# Patient Record
Sex: Male | Born: 1935 | Race: White | Hispanic: No | Marital: Married | Smoking: Never smoker
Health system: Southern US, Community
[De-identification: ages and names within clinical notes are randomized; demographics above are authoritative.]

## PROBLEM LIST (undated history)

## (undated) DIAGNOSIS — F0390 Unspecified dementia without behavioral disturbance: Secondary | ICD-10-CM

## (undated) DIAGNOSIS — J189 Pneumonia, unspecified organism: Secondary | ICD-10-CM

## (undated) DIAGNOSIS — I5022 Chronic systolic (congestive) heart failure: Secondary | ICD-10-CM

## (undated) DIAGNOSIS — I447 Left bundle-branch block, unspecified: Secondary | ICD-10-CM

## (undated) DIAGNOSIS — I4891 Unspecified atrial fibrillation: Secondary | ICD-10-CM

## (undated) HISTORY — DX: Pneumonia, unspecified organism: J18.9

## (undated) HISTORY — DX: Left bundle-branch block, unspecified: I44.7

## (undated) HISTORY — PX: AORTIC VALVE REPLACEMENT: SHX41

## (undated) HISTORY — PX: DOPPLER ECHOCARDIOGRAPHY: SHX263

---

## 2000-01-20 ENCOUNTER — Encounter: Payer: Self-pay | Admitting: Interventional Cardiology

## 2000-01-20 ENCOUNTER — Encounter: Admission: RE | Admit: 2000-01-20 | Discharge: 2000-01-20 | Payer: Self-pay | Admitting: Interventional Cardiology

## 2001-06-02 ENCOUNTER — Encounter: Admission: RE | Admit: 2001-06-02 | Discharge: 2001-06-02 | Payer: Self-pay | Admitting: Internal Medicine

## 2001-06-02 ENCOUNTER — Encounter: Payer: Self-pay | Admitting: Internal Medicine

## 2001-10-17 ENCOUNTER — Ambulatory Visit (HOSPITAL_COMMUNITY): Admission: RE | Admit: 2001-10-17 | Discharge: 2001-10-17 | Payer: Self-pay | Admitting: Chiropractor

## 2001-10-17 ENCOUNTER — Encounter: Payer: Self-pay | Admitting: Chiropractor

## 2003-01-25 ENCOUNTER — Ambulatory Visit (HOSPITAL_COMMUNITY): Admission: RE | Admit: 2003-01-25 | Discharge: 2003-01-25 | Payer: Self-pay | Admitting: Gastroenterology

## 2003-06-10 ENCOUNTER — Ambulatory Visit (HOSPITAL_COMMUNITY): Admission: RE | Admit: 2003-06-10 | Discharge: 2003-06-10 | Payer: Self-pay | Admitting: Internal Medicine

## 2004-12-19 ENCOUNTER — Ambulatory Visit (HOSPITAL_COMMUNITY): Admission: RE | Admit: 2004-12-19 | Discharge: 2004-12-19 | Payer: Self-pay | Admitting: Orthopedic Surgery

## 2005-12-24 ENCOUNTER — Ambulatory Visit (HOSPITAL_COMMUNITY): Admission: RE | Admit: 2005-12-24 | Discharge: 2005-12-24 | Payer: Self-pay | Admitting: Surgery

## 2005-12-24 ENCOUNTER — Encounter (INDEPENDENT_AMBULATORY_CARE_PROVIDER_SITE_OTHER): Payer: Self-pay | Admitting: Specialist

## 2006-06-14 ENCOUNTER — Inpatient Hospital Stay (HOSPITAL_COMMUNITY): Admission: EM | Admit: 2006-06-14 | Discharge: 2006-06-16 | Payer: Self-pay | Admitting: Emergency Medicine

## 2006-06-14 ENCOUNTER — Ambulatory Visit: Payer: Self-pay | Admitting: Internal Medicine

## 2006-06-15 ENCOUNTER — Encounter (INDEPENDENT_AMBULATORY_CARE_PROVIDER_SITE_OTHER): Payer: Self-pay | Admitting: Internal Medicine

## 2006-07-22 ENCOUNTER — Encounter: Admission: RE | Admit: 2006-07-22 | Discharge: 2006-07-22 | Payer: Self-pay | Admitting: Internal Medicine

## 2007-03-28 ENCOUNTER — Inpatient Hospital Stay (HOSPITAL_COMMUNITY): Admission: EM | Admit: 2007-03-28 | Discharge: 2007-04-02 | Payer: Self-pay | Admitting: Emergency Medicine

## 2007-03-28 ENCOUNTER — Ambulatory Visit: Payer: Self-pay | Admitting: Internal Medicine

## 2007-03-29 ENCOUNTER — Encounter (INDEPENDENT_AMBULATORY_CARE_PROVIDER_SITE_OTHER): Payer: Self-pay | Admitting: Cardiology

## 2007-04-19 ENCOUNTER — Ambulatory Visit: Payer: Self-pay

## 2007-05-03 ENCOUNTER — Encounter: Admission: RE | Admit: 2007-05-03 | Discharge: 2007-05-03 | Payer: Self-pay | Admitting: Internal Medicine

## 2008-05-23 ENCOUNTER — Encounter: Payer: Self-pay | Admitting: Internal Medicine

## 2008-11-22 ENCOUNTER — Encounter (INDEPENDENT_AMBULATORY_CARE_PROVIDER_SITE_OTHER): Payer: Self-pay | Admitting: *Deleted

## 2009-11-30 ENCOUNTER — Encounter (INDEPENDENT_AMBULATORY_CARE_PROVIDER_SITE_OTHER): Payer: Self-pay | Admitting: *Deleted

## 2010-02-05 NOTE — Letter (Signed)
Summary: Device-Delinquent Check  [facility], Main Office  [address]   [place], [place] [address]   Phone: [phone]  Fax: [phone]     November 30, 2009 MRN: [id number]   Jon Butler [address] [place], [place]  [address]   Dear Mr. Murad,  According to our records, you have not had your implanted device checked in the recommended period of time.  We are unable to determine appropriate device function without checking your device on a regular basis.  Please call our office to schedule an appointment, with Dr Ladona Ridgel,  as soon as possible.  If you are having your device checked by another physician, please call us so that we may update our records.  Thank you,  Letta Moynahan, EMT  November 30, 2009 12:21 PM  [facility]  certified

## 2010-05-21 NOTE — Cardiovascular Report (Signed)
Jon Butler, Jon Butler NO.:  [id number]   MEDICAL RECORD NO.:  [id number]          PATIENT TYPE:  INP   LOCATION:  3741                         FACILITY:  [facility]   PHYSICIAN:  Lyn Records, M.D.   DATE OF BIRTH:  06-Jul-1935   DATE OF PROCEDURE:  03/29/2007  DATE OF DISCHARGE:                            CARDIAC CATHETERIZATION   INDICATIONS FOR PROCEDURE:  Diagnostic left heart cath to document  coronary anatomy prior to insertion of biventricular pacer for  resynchronization and prophylactic AICD placement.  The patient  presented with clinical heart failure for the first time, known ejection  fraction 25%.   PROCEDURE PERFORMED:  Coronary angiography.   DESCRIPTION:  After informed consent a 6 French arterial sheath was  placed using a modified Seldinger technique.  A 6 French #4 left Judkins  catheter was used for left coronary angiography, a 6 [place] #4 right  Judkins catheter for right coronary angiography.  We paid special  attention to keep both the wire and the catheter tip above the  mechanical aortic valve.  In torquing of the right coronary catheter  there was selective engagement of the conus branch which had a separate  ostium.  We disengaged and then attempted cannulation of the right  coronary artery.  Upon injection which appeared to be of the aortic wall  we had rapid filling of the right coronary but there was an obvious  aortic wall contrast stain.  No flow disturbance into the conus branch  or the right coronary was noted.  The aortic root staining dissipated  within a minute or so of the contrast injection.  The patient  experienced no chest discomfort.  There was no contrast hang up in the  right coronary and over several minutes of observation in the cath lab  no change in rhythm or hemodynamics.  We awakened the patient and he was  not experiencing any chest pain.  We examined the patient and there were  no neurological complaints.   We  then turned our attention to the sheath and cath entry site.  A  sheathogram performed demonstrated adequate anatomy for percutaneous  closure.  We used Angio-Seal with good hemostasis.   RESULTS:  1. Hemodynamic data.      a.     Aortic pressure 111/71.      b.     Left ventricular pressure:  Not recorded.  2. Left ventriculography:  Not performed.  3. Coronary angiography.      a.     Left main coronary:  Widely patent.      b.     Left anterior descending coronary:  Heavy calcification in       the LAD with irregularities but the vessel is widely patent.  No       obstruction greater than 20%.  LAD wraps around the apex.  A small       to moderate-sized second diagonal arises from the LAD.  The LAD is       heavily calcified proximally.      c.     Ramus  intermedius branch:  Large ramus intermedius branch       arises from the distal left main and supplies the lateral wall.       This vessel is widely patent.      d.     Circumflex artery:  The circumflex coronary artery is       dominant and gives origin to three obtuse marginal branches.  The       first obtuse marginal branch is large and trifurcates.  The       proximal circumflex is also quite calcified.      e.     Right coronary:  The right coronary is nondominant.  There       is a separate ostium for the conus branch and for the right       coronary proper.  Our first pass at right coronary cannulation       resulted in selective engagement of the ramus branch.  We filled       this vessel with contrast but hand injection was not performed.       We then disengaged.  We next shot in what we thought was the right       coronary and ended up being a nonostial injection that resulted in       filling of the right coronary and staining of the periosteal       aortic wall around the right coronary os.  Flow into the right       coronary was brisk.  This actually cleared the periosteal aorta       within a minute or so after  the injection.  No symptoms were       associated, no ectopy was noted.  No EKG changes were noted.   CONCLUSION:  1. Widely patent left coronary and right coronary system.  2. Right coronary angiography was complicated by probable aortic root      and aortic root staining with right coronary injection without      hemodynamic compromise or evidence of decreased flow into the right      coronary.   PLAN:  Proceed with planned AICD and BiV pacer per Dr. Ladona Ridgel.  Resume  heparin in about 3 hours.      Lyn Records, M.D.  Electronically Signed     HWS/MEDQ  D:  03/31/2007  T:  03/31/2007  Job:  [id number]   cc:   Macarthur Critchley. Shelva Majestic, M.D.

## 2010-05-21 NOTE — H&P (Signed)
Jon Butler NO.:  [id number]   MEDICAL RECORD NO.:  [id number]          PATIENT TYPE:  EMS   LOCATION:  MAJO                         FACILITY:  [facility]   PHYSICIAN:  Francisca December, M.D.  DATE OF BIRTH:  1936/01/06   DATE OF ADMISSION:  03/28/2007  DATE OF DISCHARGE:                              HISTORY & PHYSICAL   REASON FOR ADMISSION:  Shortness of breath.   HISTORY OF PRESENT ILLNESS:  Jon Butler is a 75 year old man with  a long cardiac history dating to 36 when he underwent an aortic valve  replacement with a St. Jude mechanical device and  aortic root  replacement felt secondary to Marfan syndrome.  This was performed at  [facility]  He is on chronic Coumadin, and his last known LVEF was  25-35%, date of that is unknown.  Etiology is felt to be nonischemic.  He presents today with his wife and son complaining of 3-4 weeks of  progressive dyspnea, very much worse in the last 24 hours.  He had  paroxysmal nocturnal dyspnea last night, that is, he was awakened from  sleep with shortness of breath and had to sit upright.  He took a large  measure of cayenne pepper which seemed to improve things somewhat.  However, because of persistence of his symptoms worsening over several  weeks and chest discomfort, described below, he allowed his family to  bring him to the emergency room.   He also has a left-sided lower chest, sharp, aching pain which he refers  to as indigestion.  He has taken multiple doses of antacids for this  with partial relief at times.  He has no central or substernal  discomfort.  He has no pyrosis and no difficulty swallowing.   PAST MEDICAL HISTORY:  1. Long-term Coumadin use.  2. Chronic left bundle branch block.  3. History of cardiomyopathy, as mentioned.  4. Aortic valve replacement with a St Jude.   CURRENT MEDICATIONS:  1. Adjusted dose warfarin.  2. Captopril 50 mg p.o. t.i.d.  3. Carvedilol 25 mg p.o.  b.i.d.  4. Multiple vitamins daily.  5. Aspirin 81 mg p.o. daily.  6. Prescribed Lasix but has taken himself off.   ALLERGIES:  NO KNOWN DRUG ALLERGIES.   SOCIAL HISTORY:  He has 2-3 glasses of wine daily.  No tobacco use.  His  son is a International aid/development worker here at [facility].  He is married and accompanied by his wife as well in the  emergency room.   FAMILY HISTORY:  Mother died of brain aneurysm.  Father died of a  myocardial infarction at age 76.  Sister recently died suddenly as well.   REVIEW OF SYSTEMS:  Systems negative, except as mentioned above.   PHYSICAL EXAMINATION:  VITAL SIGNS:  Blood pressure 171/105, pulse is 72  and regular but with frequent extrasystole secondary to PAC, temperature  98.4, respiratory rate 18.  GENERAL:  A reasonably well-appearing 75 year old Caucasian man in no  distress.  HEENT:  Unremarkable.  Head is atraumatic, normocephalic.  Pupils equal,  round and reactive to light.  Extraocular movements are intact.  Sclerae  are anicteric.  Oral mucosa is pink and moist.  Teeth and gums in good  repair.  Tongue is not coated.  NECK:  Supple without thyromegaly or masses.  The carotid upstrokes are  normal.  There is no bruit.  I do not see JVD sitting upright.  CHEST:  Decreased breath sounds of bases with rhonchi in the left base.  No rales, adequate air movement.  HEART:  Regular rhythm with frequent extrasystole secondary to PAC.  There is a prominent S2 but no clear valve click.  Soft ejection  systolic murmur also present.  No gallop.  ABDOMEN:  Soft, nontender, even on deep palpation.  Bowel sounds present  in all quadrants.  Left chest is nontender.  EXTREMITIES:  Full range of motion.  No edema.  The right foot is  somewhat cooler and a bit dusky; however, there is an intact pulse,  posterior tibial bilaterally.  NEUROLOGIC:  Cranial nerves II-XII  intact.  Motor and sensory grossly intact.  Gait not tested.   SKIN:  Warm and dry.   LABORATORY DATA:  Serum sodium of 130, potassium of 4.9, CO2 of 25,  chloride 98, blood sugar 188, creatinine 0.9, BUN 14.  Admission  hemogram is normal.  INR is 4.4.  Cardiac enzymes, point-of-care x2 are  negative.   Chest x-ray shows cardiomegaly and moderate CHF.  EKG is chronic left  bundle branch block, sinus rhythm.   ASSESSMENT:  1. Dyspnea, likely acute-on-chronic systolic heart failure,      decompensated.  2. Cardiomyopathy ?, nonischemic versus ischemic.  No history of      coronary artery disease per patient.  3. Noncardiac chest pain.  4. Aortic valve replacement on Coumadin.  5. Chronic left bundle branch block.  6. Mild Coumadin toxicity.  7. Elevated fasting blood sugar.  8. Elevated blood pressure here in the emergency room.   PLAN:  1. The patient is admitted for telemetry monitoring and rule out      myocardial infarction which seems unlikely given the two negative      point-of-care enzymes thus far.  2. Will begin furosemide IV 40 mg q.12h.  3. Daily BMET and BNP.  4. Hold Coumadin.  5. Daily PT/INR.  6. 2-D echocardiogram with Doppler study.  7. Fasting lipids, glucose and hemoglobin A1c as well as TSH in the      a.m.  8. Repeat chest x-ray in 48 hours.  9. Strict I&O's, daily weights, 1500 mL per 24 hour fluid restriction  10.Jon Butler has a long history of a fair degree of denial with      regard to his cardiac diagnoses and problems.  He      has been encouraged to consider placement of an ICD with      biventricular pacemaker in the past.  Per his son, he has at least      class II exertional dyspnea.  The patient has declined this therapy      in the past but apparently is more amenable to the possibility at      the time of this admission.      Francisca December, M.D.  Electronically Signed     JHE/MEDQ  D:  03/28/2007  T:  03/28/2007  Job:  [id number]   cc:   Lyn Records, M.D.  Georgann Housekeeper, MD

## 2010-05-21 NOTE — Discharge Summary (Signed)
Jon Butler NO.:  [id number]   MEDICAL RECORD NO.:  [id number]          PATIENT TYPE:  INP   LOCATION:  3741                         FACILITY:  [facility]   PHYSICIAN:  Lyn Records, M.D.   DATE OF BIRTH:  12/28/1935   DATE OF ADMISSION:  03/28/2007  DATE OF DISCHARGE:  04/02/2007                               DISCHARGE SUMMARY   DISCHARGE DIAGNOSES:  1. Acute on chronic systolic heart failure, somewhat now nonacute      phase.  2. Dilated cardiomyopathy, ejection fraction 25%.  3. Class III congestive heart failure.  4. Left bundle branch block.  5. History of aortic root/valve replacement.  6. History of Marfan's in his family.   HOSPITAL COURSE:  [name] is a 75 year old male patient of Dr. Verdis Prime who was admitted on March 28, 2007, with acute exacerbation of his  chronic heart failure.  He has documented class III congestive heart  failure.  He has a history of aortic root/aortic valve (St. Jude)  replacement in 1990 and is on chronic Coumadin for this.  Apparently, he  has Marfan's and it runs in his family.   He was admitted with progressive shortness of breath, but denied any  chest pain.  Once his INR became subtherapeutic, we performed a cardiac  catheterization.  He was found to have nonobstructive coronary disease  and again this now leads to the diagnosis of nonischemic cardiomyopathy.   He was seen in consultation by Dr. Lewayne Bunting, for the implantation of  BiV ICD.  This was implanted for the indications of dilated  cardiomyopathy, class III CHF, and left bundle branch block.  The BiV  ICD was then successfully implanted on April 01, 2007, without  difficulty.  The patient's remainder of the hospital stay was  uncomplicated and he was discharged to home on April 02, 2007.   LAB STUDIES:  During the patient's hospitalization include; white count  6.2, hemoglobin 12.7, hematocrit 37.4, and platelets 130.  PT was 14.6  and INR  1.1 on last count.  On discharge day; sodium 138, potassium 3.8,  BUN 11, and creatinine 0.91.  Troponin was fairly elevated at 0.07 and  this was felt to be secondary to his huge volume status.  His BNP  maximized at 2652.0.  Total cholesterol 183, triglycerides 133, HDL 50,  and LDL 106.  TSH 1.812.   The patient is discharged to home in stable, but improved condition.   The patient is to have a protime and BMET on April 05, 2007.  Follow up  with [facility] for an incision check on April 19, 2007 at 9:00  a.m..  Follow up with Dr. Katrinka Blazing in 2 weeks.   Instruction sheets were given for the patient to discuss wound therapy  and activity.   DISCHARGE MEDICATIONS:  1. Lasix 40 mg a day.  2. Coumadin 5 mg a day.  3. Capoten 50 mg t.i.d.  4. Coreg 12.5 mg twice a day.  5. Potassium 20 mEq once daily.      Guy Franco, P.A.  Lyn Records, M.D.  Electronically Signed    LB/MEDQ  D:  04/27/2007  T:  04/28/2007  Job:  [id number]   cc:   Doylene Canning. Ladona Ridgel, MD  Lyn Records, M.D.

## 2010-05-21 NOTE — Discharge Summary (Signed)
Jon Butler, Jon Butler               ACCOUNT NO.:  [id number]   MEDICAL RECORD NO.:  [id number]          PATIENT TYPE:  INP   LOCATION:  4703                         FACILITY:  [facility]   PHYSICIAN:  Georgann Housekeeper, MD      DATE OF BIRTH:  02-17-1935   DATE OF ADMISSION:  06/13/2006  DATE OF DISCHARGE:  06/16/2006                               DISCHARGE SUMMARY   DISCHARGE DIAGNOSES:  1. Fever.  2. Atypical pneumonia, ruled out for septic pulmonary emboli.  3. Diarrhea, C-diff negative.   MEDICATIONS ON DISCHARGE:  1. Augmentin 875 mg b.i.d. for 2 weeks.  2. Coumadin as per the [facility].  3. Furosemide 40 mg daily.  4. Coreg 12.5 mg b.i.d.  5. Captopril 50 mg t.i.d.  6. Zetia 10 mg daily.   HOSPITAL COURSE:  The patient is 75 years old, admitted with fever and  cough.  His chest x-ray showed some atypical nodular patches.  CT scan  was done consistent with the atypical pneumonia.  No evidence of any  malignancy.  There was consideration of septic emboli.  The patient had  a 2D echocardiogram.  He has history of aortic valve replacement with no  evidence of any vegetation.  He remained afebrile.  White count was  normal.  The patient was started on Zosyn antibiotics and he continues  to improve.  The blood cultures remained negative.  The patient was  continued on Augmentin.  The TEE was postponed because of non-recurrent  fever.  The patient had some diarrhea in the hospital.  C-diff was  negative.  This resolved.  The patient will be continued on the  antibiotics for 2 weeks and then followup as an outpatient with me and  cardiology.  His Coumadin/INR remainder therapeutic and he will followup  as an outpatient.      Georgann Housekeeper, MD  Electronically Signed     KH/MEDQ  D:  07/16/2006  T:  07/16/2006  Job:  [id number]

## 2010-05-21 NOTE — Op Note (Signed)
NAMEJADIN, CREQUE NO.:  [id number]   MEDICAL RECORD NO.:  [id number]          PATIENT TYPE:  INP   LOCATION:  3741                         FACILITY:  [facility]   PHYSICIAN:  Doylene Canning. Ladona Ridgel, MD    DATE OF BIRTH:  July 15, 1935   DATE OF PROCEDURE:  DATE OF DISCHARGE:                               OPERATIVE REPORT   PROCEDURE PERFORMED:  Implantation of biventricular implantable  cardioverter-defibrillator.   INDICATIONS:  Nonischemic cardiomyopathy, class III heart failure, left  bundle branch block.   INTRODUCTION:  The patient is a very pleasant 75 year old male with a  history of longstanding dilated cardiomyopathy with progressive LV  dysfunction and an EF of between 20 and 25%.  He has left bundle branch  block with a QRS duration of 180 msec.  His heart failure has been  fairly stable but over the last week or two has increased despite  optimal medical therapy such that he presented to the hospital with  acute decompensated congestive heart failure.  Catheterization  demonstrated no occlusive coronary disease.  He is now referred for  biventricular ICD implantation secondary to all the above.   PROCEDURE:  After informed consent was obtained, the patient was taken  to the diagnostic EP lab in a fasting state.  After the usual  preparation and draping, intravenous fentanyl and midazolam were given  for sedation.  Lidocaine 30 mL was infiltrated in the left  infraclavicular region.  A 7-cm incision was carried out over this  region and electrocautery was utilized to dissect down to the fascial  plane.  The left subclavian vein was subsequently punctured x3 and a  Medtronic model [id number] Sprint Quattro Secure active-fixation  defibrillation lead serial number [id number], was advanced to the RV  septum.  At the final site the R-waves measured over 20 mV and the pace  impedance was 811 ohms, a threshold of 0.7 V at 0.5 msec.  Ten-volt  volts pacing did not  stimulate the diaphragm with the lead actively  fixed, and there was a large injury current demonstrated.  With this  satisfactory location, attention was turned to placement of the atrial  lead, which was placed in the anterolateral portion of the right atrium,  where P waves measured 2.4 mV and the pacing impedance was 500 ohms.  The threshold in the atrium was 0.8 V at 0.5 msec.  Again a nice injury  current was demonstrated and 10-volt pacing did not stimulate the  diaphragm.  With these satisfactory parameters, attention then turned  placement of the left ventricular lead.  The coronary sinus guiding  catheter along with a 6-French hexapolar EP catheter were advanced into  the right atrium and the coronary sinus was cannulated without  difficulty.  The patient had previously undergone catheterization  demonstrating two satisfactory veins, one a posterior vein which was  very large course at all way out to the LV apex, the second was a  lateral vein which was not quite as large.  Because of the increased  separation with the lateral vein, it was chosen for LV lead  placement.  A floppy guidewire was advanced into the lateral vein without particular  difficulty and the Medtronic model 4194 88-cm passive-fixation LV pacing  lead, serial number [id number], was advanced a distance halfway from  base to apex on the lateral wall of the left ventricle.  At this  location the pacing threshold in the LV was 1 V at 0.5 msec and the LV  waves measured 12 mV and the pacing impedance was 800 ohms.  Ten-volt  pacing did not stimulate the diaphragm in this location.  With these  satisfactory parameters, the leads were all secured to the subpectoralis  fascia with a figure-of-eight silk suture.  The sewing sleeve was also  secured with silk suture.  Electrocautery was utilized to make a  subcutaneous pocket.  Kanamycin irrigation was utilized to irrigate the  pocket and electrocautery was utilized to  assure hemostasis.  The  Medtronic Consulta CRTD, serial number [id number], was connected to the  atrial, RV and LV leads and then placed back in the subcutaneous pocket.  The generator was secured with silk suture.  Kanamycin irrigation was  utilized to irrigate the pocket and the incision closed with a layer of  2-0 Vicryl, followed by a layer of 3-0 Vicryl.  Defibrillation threshold  testing was then carried out.   After the patient was more deeply sedated with fentanyl and Versed, VF  was induced with T-wave shock and a 15-joule shock was delivered, which  terminated VF and restored sinus rhythm.  There were no dropouts at  least sensitivity.  At this point pressure was held, Benzoin and Steri-  Strips were placed on the skin and the patient was returned to his room  in satisfactory condition.   COMPLICATIONS:  There were no immediate complications.   RESULTS:  This demonstrates successful implantation of a Medtronic  biventricular ICD in a patient with a nonischemic cardiomyopathy,  congestive heart failure class III, left bundle branch block with a QRS  duration of 180 msec.108 msec.      Doylene Canning. Ladona Ridgel, MD  Electronically Signed     GWT/MEDQ  D:  04/01/2007  T:  04/01/2007  Job:  [id number]   cc:   Lyn Records, M.D.  Macarthur Critchley Shelva Majestic, M.D.

## 2010-05-24 NOTE — Op Note (Signed)
Jon Butler, Jon Butler               ACCOUNT NO.:  [id number]   MEDICAL RECORD NO.:  [id number]          PATIENT TYPE:  AMB   LOCATION:  DAY                          FACILITY:  [facility]   PHYSICIAN:  Vania Rea. Supple, M.D.  DATE OF BIRTH:  Aug 31, 1935   DATE OF PROCEDURE:  12/19/2004  DATE OF DISCHARGE:                                 OPERATIVE REPORT   PREOPERATIVE DIAGNOSES:  1.  Chronic right shoulder impingement syndrome.  2.  Right shoulder symptomatic acromioclavicular joint arthrosis.   POSTOPERATIVE DIAGNOSES:  1.  Chronic right shoulder impingement syndrome.  2.  Right shoulder symptomatic acromioclavicular joint arthrosis.  3.  Complex and extensive superior labral tear.  4.  Rotator cuff tear involving the distal supraspinatus.   PROCEDURE:  1.  Right shoulder examination under anesthesia.  2.  Right shoulder glenohumeral joint diagnostic arthroscopy.  3.  Debridement of complex and extensive degenerative labral tear.  4.  Arthroscopic subacromial decompression and bursectomy.  5.  Arthroscopic distal clavicle resection.  6.  Arthroscopic rotator cuff repair.   SURGEON:  Vania Rea. Supple, M.D.   Threasa HeadsFrench Ana A. Shuford, P.A.-C.   ANESTHESIA:  General endotracheal as well as a preoperative scalene block.   ESTIMATED BLOOD LOSS:  Minimal.   DRAINS:  None.   HISTORY:  Jon Butler is a 75 year old gentleman who has had chronic right  shoulder pain, weakness, limitations in motion with an examination showing  positive impingement sign as well as pain with testing the rotator cuff.  Preoperative x-rays and MRI scans confirmed AC joint arthrosis as well as  tendinosus of the rotator cuff and evidence for possible partial versus  complete rotator cuff tear.  Due to his ongoing pain and functional  limitations, he is brought to the operating room at this time for planned  right shoulder arthroscopy, as described below.   We counseled Jon Butler on treatment options as  well as risks versus  benefits thereof.  Possible surgical complications of bleeding, infection,  neurovascular injury, persistent pain, loss of motion, anesthetic  complication, recurrent rotator cuff tear, and possible need for additional  surgeries are reviewed.  He understands and accepts and agrees for planned  procedure.   In addition, Jon Butler is on chronic Coumadin therapy for a mechanical  heart valve.  We had cardiac clearance preoperatively and discontinuing his  Coumadin and did a Lovenox bridge preop and anticipate resuming Lovenox and  then put back on his Coumadin postoperatively.   PROCEDURE IN DETAIL:  After undergoing routine preoperative evaluation, the  patient is brought to the holding area and received preoperative antibiotics  and an interscalene block was established in the right upper extremity by  the anesthesia department.  The patient was then transferred to the  operating room, placed supine on the operating room table, and underwent  smooth induction of general endotracheal anesthesia.  Turned to the left  lateral decubitus position on a beanbag and appropriately padded and  protected.  Right shoulder examination under anesthesia showed full passive  and no evidence for instability.  The right arm suspended  at the 70/30  position with 10 pounds of traction.  The right shoulder girdle region was  then sterilely prepped and draped in the standard fashion.  A posterior  portal was established at the glenohumeral joint and anterior portal, and  anterior portal was established under direct visualization.  The  glenohumeral articular surfaces were in overall good condition.  There was  extensive degenerative tearing of the superior half of the labrum, and a  shaver was introduced and used to debride the labrum back to a stable  margin.  There was no obvious instability of the biceps anchor.  The biceps  tendon showed some tenosynovitis but no significant fraying  or attenuation  of the caliber of the tendon.  The rotator cuff involving the distal  supraspinatus showed a significant partial articular tear, which was  debrided with a shaver, and it appeared as though this actually passed  completely through into the bursa.  The posterior recess was unremarkable.  No obvious instability patterns.  The instruments were then removed at this  point.  The arm was dropped down to 30 degrees of abduction with the  arthroscope introduced into the subacromial space through the posterior  portal and a direct lateral portal was introduced into the subacromial  space.  Abundant proliferative bursal tissue was encountered, and this was  excised with a combination of the shaver and the Arthrex wand.  The wand was  then used to remove the periosteum from the undersurface from the anterior  half of the acromion and then a subacromial decompression was performed for  the bur, creating a type I morphology.  There was a very prominent,  inferiorly projecting spur on the anterior acromion, and this had caused  direct compression on the rotator cuff and caused significant fraying on the  bursal side of the distal supraspinatus.  A portal was then established  directly anterior to the distal clavicle, and a distal clavicle resection  was performed through the bur.  Care was taken to make sure that the entire  circumference of the distal clavicle could be visualized to insure adequate  removal of bone.  We then completed a subacromial bursectomy.  We probed the  bursal surface of the rotator cuff and identified the region which had been  noted on the articular side to have been significantly torn, and there was  just a few intact fibers on the bursal side.  These were divided, and then  the rotator cuff was debrided back to a stable margin with a shaver and  ultimately, the defect of approximately 1 cm in width was identified.  The greater tuberosity was abraded with a bur  to a bleeding bony base at the  site of the repair.  In the accessory portal, an __________ was then  established.  Through a stab wound off the lateral margin of the acromion,  placed an Arthrex biocorkscrew suture anchor.  The three limbs of the suture  anchor were then passed through the adjacent margin of the rotator cuff,  utilizing the Mitek suture retriever, creating horizontal mattress suture  constructs.  These were then tied with a sliding locking knot, followed by  multiple over-end throws and alternating posts.  This allowed excellent  reapposition of the rotator cuff margins and the bony bed and tuberosity.  Final inspection and irrigation was completed.  Fluid and instruments were  removed.  The deep portals were closed with Monocryl and Steri-Strip.  A  bulky dry dressing was  taped up.  The right shoulder and right arm were  placed in a sling, immobilized.  Patient was rolled supine and extubated.  Taken to the recovery room in stable condition.     Vania Rea. Supple, M.D.  Electronically Signed    KMS/MEDQ  D:  12/19/2004  T:  12/19/2004  Job:  [id number]

## 2010-05-24 NOTE — Op Note (Signed)
[name], [name]                         ACCOUNT NO.:  [id number]   MEDICAL RECORD NO.:  [id number]                   PATIENT TYPE:  AMB   LOCATION:  ENDO                                 [facility]ACILITY:  [facility]   PHYSICIAN:  Danise Edge, M.D.                DATE OF BIRTH:  1935/05/28   DATE OF PROCEDURE:  01/25/2003  DATE OF DISCHARGE:                                 OPERATIVE REPORT   PROCEDURE:  Diagnostic colonoscopy.   PROCEDURE INDICATION:  [name] is a 75 year old male born  Feb 22, 1935.  [name] sister was diagnosed with colon cancer  last year.  He had an episode of painless hematochezia, which resolved after  using Anusol suppositories.  He is scheduled to undergo his first diagnostic  colonoscopy with polypectomy to prevent colon cancer.   ENDOSCOPIST:  Danise Edge, M.D.   PREMEDICATION:  Versed 10 mg, Demerol 50 mg.   DESCRIPTION OF PROCEDURE:  After obtaining informed consent, [name] was  placed in the left lateral decubitus position.  I administered intravenous  Demerol and intravenous Versed to achieve conscious sedation for the  procedure.  The patient's blood pressure, oxygen saturation, and cardiac  rhythm were monitored throughout the procedure and documented in the medical  record.   Anal inspection was normal.  Digital rectal exam revealed a non-nodular  prostate.  The Olympus adjustable pediatric colonoscope was introduced into  the rectum and advanced to the cecum.  [name] has universal colonic  diverticulosis.  Colonic preparation using the Miralax-GoLYTELY colonic  lavage prep was only satisfactory.  With future colonoscopies he should  receive the gallon of GoLYTELY colonic lavage prep.   Rectum normal.   Sigmoid colon and descending colon normal.   Splenic flexure normal.   Transverse colon normal.   Hepatic flexure normal.   Ascending colon normal.   Cecum and ileocecal valve normal.   ASSESSMENT:   Universal colonic diverticulosis; otherwise normal  proctocolonoscopy to the cecum.  No endoscopic evidence for the presence of  colorectal neoplasia.   RECOMMENDATIONS:  1. Resume Lovenox.  2. Resume Coumadin 5 mg daily.  3. Repeat colonoscopy in five years.  4. Amoxicillin 500 mg at 4 p.m. today.  5. With next colonoscopy, use the gallon colonic GoLYTELY lavage prep.                                               Danise Edge, M.D.    MJ/MEDQ  D:  01/25/2003  T:  01/25/2003  Job:  [id number]   cc:   Lesleigh Noe, M.D.  [address]  [address]  [place]  [place] [address]  Fax: [phone]

## 2010-05-24 NOTE — Op Note (Signed)
Jon Butler, Jon Butler               ACCOUNT NO.:  [id number]   MEDICAL RECORD NO.:  [id number]          PATIENT TYPE:  AMB   LOCATION:  SDS                          FACILITY:  [facility]   PHYSICIAN:  Currie Paris, M.D.DATE OF BIRTH:  [birth year]   DATE OF PROCEDURE:  12/24/2005  DATE OF DISCHARGE:                               OPERATIVE REPORT   OFFICE MEDICAL RECORD NUMBER [id number].   PREOPERATIVE PROGNOSIS:  Right inguinal hernia.   POSTOPERATIVE DIAGNOSIS:  Right inguinal hernia - indirect and direct.   OPERATION:  Repair with mesh.   SURGEON:  Currie Paris, M.D.   ANESTHESIA:  MAC.   CLINICAL HISTORY:  Jon Butler is a 75 year old gentleman with a right  inguinal hernia he desired to have repaired.  He has an aortic valve and  has been on Coumadin so he was switched to Lovenox perioperatively by  his cardiologist.   DESCRIPTION OF PROCEDURE:  The patient was seen in the holding area and  he had no further questions.  He and I confirmed that the right inguinal  hernia was the operative site and the area was marked by me with patient  confirmation.   The patient was taken to the operating room and after satisfactory IV  sedation had been obtained, the groin area was clipped, prepped and  draped.  The time-out occurred.   A combination of 1% Xylocaine with epinephrine and 0.5% plain Marcaine  was mixed equally and used for local.  I infiltrated along the skin line  and then subcutaneously at right angles to that to try to do a field  block.  I then infiltrated above the anterior superior iliac spine  trying to place the local below the external oblique.   Incision was then made and deepened to the external oblique aponeurosis  with bleeders either tied or coagulated.  The external oblique was  exposed and the superficial ring identified.   The external oblique was opened in the line of its fibers into the  superficial ring and elevated off the underlying tissues.   The  ilioinguinal nerve was found and cleared off of the cord and retracted  superiorly.  The cord was then dissected off the inguinal floor and  surrounded with a Penrose drain.   I was able to identify a fairly large indirect sac in the anteromedial  portion of the cord and this was opened, stripped off and suture ligated  at its neck.  Once it was amputated, it retracted under the deep ring.   Inspection of the inguinal floor showed this to be basically attenuated  with preperitoneal fat protruding through.  I think this was consistent  with a diffuse direct defect.   Once I had this identified and the tissues cleaned off, I took a piece  of 3 x 6 inch Marlex mesh and trimmed it to fit with it tapered at the  medial aspect.  This was sutured in with a running 2-0 Prolene starting  at the pubic tubercle and running inferolaterally until I got to the  deep ring.  It was then  tacked well medially over the internal oblique.  It was split laterally to go around the cord and lay down nicely  allowing the nerve to come through the split that I made in it.   I irrigated and made sure everything was dry and it appeared so.  The  external oblique was closed over the repair with 3-0 Vicryl.  I  irrigated again and checked for hemostasis and again everything appeared  to be dry.  The Scarpa's was closed with 3-0 Vicryl and the skin with a  running 4-0 Monocryl subcuticular and some Dermabond.   The patient tolerated the procedure well.  There were no operative  complications.  All counts were correct.      Currie Paris, M.D.  Electronically Signed     CJS/MEDQ  D:  12/24/2005  T:  12/24/2005  Job:  [id number]   cc:   Georgann Housekeeper, MD  Lyn Records, M.D.

## 2010-09-30 LAB — BASIC METABOLIC PANEL
BUN: 11
BUN: 18
CO2: 28
CO2: 30
CO2: 32
CO2: 34 — ABNORMAL HIGH
Calcium: 8.5
Calcium: 8.6
Calcium: 8.6
Chloride: 100
Chloride: 102
Chloride: 103
Chloride: 105
Creatinine, Ser: 0.93
Creatinine, Ser: 0.98
Creatinine, Ser: 1.02
GFR calc Af Amer: >60
GFR calc Af Amer: >60
GFR calc Af Amer: >60
GFR calc Af Amer: >60
GFR calc non Af Amer: >60
GFR calc non Af Amer: >60
Glucose, Bld: 103 — ABNORMAL HIGH
Glucose, Bld: 107 — ABNORMAL HIGH
Glucose, Bld: 96
Potassium: 3.7
Potassium: 3.8
Potassium: 4.2
Sodium: 138
Sodium: 140
Sodium: 140

## 2010-09-30 LAB — POCT CARDIAC MARKERS
Myoglobin, poc: 42.2
Troponin i, poc: <0.05

## 2010-09-30 LAB — DIFFERENTIAL
Basophils Absolute: 0
Basophils Relative: 0
Eosinophils Absolute: 0
Eosinophils Relative: 0
Lymphocytes Relative: 11 — ABNORMAL LOW
Lymphs Abs: 1
Monocytes Absolute: 0.7
Monocytes Relative: 7
Neutro Abs: 7.3
Neutrophils Relative %: 81 — ABNORMAL HIGH

## 2010-09-30 LAB — HEPARIN LEVEL (UNFRACTIONATED): Heparin Unfractionated: <0.1 — ABNORMAL LOW

## 2010-09-30 LAB — PROTIME-INR
INR: 1.1
INR: 2.4 — ABNORMAL HIGH

## 2010-09-30 LAB — CK TOTAL AND CKMB (NOT AT ARMC)
CK, MB: 4.8 — ABNORMAL HIGH
Relative Index: 2.3
Relative Index: 2.4

## 2010-09-30 LAB — B-NATRIURETIC PEPTIDE (CONVERTED LAB)
Pro B Natriuretic peptide (BNP): 1446 — ABNORMAL HIGH
Pro B Natriuretic peptide (BNP): 1995 — ABNORMAL HIGH
Pro B Natriuretic peptide (BNP): 2652 — ABNORMAL HIGH

## 2010-09-30 LAB — CBC
HCT: 37.4 — ABNORMAL LOW
HCT: 40.4
Hemoglobin: 12.7 — ABNORMAL LOW
Hemoglobin: 13.8
MCHC: 34
MCHC: 34.2
MCV: 94.7
MCV: 96.2
Platelets: 148 — ABNORMAL LOW
RBC: 3.89 — ABNORMAL LOW
RBC: 4.26
RDW: 15.2
WBC: 8.9

## 2010-09-30 LAB — LIPID PANEL
HDL: 50
Total CHOL/HDL Ratio: 3.7
Triglycerides: 133

## 2010-09-30 LAB — COMPREHENSIVE METABOLIC PANEL
ALT: 155 — ABNORMAL HIGH
Albumin: 3.6
Alkaline Phosphatase: 87
BUN: 14
CO2: 25
Chloride: 98
Creatinine, Ser: 0.93
Sodium: 130 — ABNORMAL LOW

## 2010-09-30 LAB — TSH: TSH: 1.812

## 2010-10-24 LAB — CBC
HCT: 33.3 — ABNORMAL LOW
Hemoglobin: 11.5 — ABNORMAL LOW
MCHC: 33.8
Platelets: 133 — ABNORMAL LOW
RBC: 3.71 — ABNORMAL LOW
RBC: 4.29
WBC: 11.9 — ABNORMAL HIGH
WBC: 7.9

## 2010-10-24 LAB — CLOSTRIDIUM DIFFICILE EIA
C difficile Toxins A+B, EIA: NEGATIVE
C difficile Toxins A+B, EIA: NEGATIVE

## 2010-10-24 LAB — I-STAT 8, (EC8 V) (CONVERTED LAB)
Acid-Base Excess: 2
Potassium: 4.4
TCO2: 28
pCO2, Ven: 38.8 — ABNORMAL LOW
pH, Ven: 7.442 — ABNORMAL HIGH

## 2010-10-24 LAB — CULTURE, BLOOD (ROUTINE X 2): Culture: NO GROWTH

## 2010-10-24 LAB — BASIC METABOLIC PANEL
BUN: 5 — ABNORMAL LOW
Chloride: 107
GFR calc Af Amer: >60
GFR calc non Af Amer: >60
Potassium: 3.8
Sodium: 141

## 2010-10-24 LAB — RAPID STREP SCREEN (MED CTR MEBANE ONLY): Streptococcus, Group A Screen (Direct): NEGATIVE

## 2010-10-24 LAB — PROTIME-INR
INR: 1.7 — ABNORMAL HIGH
Prothrombin Time: 20.5 — ABNORMAL HIGH

## 2010-10-24 LAB — SEDIMENTATION RATE: Sed Rate: 23 — ABNORMAL HIGH

## 2010-11-06 ENCOUNTER — Encounter: Payer: Self-pay | Admitting: Internal Medicine

## 2010-11-06 ENCOUNTER — Ambulatory Visit (INDEPENDENT_AMBULATORY_CARE_PROVIDER_SITE_OTHER): Payer: Medicare Other | Admitting: Internal Medicine

## 2010-11-06 DIAGNOSIS — Z95810 Presence of automatic (implantable) cardiac defibrillator: Secondary | ICD-10-CM

## 2010-11-06 DIAGNOSIS — I472 Ventricular tachycardia, unspecified: Secondary | ICD-10-CM

## 2010-11-06 DIAGNOSIS — I428 Other cardiomyopathies: Secondary | ICD-10-CM

## 2010-11-06 DIAGNOSIS — I5022 Chronic systolic (congestive) heart failure: Secondary | ICD-10-CM

## 2010-11-06 DIAGNOSIS — I509 Heart failure, unspecified: Secondary | ICD-10-CM

## 2010-11-06 LAB — ICD DEVICE OBSERVATION
AL IMPEDENCE ICD: 494 Ohm
AL THRESHOLD: 0.75 V
BAMS-0001: 170 {beats}/min
BATTERY VOLTAGE: 2.9923 V
PACEART VT: 0
RV LEAD AMPLITUDE: 20 mv
TOT-0002: 0
TOT-0006: 20090326000000
TZAT-0001ATACH: 3
TZAT-0001FASTVT: 1
TZAT-0001SLOWVT: 1
TZAT-0002ATACH: NEGATIVE
TZAT-0002ATACH: NEGATIVE
TZAT-0002ATACH: NEGATIVE
TZAT-0002FASTVT: NEGATIVE
TZAT-0012ATACH: 150 ms
TZAT-0012FASTVT: 200 ms
TZAT-0012SLOWVT: 200 ms
TZAT-0012SLOWVT: 200 ms
TZAT-0018ATACH: NEGATIVE
TZAT-0018FASTVT: NEGATIVE
TZAT-0018SLOWVT: NEGATIVE
TZAT-0019ATACH: 6 V
TZAT-0019ATACH: 6 V
TZAT-0019ATACH: 6 V
TZAT-0020ATACH: 1.5 ms
TZAT-0020SLOWVT: 1.5 ms
TZST-0001ATACH: 5
TZST-0001ATACH: 6
TZST-0001FASTVT: 4
TZST-0001FASTVT: 6
TZST-0001SLOWVT: 3
TZST-0001SLOWVT: 5
TZST-0002ATACH: NEGATIVE
TZST-0002ATACH: NEGATIVE
TZST-0002FASTVT: NEGATIVE
TZST-0002FASTVT: NEGATIVE
TZST-0002SLOWVT: NEGATIVE
VF: 0

## 2010-11-06 NOTE — Assessment & Plan Note (Signed)
His symptoms are class II. Review of his fluid index monitoring demonstrates intermittent elevations which appear to be asymptomatic. I have encouraged him to maintain a low-sodium diet and take his medications as directed.

## 2010-11-06 NOTE — Patient Instructions (Signed)
Your physician wants you to follow-up in: 1 year with Dr. Ladona Ridgel. You will receive a reminder letter in the mail two months in advance. If you don't receive a letter, please call our office to schedule the follow-up appointment.  Remote monitoring is used to monitor your Pacemaker of ICD from home. This monitoring reduces the number of office visits required to check your device to one time per year. It allows [place] to keep an eye on the functioning of your device to ensure it is working properly. You are scheduled for a device check from home on 02/06/11. You may send your transmission at any time that day. If you have a wireless device, the transmission will be sent automatically. After your physician reviews your transmission, you will receive a postcard with your next transmission date.  Your physician recommends that you continue on your current medications as directed. Please refer to the Current Medication list given to you today.

## 2010-11-06 NOTE — Progress Notes (Signed)
HPI Jon Butler returns today for followup. He is a 75 year old man with a history of nonischemic cardiomyopathy status post aortic valve replacement. The patient underwent biventricular ICD implantation several years ago. He has done well in the interim. He has class 1-2 heart failure symptoms. He denies chest pain, shortness of breath, or syncope. He had no specific complaints today. He remains active and notes that he exercises several times a week with no limitation. No Known Allergies   Current Outpatient Prescriptions  Medication Sig Dispense Refill  . allopurinol (ZYLOPRIM) 300 MG tablet Take 300 mg by mouth daily.        Marland Kitchen amoxicillin (AMOXIL) 500 MG capsule Take 500 mg by mouth every other day.        Marland Kitchen aspirin 81 MG tablet Take 81 mg by mouth. 1-4 tablets a week        . captopril (CAPOTEN) 50 MG tablet Take 50 mg by mouth 3 (three) times daily.        . carvedilol (COREG) 25 MG tablet Take 25 mg by mouth 2 (two) times daily with a meal.        . co-enzyme Q-10 30 MG capsule Take 30 mg by mouth 3 (three) times daily.        . Coenzyme Q10 (CO Q-10 PO) Take by mouth daily.        . furosemide (LASIX) 40 MG tablet Take 40 mg by mouth daily.        . Multiple Vitamins-Minerals (MULTIVITAMIN WITH MINERALS) tablet Take 1 tablet by mouth daily.        . Red Yeast Rice Extract (RED YEAST RICE PO) Take by mouth daily.        . rosuvastatin (CRESTOR) 10 MG tablet Take 10 mg by mouth daily.        . traZODone (DESYREL) 50 MG tablet Take 50 mg by mouth at bedtime.        Marland Kitchen warfarin (COUMADIN) 5 MG tablet Take 5 mg by mouth as directed.           Past Medical History  Diagnosis Date  . Left bundle-branch block   . Cardiomyopathy     ROS:   All systems reviewed and negative except as noted in the HPI.   Past Surgical History  Procedure Date  . Doppler echocardiography 2008, 2009  . Aortic valve replacement      Family History  Problem Relation Age of Onset  . Aneurysm Mother       brain  . Heart attack Father 61     History   Social History  . Marital Status: Married    Spouse Name: N/A    Number of Children: N/A  . Years of Education: N/A   Occupational History  . Not on file.   Social History Main Topics  . Smoking status: Never Smoker   . Smokeless tobacco: Not on file  . Alcohol Use: Yes     2-3 glasses  . Drug Use: Not on file  . Sexually Active: Not on file   Other Topics Concern  . Not on file   Social History Narrative  . No narrative on file     BP 118/76  Pulse 62  Ht 5\' 10"  (1.778 m)  Wt 209 lb 10.5 oz (95.1 kg)  BMI 30.08 kg/m2  Physical Exam:  Well appearing NAD HEENT: Unremarkable Neck:  No JVD, no thyromegally Lymphatics:  No adenopathy Back:  No CVA tenderness Lungs:  Clear with no wheezes, rales, or rhonchi. Well-healed ICD incision. HEART:  Regular rate rhythm, no murmurs, no rubs, no clicks Abd:  soft, positive bowel sounds, no organomegally, no rebound, no guarding Ext:  2 plus pulses, no edema, no cyanosis, no clubbing Skin:  No rashes no nodules Neuro:  CN II through XII intact, motor grossly intact  DEVICE  Normal device function.  See PaceArt for details.   Assess/Plan:

## 2010-11-06 NOTE — Assessment & Plan Note (Signed)
The patient has been asymptomatic. Interrogation of his defibrillator demonstrates that he has nonsustained VT. He will undergo a period of watchful waiting at this time. If he develops symptomatic VT then antiarrhythmic drug therapy would be a consideration

## 2011-01-14 DIAGNOSIS — Z7901 Long term (current) use of anticoagulants: Secondary | ICD-10-CM | POA: Diagnosis not present

## 2011-01-22 DIAGNOSIS — I5022 Chronic systolic (congestive) heart failure: Secondary | ICD-10-CM | POA: Diagnosis not present

## 2011-01-22 DIAGNOSIS — I429 Cardiomyopathy, unspecified: Secondary | ICD-10-CM | POA: Diagnosis not present

## 2011-01-22 DIAGNOSIS — Z95810 Presence of automatic (implantable) cardiac defibrillator: Secondary | ICD-10-CM | POA: Diagnosis not present

## 2011-01-22 DIAGNOSIS — Z954 Presence of other heart-valve replacement: Secondary | ICD-10-CM | POA: Diagnosis not present

## 2011-01-22 DIAGNOSIS — Z7901 Long term (current) use of anticoagulants: Secondary | ICD-10-CM | POA: Diagnosis not present

## 2011-01-22 DIAGNOSIS — I428 Other cardiomyopathies: Secondary | ICD-10-CM | POA: Diagnosis not present

## 2011-02-06 ENCOUNTER — Encounter: Payer: BLUE CROSS/BLUE SHIELD | Admitting: *Deleted

## 2011-02-10 ENCOUNTER — Encounter: Payer: Self-pay | Admitting: *Deleted

## 2011-02-11 DIAGNOSIS — Z7901 Long term (current) use of anticoagulants: Secondary | ICD-10-CM | POA: Diagnosis not present

## 2011-02-11 DIAGNOSIS — R7309 Other abnormal glucose: Secondary | ICD-10-CM | POA: Diagnosis not present

## 2011-02-11 DIAGNOSIS — I10 Essential (primary) hypertension: Secondary | ICD-10-CM | POA: Diagnosis not present

## 2011-02-11 DIAGNOSIS — E782 Mixed hyperlipidemia: Secondary | ICD-10-CM | POA: Diagnosis not present

## 2011-02-11 DIAGNOSIS — I509 Heart failure, unspecified: Secondary | ICD-10-CM | POA: Diagnosis not present

## 2011-02-11 DIAGNOSIS — Z95810 Presence of automatic (implantable) cardiac defibrillator: Secondary | ICD-10-CM | POA: Diagnosis not present

## 2011-02-17 ENCOUNTER — Encounter: Payer: Self-pay | Admitting: Internal Medicine

## 2011-02-17 ENCOUNTER — Ambulatory Visit (INDEPENDENT_AMBULATORY_CARE_PROVIDER_SITE_OTHER): Payer: Medicare Other | Admitting: *Deleted

## 2011-02-17 DIAGNOSIS — I5022 Chronic systolic (congestive) heart failure: Secondary | ICD-10-CM | POA: Diagnosis not present

## 2011-02-17 DIAGNOSIS — Z7901 Long term (current) use of anticoagulants: Secondary | ICD-10-CM | POA: Diagnosis not present

## 2011-02-17 DIAGNOSIS — I472 Ventricular tachycardia: Secondary | ICD-10-CM

## 2011-02-23 LAB — REMOTE ICD DEVICE
AL AMPLITUDE: 0.6 mv
AL IMPEDENCE ICD: 494 Ohm
AL THRESHOLD: 0.625 v
ATRIAL PACING ICD: 5.17 pct
BAMS-0001: 170 {beats}/min
BATTERY VOLTAGE: 2.9719 v
CHARGE TIME: 9.899 s
FVT: 0
LV LEAD IMPEDENCE ICD: 779 Ohm
LV LEAD THRESHOLD: 1 v
PACEART VT: 0
RV LEAD AMPLITUDE: 31.625 mv
RV LEAD IMPEDENCE ICD: 551 Ohm
RV LEAD THRESHOLD: 0.625 v
TOT-0001: 1
TOT-0002: 0
TOT-0006: 20090326000000
TZAT-0001ATACH: 1
TZAT-0001ATACH: 2
TZAT-0001ATACH: 3
TZAT-0001FASTVT: 1
TZAT-0001SLOWVT: 1
TZAT-0001SLOWVT: 2
TZAT-0002ATACH: NEGATIVE
TZAT-0002ATACH: NEGATIVE
TZAT-0002ATACH: NEGATIVE
TZAT-0002FASTVT: NEGATIVE
TZAT-0002SLOWVT: NEGATIVE
TZAT-0002SLOWVT: NEGATIVE
TZAT-0012ATACH: 150 ms
TZAT-0012ATACH: 150 ms
TZAT-0012ATACH: 150 ms
TZAT-0012FASTVT: 200 ms
TZAT-0012SLOWVT: 200 ms
TZAT-0012SLOWVT: 200 ms
TZAT-0018ATACH: NEGATIVE
TZAT-0018ATACH: NEGATIVE
TZAT-0018ATACH: NEGATIVE
TZAT-0018FASTVT: NEGATIVE
TZAT-0018SLOWVT: NEGATIVE
TZAT-0018SLOWVT: NEGATIVE
TZAT-0019ATACH: 6 v
TZAT-0019ATACH: 6 v
TZAT-0019ATACH: 6 v
TZAT-0019FASTVT: 8 v
TZAT-0019SLOWVT: 8 v
TZAT-0019SLOWVT: 8 v
TZAT-0020ATACH: 1.5 ms
TZAT-0020ATACH: 1.5 ms
TZAT-0020ATACH: 1.5 ms
TZAT-0020FASTVT: 1.5 ms
TZAT-0020SLOWVT: 1.5 ms
TZAT-0020SLOWVT: 1.5 ms
TZON-0003ATACH: 350 ms
TZON-0003SLOWVT: 340 ms
TZON-0003VSLOWVT: 400 ms
TZON-0004SLOWVT: 24
TZON-0004VSLOWVT: 32
TZON-0005SLOWVT: 16
TZST-0001ATACH: 4
TZST-0001ATACH: 5
TZST-0001ATACH: 6
TZST-0001FASTVT: 2
TZST-0001FASTVT: 3
TZST-0001FASTVT: 4
TZST-0001FASTVT: 5
TZST-0001FASTVT: 6
TZST-0001SLOWVT: 3
TZST-0001SLOWVT: 4
TZST-0001SLOWVT: 5
TZST-0001SLOWVT: 6
TZST-0002ATACH: NEGATIVE
TZST-0002ATACH: NEGATIVE
TZST-0002ATACH: NEGATIVE
TZST-0002FASTVT: NEGATIVE
TZST-0002FASTVT: NEGATIVE
TZST-0002FASTVT: NEGATIVE
TZST-0002FASTVT: NEGATIVE
TZST-0002FASTVT: NEGATIVE
TZST-0002SLOWVT: NEGATIVE
TZST-0002SLOWVT: NEGATIVE
TZST-0002SLOWVT: NEGATIVE
TZST-0002SLOWVT: NEGATIVE
VENTRICULAR PACING ICD: 98.91 pct
VF: 0

## 2011-02-24 DIAGNOSIS — Z7901 Long term (current) use of anticoagulants: Secondary | ICD-10-CM | POA: Diagnosis not present

## 2011-02-28 NOTE — Progress Notes (Signed)
ICD remote with ICM 

## 2011-03-03 DIAGNOSIS — Z7901 Long term (current) use of anticoagulants: Secondary | ICD-10-CM | POA: Diagnosis not present

## 2011-03-05 ENCOUNTER — Encounter: Payer: Self-pay | Admitting: *Deleted

## 2011-03-10 DIAGNOSIS — Z7901 Long term (current) use of anticoagulants: Secondary | ICD-10-CM | POA: Diagnosis not present

## 2011-03-17 DIAGNOSIS — Z7901 Long term (current) use of anticoagulants: Secondary | ICD-10-CM | POA: Diagnosis not present

## 2011-03-25 DIAGNOSIS — Z7901 Long term (current) use of anticoagulants: Secondary | ICD-10-CM | POA: Diagnosis not present

## 2011-03-28 DIAGNOSIS — H2510 Age-related nuclear cataract, unspecified eye: Secondary | ICD-10-CM | POA: Diagnosis not present

## 2011-04-02 DIAGNOSIS — Z7901 Long term (current) use of anticoagulants: Secondary | ICD-10-CM | POA: Diagnosis not present

## 2011-04-05 DIAGNOSIS — S93409A Sprain of unspecified ligament of unspecified ankle, initial encounter: Secondary | ICD-10-CM | POA: Diagnosis not present

## 2011-04-07 DIAGNOSIS — H2510 Age-related nuclear cataract, unspecified eye: Secondary | ICD-10-CM | POA: Diagnosis not present

## 2011-04-14 DIAGNOSIS — H2510 Age-related nuclear cataract, unspecified eye: Secondary | ICD-10-CM | POA: Diagnosis not present

## 2011-04-14 DIAGNOSIS — H27119 Subluxation of lens, unspecified eye: Secondary | ICD-10-CM | POA: Diagnosis not present

## 2011-04-22 DIAGNOSIS — Z7901 Long term (current) use of anticoagulants: Secondary | ICD-10-CM | POA: Diagnosis not present

## 2011-04-28 DIAGNOSIS — H2510 Age-related nuclear cataract, unspecified eye: Secondary | ICD-10-CM | POA: Diagnosis not present

## 2011-04-28 DIAGNOSIS — H5700 Unspecified anomaly of pupillary function: Secondary | ICD-10-CM | POA: Diagnosis not present

## 2011-04-28 DIAGNOSIS — H27119 Subluxation of lens, unspecified eye: Secondary | ICD-10-CM | POA: Diagnosis not present

## 2011-05-22 ENCOUNTER — Ambulatory Visit (INDEPENDENT_AMBULATORY_CARE_PROVIDER_SITE_OTHER): Payer: Medicare Other | Admitting: *Deleted

## 2011-05-22 DIAGNOSIS — Z95810 Presence of automatic (implantable) cardiac defibrillator: Secondary | ICD-10-CM

## 2011-05-22 DIAGNOSIS — I472 Ventricular tachycardia, unspecified: Secondary | ICD-10-CM

## 2011-05-22 DIAGNOSIS — I5022 Chronic systolic (congestive) heart failure: Secondary | ICD-10-CM

## 2011-05-23 ENCOUNTER — Encounter: Payer: Self-pay | Admitting: Internal Medicine

## 2011-05-26 LAB — REMOTE ICD DEVICE
AL THRESHOLD: 0.75 V
BAMS-0001: 170 {beats}/min
BATTERY VOLTAGE: 2.9241 V
CHARGE TIME: 10.079 s
FVT: 0
LV LEAD THRESHOLD: 0.75 V
PACEART VT: 0
RV LEAD AMPLITUDE: 7 mv
RV LEAD THRESHOLD: 0.625 V
TZAT-0001ATACH: 1
TZAT-0001ATACH: 2
TZAT-0002ATACH: NEGATIVE
TZAT-0002ATACH: NEGATIVE
TZAT-0012ATACH: 150 ms
TZAT-0012FASTVT: 200 ms
TZAT-0012SLOWVT: 200 ms
TZAT-0012SLOWVT: 200 ms
TZAT-0018ATACH: NEGATIVE
TZAT-0018ATACH: NEGATIVE
TZAT-0018FASTVT: NEGATIVE
TZAT-0019ATACH: 6 V
TZAT-0019ATACH: 6 V
TZAT-0019FASTVT: 8 V
TZAT-0020FASTVT: 1.5 ms
TZAT-0020SLOWVT: 1.5 ms
TZAT-0020SLOWVT: 1.5 ms
TZON-0003SLOWVT: 340 ms
TZON-0004VSLOWVT: 32
TZST-0001FASTVT: 2
TZST-0001FASTVT: 4
TZST-0001FASTVT: 6
TZST-0001SLOWVT: 3
TZST-0002ATACH: NEGATIVE
TZST-0002FASTVT: NEGATIVE
TZST-0002FASTVT: NEGATIVE
TZST-0002SLOWVT: NEGATIVE
TZST-0002SLOWVT: NEGATIVE
VENTRICULAR PACING ICD: 98.87 pct

## 2011-06-03 DIAGNOSIS — Z7901 Long term (current) use of anticoagulants: Secondary | ICD-10-CM | POA: Diagnosis not present

## 2011-06-09 ENCOUNTER — Encounter: Payer: Self-pay | Admitting: *Deleted

## 2011-06-10 DIAGNOSIS — Z7901 Long term (current) use of anticoagulants: Secondary | ICD-10-CM | POA: Diagnosis not present

## 2011-06-17 DIAGNOSIS — Z7901 Long term (current) use of anticoagulants: Secondary | ICD-10-CM | POA: Diagnosis not present

## 2011-07-15 DIAGNOSIS — Z7901 Long term (current) use of anticoagulants: Secondary | ICD-10-CM | POA: Diagnosis not present

## 2011-07-29 DIAGNOSIS — Z7901 Long term (current) use of anticoagulants: Secondary | ICD-10-CM | POA: Diagnosis not present

## 2011-07-31 DIAGNOSIS — Z7901 Long term (current) use of anticoagulants: Secondary | ICD-10-CM | POA: Diagnosis not present

## 2011-08-07 DIAGNOSIS — Z7901 Long term (current) use of anticoagulants: Secondary | ICD-10-CM | POA: Diagnosis not present

## 2011-08-19 DIAGNOSIS — I10 Essential (primary) hypertension: Secondary | ICD-10-CM | POA: Diagnosis not present

## 2011-08-19 DIAGNOSIS — R7309 Other abnormal glucose: Secondary | ICD-10-CM | POA: Diagnosis not present

## 2011-08-19 DIAGNOSIS — I509 Heart failure, unspecified: Secondary | ICD-10-CM | POA: Diagnosis not present

## 2011-08-19 DIAGNOSIS — M109 Gout, unspecified: Secondary | ICD-10-CM | POA: Diagnosis not present

## 2011-08-19 DIAGNOSIS — E782 Mixed hyperlipidemia: Secondary | ICD-10-CM | POA: Diagnosis not present

## 2011-08-19 DIAGNOSIS — Z7901 Long term (current) use of anticoagulants: Secondary | ICD-10-CM | POA: Diagnosis not present

## 2011-08-19 DIAGNOSIS — Z0000 Encounter for general adult medical examination without abnormal findings: Secondary | ICD-10-CM | POA: Diagnosis not present

## 2011-08-19 DIAGNOSIS — I359 Nonrheumatic aortic valve disorder, unspecified: Secondary | ICD-10-CM | POA: Diagnosis not present

## 2011-08-19 DIAGNOSIS — Z1331 Encounter for screening for depression: Secondary | ICD-10-CM | POA: Diagnosis not present

## 2011-08-28 ENCOUNTER — Ambulatory Visit (INDEPENDENT_AMBULATORY_CARE_PROVIDER_SITE_OTHER): Payer: Medicare Other | Admitting: *Deleted

## 2011-08-28 ENCOUNTER — Encounter: Payer: Self-pay | Admitting: Internal Medicine

## 2011-08-28 DIAGNOSIS — Z95810 Presence of automatic (implantable) cardiac defibrillator: Secondary | ICD-10-CM

## 2011-08-28 DIAGNOSIS — I472 Ventricular tachycardia, unspecified: Secondary | ICD-10-CM

## 2011-08-28 DIAGNOSIS — I5022 Chronic systolic (congestive) heart failure: Secondary | ICD-10-CM | POA: Diagnosis not present

## 2011-08-29 LAB — REMOTE ICD DEVICE
AL AMPLITUDE: 0.8 mv
ATRIAL PACING ICD: 7.02 pct
BATTERY VOLTAGE: 2.8764 V
LV LEAD IMPEDENCE ICD: 703 Ohm
RV LEAD IMPEDENCE ICD: 532 Ohm
RV LEAD THRESHOLD: 0.625 V
TOT-0002: 1
TOT-0006: 20090326000000
TZAT-0001ATACH: 1
TZAT-0001SLOWVT: 1
TZAT-0002ATACH: NEGATIVE
TZAT-0002ATACH: NEGATIVE
TZAT-0002FASTVT: NEGATIVE
TZAT-0002SLOWVT: NEGATIVE
TZAT-0002SLOWVT: NEGATIVE
TZAT-0012ATACH: 150 ms
TZAT-0012FASTVT: 200 ms
TZAT-0012SLOWVT: 200 ms
TZAT-0018SLOWVT: NEGATIVE
TZAT-0019ATACH: 6 V
TZAT-0019ATACH: 6 V
TZAT-0019ATACH: 6 V
TZAT-0019SLOWVT: 8 V
TZAT-0020ATACH: 1.5 ms
TZAT-0020FASTVT: 1.5 ms
TZAT-0020SLOWVT: 1.5 ms
TZON-0005SLOWVT: 16
TZST-0001ATACH: 5
TZST-0001FASTVT: 4
TZST-0001FASTVT: 5
TZST-0001SLOWVT: 4
TZST-0001SLOWVT: 5
TZST-0002FASTVT: NEGATIVE
TZST-0002FASTVT: NEGATIVE
TZST-0002FASTVT: NEGATIVE
TZST-0002SLOWVT: NEGATIVE
TZST-0002SLOWVT: NEGATIVE
VF: 0

## 2011-09-01 ENCOUNTER — Encounter: Payer: Self-pay | Admitting: *Deleted

## 2011-09-16 DIAGNOSIS — Z7901 Long term (current) use of anticoagulants: Secondary | ICD-10-CM | POA: Diagnosis not present

## 2011-09-30 DIAGNOSIS — Z23 Encounter for immunization: Secondary | ICD-10-CM | POA: Diagnosis not present

## 2011-10-14 ENCOUNTER — Encounter: Payer: Self-pay | Admitting: *Deleted

## 2011-10-14 DIAGNOSIS — Z7901 Long term (current) use of anticoagulants: Secondary | ICD-10-CM | POA: Diagnosis not present

## 2011-10-23 ENCOUNTER — Encounter: Payer: Self-pay | Admitting: Internal Medicine

## 2011-10-23 ENCOUNTER — Ambulatory Visit (INDEPENDENT_AMBULATORY_CARE_PROVIDER_SITE_OTHER): Payer: Medicare Other | Admitting: Internal Medicine

## 2011-10-23 VITALS — BP 126/83 | HR 71 | Ht 69.0 in | Wt 209.0 lb

## 2011-10-23 DIAGNOSIS — I472 Ventricular tachycardia, unspecified: Secondary | ICD-10-CM

## 2011-10-23 DIAGNOSIS — Z95810 Presence of automatic (implantable) cardiac defibrillator: Secondary | ICD-10-CM | POA: Diagnosis not present

## 2011-10-23 DIAGNOSIS — I5022 Chronic systolic (congestive) heart failure: Secondary | ICD-10-CM

## 2011-10-23 LAB — ICD DEVICE OBSERVATION
AL THRESHOLD: 0.75 V
BAMS-0001: 170 {beats}/min
BATTERY VOLTAGE: 2.8082 V
FVT: 0
PACEART VT: 0
RV LEAD THRESHOLD: 0.625 V
TZAT-0001ATACH: 2
TZAT-0001SLOWVT: 1
TZAT-0001SLOWVT: 2
TZAT-0002ATACH: NEGATIVE
TZAT-0002ATACH: NEGATIVE
TZAT-0012ATACH: 150 ms
TZAT-0012SLOWVT: 200 ms
TZAT-0018ATACH: NEGATIVE
TZAT-0018FASTVT: NEGATIVE
TZAT-0018SLOWVT: NEGATIVE
TZAT-0018SLOWVT: NEGATIVE
TZAT-0019ATACH: 6 V
TZAT-0019ATACH: 6 V
TZAT-0019FASTVT: 8 V
TZAT-0019SLOWVT: 8 V
TZAT-0020ATACH: 1.5 ms
TZAT-0020ATACH: 1.5 ms
TZAT-0020SLOWVT: 1.5 ms
TZON-0003SLOWVT: 340 ms
TZON-0003VSLOWVT: 400 ms
TZON-0004SLOWVT: 24
TZON-0004VSLOWVT: 32
TZST-0001ATACH: 5
TZST-0001FASTVT: 2
TZST-0001FASTVT: 6
TZST-0001SLOWVT: 3
TZST-0001SLOWVT: 5
TZST-0002ATACH: NEGATIVE
TZST-0002FASTVT: NEGATIVE
TZST-0002FASTVT: NEGATIVE
TZST-0002FASTVT: NEGATIVE
TZST-0002FASTVT: NEGATIVE
TZST-0002SLOWVT: NEGATIVE
TZST-0002SLOWVT: NEGATIVE
VENTRICULAR PACING ICD: 98.61 pct
VF: 1

## 2011-10-23 NOTE — Assessment & Plan Note (Signed)
His chronic systolic heart failure remains class II. I've made no changes in his medical therapy today. He is encouraged to remain active and maintain a low-sodium diet.

## 2011-10-23 NOTE — Patient Instructions (Addendum)
Remote monitoring is used to monitor your Pacemaker of ICD from home. This monitoring reduces the number of office visits required to check your device to one time per year. It allows [place] to keep an eye on the functioning of your device to ensure it is working properly. You are scheduled for a device check from home on January 26, 2012. You may send your transmission at any time that day. If you have a wireless device, the transmission will be sent automatically. After your physician reviews your transmission, you will receive a postcard with your next transmission date.  Your physician wants you to follow-up in: 1 year with Dr Ladona Ridgel.  You will receive a reminder letter in the mail two months in advance. If you don't receive a letter, please call our office to schedule the follow-up appointment.

## 2011-10-23 NOTE — Progress Notes (Signed)
HPI Jon Butler returns today for followup. He is a very pleasant 76 year old man with a nonischemic cardiomyopathy, status post aortic valve replacement, chronic systolic heart failure, status post biventricular ICD implantation. In the interim, he is been stable. He admits to very mild increases in dyspnea with exertion. He also notes that after a long day, he believes he is more tired than normal. He denies peripheral edema. He has had no ICD shocks. No dizziness or syncope. No peripheral edema. No Known Allergies   Current Outpatient Prescriptions  Medication Sig Dispense Refill  . allopurinol (ZYLOPRIM) 300 MG tablet Take 300 mg by mouth daily.        Marland Kitchen amoxicillin (AMOXIL) 500 MG capsule Take 500 mg by mouth every other day.        Marland Kitchen aspirin 81 MG tablet Take 81 mg by mouth. 1-4 tablets a week        . captopril (CAPOTEN) 50 MG tablet Take 50 mg by mouth 3 (three) times daily.        . carvedilol (COREG) 25 MG tablet Take 25 mg by mouth 2 (two) times daily with a meal.        . Coenzyme Q10 (CO Q-10 PO) Take by mouth daily.        . furosemide (LASIX) 40 MG tablet Take 40 mg by mouth daily.        . Multiple Vitamins-Minerals (MULTIVITAMIN WITH MINERALS) tablet Take 1 tablet by mouth daily.        . Red Yeast Rice Extract (RED YEAST RICE PO) Take by mouth daily.        . rosuvastatin (CRESTOR) 10 MG tablet Take 10 mg by mouth daily.        . traZODone (DESYREL) 50 MG tablet Take 50 mg by mouth at bedtime.        Marland Kitchen warfarin (COUMADIN) 5 MG tablet Take 5 mg by mouth as directed.           Past Medical History  Diagnosis Date  . Left bundle-branch block   . Cardiomyopathy     ROS:   All systems reviewed and negative except as noted in the HPI.   Past Surgical History  Procedure Date  . Doppler echocardiography 2008, 2009  . Aortic valve replacement      Family History  Problem Relation Age of Onset  . Aneurysm Mother     brain  . Heart attack Father 29     History     Social History  . Marital Status: Married    Spouse Name: N/A    Number of Children: N/A  . Years of Education: N/A   Occupational History  . Not on file.   Social History Main Topics  . Smoking status: Never Smoker   . Smokeless tobacco: Not on file  . Alcohol Use: Yes     2-3 glasses  . Drug Use: Not on file  . Sexually Active: Not on file   Other Topics Concern  . Not on file   Social History Narrative  . No narrative on file     BP 126/83  Pulse 71  Ht 5\' 9"  (1.753 m)  Wt 209 lb (94.802 kg)  BMI 30.86 kg/m2  SpO2 98%  Physical Exam:  Well appearing 76 year old man, NAD HEENT: Unremarkable Neck:  No JVD, no thyromegally Lungs:  Clear with no wheezes, rales, or rhonchi. HEART:  Regular rate rhythm, no murmurs, no rubs, no clicks Abd:  soft,  positive bowel sounds, no organomegally, no rebound, no guarding Ext:  2 plus pulses, no edema, no cyanosis, no clubbing Skin:  No rashes no nodules Neuro:  CN II through XII intact, motor grossly intact  DEVICE  Normal device function.  See PaceArt for details.   Assess/Plan:

## 2011-10-23 NOTE — Assessment & Plan Note (Signed)
The patient had a fairly long asymptomatic nonsustained episode which occurred several weeks ago and stopped just before ICD therapies were delivered.

## 2011-10-23 NOTE — Assessment & Plan Note (Signed)
1 his Medtronic biventricular ICD is working normally. We'll plan to recheck in several months.

## 2011-11-11 DIAGNOSIS — Z7901 Long term (current) use of anticoagulants: Secondary | ICD-10-CM | POA: Diagnosis not present

## 2011-11-13 DIAGNOSIS — M25579 Pain in unspecified ankle and joints of unspecified foot: Secondary | ICD-10-CM | POA: Diagnosis not present

## 2011-12-03 DIAGNOSIS — I5022 Chronic systolic (congestive) heart failure: Secondary | ICD-10-CM | POA: Diagnosis not present

## 2011-12-03 DIAGNOSIS — R413 Other amnesia: Secondary | ICD-10-CM | POA: Diagnosis not present

## 2011-12-03 DIAGNOSIS — Z954 Presence of other heart-valve replacement: Secondary | ICD-10-CM | POA: Diagnosis not present

## 2011-12-03 DIAGNOSIS — Z7901 Long term (current) use of anticoagulants: Secondary | ICD-10-CM | POA: Diagnosis not present

## 2011-12-03 DIAGNOSIS — I429 Cardiomyopathy, unspecified: Secondary | ICD-10-CM | POA: Diagnosis not present

## 2011-12-03 DIAGNOSIS — E782 Mixed hyperlipidemia: Secondary | ICD-10-CM | POA: Diagnosis not present

## 2011-12-03 DIAGNOSIS — I10 Essential (primary) hypertension: Secondary | ICD-10-CM | POA: Diagnosis not present

## 2011-12-03 DIAGNOSIS — Z95810 Presence of automatic (implantable) cardiac defibrillator: Secondary | ICD-10-CM | POA: Diagnosis not present

## 2011-12-09 DIAGNOSIS — Z7901 Long term (current) use of anticoagulants: Secondary | ICD-10-CM | POA: Diagnosis not present

## 2011-12-16 DIAGNOSIS — Z7901 Long term (current) use of anticoagulants: Secondary | ICD-10-CM | POA: Diagnosis not present

## 2012-01-20 DIAGNOSIS — Z95810 Presence of automatic (implantable) cardiac defibrillator: Secondary | ICD-10-CM | POA: Diagnosis not present

## 2012-01-20 DIAGNOSIS — I5022 Chronic systolic (congestive) heart failure: Secondary | ICD-10-CM | POA: Diagnosis not present

## 2012-01-20 DIAGNOSIS — I429 Cardiomyopathy, unspecified: Secondary | ICD-10-CM | POA: Diagnosis not present

## 2012-01-20 DIAGNOSIS — Z7901 Long term (current) use of anticoagulants: Secondary | ICD-10-CM | POA: Diagnosis not present

## 2012-01-20 DIAGNOSIS — R413 Other amnesia: Secondary | ICD-10-CM | POA: Diagnosis not present

## 2012-01-20 DIAGNOSIS — Z954 Presence of other heart-valve replacement: Secondary | ICD-10-CM | POA: Diagnosis not present

## 2012-01-20 DIAGNOSIS — E782 Mixed hyperlipidemia: Secondary | ICD-10-CM | POA: Diagnosis not present

## 2012-01-20 DIAGNOSIS — I10 Essential (primary) hypertension: Secondary | ICD-10-CM | POA: Diagnosis not present

## 2012-01-26 ENCOUNTER — Ambulatory Visit (INDEPENDENT_AMBULATORY_CARE_PROVIDER_SITE_OTHER): Payer: Medicare Other | Admitting: *Deleted

## 2012-01-26 ENCOUNTER — Encounter: Payer: Self-pay | Admitting: Internal Medicine

## 2012-01-26 DIAGNOSIS — Z95810 Presence of automatic (implantable) cardiac defibrillator: Secondary | ICD-10-CM

## 2012-01-26 DIAGNOSIS — I472 Ventricular tachycardia, unspecified: Secondary | ICD-10-CM

## 2012-01-26 DIAGNOSIS — I5022 Chronic systolic (congestive) heart failure: Secondary | ICD-10-CM

## 2012-01-27 DIAGNOSIS — Z7901 Long term (current) use of anticoagulants: Secondary | ICD-10-CM | POA: Diagnosis not present

## 2012-01-28 LAB — REMOTE ICD DEVICE
AL AMPLITUDE: 0.6 mv
ATRIAL PACING ICD: 6.88 pct
CHARGE TIME: 10.83 s
FVT: 0
LV LEAD IMPEDENCE ICD: 703 Ohm
RV LEAD AMPLITUDE: 20 mv
RV LEAD IMPEDENCE ICD: 494 Ohm
TOT-0001: 1
TOT-0006: 20090326000000
TZAT-0001ATACH: 1
TZAT-0001ATACH: 3
TZAT-0001SLOWVT: 1
TZAT-0002FASTVT: NEGATIVE
TZAT-0002SLOWVT: NEGATIVE
TZAT-0002SLOWVT: NEGATIVE
TZAT-0012ATACH: 150 ms
TZAT-0012ATACH: 150 ms
TZAT-0012FASTVT: 200 ms
TZAT-0012SLOWVT: 200 ms
TZAT-0018ATACH: NEGATIVE
TZAT-0019SLOWVT: 8 V
TZAT-0019SLOWVT: 8 V
TZAT-0020ATACH: 1.5 ms
TZAT-0020ATACH: 1.5 ms
TZON-0003ATACH: 350 ms
TZON-0003VSLOWVT: 400 ms
TZON-0005SLOWVT: 16
TZST-0001ATACH: 4
TZST-0001ATACH: 6
TZST-0001FASTVT: 3
TZST-0001FASTVT: 4
TZST-0001FASTVT: 5
TZST-0001SLOWVT: 4
TZST-0001SLOWVT: 6
TZST-0002ATACH: NEGATIVE
TZST-0002ATACH: NEGATIVE
TZST-0002FASTVT: NEGATIVE
TZST-0002FASTVT: NEGATIVE
TZST-0002FASTVT: NEGATIVE
TZST-0002SLOWVT: NEGATIVE
TZST-0002SLOWVT: NEGATIVE
VF: 0

## 2012-02-05 ENCOUNTER — Encounter: Payer: Self-pay | Admitting: *Deleted

## 2012-02-24 DIAGNOSIS — I428 Other cardiomyopathies: Secondary | ICD-10-CM | POA: Diagnosis not present

## 2012-02-24 DIAGNOSIS — Z125 Encounter for screening for malignant neoplasm of prostate: Secondary | ICD-10-CM | POA: Diagnosis not present

## 2012-02-24 DIAGNOSIS — N529 Male erectile dysfunction, unspecified: Secondary | ICD-10-CM | POA: Diagnosis not present

## 2012-02-24 DIAGNOSIS — E782 Mixed hyperlipidemia: Secondary | ICD-10-CM | POA: Diagnosis not present

## 2012-02-24 DIAGNOSIS — M659 Synovitis and tenosynovitis, unspecified: Secondary | ICD-10-CM | POA: Diagnosis not present

## 2012-02-24 DIAGNOSIS — M109 Gout, unspecified: Secondary | ICD-10-CM | POA: Diagnosis not present

## 2012-02-24 DIAGNOSIS — I10 Essential (primary) hypertension: Secondary | ICD-10-CM | POA: Diagnosis not present

## 2012-02-24 DIAGNOSIS — IMO0002 Reserved for concepts with insufficient information to code with codable children: Secondary | ICD-10-CM | POA: Diagnosis not present

## 2012-02-24 DIAGNOSIS — R5383 Other fatigue: Secondary | ICD-10-CM | POA: Diagnosis not present

## 2012-02-24 DIAGNOSIS — M12279 Villonodular synovitis (pigmented), unspecified ankle and foot: Secondary | ICD-10-CM | POA: Diagnosis not present

## 2012-02-24 DIAGNOSIS — Z7901 Long term (current) use of anticoagulants: Secondary | ICD-10-CM | POA: Diagnosis not present

## 2012-02-24 DIAGNOSIS — R5381 Other malaise: Secondary | ICD-10-CM | POA: Diagnosis not present

## 2012-03-10 DIAGNOSIS — Z7901 Long term (current) use of anticoagulants: Secondary | ICD-10-CM | POA: Diagnosis not present

## 2012-04-06 DIAGNOSIS — E291 Testicular hypofunction: Secondary | ICD-10-CM | POA: Diagnosis not present

## 2012-04-06 DIAGNOSIS — Z7901 Long term (current) use of anticoagulants: Secondary | ICD-10-CM | POA: Diagnosis not present

## 2012-04-26 ENCOUNTER — Other Ambulatory Visit: Payer: Self-pay | Admitting: Internal Medicine

## 2012-04-26 ENCOUNTER — Ambulatory Visit (INDEPENDENT_AMBULATORY_CARE_PROVIDER_SITE_OTHER): Payer: Medicare Other | Admitting: *Deleted

## 2012-04-26 ENCOUNTER — Encounter: Payer: Self-pay | Admitting: Internal Medicine

## 2012-04-26 DIAGNOSIS — I5022 Chronic systolic (congestive) heart failure: Secondary | ICD-10-CM | POA: Diagnosis not present

## 2012-04-26 DIAGNOSIS — I472 Ventricular tachycardia, unspecified: Secondary | ICD-10-CM

## 2012-04-26 DIAGNOSIS — Z95810 Presence of automatic (implantable) cardiac defibrillator: Secondary | ICD-10-CM

## 2012-05-03 LAB — REMOTE ICD DEVICE
AL IMPEDENCE ICD: 418 Ohm
CHARGE TIME: 11.701 s
PACEART VT: 0
RV LEAD THRESHOLD: 0.625 V
TOT-0001: 1
TOT-0002: 1
TZAT-0001SLOWVT: 1
TZAT-0001SLOWVT: 2
TZAT-0002ATACH: NEGATIVE
TZAT-0002ATACH: NEGATIVE
TZAT-0002FASTVT: NEGATIVE
TZAT-0002SLOWVT: NEGATIVE
TZAT-0012ATACH: 150 ms
TZAT-0012ATACH: 150 ms
TZAT-0018SLOWVT: NEGATIVE
TZAT-0019ATACH: 6 V
TZAT-0019ATACH: 6 V
TZAT-0019FASTVT: 8 V
TZAT-0019SLOWVT: 8 V
TZAT-0020ATACH: 1.5 ms
TZAT-0020ATACH: 1.5 ms
TZAT-0020SLOWVT: 1.5 ms
TZON-0003ATACH: 350 ms
TZON-0003SLOWVT: 340 ms
TZON-0004SLOWVT: 24
TZON-0005SLOWVT: 16
TZST-0001ATACH: 4
TZST-0001FASTVT: 3
TZST-0001SLOWVT: 4
TZST-0001SLOWVT: 5
TZST-0002ATACH: NEGATIVE
TZST-0002FASTVT: NEGATIVE
TZST-0002FASTVT: NEGATIVE
TZST-0002FASTVT: NEGATIVE
TZST-0002SLOWVT: NEGATIVE
TZST-0002SLOWVT: NEGATIVE
TZST-0002SLOWVT: NEGATIVE
VENTRICULAR PACING ICD: 98.9 pct

## 2012-05-04 DIAGNOSIS — Z7901 Long term (current) use of anticoagulants: Secondary | ICD-10-CM | POA: Diagnosis not present

## 2012-05-04 DIAGNOSIS — E291 Testicular hypofunction: Secondary | ICD-10-CM | POA: Diagnosis not present

## 2012-05-27 ENCOUNTER — Encounter: Payer: Self-pay | Admitting: *Deleted

## 2012-06-01 DIAGNOSIS — Z7901 Long term (current) use of anticoagulants: Secondary | ICD-10-CM | POA: Diagnosis not present

## 2012-06-16 ENCOUNTER — Other Ambulatory Visit: Payer: Self-pay | Admitting: Internal Medicine

## 2012-06-16 DIAGNOSIS — G459 Transient cerebral ischemic attack, unspecified: Secondary | ICD-10-CM

## 2012-06-16 DIAGNOSIS — F05 Delirium due to known physiological condition: Secondary | ICD-10-CM | POA: Diagnosis not present

## 2012-06-16 DIAGNOSIS — R413 Other amnesia: Secondary | ICD-10-CM

## 2012-06-16 DIAGNOSIS — R410 Disorientation, unspecified: Secondary | ICD-10-CM

## 2012-06-17 DIAGNOSIS — Z961 Presence of intraocular lens: Secondary | ICD-10-CM | POA: Diagnosis not present

## 2012-06-18 ENCOUNTER — Ambulatory Visit
Admission: RE | Admit: 2012-06-18 | Discharge: 2012-06-18 | Disposition: A | Discharge status: Home / Self Care | Payer: Medicare Other | Source: Ambulatory Visit | Attending: Internal Medicine | Admitting: Internal Medicine

## 2012-06-18 DIAGNOSIS — F29 Unspecified psychosis not due to a substance or known physiological condition: Secondary | ICD-10-CM | POA: Diagnosis not present

## 2012-06-18 DIAGNOSIS — R410 Disorientation, unspecified: Secondary | ICD-10-CM

## 2012-06-18 DIAGNOSIS — R413 Other amnesia: Secondary | ICD-10-CM

## 2012-06-18 DIAGNOSIS — R42 Dizziness and giddiness: Secondary | ICD-10-CM | POA: Diagnosis not present

## 2012-06-18 DIAGNOSIS — I658 Occlusion and stenosis of other precerebral arteries: Secondary | ICD-10-CM | POA: Diagnosis not present

## 2012-06-18 DIAGNOSIS — G459 Transient cerebral ischemic attack, unspecified: Secondary | ICD-10-CM

## 2012-06-22 ENCOUNTER — Other Ambulatory Visit: Payer: Self-pay | Admitting: Internal Medicine

## 2012-06-22 DIAGNOSIS — R413 Other amnesia: Secondary | ICD-10-CM | POA: Diagnosis not present

## 2012-06-22 DIAGNOSIS — E041 Nontoxic single thyroid nodule: Secondary | ICD-10-CM | POA: Diagnosis not present

## 2012-06-24 ENCOUNTER — Other Ambulatory Visit: Payer: Medicare Other

## 2012-06-29 ENCOUNTER — Ambulatory Visit
Admission: RE | Admit: 2012-06-29 | Discharge: 2012-06-29 | Disposition: A | Discharge status: Home / Self Care | Payer: Medicare Other | Source: Ambulatory Visit | Attending: Internal Medicine | Admitting: Internal Medicine

## 2012-06-29 DIAGNOSIS — Z7901 Long term (current) use of anticoagulants: Secondary | ICD-10-CM | POA: Diagnosis not present

## 2012-06-29 DIAGNOSIS — E041 Nontoxic single thyroid nodule: Secondary | ICD-10-CM

## 2012-06-29 DIAGNOSIS — E291 Testicular hypofunction: Secondary | ICD-10-CM | POA: Diagnosis not present

## 2012-07-27 DIAGNOSIS — Z7901 Long term (current) use of anticoagulants: Secondary | ICD-10-CM | POA: Diagnosis not present

## 2012-07-27 DIAGNOSIS — E291 Testicular hypofunction: Secondary | ICD-10-CM | POA: Diagnosis not present

## 2012-08-02 ENCOUNTER — Ambulatory Visit (INDEPENDENT_AMBULATORY_CARE_PROVIDER_SITE_OTHER): Payer: Medicare Other | Admitting: *Deleted

## 2012-08-02 ENCOUNTER — Encounter: Payer: Self-pay | Admitting: Internal Medicine

## 2012-08-02 DIAGNOSIS — I5022 Chronic systolic (congestive) heart failure: Secondary | ICD-10-CM | POA: Diagnosis not present

## 2012-08-02 DIAGNOSIS — I472 Ventricular tachycardia, unspecified: Secondary | ICD-10-CM

## 2012-08-02 DIAGNOSIS — Z95810 Presence of automatic (implantable) cardiac defibrillator: Secondary | ICD-10-CM | POA: Diagnosis not present

## 2012-08-10 LAB — REMOTE ICD DEVICE
AL AMPLITUDE: 0.625 mv
ATRIAL PACING ICD: 4.54 pct
BAMS-0001: 170 {beats}/min
FVT: 0
LV LEAD IMPEDENCE ICD: 779 Ohm
LV LEAD THRESHOLD: 0.875 V
PACEART VT: 0
RV LEAD AMPLITUDE: 24.125 mv
RV LEAD THRESHOLD: 0.625 V
TOT-0001: 1
TZAT-0001ATACH: 2
TZAT-0001ATACH: 3
TZAT-0002ATACH: NEGATIVE
TZAT-0002ATACH: NEGATIVE
TZAT-0002SLOWVT: NEGATIVE
TZAT-0012ATACH: 150 ms
TZAT-0012ATACH: 150 ms
TZAT-0012ATACH: 150 ms
TZAT-0012FASTVT: 200 ms
TZAT-0012SLOWVT: 200 ms
TZAT-0018ATACH: NEGATIVE
TZAT-0018SLOWVT: NEGATIVE
TZAT-0019FASTVT: 8 V
TZAT-0019SLOWVT: 8 V
TZAT-0019SLOWVT: 8 V
TZAT-0020ATACH: 1.5 ms
TZAT-0020FASTVT: 1.5 ms
TZAT-0020SLOWVT: 1.5 ms
TZAT-0020SLOWVT: 1.5 ms
TZON-0003ATACH: 350 ms
TZON-0003SLOWVT: 340 ms
TZON-0003VSLOWVT: 400 ms
TZON-0004VSLOWVT: 32
TZST-0001ATACH: 4
TZST-0001ATACH: 6
TZST-0001FASTVT: 2
TZST-0001FASTVT: 5
TZST-0001SLOWVT: 3
TZST-0001SLOWVT: 4
TZST-0001SLOWVT: 6
TZST-0002ATACH: NEGATIVE
TZST-0002FASTVT: NEGATIVE
TZST-0002FASTVT: NEGATIVE
TZST-0002SLOWVT: NEGATIVE
VF: 0

## 2012-08-16 ENCOUNTER — Telehealth: Payer: Self-pay | Admitting: Internal Medicine

## 2012-08-16 NOTE — Telephone Encounter (Signed)
Spoke w/wife in regards to appointment moved up to 08-31-12 @ 1030. Device reached RRT. Wife aware.

## 2012-08-16 NOTE — Telephone Encounter (Signed)
New prob  Pt returning a call from Kristen// req a call back

## 2012-08-25 DIAGNOSIS — I10 Essential (primary) hypertension: Secondary | ICD-10-CM | POA: Diagnosis not present

## 2012-08-25 DIAGNOSIS — M109 Gout, unspecified: Secondary | ICD-10-CM | POA: Diagnosis not present

## 2012-08-25 DIAGNOSIS — E782 Mixed hyperlipidemia: Secondary | ICD-10-CM | POA: Diagnosis not present

## 2012-08-25 DIAGNOSIS — Z7901 Long term (current) use of anticoagulants: Secondary | ICD-10-CM | POA: Diagnosis not present

## 2012-08-25 DIAGNOSIS — R413 Other amnesia: Secondary | ICD-10-CM | POA: Diagnosis not present

## 2012-08-25 DIAGNOSIS — R7989 Other specified abnormal findings of blood chemistry: Secondary | ICD-10-CM | POA: Diagnosis not present

## 2012-08-25 DIAGNOSIS — Z1331 Encounter for screening for depression: Secondary | ICD-10-CM | POA: Diagnosis not present

## 2012-08-25 DIAGNOSIS — Z954 Presence of other heart-valve replacement: Secondary | ICD-10-CM | POA: Diagnosis not present

## 2012-08-25 DIAGNOSIS — Z0000 Encounter for general adult medical examination without abnormal findings: Secondary | ICD-10-CM | POA: Diagnosis not present

## 2012-08-25 DIAGNOSIS — E291 Testicular hypofunction: Secondary | ICD-10-CM | POA: Diagnosis not present

## 2012-08-31 ENCOUNTER — Encounter: Payer: Self-pay | Admitting: Internal Medicine

## 2012-08-31 ENCOUNTER — Ambulatory Visit (INDEPENDENT_AMBULATORY_CARE_PROVIDER_SITE_OTHER): Payer: Medicare Other | Admitting: Internal Medicine

## 2012-08-31 ENCOUNTER — Encounter: Payer: Self-pay | Admitting: *Deleted

## 2012-08-31 VITALS — BP 119/78 | HR 70 | Ht 69.0 in | Wt 213.8 lb

## 2012-08-31 DIAGNOSIS — I472 Ventricular tachycardia: Secondary | ICD-10-CM

## 2012-08-31 DIAGNOSIS — Z95810 Presence of automatic (implantable) cardiac defibrillator: Secondary | ICD-10-CM

## 2012-08-31 DIAGNOSIS — I5022 Chronic systolic (congestive) heart failure: Secondary | ICD-10-CM

## 2012-08-31 NOTE — Assessment & Plan Note (Signed)
His device is at elective replacement. We will schedule ICD generator change in 3-4 weeks.

## 2012-08-31 NOTE — Assessment & Plan Note (Signed)
His symptoms are class II. He will continue his current medical therapy, and maintain a low-sodium diet. 

## 2012-08-31 NOTE — Patient Instructions (Addendum)
GENERATOR CHANGE

## 2012-08-31 NOTE — Progress Notes (Signed)
HPI Jon Butler returns today for followup. He is a 77 year old man with a nonischemic cardiomyopathy, chronic class II systolic heart failure, left bundle branch block, ventricular tachycardia, status post biventricular ICD implantation. In the interim he has been stable. Over the last several years, he has noted some reduction in his exercise ability. He denies syncope. No chest pain or peripheral edema. No cough or hemoptysis. No Known Allergies   Current Outpatient Prescriptions  Medication Sig Dispense Refill  . allopurinol (ZYLOPRIM) 300 MG tablet Take 300 mg by mouth daily.        Marland Kitchen amoxicillin (AMOXIL) 500 MG capsule Take 500 mg by mouth every other day.        . captopril (CAPOTEN) 50 MG tablet Take 50 mg by mouth 3 (three) times daily.        . carvedilol (COREG) 25 MG tablet Take 25 mg by mouth 2 (two) times daily with a meal.        . Coenzyme Q10 (CO Q-10 PO) Take by mouth daily.        . furosemide (LASIX) 40 MG tablet Take 40 mg by mouth daily.        . memantine (NAMENDA) 5 MG tablet Take 5 mg by mouth daily.      . Multiple Vitamins-Minerals (MULTIVITAMIN WITH MINERALS) tablet Take 1 tablet by mouth daily.        . Red Yeast Rice Extract (RED YEAST RICE PO) Take by mouth daily.        . rosuvastatin (CRESTOR) 10 MG tablet Take 10 mg by mouth daily.        . traZODone (DESYREL) 50 MG tablet Take 50 mg by mouth at bedtime.        Marland Kitchen warfarin (COUMADIN) 5 MG tablet Take 5 mg by mouth as directed.         No current facility-administered medications for this visit.     Past Medical History  Diagnosis Date  . Left bundle-branch block   . Cardiomyopathy     ROS:   All systems reviewed and negative except as noted in the HPI.   Past Surgical History  Procedure Laterality Date  . Doppler echocardiography  2008, 2009  . Aortic valve replacement       Family History  Problem Relation Age of Onset  . Aneurysm Mother     brain  . Heart attack Father 6     History    Social History  . Marital Status: Married    Spouse Name: N/A    Number of Children: N/A  . Years of Education: N/A   Occupational History  . Not on file.   Social History Main Topics  . Smoking status: Never Smoker   . Smokeless tobacco: Not on file  . Alcohol Use: Yes     Comment: 2-3 glasses  . Drug Use: Not on file  . Sexual Activity: Not on file   Other Topics Concern  . Not on file   Social History Narrative  . No narrative on file     BP 119/78  Pulse 70  Ht 5\' 9"  (1.753 m)  Wt 213 lb 12.8 oz (96.979 kg)  BMI 31.56 kg/m2  Physical Exam:  Well appearing 77 year old man,NAD HEENT: Unremarkable Neck:  7 cm JVD, no thyromegally Back:  No CVA tenderness Lungs:  Clear with no wheezes, rales, or rhonchi. HEART:  Regular rate rhythm, no murmurs, no rubs, no clicks Abd:  soft, positive bowel sounds,  no organomegally, no rebound, no guarding Ext:  2 plus pulses, no edema, no cyanosis, no clubbing Skin:  No rashes no nodules Neuro:  CN II through XII intact, motor grossly intact  EKG - normal sinus rhythm with biventricular pacing  DEVICE  Normal device function.  See PaceArt for details.   Assess/Plan:

## 2012-08-31 NOTE — Assessment & Plan Note (Signed)
Since last interrogation, he has had no recurrent sustained ventricular arrhythmias.

## 2012-09-07 ENCOUNTER — Encounter (HOSPITAL_COMMUNITY): Payer: Self-pay | Admitting: Pharmacy Technician

## 2012-09-08 ENCOUNTER — Other Ambulatory Visit: Payer: Self-pay | Admitting: *Deleted

## 2012-09-08 DIAGNOSIS — I472 Ventricular tachycardia: Secondary | ICD-10-CM

## 2012-09-15 ENCOUNTER — Telehealth: Payer: Self-pay | Admitting: *Deleted

## 2012-09-15 ENCOUNTER — Ambulatory Visit (INDEPENDENT_AMBULATORY_CARE_PROVIDER_SITE_OTHER): Payer: Medicare Other | Admitting: *Deleted

## 2012-09-15 DIAGNOSIS — I509 Heart failure, unspecified: Secondary | ICD-10-CM

## 2012-09-15 LAB — REMOTE ICD DEVICE
AL AMPLITUDE: 0.9 mv
AL IMPEDENCE ICD: 437 Ohm
BAMS-0001: 170 {beats}/min
BATTERY VOLTAGE: 2.6173 V
LV LEAD IMPEDENCE ICD: 703 Ohm
TOT-0006: 20090326000000
TZAT-0001ATACH: 2
TZAT-0001SLOWVT: 1
TZAT-0001SLOWVT: 2
TZAT-0002ATACH: NEGATIVE
TZAT-0002ATACH: NEGATIVE
TZAT-0012ATACH: 150 ms
TZAT-0012SLOWVT: 200 ms
TZAT-0018ATACH: NEGATIVE
TZAT-0018FASTVT: NEGATIVE
TZAT-0018SLOWVT: NEGATIVE
TZAT-0018SLOWVT: NEGATIVE
TZAT-0019ATACH: 6 V
TZAT-0019ATACH: 6 V
TZAT-0019FASTVT: 8 V
TZAT-0019SLOWVT: 8 V
TZAT-0020ATACH: 1.5 ms
TZAT-0020ATACH: 1.5 ms
TZAT-0020SLOWVT: 1.5 ms
TZON-0003VSLOWVT: 400 ms
TZON-0004SLOWVT: 24
TZON-0004VSLOWVT: 32
TZST-0001FASTVT: 2
TZST-0001FASTVT: 6
TZST-0001SLOWVT: 3
TZST-0001SLOWVT: 5
TZST-0002ATACH: NEGATIVE
TZST-0002FASTVT: NEGATIVE
TZST-0002FASTVT: NEGATIVE
TZST-0002FASTVT: NEGATIVE
TZST-0002SLOWVT: NEGATIVE
VF: 0

## 2012-09-15 NOTE — Progress Notes (Signed)
icd remote received. See prev telephone note.

## 2012-09-15 NOTE — Telephone Encounter (Signed)
Pt's son called in regarding transmission being sent in due to AF and increased fluid. Transmission shows pt in AF 92.5% of time. Optivol increase trending up since end of August. Dr Johney Frame and Dr Ladona Ridgel reviewed and instructed to increase Furosemide 40mg  to bid x 3 days and if in Aflutter on 09-20-12 (day of gen change), pt will be CV. Spoke with son and pt's wife in regards to all of above.  Pt is due for labs tomorrow in office.

## 2012-09-16 ENCOUNTER — Telehealth: Payer: Self-pay | Admitting: *Deleted

## 2012-09-16 ENCOUNTER — Other Ambulatory Visit (INDEPENDENT_AMBULATORY_CARE_PROVIDER_SITE_OTHER): Payer: Medicare Other

## 2012-09-16 DIAGNOSIS — I5022 Chronic systolic (congestive) heart failure: Secondary | ICD-10-CM | POA: Diagnosis not present

## 2012-09-16 DIAGNOSIS — I472 Ventricular tachycardia: Secondary | ICD-10-CM | POA: Diagnosis not present

## 2012-09-16 LAB — CBC WITH DIFFERENTIAL/PLATELET
Basophils Relative: 0.2 % (ref 0.0–3.0)
Eosinophils Relative: 0.6 % (ref 0.0–5.0)
HCT: 39.2 % (ref 39.0–52.0)
MCV: 97.7 fl (ref 78.0–100.0)
Monocytes Absolute: 0.6 10*3/uL (ref 0.1–1.0)
Monocytes Relative: 8.8 % (ref 3.0–12.0)
Neutrophils Relative %: 69.3 % (ref 43.0–77.0)
Platelets: 117 10*3/uL — ABNORMAL LOW (ref 150.0–400.0)
RBC: 4.02 Mil/uL — ABNORMAL LOW (ref 4.22–5.81)
WBC: 7.2 10*3/uL (ref 4.5–10.5)

## 2012-09-16 LAB — BASIC METABOLIC PANEL
BUN: 16 mg/dL (ref 6–23)
CO2: 31 mEq/L (ref 19–32)
GFR: 70.85 mL/min (ref >60.00)
Glucose, Bld: 136 mg/dL — ABNORMAL HIGH (ref 70–99)
Potassium: 4 mEq/L (ref 3.5–5.1)
Sodium: 136 mEq/L (ref 135–145)

## 2012-09-16 NOTE — Telephone Encounter (Signed)
Patient in for labs and education for procedure_ Generator Change. Coumadin advisement pending lab results.

## 2012-09-16 NOTE — Telephone Encounter (Signed)
INR 3.1. Will forward to Dennis Bast, RN/Dr. Ladona Ridgel, to decide about Coumadin prior to Generator Change.

## 2012-09-16 NOTE — Telephone Encounter (Signed)
Patient needs to be notified of Coumadin dosing advisement for procedure next week, pending lab results from today.

## 2012-09-17 NOTE — Telephone Encounter (Signed)
Discussed with Dr. Ladona Ridgel. Only hold Sun night dose as he may have to have a DCCV at time of generator change.  Wife aware

## 2012-09-19 DIAGNOSIS — Z7901 Long term (current) use of anticoagulants: Secondary | ICD-10-CM | POA: Diagnosis not present

## 2012-09-19 DIAGNOSIS — I447 Left bundle-branch block, unspecified: Secondary | ICD-10-CM | POA: Diagnosis not present

## 2012-09-19 DIAGNOSIS — I472 Ventricular tachycardia: Secondary | ICD-10-CM | POA: Diagnosis not present

## 2012-09-19 DIAGNOSIS — I5022 Chronic systolic (congestive) heart failure: Secondary | ICD-10-CM | POA: Diagnosis not present

## 2012-09-19 DIAGNOSIS — Z79899 Other long term (current) drug therapy: Secondary | ICD-10-CM | POA: Diagnosis not present

## 2012-09-19 DIAGNOSIS — R791 Abnormal coagulation profile: Secondary | ICD-10-CM | POA: Diagnosis not present

## 2012-09-19 DIAGNOSIS — Z954 Presence of other heart-valve replacement: Secondary | ICD-10-CM | POA: Diagnosis not present

## 2012-09-19 DIAGNOSIS — Z4502 Encounter for adjustment and management of automatic implantable cardiac defibrillator: Secondary | ICD-10-CM | POA: Diagnosis not present

## 2012-09-19 DIAGNOSIS — I428 Other cardiomyopathies: Secondary | ICD-10-CM | POA: Diagnosis not present

## 2012-09-19 MED ORDER — CEFAZOLIN SODIUM-DEXTROSE 2-3 GM-% IV SOLR
2.0000 g | INTRAVENOUS | Status: DC
  Filled 2012-09-19: qty 50

## 2012-09-19 MED ORDER — SODIUM CHLORIDE 0.9 % IR SOLN
80.0000 mg | Status: DC
  Filled 2012-09-19: qty 2

## 2012-09-20 ENCOUNTER — Ambulatory Visit (HOSPITAL_COMMUNITY)
Admission: RE | Admit: 2012-09-20 | Discharge: 2012-09-20 | Disposition: A | Discharge status: Home / Self Care | Payer: Medicare Other | Source: Ambulatory Visit | Attending: Internal Medicine | Admitting: Internal Medicine

## 2012-09-20 ENCOUNTER — Encounter (HOSPITAL_COMMUNITY)
Admission: RE | Disposition: A | Discharge status: Home / Self Care | Payer: Self-pay | Source: Ambulatory Visit | Attending: Internal Medicine

## 2012-09-20 DIAGNOSIS — Z79899 Other long term (current) drug therapy: Secondary | ICD-10-CM | POA: Insufficient documentation

## 2012-09-20 DIAGNOSIS — R791 Abnormal coagulation profile: Secondary | ICD-10-CM | POA: Diagnosis not present

## 2012-09-20 DIAGNOSIS — I472 Ventricular tachycardia, unspecified: Secondary | ICD-10-CM | POA: Insufficient documentation

## 2012-09-20 DIAGNOSIS — Z7901 Long term (current) use of anticoagulants: Secondary | ICD-10-CM | POA: Insufficient documentation

## 2012-09-20 DIAGNOSIS — I428 Other cardiomyopathies: Secondary | ICD-10-CM | POA: Insufficient documentation

## 2012-09-20 DIAGNOSIS — I4729 Other ventricular tachycardia: Secondary | ICD-10-CM | POA: Insufficient documentation

## 2012-09-20 DIAGNOSIS — I447 Left bundle-branch block, unspecified: Secondary | ICD-10-CM | POA: Diagnosis not present

## 2012-09-20 DIAGNOSIS — Z4502 Encounter for adjustment and management of automatic implantable cardiac defibrillator: Secondary | ICD-10-CM | POA: Insufficient documentation

## 2012-09-20 DIAGNOSIS — Z954 Presence of other heart-valve replacement: Secondary | ICD-10-CM | POA: Insufficient documentation

## 2012-09-20 DIAGNOSIS — I5022 Chronic systolic (congestive) heart failure: Secondary | ICD-10-CM | POA: Diagnosis not present

## 2012-09-20 HISTORY — PX: IMPLANTABLE CARDIOVERTER DEFIBRILLATOR GENERATOR CHANGE: SHX5474

## 2012-09-20 LAB — PROTIME-INR
INR: 1.61 — ABNORMAL HIGH (ref 0.00–1.49)
Prothrombin Time: 18.7 seconds — ABNORMAL HIGH (ref 11.6–15.2)

## 2012-09-20 SURGERY — IMPLANTABLE CARDIOVERTER DEFIBRILLATOR GENERATOR CHANGE
Anesthesia: LOCAL

## 2012-09-20 MED ORDER — DIPHENHYDRAMINE HCL 50 MG/ML IJ SOLN
INTRAMUSCULAR | Status: AC
  Filled 2012-09-20: qty 1

## 2012-09-20 MED ORDER — ACETAMINOPHEN 325 MG PO TABS: 325.0000 mg | ORAL_TABLET | ORAL | Status: DC | PRN

## 2012-09-20 MED ORDER — LIDOCAINE HCL (PF) 1 % IJ SOLN
INTRAMUSCULAR | Status: AC
  Filled 2012-09-20: qty 60

## 2012-09-20 MED ORDER — MIDAZOLAM HCL 5 MG/5ML IJ SOLN
INTRAMUSCULAR | Status: AC
  Filled 2012-09-20: qty 5

## 2012-09-20 MED ORDER — FENTANYL CITRATE 0.05 MG/ML IJ SOLN
INTRAMUSCULAR | Status: AC
  Filled 2012-09-20: qty 2

## 2012-09-20 MED ORDER — CHLORHEXIDINE GLUCONATE 4 % EX LIQD
60.0000 mL | Freq: Once | CUTANEOUS | Status: DC
  Filled 2012-09-20: qty 60

## 2012-09-20 MED ORDER — ONDANSETRON HCL 4 MG/2ML IJ SOLN: 4.0000 mg | Freq: Four times a day (QID) | INTRAMUSCULAR | Status: DC | PRN

## 2012-09-20 MED ORDER — MUPIROCIN 2 % EX OINT
TOPICAL_OINTMENT | CUTANEOUS | Status: AC
  Filled 2012-09-20: qty 22

## 2012-09-20 MED ORDER — SODIUM CHLORIDE 0.9 % IV SOLN: INTRAVENOUS | Status: DC

## 2012-09-20 MED ORDER — MUPIROCIN 2 % EX OINT
TOPICAL_OINTMENT | Freq: Two times a day (BID) | CUTANEOUS | Status: DC
  Filled 2012-09-20: qty 22

## 2012-09-20 NOTE — Op Note (Signed)
NAMEADMIR, Jon Butler NO.:  [id number]  MEDICAL RECORD NO.:  [id number]  LOCATION:  [facility]                         FACILITY:  [facility]  PHYSICIAN:  Doylene Canning. Ladona Ridgel, MD    DATE OF BIRTH:  1935-09-28  DATE OF PROCEDURE:  09/20/2012 DATE OF DISCHARGE:  09/20/2012                              OPERATIVE REPORT   PROCEDURE PERFORMED:  Removal of previously implanted Medtronic ICD which had recent electrode placement and insertion of a new Medtronic ICD in a patient with prior nonischemic cardiomyopathy, chronic systolic heart failure, ejection fraction 20%, left bundle-branch block and ventricular tachycardia.  INTRODUCTION:  The patient is a 77 year old man with status post aortic valve replacement with a nonischemic cardiomyopathy and chronic systolic heart failure.  He is status post BiV-ICD insertion has reached elective replacement, and now presents for removal his old device and insertion of new.  PROCEDURE:  After informed consent was obtained, the patient was taken to the Diagnostic EP Lab in the fasting state.  After usual preparation and draping, intravenous fentanyl and midazolam was given for sedation. A 30-mL of lidocaine was infiltrated into the left infraclavicular region.  A 7-cm incision was carried out over this region. Electrocautery was utilized to get down to the fascial plane.  The ICD pocket was entered with electrocautery.  The generator was removed with gentle traction.  The device was removed from the pocket.  The leads were disconnected, evaluated, and found to working in satisfactorily.  P- and R-waves were satisfactory and tracing pacing parameters were stable except the atrial lead could not be assessed because of the patient's underlying atrial fibrillation.  At this point, the new Medtronic BiV- ICD was connected to the old leads and placed back in the subcutaneous pocket.  The pocket was irrigated with antibiotic irrigation.   The incision was closed with 2-0 and 3-0 Vicryl.  Benzoin and Steri-Strips painted on skin.  A pressure dressing was applied.  The patient was returned to the recovery area in satisfactory condition.  The Medtronic device serial [id number].  Of note, defibrillation and threshold testing could not be carried out today secondary to the patient's subtherapeutic INR.  COMPLICATIONS:  There were no immediate procedure complications.  RESULTS:  This demonstrates successful removal of a previously implanted Medtronic ICD and insertion a new Medtronic ICD in a patient with prior nonischemic cardiomyopathy, ventricular tachycardia, and chronic systolic heart failure.     Doylene Canning. Ladona Ridgel, MD     GWT/MEDQ  D:  09/20/2012  T:  09/20/2012  Job:  [id number]  cc:   Lyn Records, M.D.

## 2012-09-20 NOTE — CV Procedure (Signed)
BiV ICD removal and insertion of a new device without immediate complication. [id number].

## 2012-09-20 NOTE — Interval H&P Note (Signed)
History and Physical Interval Note: In the interim, Jon Butler has developed atrial fib and his INR was 3.1 last week. I had him hold his coumadin dose last night. Unclear how many days he held coumadin as his level is 1.6 today. Will not be able to DCCV.  09/20/2012 7:44 AM  Jon Butler  has presented today for surgery, with the diagnosis of end of life generator  The various methods of treatment have been discussed with the patient and family. After consideration of risks, benefits and other options for treatment, the patient has consented to  Procedure(s): IMPLANTABLE CARDIOVERTER DEFIBRILLATOR GENERATOR CHANGE (N/A) as a surgical intervention .  The patient's history has been reviewed, patient examined, no change in status, stable for surgery.  I have reviewed the patient's chart and labs.  Questions were answered to the patient's satisfaction.     Leonia Reeves.D.

## 2012-09-20 NOTE — H&P (View-Only) (Signed)
HPI [name] returns today for followup. He is a 76-year-old man with a nonischemic cardiomyopathy, chronic class II systolic heart failure, left bundle branch block, ventricular tachycardia, status post biventricular ICD implantation. In the interim he has been stable. Over the last several years, he has noted some reduction in his exercise ability. He denies syncope. No chest pain or peripheral edema. No cough or hemoptysis. No Known Allergies   Current Outpatient Prescriptions  Medication Sig Dispense Refill  . allopurinol (ZYLOPRIM) 300 MG tablet Take 300 mg by mouth daily.        . amoxicillin (AMOXIL) 500 MG capsule Take 500 mg by mouth every other day.        . captopril (CAPOTEN) 50 MG tablet Take 50 mg by mouth 3 (three) times daily.        . carvedilol (COREG) 25 MG tablet Take 25 mg by mouth 2 (two) times daily with a meal.        . Coenzyme Q10 (CO Q-10 PO) Take by mouth daily.        . furosemide (LASIX) 40 MG tablet Take 40 mg by mouth daily.        . memantine (NAMENDA) 5 MG tablet Take 5 mg by mouth daily.      . Multiple Vitamins-Minerals (MULTIVITAMIN WITH MINERALS) tablet Take 1 tablet by mouth daily.        . Red Yeast Rice Extract (RED YEAST RICE PO) Take by mouth daily.        . rosuvastatin (CRESTOR) 10 MG tablet Take 10 mg by mouth daily.        . traZODone (DESYREL) 50 MG tablet Take 50 mg by mouth at bedtime.        . warfarin (COUMADIN) 5 MG tablet Take 5 mg by mouth as directed.         No current facility-administered medications for this visit.     Past Medical History  Diagnosis Date  . Left bundle-branch block   . Cardiomyopathy     ROS:   All systems reviewed and negative except as noted in the HPI.   Past Surgical History  Procedure Laterality Date  . Doppler echocardiography  2008, 2009  . Aortic valve replacement       Family History  Problem Relation Age of Onset  . Aneurysm Mother     brain  . Heart attack Father 72     History    Social History  . Marital Status: Married    Spouse Name: N/A    Number of Children: N/A  . Years of Education: N/A   Occupational History  . Not on file.   Social History Main Topics  . Smoking status: Never Smoker   . Smokeless tobacco: Not on file  . Alcohol Use: Yes     Comment: 2-3 glasses  . Drug Use: Not on file  . Sexual Activity: Not on file   Other Topics Concern  . Not on file   Social History Narrative  . No narrative on file     BP 119/78  Pulse 70  Ht 5' 9" (1.753 m)  Wt 213 lb 12.8 oz (96.979 kg)  BMI 31.56 kg/m2  Physical Exam:  Well appearing 76-year-old man,NAD HEENT: Unremarkable Neck:  7 cm JVD, no thyromegally Back:  No CVA tenderness Lungs:  Clear with no wheezes, rales, or rhonchi. HEART:  Regular rate rhythm, no murmurs, no rubs, no clicks Abd:  soft, positive bowel sounds,   no organomegally, no rebound, no guarding Ext:  2 plus pulses, no edema, no cyanosis, no clubbing Skin:  No rashes no nodules Neuro:  CN II through XII intact, motor grossly intact  EKG - normal sinus rhythm with biventricular pacing  DEVICE  Normal device function.  See PaceArt for details.   Assess/Plan: 

## 2012-09-20 NOTE — Progress Notes (Signed)
Patient has had left eyelid swelling noted after the procedure. He has been given 12.5 mg IV benadryl.  Lewayne Bunting, M.D.

## 2012-09-20 NOTE — H&P (Signed)
  ICD Criteria  Current LVEF (within 6 months):25%  NYHA Functional Classification: Class II  Heart Failure History:  Yes, Duration of heart failure since onset is > 9 months  Non-Ischemic Dilated Cardiomyopathy History:  Yes, timeframe is < 3 months  Atrial Fibrillation/Atrial Flutter:  Yes, A-Fib/A-Flutter type: Persistent (>7 days).  Ventricular Tachycardia History:  Yes, Hemodynamic instability present, VT Type:  SVT - Monomorphic.  Cardiac Arrest History:  no  History of Syndromes with Risk of Sudden Death:  No.  Previous ICD:  Yes, ICD Type:  CRT-D, Reason for ICD:  Primary prevention.  25%  Electrophysiology Study: No.  Anticoagulation Therapy:  Patient is on anticoagulation therapy, anticoagulation was held prior to procedure.   Beta Blocker Therapy:  Yes.   Ace Inhibitor/ARB Therapy:  Yes.

## 2012-09-26 ENCOUNTER — Telehealth: Payer: Self-pay | Admitting: Nurse Practitioner

## 2012-09-26 NOTE — Telephone Encounter (Signed)
Received a call from pts son this AM.  Pt has been in afib/flutter and is awaiting appropriate duration of therapeutic anticoagulation for cardioversion in the coming wks.  In the interim, he has developed some dyspnea on exertion.  His weight is up ~ 5-6 lbs over the past 2-3 days.  He takes lasix 40mg  daily.  He is on coreg for rate control.  I rec that pt take additional lasix this AM and also this afternoon.  Tomorrow he can take lasix 40mg  bid until weight is back to baseline then reduce dose to 40mg  daily.  If he does not have appropriate response to lasix, breathing worsens, or weight cont to climb, he should either call back or go to the ED depending on severity of Ss.  Son verbalizes understanding and will call back if needed.

## 2012-09-28 DIAGNOSIS — Z7901 Long term (current) use of anticoagulants: Secondary | ICD-10-CM | POA: Diagnosis not present

## 2012-09-30 ENCOUNTER — Ambulatory Visit (INDEPENDENT_AMBULATORY_CARE_PROVIDER_SITE_OTHER): Payer: Medicare Other | Admitting: *Deleted

## 2012-09-30 DIAGNOSIS — I5022 Chronic systolic (congestive) heart failure: Secondary | ICD-10-CM | POA: Diagnosis not present

## 2012-09-30 DIAGNOSIS — I472 Ventricular tachycardia: Secondary | ICD-10-CM | POA: Diagnosis not present

## 2012-09-30 LAB — ICD DEVICE OBSERVATION
ATRIAL PACING ICD: 0.3 pct
LV LEAD THRESHOLD: 0.875 V

## 2012-09-30 NOTE — Progress Notes (Signed)
Wound check appointment. Steri-strips removed. Wound without redness or edema. Incision edges approximated, wound well healed. Normal device function. Thresholds, sensing, and impedances consistent with implant measurements. Device programmed at 3.5V for extra safety margin until 3 month visit. Histogram distribution appropriate for patient and level of activity.  Patient educated about wound care, arm mobility, lifting restrictions, shock plan. Patient awaiting cardioversion.

## 2012-10-05 DIAGNOSIS — Z7901 Long term (current) use of anticoagulants: Secondary | ICD-10-CM | POA: Diagnosis not present

## 2012-10-06 ENCOUNTER — Telehealth: Payer: Self-pay | Admitting: Internal Medicine

## 2012-10-06 NOTE — Telephone Encounter (Signed)
New Problem  Inr 3.1  Needs Dr. Ladona Ridgel to decide if he needs a TEE cardioversion or continue monitoring INR's. Please call back and advise.

## 2012-10-07 NOTE — Telephone Encounter (Signed)
I left Barbara Cower a message after discussing with Dr Ladona Ridgel that the TEE/ DCCV was discussed with his Dad as an option and his father did not want to go this route.  Dr Ladona Ridgel will be glad to do this if patient wants to proceed, if not will need 2 more weeks of therapeutic INR's

## 2012-10-09 ENCOUNTER — Encounter: Payer: Self-pay | Admitting: Internal Medicine

## 2012-10-11 ENCOUNTER — Encounter: Payer: Self-pay | Admitting: Internal Medicine

## 2012-10-12 ENCOUNTER — Encounter: Payer: Self-pay | Admitting: Internal Medicine

## 2012-10-12 DIAGNOSIS — Z7901 Long term (current) use of anticoagulants: Secondary | ICD-10-CM | POA: Diagnosis not present

## 2012-10-12 LAB — PROTIME-INR: INR: 3.1 — AB (ref 0.9–1.1)

## 2012-10-12 NOTE — Telephone Encounter (Signed)
INR today is 4.1 today and he is up 6 pounds, not urinating.  Barbara Cower called and we have added him to Dr Lubertha Basque schedule tomorrow

## 2012-10-13 ENCOUNTER — Encounter: Payer: Self-pay | Admitting: Internal Medicine

## 2012-10-13 ENCOUNTER — Encounter: Payer: Self-pay | Admitting: *Deleted

## 2012-10-13 ENCOUNTER — Other Ambulatory Visit: Payer: Self-pay | Admitting: *Deleted

## 2012-10-13 ENCOUNTER — Ambulatory Visit (INDEPENDENT_AMBULATORY_CARE_PROVIDER_SITE_OTHER): Payer: Medicare Other | Admitting: Internal Medicine

## 2012-10-13 ENCOUNTER — Telehealth: Payer: Self-pay | Admitting: Internal Medicine

## 2012-10-13 VITALS — BP 118/87 | HR 124 | Ht 70.0 in | Wt 227.8 lb

## 2012-10-13 DIAGNOSIS — I5022 Chronic systolic (congestive) heart failure: Secondary | ICD-10-CM

## 2012-10-13 DIAGNOSIS — I4891 Unspecified atrial fibrillation: Secondary | ICD-10-CM

## 2012-10-13 LAB — BASIC METABOLIC PANEL
BUN: 24 mg/dL — ABNORMAL HIGH (ref 6–23)
Chloride: 100 mEq/L (ref 96–112)
Creatinine, Ser: 1.2 mg/dL (ref 0.4–1.5)
GFR: 64.59 mL/min (ref >60.00)

## 2012-10-13 LAB — PROTIME-INR
INR: 3.3 ratio — ABNORMAL HIGH (ref 0.8–1.0)
Prothrombin Time: 34.5 s — ABNORMAL HIGH (ref 10.2–12.4)

## 2012-10-13 LAB — CBC WITH DIFFERENTIAL/PLATELET
Basophils Absolute: 0 10*3/uL (ref 0.0–0.1)
Basophils Relative: 0.4 % (ref 0.0–3.0)
Eosinophils Absolute: 0 10*3/uL (ref 0.0–0.7)
Eosinophils Relative: 0.5 % (ref 0.0–5.0)
HCT: 40.9 % (ref 39.0–52.0)
Hemoglobin: 13.7 g/dL (ref 13.0–17.0)
Lymphocytes Relative: 19.2 % (ref 12.0–46.0)
MCHC: 33.5 g/dL (ref 30.0–36.0)
MCV: 98.3 fl (ref 78.0–100.0)
Monocytes Absolute: 0.7 10*3/uL (ref 0.1–1.0)
Neutro Abs: 5.2 10*3/uL (ref 1.4–7.7)
Neutrophils Relative %: 70.3 % (ref 43.0–77.0)
RDW: 15.8 % — ABNORMAL HIGH (ref 11.5–14.6)
WBC: 7.4 10*3/uL (ref 4.5–10.5)

## 2012-10-13 MED ORDER — AMIODARONE HCL 400 MG PO TABS: 400.0000 mg | ORAL_TABLET | Freq: Every day | ORAL | Status: DC

## 2012-10-13 NOTE — Assessment & Plan Note (Signed)
I have discussed the treatment options with the patient and his family in detail. I am concerned that he will go back subtherapeutic with regard to his INR. Because his heart failure symptoms are worsening, I recommended that he undergo transesophageal guided cardioversion in the next few days. I will have the patient to start amiodarone, in hopes of maintaining him in sinus rhythm. He is very close to becoming decompensated with regard to his heart failure and his ventricular rate is not well controlled. This will be scheduled in the next few days.

## 2012-10-13 NOTE — Telephone Encounter (Signed)
Walk In pt Form " sealed Envelope" LEft For Belenda Cruise In Device Gave to [place] 10/13/12/KM

## 2012-10-13 NOTE — Progress Notes (Signed)
    HPI [name] returns today for followup. He is a 77-year-old man with chronic systolic heart failure, aortic valve replacement, and recent diagnosis of atrial fibrillation with a rapid ventricular response. He is a history of medical noncompliance. We changed out his ICD several weeks ago. At that time, my hope was that we could cardiovert the patient. Unfortunately his INR was subtherapeutic. Since then his INR has been at times somewhat therapeutic but most recently therapeutic. He has had 2 weeks of therapeutic INRs. My concern is that he will drop below therapeutic. His heart failure symptoms have worsened going from class 2a now class IIIB. He has developed abdominal distention with minimal peripheral edema. He gets short of breath walking across the room. He has become very sedentary. Recently his INR was 4.1. He held his Coumadin for one day. No Known Allergies   Current Outpatient Prescriptions  Medication Sig Dispense Refill  . allopurinol (ZYLOPRIM) 300 MG tablet Take 300 mg by mouth daily.        . amoxicillin (AMOXIL) 500 MG capsule Take 500 mg by mouth every other day.       . captopril (CAPOTEN) 50 MG tablet Take 50 mg by mouth 3 (three) times daily.        . carvedilol (COREG) 25 MG tablet Take 25 mg by mouth 2 (two) times daily with a meal.      . Coenzyme Q10 (CO Q-10 PO) Take by mouth daily.        . colchicine 0.6 MG tablet Take 0.6 mg by mouth as needed.       . furosemide (LASIX) 40 MG tablet Take 40 mg by mouth daily.        . memantine (NAMENDA) 5 MG tablet Take 5 mg by mouth daily.      . Multiple Vitamins-Minerals (MULTIVITAMIN WITH MINERALS) tablet Take 1 tablet by mouth daily.        . Red Yeast Rice Extract (RED YEAST RICE PO) Take 1 tablet by mouth daily.       . rosuvastatin (CRESTOR) 10 MG tablet Take 10 mg by mouth daily.        . traZODone (DESYREL) 50 MG tablet Take 50 mg by mouth at bedtime.        . warfarin (COUMADIN) 5 MG tablet Take 2.5-5 mg by  mouth daily. Takes 5 mg on Sunday, Monday and Friday and 2.5 mg on Wednesday       No current facility-administered medications for this visit.     Past Medical History  Diagnosis Date  . Left bundle-branch block   . Cardiomyopathy     ROS:   All systems reviewed and negative except as noted in the HPI.   Past Surgical History  Procedure Laterality Date  . Doppler echocardiography  2008, 2009  . Aortic valve replacement       Family History  Problem Relation Age of Onset  . Aneurysm Mother     brain  . Heart attack Father 72     History   Social History  . Marital Status: Married    Spouse Name: N/A    Number of Children: N/A  . Years of Education: N/A   Occupational History  . Not on file.   Social History Main Topics  . Smoking status: Never Smoker   . Smokeless tobacco: Not on file  . Alcohol Use: Yes     Comment: 2-3 glasses  . Drug Use: Not   on file  . Sexual Activity: Not on file   Other Topics Concern  . Not on file   Social History Narrative  . No narrative on file     BP 118/87  Pulse 124  Ht 5' 10" (1.778 m)  Wt 227 lb 12.8 oz (103.329 kg)  BMI 32.69 kg/m2  Physical Exam:  stable appearing 77-year-old man, NAD HEENT: Unremarkable Neck:  8 cm JVD, no thyromegally Back:  No CVA tenderness Lungs:  Clear with rales in the bases with no wheezes or rhonchi. HEART:  Regular tachycardia rhythm, no murmurs, no rubs, no clicks Abd:  soft, distended, positive bowel sounds, no organomegally, no rebound, no guarding Ext:  2 plus pulses, trace peripheral edema, no cyanosis, no clubbing Skin:  No rashes no nodules Neuro:  CN II through XII intact, motor grossly intact  EKG - atrial flutter with a rapid ventricular response   Assess/Plan: 

## 2012-10-13 NOTE — Assessment & Plan Note (Signed)
Hopefully his heart failure symptoms will improve after restoration of sinus rhythm. he will continue a low-sodium diet and his extra dose of Lasix.

## 2012-10-13 NOTE — Patient Instructions (Addendum)
Your physician has recommended you make the following change in your medication:   1. Start Amiodarone 400 mg 1 tab daily   2. Continue warfarin starting tommorrow   Your physician recommends that you return for lab work today :  CBC,PTINR,BMP  Your physician has requested that you have a TEE/Cardioversion. During a TEE, sound waves are used to create images of your heart. It provides your doctor with information about the size and shape of your heart and how well your heart's chambers and valves are working. In this test, a transducer is attached to the end of a flexible tube that is guided down you throat and into your esophagus (the tube leading from your mouth to your stomach) to get a more detailed image of your heart. Once the TEE has determined that a blood clot is not present, the cardioversion begins. Electrical Cardioversion uses a jolt of electricity to your heart either through paddles or wired patches attached to your chest. This is a controlled, usually prescheduled, procedure. This procedure is done at the hospital and you are not awake during the procedure. You usually go home the day of the procedure. Please see the instruction sheet given to you today for more information.

## 2012-10-14 ENCOUNTER — Encounter: Payer: Self-pay | Admitting: Internal Medicine

## 2012-10-15 ENCOUNTER — Encounter (HOSPITAL_COMMUNITY)
Admission: RE | Disposition: A | Discharge status: Home Health | Payer: Self-pay | Source: Ambulatory Visit | Attending: Internal Medicine

## 2012-10-15 ENCOUNTER — Inpatient Hospital Stay (HOSPITAL_COMMUNITY)
Admission: RE | Admit: 2012-10-15 | Discharge: 2012-10-26 | DRG: 291 | Disposition: A | Discharge status: Home Health | Payer: Medicare Other | Source: Ambulatory Visit | Attending: Internal Medicine | Admitting: Internal Medicine

## 2012-10-15 ENCOUNTER — Encounter (HOSPITAL_COMMUNITY): Payer: Medicare Other | Admitting: Certified Registered Nurse Anesthetist

## 2012-10-15 ENCOUNTER — Ambulatory Visit (HOSPITAL_COMMUNITY): Payer: Medicare Other | Admitting: Certified Registered Nurse Anesthetist

## 2012-10-15 ENCOUNTER — Observation Stay (HOSPITAL_COMMUNITY): Payer: Medicare Other

## 2012-10-15 ENCOUNTER — Encounter (HOSPITAL_COMMUNITY): Payer: Self-pay | Admitting: *Deleted

## 2012-10-15 DIAGNOSIS — I130 Hypertensive heart and chronic kidney disease with heart failure and stage 1 through stage 4 chronic kidney disease, or unspecified chronic kidney disease: Secondary | ICD-10-CM | POA: Diagnosis present

## 2012-10-15 DIAGNOSIS — I4891 Unspecified atrial fibrillation: Secondary | ICD-10-CM | POA: Diagnosis present

## 2012-10-15 DIAGNOSIS — J90 Pleural effusion, not elsewhere classified: Secondary | ICD-10-CM | POA: Diagnosis not present

## 2012-10-15 DIAGNOSIS — E872 Acidosis, unspecified: Secondary | ICD-10-CM | POA: Diagnosis present

## 2012-10-15 DIAGNOSIS — E876 Hypokalemia: Secondary | ICD-10-CM | POA: Diagnosis present

## 2012-10-15 DIAGNOSIS — J988 Other specified respiratory disorders: Secondary | ICD-10-CM | POA: Diagnosis not present

## 2012-10-15 DIAGNOSIS — E43 Unspecified severe protein-calorie malnutrition: Secondary | ICD-10-CM | POA: Diagnosis not present

## 2012-10-15 DIAGNOSIS — K5730 Diverticulosis of large intestine without perforation or abscess without bleeding: Secondary | ICD-10-CM | POA: Diagnosis not present

## 2012-10-15 DIAGNOSIS — Z6829 Body mass index (BMI) 29.0-29.9, adult: Secondary | ICD-10-CM | POA: Diagnosis not present

## 2012-10-15 DIAGNOSIS — I501 Left ventricular failure: Secondary | ICD-10-CM

## 2012-10-15 DIAGNOSIS — I509 Heart failure, unspecified: Secondary | ICD-10-CM

## 2012-10-15 DIAGNOSIS — I959 Hypotension, unspecified: Secondary | ICD-10-CM | POA: Diagnosis present

## 2012-10-15 DIAGNOSIS — R188 Other ascites: Secondary | ICD-10-CM | POA: Diagnosis not present

## 2012-10-15 DIAGNOSIS — I5022 Chronic systolic (congestive) heart failure: Secondary | ICD-10-CM

## 2012-10-15 DIAGNOSIS — K7200 Acute and subacute hepatic failure without coma: Secondary | ICD-10-CM | POA: Diagnosis not present

## 2012-10-15 DIAGNOSIS — I38 Endocarditis, valve unspecified: Secondary | ICD-10-CM | POA: Diagnosis present

## 2012-10-15 DIAGNOSIS — I428 Other cardiomyopathies: Secondary | ICD-10-CM | POA: Diagnosis not present

## 2012-10-15 DIAGNOSIS — Z4682 Encounter for fitting and adjustment of non-vascular catheter: Secondary | ICD-10-CM | POA: Diagnosis not present

## 2012-10-15 DIAGNOSIS — N179 Acute kidney failure, unspecified: Secondary | ICD-10-CM

## 2012-10-15 DIAGNOSIS — Z91199 Patient's noncompliance with other medical treatment and regimen due to unspecified reason: Secondary | ICD-10-CM

## 2012-10-15 DIAGNOSIS — I447 Left bundle-branch block, unspecified: Secondary | ICD-10-CM | POA: Diagnosis present

## 2012-10-15 DIAGNOSIS — I878 Other specified disorders of veins: Secondary | ICD-10-CM

## 2012-10-15 DIAGNOSIS — D696 Thrombocytopenia, unspecified: Secondary | ICD-10-CM | POA: Diagnosis present

## 2012-10-15 DIAGNOSIS — J9600 Acute respiratory failure, unspecified whether with hypoxia or hypercapnia: Secondary | ICD-10-CM | POA: Diagnosis not present

## 2012-10-15 DIAGNOSIS — J9601 Acute respiratory failure with hypoxia: Secondary | ICD-10-CM

## 2012-10-15 DIAGNOSIS — R079 Chest pain, unspecified: Secondary | ICD-10-CM | POA: Diagnosis not present

## 2012-10-15 DIAGNOSIS — N183 Chronic kidney disease, stage 3 unspecified: Secondary | ICD-10-CM | POA: Diagnosis present

## 2012-10-15 DIAGNOSIS — Z23 Encounter for immunization: Secondary | ICD-10-CM | POA: Diagnosis not present

## 2012-10-15 DIAGNOSIS — Z95810 Presence of automatic (implantable) cardiac defibrillator: Secondary | ICD-10-CM

## 2012-10-15 DIAGNOSIS — J9620 Acute and chronic respiratory failure, unspecified whether with hypoxia or hypercapnia: Secondary | ICD-10-CM | POA: Diagnosis present

## 2012-10-15 DIAGNOSIS — I998 Other disorder of circulatory system: Secondary | ICD-10-CM | POA: Diagnosis not present

## 2012-10-15 DIAGNOSIS — J811 Chronic pulmonary edema: Secondary | ICD-10-CM

## 2012-10-15 DIAGNOSIS — Z954 Presence of other heart-valve replacement: Secondary | ICD-10-CM

## 2012-10-15 DIAGNOSIS — K759 Inflammatory liver disease, unspecified: Secondary | ICD-10-CM | POA: Diagnosis not present

## 2012-10-15 DIAGNOSIS — Z9119 Patient's noncompliance with other medical treatment and regimen: Secondary | ICD-10-CM

## 2012-10-15 DIAGNOSIS — R109 Unspecified abdominal pain: Secondary | ICD-10-CM | POA: Diagnosis not present

## 2012-10-15 DIAGNOSIS — I5023 Acute on chronic systolic (congestive) heart failure: Principal | ICD-10-CM

## 2012-10-15 DIAGNOSIS — R0602 Shortness of breath: Secondary | ICD-10-CM | POA: Diagnosis not present

## 2012-10-15 DIAGNOSIS — I429 Cardiomyopathy, unspecified: Secondary | ICD-10-CM

## 2012-10-15 HISTORY — DX: Chronic systolic (congestive) heart failure: I50.22

## 2012-10-15 HISTORY — PX: TEE WITHOUT CARDIOVERSION: SHX5443

## 2012-10-15 LAB — BLOOD GAS, ARTERIAL
Bicarbonate: 23.5 mEq/L (ref 20.0–24.0)
Drawn by: 330991
O2 Saturation: 94 %
Patient temperature: 98.6
pCO2 arterial: 55.3 mmHg — ABNORMAL HIGH (ref 35.0–45.0)
pH, Arterial: 7.252 — ABNORMAL LOW (ref 7.350–7.450)
pO2, Arterial: 82.5 mmHg (ref 80.0–100.0)

## 2012-10-15 LAB — CBC WITH DIFFERENTIAL/PLATELET
Basophils Absolute: 0 10*3/uL (ref 0.0–0.1)
Basophils Relative: 0 % (ref 0–1)
Eosinophils Absolute: 0 10*3/uL (ref 0.0–0.7)
Eosinophils Relative: 0 % (ref 0–5)
Lymphocytes Relative: 14 % (ref 12–46)
Lymphs Abs: 1 10*3/uL (ref 0.7–4.0)
MCH: 33.3 pg (ref 26.0–34.0)
Neutro Abs: 5.9 10*3/uL (ref 1.7–7.7)
Neutrophils Relative %: 81 % — ABNORMAL HIGH (ref 43–77)
Platelets: 117 10*3/uL — ABNORMAL LOW (ref 150–400)
RBC: 4.3 MIL/uL (ref 4.22–5.81)
RDW: 15.1 % (ref 11.5–15.5)
WBC: 7.3 10*3/uL (ref 4.0–10.5)

## 2012-10-15 LAB — BASIC METABOLIC PANEL
CO2: 23 mEq/L (ref 19–32)
Calcium: 8.5 mg/dL (ref 8.4–10.5)
Creatinine, Ser: 1.21 mg/dL (ref 0.50–1.35)
GFR calc Af Amer: 66 mL/min — ABNORMAL LOW (ref >90)
GFR calc non Af Amer: 57 mL/min — ABNORMAL LOW (ref >90)
Potassium: 4.2 mEq/L (ref 3.5–5.1)
Sodium: 139 mEq/L (ref 135–145)

## 2012-10-15 LAB — PRO B NATRIURETIC PEPTIDE: Pro B Natriuretic peptide (BNP): 7041 pg/mL — ABNORMAL HIGH (ref 0–450)

## 2012-10-15 SURGERY — ECHOCARDIOGRAM, TRANSESOPHAGEAL
Anesthesia: Moderate Sedation

## 2012-10-15 MED ORDER — ACETAMINOPHEN 325 MG PO TABS: 650.0000 mg | ORAL_TABLET | ORAL | Status: DC | PRN

## 2012-10-15 MED ORDER — NOREPINEPHRINE BITARTRATE 1 MG/ML IJ SOLN
2.0000 ug/min | INTRAVENOUS | Status: DC
  Filled 2012-10-15: qty 16

## 2012-10-15 MED ORDER — MIDAZOLAM HCL 10 MG/2ML IJ SOLN
INTRAMUSCULAR | Status: DC | PRN
  Administered 2012-10-15: 2 mg via INTRAVENOUS
  Administered 2012-10-15: 1 mg via INTRAVENOUS

## 2012-10-15 MED ORDER — FENTANYL CITRATE 0.05 MG/ML IJ SOLN
INTRAMUSCULAR | Status: AC
  Filled 2012-10-15: qty 2

## 2012-10-15 MED ORDER — WARFARIN - PHARMACIST DOSING INPATIENT
Freq: Every day | Status: DC
  Administered 2012-10-16 – 2012-10-25 (×6)

## 2012-10-15 MED ORDER — FUROSEMIDE 10 MG/ML IJ SOLN
60.0000 mg | Freq: Two times a day (BID) | INTRAMUSCULAR | Status: DC
  Administered 2012-10-15 – 2012-10-16 (×2): 60 mg via INTRAVENOUS
  Filled 2012-10-15 (×4): qty 6

## 2012-10-15 MED ORDER — ONDANSETRON HCL 4 MG/2ML IJ SOLN: 4.0000 mg | Freq: Four times a day (QID) | INTRAMUSCULAR | Status: DC | PRN

## 2012-10-15 MED ORDER — SODIUM CHLORIDE 0.9 % IV SOLN
INTRAVENOUS | Status: DC
  Administered 2012-10-15: 10:00:00 via INTRAVENOUS

## 2012-10-15 MED ORDER — MIDAZOLAM HCL 5 MG/ML IJ SOLN
INTRAMUSCULAR | Status: AC
  Filled 2012-10-15: qty 2

## 2012-10-15 MED ORDER — ALLOPURINOL 300 MG PO TABS
300.0000 mg | ORAL_TABLET | Freq: Every day | ORAL | Status: DC
  Administered 2012-10-16 – 2012-10-20 (×5): 300 mg via ORAL
  Filled 2012-10-15 (×6): qty 1

## 2012-10-15 MED ORDER — SODIUM CHLORIDE 0.9 % IJ SOLN: 10.0000 mL | INTRAMUSCULAR | Status: DC | PRN

## 2012-10-15 MED ORDER — LIDOCAINE VISCOUS 2 % MT SOLN
OROMUCOSAL | Status: DC | PRN
  Administered 2012-10-15: 10 mL via OROMUCOSAL

## 2012-10-15 MED ORDER — SODIUM CHLORIDE 0.9 % IJ SOLN
3.0000 mL | Freq: Two times a day (BID) | INTRAMUSCULAR | Status: DC
  Administered 2012-10-16 – 2012-10-17 (×3): 3 mL via INTRAVENOUS

## 2012-10-15 MED ORDER — AMOXICILLIN 500 MG PO CAPS
500.0000 mg | ORAL_CAPSULE | Freq: Once | ORAL | Status: DC
  Filled 2012-10-15: qty 1

## 2012-10-15 MED ORDER — MORPHINE SULFATE 2 MG/ML IJ SOLN
INTRAMUSCULAR | Status: AC
  Administered 2012-10-15: 2 mg via INTRAVENOUS
  Filled 2012-10-15: qty 1

## 2012-10-15 MED ORDER — MIDAZOLAM HCL 2 MG/2ML IJ SOLN
INTRAMUSCULAR | Status: AC
  Filled 2012-10-15: qty 4

## 2012-10-15 MED ORDER — LIDOCAINE VISCOUS 2 % MT SOLN
OROMUCOSAL | Status: AC
  Filled 2012-10-15: qty 15

## 2012-10-15 MED ORDER — LEVALBUTEROL HCL 0.63 MG/3ML IN NEBU
INHALATION_SOLUTION | RESPIRATORY_TRACT | Status: AC
  Administered 2012-10-15: 15:00:00
  Filled 2012-10-15: qty 3

## 2012-10-15 MED ORDER — SODIUM CHLORIDE 0.9 % IV SOLN
INTRAVENOUS | Status: DC
  Administered 2012-10-15: 20 mL/h via INTRAVENOUS
  Administered 2012-10-17: 17:00:00 via INTRAVENOUS

## 2012-10-15 MED ORDER — CAPTOPRIL 50 MG PO TABS
50.0000 mg | ORAL_TABLET | Freq: Three times a day (TID) | ORAL | Status: DC
  Filled 2012-10-15 (×5): qty 1

## 2012-10-15 MED ORDER — AMIODARONE HCL IN DEXTROSE 360-4.14 MG/200ML-% IV SOLN
30.0000 mg/h | INTRAVENOUS | Status: DC
  Administered 2012-10-15 – 2012-10-18 (×7): 30 mg/h via INTRAVENOUS
  Filled 2012-10-15 (×15): qty 200

## 2012-10-15 MED ORDER — SODIUM CHLORIDE 0.9 % IV SOLN
25.0000 ug/h | INTRAVENOUS | Status: DC
  Administered 2012-10-15: 25 ug/h via INTRAVENOUS
  Administered 2012-10-17: 5 ug/h via INTRAVENOUS
  Filled 2012-10-15 (×2): qty 50

## 2012-10-15 MED ORDER — ATORVASTATIN CALCIUM 20 MG PO TABS
20.0000 mg | ORAL_TABLET | Freq: Every day | ORAL | Status: DC
  Administered 2012-10-16 – 2012-10-19 (×4): 20 mg via ORAL
  Filled 2012-10-15 (×6): qty 1

## 2012-10-15 MED ORDER — NITROGLYCERIN 0.4 MG SL SUBL
SUBLINGUAL_TABLET | SUBLINGUAL | Status: AC
  Administered 2012-10-15: 15:00:00
  Filled 2012-10-15: qty 25

## 2012-10-15 MED ORDER — SODIUM CHLORIDE 0.9 % IV SOLN: 250.0000 mL | INTRAVENOUS | Status: DC | PRN

## 2012-10-15 MED ORDER — MIDAZOLAM HCL 2 MG/2ML IJ SOLN
2.0000 mg | INTRAMUSCULAR | Status: DC | PRN
  Administered 2012-10-15 – 2012-10-17 (×13): 2 mg via INTRAVENOUS
  Filled 2012-10-15 (×5): qty 2
  Filled 2012-10-15: qty 4
  Filled 2012-10-15 (×6): qty 2

## 2012-10-15 MED ORDER — NITROGLYCERIN IN D5W 200-5 MCG/ML-% IV SOLN
INTRAVENOUS | Status: AC
  Filled 2012-10-15: qty 250

## 2012-10-15 MED ORDER — SODIUM CHLORIDE 0.9 % IV SOLN: INTRAVENOUS | Status: DC

## 2012-10-15 MED ORDER — ACETAMINOPHEN 650 MG RE SUPP: 650.0000 mg | Freq: Four times a day (QID) | RECTAL | Status: DC | PRN

## 2012-10-15 MED ORDER — ETOMIDATE 2 MG/ML IV SOLN: 16.5000 mg | INTRAVENOUS | Status: AC | PRN

## 2012-10-15 MED ORDER — METOPROLOL TARTRATE 1 MG/ML IV SOLN
INTRAVENOUS | Status: AC
  Filled 2012-10-15: qty 5

## 2012-10-15 MED ORDER — LEVALBUTEROL HCL 0.63 MG/3ML IN NEBU: 0.6300 mg | INHALATION_SOLUTION | RESPIRATORY_TRACT | Status: DC | PRN

## 2012-10-15 MED ORDER — FENTANYL BOLUS VIA INFUSION
25.0000 ug | Freq: Four times a day (QID) | INTRAVENOUS | Status: DC | PRN
  Administered 2012-10-16 – 2012-10-17 (×2): 100 ug via INTRAVENOUS
  Filled 2012-10-15: qty 100

## 2012-10-15 MED ORDER — LEVALBUTEROL HCL 0.63 MG/3ML IN NEBU
0.6300 mg | INHALATION_SOLUTION | Freq: Four times a day (QID) | RESPIRATORY_TRACT | Status: DC | PRN
Start: 2012-10-15 — End: 2012-10-15
  Administered 2012-10-15: 0.63 mg via RESPIRATORY_TRACT
  Filled 2012-10-15: qty 3

## 2012-10-15 MED ORDER — ADULT MULTIVITAMIN W/MINERALS CH
1.0000 | ORAL_TABLET | Freq: Every day | ORAL | Status: DC
  Administered 2012-10-16 – 2012-10-26 (×11): 1 via ORAL
  Filled 2012-10-15 (×12): qty 1

## 2012-10-15 MED ORDER — LEVALBUTEROL HCL 0.63 MG/3ML IN NEBU
0.6300 mg | INHALATION_SOLUTION | Freq: Four times a day (QID) | RESPIRATORY_TRACT | Status: DC
  Administered 2012-10-15 – 2012-10-20 (×20): 0.63 mg via RESPIRATORY_TRACT
  Filled 2012-10-15 (×28): qty 3

## 2012-10-15 MED ORDER — FAMOTIDINE IN NACL 20-0.9 MG/50ML-% IV SOLN
20.0000 mg | Freq: Two times a day (BID) | INTRAVENOUS | Status: DC
  Administered 2012-10-15 – 2012-10-18 (×7): 20 mg via INTRAVENOUS
  Filled 2012-10-15 (×13): qty 50

## 2012-10-15 MED ORDER — NOREPINEPHRINE BITARTRATE 1 MG/ML IJ SOLN
2.0000 ug/min | INTRAVENOUS | Status: DC
  Administered 2012-10-15: 5 ug/min via INTRAVENOUS
  Administered 2012-10-15: 10 ug/min via INTRAVENOUS
  Administered 2012-10-15: 5 ug/min via INTRAVENOUS
  Administered 2012-10-15: 15 ug/min via INTRAVENOUS
  Filled 2012-10-15 (×2): qty 16

## 2012-10-15 MED ORDER — ROCURONIUM BROMIDE 50 MG/5ML IV SOLN
55.0000 mg | INTRAVENOUS | Status: AC | PRN
  Filled 2012-10-15: qty 12

## 2012-10-15 MED ORDER — MEMANTINE HCL 5 MG PO TABS
5.0000 mg | ORAL_TABLET | Freq: Every day | ORAL | Status: DC
  Filled 2012-10-15: qty 1

## 2012-10-15 MED ORDER — MULTI-VITAMIN/MINERALS PO TABS: 1.0000 | ORAL_TABLET | Freq: Every day | ORAL | Status: DC

## 2012-10-15 MED ORDER — WARFARIN - PHYSICIAN DOSING INPATIENT: Freq: Every day | Status: DC

## 2012-10-15 MED ORDER — AMIODARONE HCL 200 MG PO TABS
400.0000 mg | ORAL_TABLET | Freq: Every day | ORAL | Status: DC
  Filled 2012-10-15: qty 2

## 2012-10-15 MED ORDER — FENTANYL CITRATE 0.05 MG/ML IJ SOLN
INTRAMUSCULAR | Status: DC | PRN
  Administered 2012-10-15 (×2): 25 ug via INTRAVENOUS

## 2012-10-15 MED ORDER — TRAZODONE HCL 50 MG PO TABS
50.0000 mg | ORAL_TABLET | Freq: Every day | ORAL | Status: DC
  Administered 2012-10-16 – 2012-10-25 (×10): 50 mg via ORAL
  Filled 2012-10-15 (×12): qty 1

## 2012-10-15 MED ORDER — WARFARIN 1.25 MG HALF TABLET
1.2500 mg | ORAL_TABLET | Freq: Once | ORAL | Status: DC
  Filled 2012-10-15: qty 1

## 2012-10-15 MED ORDER — FUROSEMIDE 10 MG/ML IJ SOLN: 40.0000 mg | Freq: Once | INTRAMUSCULAR | Status: DC

## 2012-10-15 MED ORDER — WARFARIN SODIUM 2.5 MG PO TABS
2.5000 mg | ORAL_TABLET | Freq: Every day | ORAL | Status: DC
  Filled 2012-10-15: qty 1

## 2012-10-15 MED ORDER — FENTANYL CITRATE 0.05 MG/ML IJ SOLN
50.0000 ug | INTRAMUSCULAR | Status: DC | PRN
  Administered 2012-10-15 (×2): 50 ug via INTRAVENOUS

## 2012-10-15 MED ORDER — AMOXICILLIN 500 MG PO CAPS: 500.0000 mg | ORAL_CAPSULE | ORAL | Status: DC

## 2012-10-15 MED ORDER — SODIUM CHLORIDE 0.9 % IJ SOLN
10.0000 mL | Freq: Two times a day (BID) | INTRAMUSCULAR | Status: DC
  Administered 2012-10-15: 40 mL
  Administered 2012-10-16 – 2012-10-18 (×6): 10 mL

## 2012-10-15 MED ORDER — CARVEDILOL 25 MG PO TABS
25.0000 mg | ORAL_TABLET | Freq: Two times a day (BID) | ORAL | Status: DC
  Administered 2012-10-16: 25 mg via ORAL
  Filled 2012-10-15 (×4): qty 1

## 2012-10-15 MED ORDER — SODIUM CHLORIDE 0.9 % IJ SOLN: 3.0000 mL | INTRAMUSCULAR | Status: DC | PRN

## 2012-10-15 NOTE — Procedures (Signed)
Intubation Procedure Note Jon Butler [id number] [birth year]  Procedure: Intubation Indications: Respiratory insufficiency  Procedure Details Consent: Risks of procedure as well as the alternatives and risks of each were explained to the (patient/caregiver).  Consent for procedure obtained. Time Out: Verified patient identification, verified procedure, site/side was marked, verified correct patient position, special equipment/implants available, medications/allergies/relevent history reviewed, required imaging and test results available.  Performed  Maximum sterile technique was used including antiseptics, cap, gloves, gown, hand hygiene and mask.  MAC #4    Evaluation Hemodynamic Status: BP stable throughout; O2 sats: stable throughout Patient's Current Condition: stable Complications: No apparent complications Patient did tolerate procedure well. Chest X-ray ordered to verify placement.  CXR: pending.   BABCOCK,PETE 10/15/2012  I was present for and supervised the entire procedure  Billy Fischer, MD ; [facility] [phone].  After 5:30 PM or weekends, call [phone]

## 2012-10-15 NOTE — Anesthesia Preprocedure Evaluation (Addendum)
Anesthesia Evaluation  Patient identified by MRN, date of birth, ID bandGeneral Assessment Comment:Sedated for TEE   Reviewed: Allergy & Precautions, H&P , NPO status , Patient's Chart, lab work & pertinent test results, reviewed documented beta blocker date and time   History of Anesthesia Complications Negative for: history of anesthetic complications  Airway       Dental   Pulmonary neg pulmonary ROS,          Cardiovascular Exercise Tolerance: Poor hypertension, Pt. on medications and Pt. on home beta blockers + CAD + dysrhythmias Atrial Fibrillation and Ventricular Tachycardia  Echo 03/09 diffuse left ventricular hypokinesis with regional variations. EF 25%.   Neuro/Psych negative neurological ROS     GI/Hepatic negative GI ROS, Neg liver ROS,   Endo/Other  negative endocrine ROS  Renal/GU negative Renal ROS     Musculoskeletal   Abdominal   Peds  Hematology On coumadin, INR 4.1 10/14/12   Anesthesia Other Findings   Reproductive/Obstetrics                         Anesthesia Physical Anesthesia Plan Anesthesia Quick Evaluation

## 2012-10-15 NOTE — Consult Note (Addendum)
PULMONARY  / CRITICAL CARE MEDICINE  Name: Jon Butler MRN: [id number] DOB: Aug 04, 1935    ADMISSION DATE:  10/15/2012 CONSULTATION DATE:  10/10  REFERRING MD :  Sharrell Ku  PRIMARY SERVICE: Cards  CHIEF COMPLAINT:   Acute respiratory failure   BRIEF PATIENT DESCRIPTION:  77 M with NICM (EF 25%), recent onset AF. Admitted 10/10 for planned TEE cardioversion. Atrial appendage thrombus found on TEE and cardioversion was not performed. Developed severe respiratory distress with pulm edema pattern on CXR and wheezing. Transferred emergently to ICU and PCCM consulted.   SIGNIFICANT EVENTS / STUDIES:  10/10 Admitted for elective cardioversion 10/10 TEE: atrial appendage clot. No cardioversion performed 10/10 severe respiratory distress. Transfer to CCU emergently. Inadequately supported on NPPV. Intubated  LINES / TUBES: ETT 10/10 >>  L femoral CVL 10/10 >>  PICC (ordered 10/10) >>    MICRO: Procalcitonin 10/10:      10/11:       10/12:   Resp 10/10 >>   ANTIBIOTICS: Amoxicillin (chronic) 10/10 >>   HISTORY OF PRESENT ILLNESS:    The patient is a 77 year old man with a nonischemic cardiomyopathy, left bundle branch block, status post biventricular ICD implantation with recent ICD generator change. He has developed atrial fibrillation and worsening heart failure. He has a history of noncompliance. He has been placed on anticoagulation with Coumadin. He had 2 weeks of therapeutic anticoagulation. Prior to this, his anticoagulation has been somewhat therapeutic. He has had worsening heart failure symptoms, requiring escalating doses of Lasix. He was brought into the hospital 10/10 w/ plans to  undergo transesophageal echo guided cardioversion. He was found to have a small clot in his left atrial appendage. He was also noted to have a mass on his defibrillator lead of uncertain clinical significance. He did not receive cardioversion because of the left atrial appendage thrombus present.   Post procedure, the patient developed respiratory distress requiring suctioning and bronchodilator therapy. He will be admitted to the hospital for intravenous Lasix and additional bronchodilator therapy. PCCM was asked to see shortly after admission as he became progressively hypoxic with increased wheeze. PCCM was asked to see emergently in the afternoon of 10/10. Emergently got lasix and NTG, transferred emergently to CICU PCCM asked to see.   PAST MEDICAL HISTORY :  Past Medical History  Diagnosis Date  . Left bundle-branch block   . Cardiomyopathy    Past Surgical History  Procedure Laterality Date  . Doppler echocardiography  2008, 2009  . Aortic valve replacement     Prior to Admission medications   Medication Sig Start Date End Date Taking? Authorizing Provider  allopurinol (ZYLOPRIM) 300 MG tablet Take 300 mg by mouth daily.     Yes Historical Provider, MD  amiodarone (PACERONE) 400 MG tablet Take 1 tablet (400 mg total) by mouth daily. 10/13/12  Yes Marinus Maw, MD  amoxicillin (AMOXIL) 500 MG capsule Take 500 mg by mouth every other day.    Yes Historical Provider, MD  captopril (CAPOTEN) 50 MG tablet Take 50 mg by mouth 3 (three) times daily.     Yes Historical Provider, MD  carvedilol (COREG) 25 MG tablet Take 25 mg by mouth 2 (two) times daily with a meal.   Yes Historical Provider, MD  Coenzyme Q10 (CO Q-10 PO) Take by mouth daily.     Yes Historical Provider, MD  furosemide (LASIX) 40 MG tablet Take 40 mg by mouth daily.     Yes Historical Provider, MD  memantine (NAMENDA) 5 MG tablet Take 5 mg by mouth daily.   Yes Historical Provider, MD  Multiple Vitamins-Minerals (MULTIVITAMIN WITH MINERALS) tablet Take 1 tablet by mouth daily.     Yes Historical Provider, MD  Red Yeast Rice Extract (RED YEAST RICE PO) Take 1 tablet by mouth daily.    Yes Historical Provider, MD  rosuvastatin (CRESTOR) 10 MG tablet Take 10 mg by mouth daily.     Yes Historical Provider, MD  traZODone  (DESYREL) 50 MG tablet Take 50 mg by mouth at bedtime.     Yes Historical Provider, MD  warfarin (COUMADIN) 5 MG tablet Take 2.5-5 mg by mouth daily. Takes 5 mg on "Sunday, Monday and Friday and 2.5 mg on Wednesday   Yes Historical Provider, MD  colchicine 0.6 MG tablet Take 0.6 mg by mouth as needed.     Historical Provider, MD   No Known Allergies  FAMILY HISTORY:  Family History  Problem Relation Age of Onset  . Aneurysm Mother     brain  . Heart attack Father 72   SOCIAL HISTORY:  reports that he has never smoked. He does not have any smokeless tobacco history on file. He reports that he drinks alcohol. He reports that he does not use illicit drugs.  REVIEW OF SYSTEMS:  lavel 5 caveat  SUBJECTIVE:   VITAL SIGNS: Temp:  [97.3 F (36.3 C)-97.6 F (36.4 C)] 97.6 F (36.4 C) (10/10 1429) Pulse Rate:  [90-114] 104 (10/10 1429) Resp:  [11-26] 20 (10/10 1429) BP: (81-119)/(59-99) 116/89 mmHg (10/10 1429) SpO2:  [93 %-100 %] 94 % (10/10 1429) Weight:  [99.791 kg (220 lb)-103.919 kg (229 lb 1.6 oz)] 103.919 kg (229 lb 1.6 oz) (10/10 1235) HEMODYNAMICS:   VENTILATOR SETTINGS:   INTAKE / OUTPUT: Intake/Output     10/09 0701 - 10/10 0700 10/10 0701 - 10/11 0700   I.V. (mL/kg)  31 (0.3)   Total Intake(mL/kg)  31 (0.3)   Net   +31          PHYSICAL EXAMINATION: General: Acute severe distress, cyanosis, anxious Neuro: Cognition intact, no focal deficits HEENT: WNL Cardiovascular: tachy, freq extrasystoles, HS obscured by noisy BS Lungs: diminished anteriorly, bibasilar crackles, diffuse coarse wheezes Abdomen: obese, firm not rigid, diminished BS Ext; cyanotic nail beds, cool, mildly clammy   LABS:  CBC Recent Labs     10/13/12  1232  10/15/12  1310  WBC  7.4  7.3  HGB  13.7  14.3  HCT  40.9  42.2  PLT  114.0*  117*   Coag's Recent Labs     10/13/12  1232  10/14/12  1621  10/15/12  0846  INR  3.3*  4.1*  2.72*   BMET Recent Labs     10/13/12  1232   10/15/12  1310  NA  141  139  K  4.0  4.2  CL  100  102  CO2  30  23  BUN  24*  31*  CREATININE  1.2  1.21  GLUCOSE  106*  135*   Electrolytes Recent Labs     10" /08/14  1232  10/15/12  1310  CALCIUM  8.8  8.5   Sepsis Markers No results found for this basename: LACTICACIDVEN, PROCALCITON, O2SATVEN,  in the last 72 hours ABG No results found for this basename: PHART, PCO2ART, PO2ART,  in the last 72 hours Liver Enzymes No results found for this basename: AST, ALT, ALKPHOS, BILITOT, ALBUMIN,  in the last  72 hours Cardiac Enzymes Recent Labs     10/15/12  1312  PROBNP  7041.0*   Glucose No results found for this basename: GLUCAP,  in the last 72 hours  Imaging No results found.   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) P:   Monitor BMET intermittently Correct electrolytes as indicated   GASTROINTESTINAL A:  No issues P:   SUP: IV famotidine Place OGT after intubation for meds Address nutrition 10/11  HEMATOLOGIC A:  Chronic warfarin Mild thrombocytopenia - predates admission P:  DVT px: full dose warfarin Monitor CBC intermittently Warfarin per pharm  INFECTIOUS A:  No definite infections B pulm infiltrates likely edema - r/o PNA P:   Micro and abx as above  ENDOCRINE A:   Mild stress induced hyperglycemia, no prior DM P:   CBGs q 8 hrs SSI if glu > 180  NEUROLOGIC A:  Agitation without delirium P:   Sedation protocol  TODAY'S SUMMARY:  Discussed with Dr  Ladona Ridgel Family updated in detail  I have personally obtained a history, examined the patient, evaluated laboratory and imaging results, formulated the assessment and plan and placed orders. CRITICAL CARE: The patient is critically ill with multiple organ systems failure and requires high complexity decision making for assessment and support, frequent evaluation and titration of therapies, application of advanced monitoring technologies and extensive interpretation of multiple databases. Critical Care Time devoted to patient care services described in this note is 60 minutes.   Billy Fischer, MD ; [facility] [phone].  After 5:30 PM or weekends, call [phone]  Pulmonary and Critical Care Medicine [facility] Pager: [phone]  10/15/2012, 2:57 PM

## 2012-10-15 NOTE — Progress Notes (Signed)
Utilization review completed. Sinead Hockman RN CCM Case Mgmt  

## 2012-10-15 NOTE — Procedures (Signed)
Central Venous  Catheter Insertion Procedure Note Jon Butler [id number] [birth year]  Procedure: Insertion of Central Venous Catheter Indications: Drug and/or fluid administration and Frequent blood sampling  Procedure Details Consent: Risks of procedure as well as the alternatives and risks of each were explained to the (patient/caregiver).  Consent for procedure obtained. Time Out: Verified patient identification, verified procedure, site/side was marked, verified correct patient position, special equipment/implants available, medications/allergies/relevent history reviewed, required imaging and test results available.  Performed  Maximum sterile technique was used including antiseptics, cap, gloves, gown, hand hygiene, mask and sheet. Skin prep: Chlorhexidine; local anesthetic administered A antimicrobial bonded/coated triple lumen catheter was placed in the right femoral vein due to emergent situation using the Seldinger technique.  Evaluation Blood flow good Complications: No apparent complications Patient did tolerate procedure well. Chest X-ray ordered to verify placement.  CXR: normal.  BABCOCK,PETE 10/15/2012, 4:31 PM  I was present for and supervised the entire procedure  Billy Fischer, MD ; [facility] service Mobile [phone].  After 5:30 PM or weekends, call [phone]

## 2012-10-15 NOTE — Progress Notes (Addendum)
ANTICOAGULATION CONSULT NOTE - Initial Consult  Pharmacy Consult for warfarin  Indication: atrial fibrillation  No Known Allergies  Patient Measurements: Height: 5\' 10"  (177.8 cm) Weight: 229 lb 1.6 oz (103.919 kg) IBW/kg (Calculated) : 73  Vital Signs: Temp: 97.6 F (36.4 C) (10/10 1429) Temp src: Oral (10/10 1429) BP: 116/89 mmHg (10/10 1429) Pulse Rate: 104 (10/10 1429)  Labs:  Recent Labs  10/13/12 1232 10/14/12 1621 10/15/12 0846 10/15/12 1310  HGB 13.7  --   --  14.3  HCT 40.9  --   --  42.2  PLT 114.0*  --   --  117*  LABPROT 34.5*  --  27.9*  --   INR 3.3* 4.1* 2.72*  --   CREATININE 1.2  --   --  1.21    Estimated Creatinine Clearance: 63.7 ml/min (by C-G formula based on Cr of 1.21).   Medical History: Past Medical History  Diagnosis Date  . Left bundle-branch block   . Cardiomyopathy     Medications:  Prescriptions prior to admission  Medication Sig Dispense Refill  . allopurinol (ZYLOPRIM) 300 MG tablet Take 300 mg by mouth daily.        Marland Kitchen amiodarone (PACERONE) 400 MG tablet Take 1 tablet (400 mg total) by mouth daily.  30 tablet  4  . amoxicillin (AMOXIL) 500 MG capsule Take 500 mg by mouth every other day.       . captopril (CAPOTEN) 50 MG tablet Take 50 mg by mouth 3 (three) times daily.        . carvedilol (COREG) 25 MG tablet Take 25 mg by mouth 2 (two) times daily with a meal.      . Coenzyme Q10 (CO Q-10 PO) Take by mouth daily.        . furosemide (LASIX) 40 MG tablet Take 40 mg by mouth daily.        . memantine (NAMENDA) 5 MG tablet Take 5 mg by mouth daily.      . Multiple Vitamins-Minerals (MULTIVITAMIN WITH MINERALS) tablet Take 1 tablet by mouth daily.        . Red Yeast Rice Extract (RED YEAST RICE PO) Take 1 tablet by mouth daily.       . rosuvastatin (CRESTOR) 10 MG tablet Take 10 mg by mouth daily.        . traZODone (DESYREL) 50 MG tablet Take 50 mg by mouth at bedtime.        Marland Kitchen warfarin (COUMADIN) 5 MG tablet Take 2.5-5 mg by  mouth daily. Takes 5 mg on Sunday, Monday and Friday and 2.5 mg on Wednesday      . colchicine 0.6 MG tablet Take 0.6 mg by mouth as needed.         Assessment: 77 y.o.m with recent Afib and worsening heart failure. He had 2 weeks of therapeutic anticoagulation PTA. Brought to hospital to undergo transesophageal echo guided cardioversion. A small clot was found in his left atrial appendage and cardioversion was postponed. Patient's home dose of coumadin could not be ascertained since patient is being intubated and son could not provide relevant history. INR yesterday was elevated at 4.1 and dose was held. INR down to 2.72.    Goal of Therapy:  INR 2-3 Monitor platelets by anticoagulation protocol: Yes   Plan:  1) Warfarin 1.25 mg by mouth once today  2) Noted cardiology plan for additional 2 weeks of anticoagulation with warfarin prior to cardioversion 3) F/u home med rec when  pt improves  Vinnie Level, PharmD.  [place] License #[id number] Application for [place] reciprocity pending  Clinical Pharmacist Pager [phone]  Christoper Fabian, PharmD, BCPS Clinical pharmacist, pager [phone] 10/15/2012   3:16 PM   Addendum -Patient has taken his dose this morning per family -Home dose: 5mg  SuMoFrSa; 2.5mg  Th; skips TuThu  Plan -No futher coumadin today -Daily PT/INR  Harland German, Pharm D 10/15/2012 4:05 PM

## 2012-10-15 NOTE — Progress Notes (Signed)
Echocardiogram 2D Echocardiogram has been performed.  Jon Butler 10/15/2012, 11:13 AM

## 2012-10-15 NOTE — Progress Notes (Signed)
Arrived to insert PICC however BP was 50s/30s.  Will wait until stable to insert PICC.

## 2012-10-15 NOTE — Significant Event (Signed)
Rapid Response Event Note  Overview: called to bedside by RN for patient with increased WOB and cyanotic Time Called: 1407 Arrival Time: 1415 Event Type: Respiratory  Initial Focused Assessment:  Upon arrival to patients room, RN and family at bedside.  Patient is sitting up on the side of the bed, SOB and cyanotic and mottled all over body.  On NRB Mask, Unable to obtain accurate oxygen saturation, extremties very cold.  Dr Ladona Ridgel called to bedside.  Lasix given, ABG ordered, PCXR ordered.  BP 116/89, HR 110's-120's, RR 30's andSOB   Interventions: Paged CCM, Dr. Sung Amabile at bedside,  Lasix given, ABG drawn, PCXR done, EKG done, Bipap started, SL nitro given, foley inserted, with small amount of urine in bag.  Patient placed on zoll and transported to [facility]   Event Summary:   at      at          [facility]

## 2012-10-15 NOTE — H&P (Signed)
Jon Butler is an 77 y.o. male.   Chief Complaint: Shortness of breath HPI: The patient is a 77 year old man with a nonischemic cardiomyopathy, left bundle branch block, status post biventricular ICD implantation with recent ICD generator change. He has developed atrial fibrillation and worsening heart failure. He has a history of noncompliance. He has been placed on anticoagulation with Coumadin. He had 2 weeks of therapeutic anticoagulation. Prior to this, his anticoagulation has been somewhat therapeutic. He has had worsening heart failure symptoms, requiring escalating doses of Lasix. He was brought into the hospital today to undergo transesophageal echo guided cardioversion. He was found to have a small clot in his left atrial appendage. He was also noted to have a mass on his defibrillator lead of uncertain clinical significance. Post procedure, the patient developed respiratory difficulty requiring suctioning and bronchodilator therapy, he has had volume overload for several weeks. He will be admitted to the hospital for intravenous Lasix and additional bronchodilator therapy. He did not receive cardioversion because of the left atrial appendage thrombus present.  Past Medical History  Diagnosis Date  . Left bundle-branch block   . Cardiomyopathy     Past Surgical History  Procedure Laterality Date  . Doppler echocardiography  2008, 2009  . Aortic valve replacement      Family History  Problem Relation Age of Onset  . Aneurysm Mother     brain  . Heart attack Father 40   Social History:  reports that he has never smoked. He does not have any smokeless tobacco history on file. He reports that he drinks alcohol. He reports that he does not use illicit drugs.  Allergies: No Known Allergies  Medications Prior to Admission  Medication Sig Dispense Refill  . allopurinol (ZYLOPRIM) 300 MG tablet Take 300 mg by mouth daily.        Marland Kitchen amiodarone (PACERONE) 400 MG tablet Take 1 tablet  (400 mg total) by mouth daily.  30 tablet  4  . amoxicillin (AMOXIL) 500 MG capsule Take 500 mg by mouth every other day.       . captopril (CAPOTEN) 50 MG tablet Take 50 mg by mouth 3 (three) times daily.        . carvedilol (COREG) 25 MG tablet Take 25 mg by mouth 2 (two) times daily with a meal.      . Coenzyme Q10 (CO Q-10 PO) Take by mouth daily.        . furosemide (LASIX) 40 MG tablet Take 40 mg by mouth daily.        . memantine (NAMENDA) 5 MG tablet Take 5 mg by mouth daily.      . Multiple Vitamins-Minerals (MULTIVITAMIN WITH MINERALS) tablet Take 1 tablet by mouth daily.        . Red Yeast Rice Extract (RED YEAST RICE PO) Take 1 tablet by mouth daily.       . rosuvastatin (CRESTOR) 10 MG tablet Take 10 mg by mouth daily.        . traZODone (DESYREL) 50 MG tablet Take 50 mg by mouth at bedtime.        Marland Kitchen warfarin (COUMADIN) 5 MG tablet Take 2.5-5 mg by mouth daily. Takes 5 mg on Sunday, Monday and Friday and 2.5 mg on Wednesday      . colchicine 0.6 MG tablet Take 0.6 mg by mouth as needed.         Results for orders placed during the hospital encounter of 10/15/12 (from the  past 48 hour(s))  PROTIME-INR     Status: Abnormal   Collection Time    10/15/12  8:46 AM      Result Value Range   Prothrombin Time 27.9 (*) 11.6 - 15.2 seconds   INR 2.72 (*) 0.00 - 1.49   No results found.  ROS - no change in review of system as per previous exams. He has persistent dyspnea and abdominal distention and peripheral edema and shortness of breath with exertion and PND and orthopnea. He has reduced memory. Otherwise all review of systems negative   Physical exam  Stable appearing 77 year old man,  NAD HEENT: Unremarkable Neck:  9 cm JVD, no thyromegally Back:  No CVA tenderness Lungs:  Rales in the bases approximately 1/3 the way up, with no wheezes or rhonchi. HEART:  IRegular rate rhythm, no murmurs, no rubs, no clicks Abd:  Distended, positive bowel sounds, no organomegally, no  rebound, no guarding Ext:  2 plus pulses, 1+ peripheral edema, no cyanosis, no clubbing Skin:  No rashes no nodules Neuro:  CN II through XII intact, motor grossly intact  Blood pressure 108/94, pulse 94, temperature 97.4 F (36.3 C), temperature source Oral, resp. rate 18, height 5\' 10"  (1.778 m), weight 229 lb 1.6 oz (103.919 kg), SpO2 97.00%. Physical Exam  EKG: Atrial fibrillation with a rapid ventricular response  Assessment/Plan 1. Acute on chronic systolic heart failure 2. atrial fibrillation with rapid ventricular response 3. newly diagnosed left atrial appendage thrombus 4. nonischemic cardiomyopathy ejection fraction 25% Recommendations: I recommended the patient be admitted to the hospital for intravenous Lasix and bronchodilator therapy. He will receive 2 additional weeks of anticoagulation with warfarin. She will need to return to the office next week for PT INR. Once he is receive 4 weeks total of systemic anticoagulation, I would anticipate DC cardioversion without the need for additional transesophageal echo. I anticipate he'll be ready for discharge within 24 hours.  Gregg Taylor,M.D. 10/15/2012, 12:37 PM

## 2012-10-15 NOTE — Op Note (Signed)
Full report to follow 

## 2012-10-15 NOTE — Preoperative (Signed)
Beta Blockers   Reason not to administer Beta Blockers:Not Applicable, took carvedilol today. 

## 2012-10-15 NOTE — Interval H&P Note (Signed)
History and Physical Interval Note:  10/15/2012 10:18 AM  Jon Butler  has presented today for surgery, with the diagnosis of A FIB  The various methods of treatment have been discussed with the patient and family. After consideration of risks, benefits and other options for treatment, the patient has consented to  Procedure(s): TRANSESOPHAGEAL ECHOCARDIOGRAM (TEE) (N/A) CARDIOVERSION (N/A) as a surgical intervention .  The patient's history has been reviewed, patient examined, no change in status, stable for surgery.  I have reviewed the patient's chart and labs.  Questions were answered to the patient's satisfaction.     Dietrich Pates

## 2012-10-15 NOTE — H&P (View-Only) (Signed)
HPI Jon Butler returns today for followup. He is a 77 year old man with chronic systolic heart failure, aortic valve replacement, and recent diagnosis of atrial fibrillation with a rapid ventricular response. He is a history of medical noncompliance. We changed out his ICD several weeks ago. At that time, my hope was that we could cardiovert the patient. Unfortunately his INR was subtherapeutic. Since then his INR has been at times somewhat therapeutic but most recently therapeutic. He has had 2 weeks of therapeutic INRs. My concern is that he will drop below therapeutic. His heart failure symptoms have worsened going from class 2a now class IIIB. He has developed abdominal distention with minimal peripheral edema. He gets short of breath walking across the room. He has become very sedentary. Recently his INR was 4.1. He held his Coumadin for one day. No Known Allergies   Current Outpatient Prescriptions  Medication Sig Dispense Refill  . allopurinol (ZYLOPRIM) 300 MG tablet Take 300 mg by mouth daily.        Marland Kitchen amoxicillin (AMOXIL) 500 MG capsule Take 500 mg by mouth every other day.       . captopril (CAPOTEN) 50 MG tablet Take 50 mg by mouth 3 (three) times daily.        . carvedilol (COREG) 25 MG tablet Take 25 mg by mouth 2 (two) times daily with a meal.      . Coenzyme Q10 (CO Q-10 PO) Take by mouth daily.        . colchicine 0.6 MG tablet Take 0.6 mg by mouth as needed.       . furosemide (LASIX) 40 MG tablet Take 40 mg by mouth daily.        . memantine (NAMENDA) 5 MG tablet Take 5 mg by mouth daily.      . Multiple Vitamins-Minerals (MULTIVITAMIN WITH MINERALS) tablet Take 1 tablet by mouth daily.        . Red Yeast Rice Extract (RED YEAST RICE PO) Take 1 tablet by mouth daily.       . rosuvastatin (CRESTOR) 10 MG tablet Take 10 mg by mouth daily.        . traZODone (DESYREL) 50 MG tablet Take 50 mg by mouth at bedtime.        Marland Kitchen warfarin (COUMADIN) 5 MG tablet Take 2.5-5 mg by  mouth daily. Takes 5 mg on Sunday, Monday and Friday and 2.5 mg on Wednesday       No current facility-administered medications for this visit.     Past Medical History  Diagnosis Date  . Left bundle-branch block   . Cardiomyopathy     ROS:   All systems reviewed and negative except as noted in the HPI.   Past Surgical History  Procedure Laterality Date  . Doppler echocardiography  2008, 2009  . Aortic valve replacement       Family History  Problem Relation Age of Onset  . Aneurysm Mother     brain  . Heart attack Father 56     History   Social History  . Marital Status: Married    Spouse Name: N/A    Number of Children: N/A  . Years of Education: N/A   Occupational History  . Not on file.   Social History Main Topics  . Smoking status: Never Smoker   . Smokeless tobacco: Not on file  . Alcohol Use: Yes     Comment: 2-3 glasses  . Drug Use: Not  on file  . Sexual Activity: Not on file   Other Topics Concern  . Not on file   Social History Narrative  . No narrative on file     BP 118/87  Pulse 124  Ht 5\' 10"  (1.778 m)  Wt 227 lb 12.8 oz (103.329 kg)  BMI 32.69 kg/m2  Physical Exam:  stable appearing 77 year old man, NAD HEENT: Unremarkable Neck:  8 cm JVD, no thyromegally Back:  No CVA tenderness Lungs:  Clear with rales in the bases with no wheezes or rhonchi. HEART:  Regular tachycardia rhythm, no murmurs, no rubs, no clicks Abd:  soft, distended, positive bowel sounds, no organomegally, no rebound, no guarding Ext:  2 plus pulses, trace peripheral edema, no cyanosis, no clubbing Skin:  No rashes no nodules Neuro:  CN II through XII intact, motor grossly intact  EKG - atrial flutter with a rapid ventricular response   Assess/Plan:

## 2012-10-16 ENCOUNTER — Observation Stay (HOSPITAL_COMMUNITY): Payer: Medicare Other

## 2012-10-16 DIAGNOSIS — Z95810 Presence of automatic (implantable) cardiac defibrillator: Secondary | ICD-10-CM

## 2012-10-16 DIAGNOSIS — J9600 Acute respiratory failure, unspecified whether with hypoxia or hypercapnia: Secondary | ICD-10-CM

## 2012-10-16 DIAGNOSIS — I501 Left ventricular failure: Secondary | ICD-10-CM

## 2012-10-16 DIAGNOSIS — I428 Other cardiomyopathies: Secondary | ICD-10-CM

## 2012-10-16 DIAGNOSIS — J811 Chronic pulmonary edema: Secondary | ICD-10-CM | POA: Diagnosis not present

## 2012-10-16 LAB — POCT I-STAT 3, ART BLOOD GAS (G3+)
Acid-Base Excess: 2 mmol/L (ref 0.0–2.0)
O2 Saturation: 98 %
Patient temperature: 98
TCO2: 28 mmol/L (ref 0–100)
pO2, Arterial: 109 mmHg — ABNORMAL HIGH (ref 80.0–100.0)

## 2012-10-16 LAB — BASIC METABOLIC PANEL
BUN: 35 mg/dL — ABNORMAL HIGH (ref 6–23)
BUN: 36 mg/dL — ABNORMAL HIGH (ref 6–23)
CO2: 27 mEq/L (ref 19–32)
Calcium: 7.9 mg/dL — ABNORMAL LOW (ref 8.4–10.5)
Chloride: 104 mEq/L (ref 96–112)
Chloride: 104 mEq/L (ref 96–112)
Creatinine, Ser: 1.52 mg/dL — ABNORMAL HIGH (ref 0.50–1.35)
Creatinine, Ser: 1.23 mg/dL (ref 0.50–1.35)
GFR calc Af Amer: 65 mL/min — ABNORMAL LOW (ref >90)
Glucose, Bld: 117 mg/dL — ABNORMAL HIGH (ref 70–99)
Potassium: 3.8 mEq/L (ref 3.5–5.1)

## 2012-10-16 LAB — CBC
HCT: 40.9 % (ref 39.0–52.0)
MCH: 33.1 pg (ref 26.0–34.0)
MCV: 97.4 fL (ref 78.0–100.0)
Platelets: 121 10*3/uL — ABNORMAL LOW (ref 150–400)
RBC: 4.2 MIL/uL — ABNORMAL LOW (ref 4.22–5.81)
RDW: 15.2 % (ref 11.5–15.5)
WBC: 13 10*3/uL — ABNORMAL HIGH (ref 4.0–10.5)

## 2012-10-16 LAB — PROTIME-INR: Prothrombin Time: 37.7 seconds — ABNORMAL HIGH (ref 11.6–15.2)

## 2012-10-16 MED ORDER — CHLORHEXIDINE GLUCONATE 0.12 % MT SOLN
15.0000 mL | Freq: Two times a day (BID) | OROMUCOSAL | Status: DC
  Administered 2012-10-16 – 2012-10-17 (×3): 15 mL via OROMUCOSAL
  Filled 2012-10-16 (×3): qty 15

## 2012-10-16 MED ORDER — FUROSEMIDE 10 MG/ML IJ SOLN
80.0000 mg | Freq: Two times a day (BID) | INTRAMUSCULAR | Status: DC
  Administered 2012-10-16 – 2012-10-18 (×4): 80 mg via INTRAVENOUS
  Filled 2012-10-16 (×5): qty 8

## 2012-10-16 MED ORDER — VITAL AF 1.2 CAL PO LIQD
1000.0000 mL | ORAL | Status: DC
  Administered 2012-10-16: 1000 mL
  Filled 2012-10-16 (×2): qty 1000

## 2012-10-16 MED ORDER — PRO-STAT SUGAR FREE PO LIQD
60.0000 mL | Freq: Three times a day (TID) | ORAL | Status: DC
  Administered 2012-10-16 – 2012-10-17 (×3): 60 mL
  Filled 2012-10-16 (×5): qty 60

## 2012-10-16 MED ORDER — BIOTENE DRY MOUTH MT LIQD
15.0000 mL | Freq: Four times a day (QID) | OROMUCOSAL | Status: DC
  Administered 2012-10-16 – 2012-10-17 (×7): 15 mL via OROMUCOSAL

## 2012-10-16 MED ORDER — INSULIN ASPART 100 UNIT/ML ~~LOC~~ SOLN
1.0000 [IU] | SUBCUTANEOUS | Status: DC
  Administered 2012-10-16: 2 [IU] via SUBCUTANEOUS
  Administered 2012-10-17 (×3): 1 [IU] via SUBCUTANEOUS
  Administered 2012-10-17 (×2): 2 [IU] via SUBCUTANEOUS
  Administered 2012-10-18 (×2): 1 [IU] via SUBCUTANEOUS

## 2012-10-16 MED ORDER — SODIUM CHLORIDE 0.9 % IV SOLN
3.0000 g | Freq: Four times a day (QID) | INTRAVENOUS | Status: DC
  Administered 2012-10-16 – 2012-10-19 (×12): 3 g via INTRAVENOUS
  Filled 2012-10-16 (×14): qty 3

## 2012-10-16 MED ORDER — VITAL HIGH PROTEIN PO LIQD
1000.0000 mL | ORAL | Status: DC
  Administered 2012-10-16: 1000 mL
  Filled 2012-10-16 (×2): qty 1000

## 2012-10-16 MED ORDER — FUROSEMIDE 10 MG/ML IJ SOLN
INTRAMUSCULAR | Status: AC
  Filled 2012-10-16: qty 8

## 2012-10-16 NOTE — Plan of Care (Signed)
Problem: ICU Phase Progression Outcomes Goal: O2 sats trending toward baseline Outcome: Progressing Tolerated PS  Mode on ventilator x 6 hrs thus far

## 2012-10-16 NOTE — Progress Notes (Signed)
INITIAL NUTRITION ASSESSMENT  DOCUMENTATION CODES Per approved criteria  -Obesity Unspecified   INTERVENTION: Initiate Vital HP at 20 ml/hr and increase by 10 ml every 4 hours to goal rate of 30 ml/hr with Prostat liquid protein 60 ml 3 times daily via tube to provide 1320 kcals (70% of estimated kcal needs), 153 gm protein (100% of estimated protein needs), 602 ml of free water  RD to follow for nutrition care plan  NUTRITION DIAGNOSIS: Inadequate oral intake related to inability to eat as evidenced by NPO status  Goal: Enteral nutrition to provide 60-70% of estimated calorie needs (22-25 kcals/kg ideal body weight) and 100% of estimated protein needs, based on ASPEN guidelines for permissive underfeeding in critically ill obese individuals  Monitor:  EN regimen & tolerance, respiratory status, weight, labs, I/O's  Reason for Assessment: Consult  77 y.o. male  Admitting Dx: shortness of breath  ASSESSMENT: Patient with new onset AF; admitted for planned TEE cardioversion -- atrial appendage thrombus found on TEE and cardioversion was not performed; developed severe respiratory distress with pulmonary edema pattern on CXR and wheezing; transferred emergently to ICU.  Patient is currently intubated on ventilator support ---> OGT in place MV: 9.1 Temp: 36.8  RD consulted for EN initiation & management  Height: Ht Readings from Last 1 Encounters:  10/15/12 5\' 10"  (1.778 m)    Weight: Wt Readings from Last 1 Encounters:  10/16/12 230 lb 6.1 oz (104.5 kg)    Ideal Body Weight: 166 lb  % Ideal Body Weight: 138%  Wt Readings from Last 10 Encounters:  10/16/12 230 lb 6.1 oz (104.5 kg)  10/16/12 230 lb 6.1 oz (104.5 kg)  10/13/12 227 lb 12.8 oz (103.329 kg)  09/20/12 213 lb (96.616 kg)  09/20/12 213 lb (96.616 kg)  08/31/12 213 lb 12.8 oz (96.979 kg)  10/23/11 209 lb (94.802 kg)  11/06/10 209 lb 10.5 oz (95.1 kg)    Usual Body Weight: 227 lb  % Usual Body Weight:  101%  BMI:  Body mass index is 33.06 kg/(m^2).  Estimated Nutritional Needs: Kcal: 1890 Protein: 150-160 gm Fluid: per MD  Skin: Intact  Diet Order: NPO  EDUCATION NEEDS: -No education needs identified at this time   Intake/Output Summary (Last 24 hours) at 10/16/12 1252 Last data filed at 10/16/12 1200  Gross per 24 hour  Intake 1237.23 ml  Output   1100 ml  Net 137.23 ml    Labs:   Recent Labs Lab 10/13/12 1232 10/15/12 1310 10/16/12 0500  NA 141 139 143  K 4.0 4.2 3.8  CL 100 102 104  CO2 30 23 25   BUN 24* 31* 35*  CREATININE 1.2 1.21 1.52*  CALCIUM 8.8 8.5 7.9*  GLUCOSE 106* 135* 117*    CBG (last 3)   Recent Labs  10/15/12 2212  GLUCAP 97    Scheduled Meds: . allopurinol  300 mg Oral Daily  . ampicillin-sulbactam (UNASYN) IVPB 3 g  3 g Intravenous Q6H  . antiseptic oral rinse  15 mL Mouth Rinse QID  . atorvastatin  20 mg Oral q1800  . chlorhexidine  15 mL Mouth Rinse BID  . famotidine (PEPCID) IV  20 mg Intravenous Q12H  . feeding supplement (VITAL AF 1.2 CAL)  1,000 mL Per Tube Q24H  . furosemide  40 mg Intravenous Once  . furosemide  80 mg Intravenous Q12H  . levalbuterol  0.63 mg Nebulization Q6H  . multivitamin with minerals  1 tablet Oral Daily  . sodium  chloride  10-40 mL Intracatheter Q12H  . sodium chloride  3 mL Intravenous Q12H  . traZODone  50 mg Oral QHS  . Warfarin - Pharmacist Dosing Inpatient   Does not apply q1800    Continuous Infusions: . sodium chloride Stopped (10/15/12 1111)  . sodium chloride    . sodium chloride 10 mL/hr at 10/15/12 2000  . amiodarone (NEXTERONE PREMIX) 360 mg/200 mL dextrose 30 mg/hr (10/16/12 1024)  . fentaNYL infusion INTRAVENOUS 50 mcg/hr (10/15/12 2000)  . norepinephrine (LEVOPHED) Adult infusion 10 mcg/min (10/16/12 0636)    Past Medical History  Diagnosis Date  . Left bundle-branch block   . Cardiomyopathy     Past Surgical History  Procedure Laterality Date  . Doppler  echocardiography  2008, 2009  . Aortic valve replacement      Maureen Chatters, RD, LDN Pager #: [phone] After-Hours Pager #: [phone]

## 2012-10-16 NOTE — Consult Note (Signed)
PULMONARY  / CRITICAL CARE MEDICINE  Name: Jon Butler MRN: [id number] DOB: Jan 17, 1935    ADMISSION DATE:  10/15/2012 CONSULTATION DATE:  10/10  REFERRING MD :  Sharrell Ku  PRIMARY SERVICE: Cards  CHIEF COMPLAINT:   Acute respiratory failure   BRIEF PATIENT DESCRIPTION:  89 M with NICM (EF 25%), recent onset AF. Admitted 10/10 for planned TEE cardioversion. Atrial appendage thrombus found on TEE and cardioversion was not performed. Developed severe respiratory distress with pulm edema pattern on CXR and wheezing. Transferred emergently to ICU and PCCM consulted.   SIGNIFICANT EVENTS / STUDIES:  10/10 Admitted for elective cardioversion 10/10 TEE: atrial appendage clot. No cardioversion performed 10/10 severe respiratory distress. Transfer to CCU emergently. Inadequately supported on NPPV. Intubated  LINES / TUBES: ETT 10/10 >>  L femoral CVL 10/10 >> will dc once inr improved PICC (ordered 10/10) >>   MICRO: Procalcitonin 10/10:      10/11:       10/12:   Resp 10/10 >>   ANTIBIOTICS: Amoxicillin (chronic) 10/10 >>   SUBJECTIVE:  Acidosis on abg, even balance  VITAL SIGNS: Temp:  [97.4 F (36.3 C)-99.1 F (37.3 C)] 98.2 F (36.8 C) (10/11 0800) Pulse Rate:  [33-114] 96 (10/11 1124) Resp:  [0-37] 26 (10/11 1124) BP: (52-161)/(29-100) 106/81 mmHg (10/11 1124) SpO2:  [90 %-100 %] 99 % (10/11 1124) FiO2 (%):  [40 %-100 %] 40 % (10/11 1125) Weight:  [103.919 kg (229 lb 1.6 oz)-104.5 kg (230 lb 6.1 oz)] 104.5 kg (230 lb 6.1 oz) (10/11 0500) HEMODYNAMICS:   VENTILATOR SETTINGS: Vent Mode:  [-] PRVC FiO2 (%):  [40 %-100 %] 40 % Set Rate:  [14 bmp] 14 bmp Vt Set:  [600 mL] 600 mL PEEP:  [5 cmH20] 5 cmH20 Plateau Pressure:  [15 cmH20-20 cmH20] 20 cmH20 INTAKE / OUTPUT: Intake/Output     10/10 0701 - 10/11 0700 10/11 0701 - 10/12 0700   I.V. (mL/kg) 889.3 (8.5) 185.2 (1.8)   IV Piggyback 50 50   Total Intake(mL/kg) 939.3 (9) 235.2 (2.3)   Urine (mL/kg/hr) 850 250  (0.5)   Total Output 850 250   Net +89.3 -14.8          PHYSICAL EXAMINATION: General: calm on vent Neuro: Cognition intact, no focal deficits, rass 2 plus now improved HEENT: ett Cardiovascular: s1 s2 IRT, m Lungs: coarse bilateral Abdomen: obese, firm not rigid, diminished BS Ext; no edema   LABS:  CBC Recent Labs     10/13/12  1232  10/15/12  1310  10/16/12  0500  WBC  7.4  7.3  13.0*  HGB  13.7  14.3  13.9  HCT  40.9  42.2  40.9  PLT  114.0*  117*  121*   Coag's Recent Labs     10/14/12  1621  10/15/12  0846  10/16/12  0500  INR  4.1*  2.72*  4.03*   BMET Recent Labs     10/13/12  1232  10/15/12  1310  10/16/12  0500  NA  141  139  143  K  4.0  4.2  3.8  CL  100  102  104  CO2  30  23  25   BUN  24*  31*  35*  CREATININE  1.2  1.21  1.52*  GLUCOSE  106*  135*  117*   Electrolytes Recent Labs     10/13/12  1232  10/15/12  1310  10/16/12  0500  CALCIUM  8.8  8.5  7.9*   Sepsis Markers No results found for this basename: LACTICACIDVEN, PROCALCITON, O2SATVEN,  in the last 72 hours ABG Recent Labs     10/15/12  1436  PHART  7.252*  PCO2ART  55.3*  PO2ART  82.5   Liver Enzymes No results found for this basename: AST, ALT, ALKPHOS, BILITOT, ALBUMIN,  in the last 72 hours Cardiac Enzymes Recent Labs     10/15/12  1312  PROBNP  7041.0*   Glucose Recent Labs     10/15/12  2212  GLUCAP  97    Imaging Dg Chest Port 1 View  10/16/2012   CLINICAL DATA:  Followup pulmonary edema.  EXAM: PORTABLE CHEST - 1 VIEW  COMPARISON:  10/15/2012  FINDINGS: Airspace opacity has improved on the right, mildly improved on the left. The hemidiaphragm outlines are now visible.  No new lung opacities.  Changes from cardiac surgery and the cardiac enlargement are stable.  Endotracheal tube and right PICC are stable in well positioned. New enteric tube passes below the diaphragm into the stomach.  IMPRESSION: Improved pulmonary edema. There is residual airspace  opacity/ edema most evident in the left perihilar and lower lung zone.   Electronically Signed   By: Amie Portland M.D.   On: 10/16/2012 07:37   Dg Chest Port 1 View  10/15/2012   CLINICAL DATA:  PICC placement.  EXAM: PORTABLE CHEST - 1 VIEW  COMPARISON:  Earlier today.  FINDINGS: The endotracheal tube remains in satisfactory position. Interval right PICC with its tip in the inferior aspect of the superior vena cava near its junction with the right atrium. Stable left subclavian pacer and AICD leads. Stable enlarged cardiac silhouette and dense left lower lobe opacity. Slightly more confluent opacity in the mid and lower lung zones elsewhere bilaterally. Continued evidence of bilateral pleural fluid. No acute bony abnormality.  IMPRESSION: 1. Right PICC tip near the cavoatrial junction. 2. Mildly progressive pulmonary edema and bilateral pleural fluid. Underlying pneumonia cannot be excluded.   Electronically Signed   By: Gordan Payment M.D.   On: 10/15/2012 21:56   Dg Chest Port 1 View  10/15/2012   CLINICAL DATA:  Endotracheal tube placement.  EXAM: PORTABLE CHEST - 1 VIEW  COMPARISON:  October 15, 2012.  FINDINGS: There has been interval placement of endotracheal tube with distal tip 4 cm above the carinal. Sternotomy wires are noted. Left-sided pacemaker is unchanged bilateral airspace opacities and effusions are again noted and unchanged.  IMPRESSION: No change involving bilateral airspace opacities and effusions. Endotracheal tube in grossly good position.   Electronically Signed   By: Roque Lias M.D.   On: 10/15/2012 17:36   Dg Chest Port 1 View  10/15/2012   CLINICAL DATA:  77 year old male with shortness of breath.  EXAM: PORTABLE CHEST - 1 VIEW  COMPARISON:  04/02/2007  FINDINGS: Cardiomegaly, cardiac surgical changes and left-sided pacemaker/AICD again noted.  Bilateral airspace opacities are identified - compatible with pulmonary edema.  Mild basilar atelectasis is noted.  No large pleural  effusions are identified on this study.  There is no evidence of pneumothorax or acute bony abnormality.  IMPRESSION: Cardiomegaly with moderate pulmonary edema.   Electronically Signed   By: Laveda Abbe M.D.   On: 10/15/2012 15:22   Dg Abd Portable 1v  10/15/2012   CLINICAL DATA:  Status post orogastric tube placement.  EXAM: PORTABLE ABDOMEN - 1 VIEW  COMPARISON:  None.  FINDINGS: Paucity of intestinal gas. Orogastric tube tip  in the region of the gastroesophageal junction. Intracardiac pacer and AICD leads are noted. Lumbar lower thoracic spine degenerative changes and mild to moderate scoliosis.  IMPRESSION: Orogastric tube tip in the region of the gastroesophageal junction.   Electronically Signed   By: Gordan Payment M.D.   On: 10/15/2012 17:44     CXR: edema pattern, ett wnl, picc wnl  ASSESSMENT / PLAN:  PULMONARY A: Acute respiratory failure Pulmonary edema Bronchospasm - likely cardiac asthma P:   -abg reviewed, repeat for MV , may need increase MV -Low O2 needs -if abg correct ph, will consider SBT, unless levo rising -pcxr in am  -requires neg balance  CARDIOVASCULAR A: Severe cardiomyopathy S/P mechanical aortic valve replacement, remote AFRVR L atrial appendage clot Poor venous access - difficult L femoral CVL placed  P:  Mgmt per Cards Cont anticoagulation PICC placed, assess cvp to help with intravasc volume determination  RENAL A:  Mild ARI (baseline Cr 1.1), slite rise crt P:   Monitor BMET intermittently Agree with lasix to neg balance and observe trend crt in am cvp may help  GASTROINTESTINAL A:  NPO P:   SUP: IV famotidine Place OGT after intubation for meds Address nutrition 10/11, start  HEMATOLOGIC A:  Chronic warfarin Mild thrombocytopenia - predates admission P:  DVT px: full dose warfarin Monitor CBC intermittently Warfarin per pharm May need ffp if secretions bloody worsen, hct stable howevere  INFECTIOUS A:  No definite infections B  pulm infiltrates likely edema - r/o PNA P:   Now on levo, concern oral absorption amox Dc amox Add unasyn Sputum sample Ensure BC sent Dc fem line once inr correcting  ENDOCRINE A:   Mild stress induced hyperglycemia, no prior DM P:   CBGs q 8 hrs SSI if glu > 180  NEUROLOGIC A:  Agitation without delirium P:   Sedation protocol fent with wua  TODAY'S SUMMARY: continue lasix, abg, start TF, wean attempt likely, change to unasyn, BC, sputum, may need ffp if secretions bloody increasing  I have personally obtained a history, examined the patient, evaluated laboratory and imaging results, formulated the assessment and plan and placed orders. CRITICAL CARE: The patient is critically ill with multiple organ systems failure and requires high complexity decision making for assessment and support, frequent evaluation and titration of therapies, application of advanced monitoring technologies and extensive interpretation of multiple databases. Critical Care Time devoted to patient care services described in this note is 35 minutes.   Mcarthur Rossetti. Tyson Alias, MD, FACP Pgr: [phone] [facility]  Pulmonary and Critical Care Medicine [facility] Pager: [phone]  10/16/2012, 11:35 AM

## 2012-10-16 NOTE — Progress Notes (Signed)
SUBJECTIVE:  He is intubated but awake. Confused.  Denies any pain   PHYSICAL EXAM Filed Vitals:   10/16/12 0730 10/16/12 0800 10/16/12 0830 10/16/12 0855  BP: 161/100 125/86 109/83 109/83  Pulse: 111 100 101 102  Temp:      TempSrc:      Resp: 19 19 20 14   Height:      Weight:      SpO2: 100% 98% 97% 98%   General:  No acute distress Lungs:  Decreased breath sounds with crackles Heart:  RRR, distant, mechanical S2 Abdomen:  Positive bowel sounds, no rebound no guarding Extremities:  No edema Neuro:  Awake, nonfocal  LABS:  Results for orders placed during the hospital encounter of 10/15/12 (from the past 24 hour(s))  BASIC METABOLIC PANEL     Status: Abnormal   Collection Time    10/15/12  1:10 PM      Result Value Range   Sodium 139  135 - 145 mEq/L   Potassium 4.2  3.5 - 5.1 mEq/L   Chloride 102  96 - 112 mEq/L   CO2 23  19 - 32 mEq/L   Glucose, Bld 135 (*) 70 - 99 mg/dL   BUN 31 (*) 6 - 23 mg/dL   Creatinine, Ser 6.21  0.50 - 1.35 mg/dL   Calcium 8.5  8.4 - 30.8 mg/dL   GFR calc non Af Amer 57 (*) >90 mL/min   GFR calc Af Amer 66 (*) >90 mL/min  CBC WITH DIFFERENTIAL     Status: Abnormal   Collection Time    10/15/12  1:10 PM      Result Value Range   WBC 7.3  4.0 - 10.5 K/uL   RBC 4.30  4.22 - 5.81 MIL/uL   Hemoglobin 14.3  13.0 - 17.0 g/dL   HCT 65.7  84.6 - 96.2 %   MCV 98.1  78.0 - 100.0 fL   MCH 33.3  26.0 - 34.0 pg   MCHC 33.9  30.0 - 36.0 g/dL   RDW 95.2  84.1 - 32.4 %   Platelets 117 (*) 150 - 400 K/uL   Neutrophils Relative % 81 (*) 43 - 77 %   Neutro Abs 5.9  1.7 - 7.7 K/uL   Lymphocytes Relative 14  12 - 46 %   Lymphs Abs 1.0  0.7 - 4.0 K/uL   Monocytes Relative 5  3 - 12 %   Monocytes Absolute 0.4  0.1 - 1.0 K/uL   Eosinophils Relative 0  0 - 5 %   Eosinophils Absolute 0.0  0.0 - 0.7 K/uL   Basophils Relative 0  0 - 1 %   Basophils Absolute 0.0  0.0 - 0.1 K/uL  PRO B NATRIURETIC PEPTIDE     Status: Abnormal   Collection Time   10/15/12  1:12 PM      Result Value Range   Pro B Natriuretic peptide (BNP) 7041.0 (*) 0 - 450 pg/mL  BLOOD GAS, ARTERIAL     Status: Abnormal   Collection Time    10/15/12  2:36 PM      Result Value Range   FIO2 1.00     Delivery systems NON-REBREATHER OXYGEN MASK     pH, Arterial 7.252 (*) 7.350 - 7.450   pCO2 arterial 55.3 (*) 35.0 - 45.0 mmHg   pO2, Arterial 82.5  80.0 - 100.0 mmHg   Bicarbonate 23.5  20.0 - 24.0 mEq/L   TCO2 25.2  0 - 100  mmol/L   Acid-base deficit 2.7 (*) 0.0 - 2.0 mmol/L   O2 Saturation 94.0     Patient temperature 98.6     Collection site LEFT RADIAL     Drawn by [id number]     Sample type ARTERIAL DRAW     Allens test (pass/fail) PASS  PASS  MRSA PCR SCREENING     Status: None   Collection Time    10/15/12  5:03 PM      Result Value Range   MRSA by PCR NEGATIVE  NEGATIVE  GLUCOSE, CAPILLARY     Status: None   Collection Time    10/15/12 10:12 PM      Result Value Range   Glucose-Capillary 97  70 - 99 mg/dL  BASIC METABOLIC PANEL     Status: Abnormal   Collection Time    10/16/12  5:00 AM      Result Value Range   Sodium 143  135 - 145 mEq/L   Potassium 3.8  3.5 - 5.1 mEq/L   Chloride 104  96 - 112 mEq/L   CO2 25  19 - 32 mEq/L   Glucose, Bld 117 (*) 70 - 99 mg/dL   BUN 35 (*) 6 - 23 mg/dL   Creatinine, Ser 0.45 (*) 0.50 - 1.35 mg/dL   Calcium 7.9 (*) 8.4 - 10.5 mg/dL   GFR calc non Af Amer 43 (*) >90 mL/min   GFR calc Af Amer 50 (*) >90 mL/min  PROTIME-INR     Status: Abnormal   Collection Time    10/16/12  5:00 AM      Result Value Range   Prothrombin Time 37.7 (*) 11.6 - 15.2 seconds   INR 4.03 (*) 0.00 - 1.49  CBC     Status: Abnormal   Collection Time    10/16/12  5:00 AM      Result Value Range   WBC 13.0 (*) 4.0 - 10.5 K/uL   RBC 4.20 (*) 4.22 - 5.81 MIL/uL   Hemoglobin 13.9  13.0 - 17.0 g/dL   HCT 40.9  81.1 - 91.4 %   MCV 97.4  78.0 - 100.0 fL   MCH 33.1  26.0 - 34.0 pg   MCHC 34.0  30.0 - 36.0 g/dL   RDW 78.2  95.6 - 21.3 %     Platelets 121 (*) 150 - 400 K/uL    Intake/Output Summary (Last 24 hours) at 10/16/12 0918 Last data filed at 10/16/12 0600  Gross per 24 hour  Intake 878.23 ml  Output    850 ml  Net  28.23 ml   CXR:  Improved pulmonary edema. There is residual airspace opacity/ edema  most evident in the left perihilar and lower lung zone. (I reviewed the images.)  10/16/2012  ASSESSMENT AND PLAN:  ACUTE ON CHRONIC SYSTOLIC HF:  Respiratory failure.  CXR better today.  However, urine output is down and Creat is up.  We are holding the ACE inhibitor and the beta blocker with the hypotension.  Wean Levophed as we can.  I will increase the diuretic slightly.  He might need inotropes later if UO does not improve.   ATRIAL FIB WITH RAPID RESPONSE:  Holding warfarin with supratherapeutic INR. Pharmacy is following.  ATRIAL APPENDAGE THROMBUS:  He will need an additional two weeks of anticoagulation prior to DCCV.    CKD:   Creat is up. Follow closely.    Fayrene Fearing [facility] 10/16/2012 9:18 AM

## 2012-10-16 NOTE — Progress Notes (Signed)
PHARMACY FOLLOW UP NOTE   Pharmacy Consult for : Coumadin Indication: atrial fibrillation  Dosing Weight: 104 kg  Labs:  Recent Labs  10/13/12 1232 10/14/12 1621 10/15/12 0846 10/15/12 1310 10/16/12 0500  HGB 13.7  --   --  14.3 13.9  HCT 40.9  --   --  42.2 40.9  PLT 114.0*  --   --  117* 121*  LABPROT 34.5*  --  27.9*  --  37.7*  INR 3.3* 4.1* 2.72*  --  4.03*  CREATININE 1.2  --   --  1.21 1.52*   Lab Results  Component Value Date   INR 4.03* 10/16/2012   INR 2.72* 10/15/2012   INR 4.1* 10/14/2012    Estimated Creatinine Clearance: 50.8 ml/min (by C-G formula based on Cr of 1.52).  Pertinent Medications:  Prescriptions prior to admission  Medication Sig Dispense Refill  . allopurinol (ZYLOPRIM) 300 MG tablet Take 300 mg by mouth daily.        Marland Kitchen amiodarone (PACERONE) 400 MG tablet Take 1 tablet (400 mg total) by mouth daily.  30 tablet  4  . amoxicillin (AMOXIL) 500 MG capsule Take 500 mg by mouth every other day.       . captopril (CAPOTEN) 50 MG tablet Take 50 mg by mouth 3 (three) times daily.        . carvedilol (COREG) 25 MG tablet Take 25 mg by mouth 2 (two) times daily with a meal.      . Coenzyme Q10 (CO Q-10 PO) Take by mouth daily.        . colchicine 0.6 MG tablet Take 0.6 mg by mouth as needed.       . furosemide (LASIX) 40 MG tablet Take 40 mg by mouth daily.        . memantine (NAMENDA) 5 MG tablet Take 5 mg by mouth daily.      . Memantine HCl ER (NAMENDA XR) 28 MG CP24 Take 28 mg by mouth daily.      . Multiple Vitamins-Minerals (MULTIVITAMIN WITH MINERALS) tablet Take 1 tablet by mouth daily.        . Red Yeast Rice Extract (RED YEAST RICE PO) Take 1 tablet by mouth daily.       . rosuvastatin (CRESTOR) 10 MG tablet Take 10 mg by mouth daily.        . traZODone (DESYREL) 50 MG tablet Take 50 mg by mouth at bedtime.        Marland Kitchen warfarin (COUMADIN) 5 MG tablet Take 2.5-5 mg by mouth daily. 5mg  SuMoFrSa; 2.5mg  Th; take none TuThu.       Scheduled:  .  allopurinol  300 mg Oral Daily  . amoxicillin  500 mg Oral Once  . [START ON 10/17/2012] amoxicillin  500 mg Oral Q48H  . antiseptic oral rinse  15 mL Mouth Rinse QID  . atorvastatin  20 mg Oral q1800  . captopril  50 mg Oral TID  . carvedilol  25 mg Oral BID WC  . chlorhexidine  15 mL Mouth Rinse BID  . famotidine (PEPCID) IV  20 mg Intravenous Q12H  . furosemide  40 mg Intravenous Once  . furosemide  60 mg Intravenous Q12H  . levalbuterol  0.63 mg Nebulization Q6H  . multivitamin with minerals  1 tablet Oral Daily  . sodium chloride  10-40 mL Intracatheter Q12H  . sodium chloride  3 mL Intravenous Q12H  . traZODone  50 mg Oral QHS  . Warfarin -  Pharmacist Dosing Inpatient   Does not apply q1800   amiodarone (NEXTERONE PREMIX) 360 mg/200 mL dextrose Last Rate: 30 mg/hr (10/15/12 2253)  fentaNYL infusion INTRAVENOUS Last Rate: 50 mcg/hr (10/15/12 2000)  norepinephrine (LEVOPHED) Adult infusion Last Rate: 10 mcg/min (10/16/12 0636)    Assessment:  77 y.o.m with recent Afib and worsening heart failure. He had 2 weeks of therapeutic anticoagulation PTA. Brought to hospital to undergo transesophageal echo guided cardioversion. A small clot was found in his left atrial appendage and cardioversion was postponed   Patient received Coumadin 5 mg yesterday, PTA.  INR SUPRAtherapeutic 4.03.  No bleeding complications noted , Hgb stable.  Patient remains on Amiodarone drip.   Goal:  INR 2-3   Plan: 1. No Coumadin today. 2. Daily INR's, CBC.  Monitor for bleeding complications.    Tanieka Pownall, Deetta Perla.D 10/16/2012, 9:20 AM

## 2012-10-16 NOTE — Consult Note (Signed)
PHARMACY CONSULT NOTE - INITIAL  Pharmacy Consult for :   Unasyn Indication:  Presumed PNA  Hospital Problems: Active Problems:   Acute respiratory failure with hypoxia   Pulmonary edema   Cardiac asthma   Cardiomyopathy   Poor venous access   Allergies: No Known Allergies  Patient Measurements: Height: 5\' 10"  (177.8 cm) Weight: 230 lb 6.1 oz (104.5 kg) IBW/kg (Calculated) : 73  Vital Signs: BP 106/81  Pulse 96  Temp(Src) 98.2 F (36.8 C) (Oral)  Resp 26  Ht 5\' 10"  (1.778 m)  Wt 230 lb 6.1 oz (104.5 kg)  BMI 33.06 kg/m2  SpO2 99%  Labs:  Recent Labs  10/13/12 1232 10/15/12 1310 10/16/12 0500  WBC 7.4 7.3 13.0*  HGB 13.7 14.3 13.9  PLT 114.0* 117* 121*  CREATININE 1.2 1.21 1.52*   Estimated Creatinine Clearance: 50.8 ml/min (by C-G formula based on Cr of 1.52).   Microbiology: Recent Results (from the past 720 hour(s))  SURGICAL PCR SCREEN     Status: Abnormal   Collection Time    09/20/12  6:36 AM      Result Value Range Status   MRSA, PCR NEGATIVE  NEGATIVE Final   Staphylococcus aureus POSITIVE (*) NEGATIVE Final   Comment:            The Xpert SA Assay (FDA     approved for NASAL specimens     in patients over 39 years of age),     is one component of     a comprehensive surveillance     program.  Test performance has     been validated by [employer] for patients greater     than or equal to 45 year old.     It is not intended     to diagnose infection nor to     guide or monitor treatment.  MRSA PCR SCREENING     Status: None   Collection Time    10/15/12  5:03 PM      Result Value Range Status   MRSA by PCR NEGATIVE  NEGATIVE Final    Medical/Surgical History: Past Medical History  Diagnosis Date  . Left bundle-branch block   . Cardiomyopathy    Past Surgical History  Procedure Laterality Date  . Doppler echocardiography  2008, 2009  . Aortic valve replacement      Medications:  Prescriptions prior to admission   Medication Sig Dispense Refill  . allopurinol (ZYLOPRIM) 300 MG tablet Take 300 mg by mouth daily.        Marland Kitchen amiodarone (PACERONE) 400 MG tablet Take 1 tablet (400 mg total) by mouth daily.  30 tablet  4  . amoxicillin (AMOXIL) 500 MG capsule Take 500 mg by mouth every other day.       . captopril (CAPOTEN) 50 MG tablet Take 50 mg by mouth 3 (three) times daily.        . carvedilol (COREG) 25 MG tablet Take 25 mg by mouth 2 (two) times daily with a meal.      . Coenzyme Q10 (CO Q-10 PO) Take by mouth daily.        . colchicine 0.6 MG tablet Take 0.6 mg by mouth as needed.       . furosemide (LASIX) 40 MG tablet Take 40 mg by mouth daily.        . memantine (NAMENDA) 5 MG tablet Take 5 mg by mouth daily.      Marland Kitchen  Memantine HCl ER (NAMENDA XR) 28 MG CP24 Take 28 mg by mouth daily.      . Multiple Vitamins-Minerals (MULTIVITAMIN WITH MINERALS) tablet Take 1 tablet by mouth daily.        . Red Yeast Rice Extract (RED YEAST RICE PO) Take 1 tablet by mouth daily.       . rosuvastatin (CRESTOR) 10 MG tablet Take 10 mg by mouth daily.        . traZODone (DESYREL) 50 MG tablet Take 50 mg by mouth at bedtime.        Marland Kitchen warfarin (COUMADIN) 5 MG tablet Take 2.5-5 mg by mouth daily. 5mg  SuMoFrSa; 2.5mg  Th; take none TuThu.       Scheduled:  . allopurinol  300 mg Oral Daily  . ampicillin-sulbactam (UNASYN) IVPB 3 g  3 g Intravenous Q6H  . antiseptic oral rinse  15 mL Mouth Rinse QID  . atorvastatin  20 mg Oral q1800  . chlorhexidine  15 mL Mouth Rinse BID  . famotidine (PEPCID) IV  20 mg Intravenous Q12H  . feeding supplement (VITAL AF 1.2 CAL)  1,000 mL Per Tube Q24H  . furosemide  40 mg Intravenous Once  . furosemide  80 mg Intravenous Q12H  . levalbuterol  0.63 mg Nebulization Q6H  . multivitamin with minerals  1 tablet Oral Daily  . sodium chloride  10-40 mL Intracatheter Q12H  . sodium chloride  3 mL Intravenous Q12H  . traZODone  50 mg Oral QHS  . Warfarin - Pharmacist Dosing Inpatient   Does  not apply q1800   Infusions:  . sodium chloride Stopped (10/15/12 1111)  . sodium chloride    . sodium chloride 10 mL/hr at 10/15/12 2000  . amiodarone (NEXTERONE PREMIX) 360 mg/200 mL dextrose 30 mg/hr (10/16/12 1024)  . fentaNYL infusion INTRAVENOUS 50 mcg/hr (10/15/12 2000)  . norepinephrine (LEVOPHED) Adult infusion 10 mcg/min (10/16/12 0636)   Anti-infectives   Start     Dose/Rate Route Frequency Ordered Stop   10/17/12 1000  amoxicillin (AMOXIL) capsule 500 mg  Status:  Discontinued     500 mg Oral Every 48 hours 10/15/12 1233 10/16/12 1143   10/16/12 1215  Ampicillin-Sulbactam (UNASYN) 3 g in sodium chloride 0.9 % 100 mL IVPB     3 g 100 mL/hr over 60 Minutes Intravenous Every 6 hours 10/16/12 1202     10/15/12 1330  amoxicillin (AMOXIL) capsule 500 mg  Status:  Discontinued     500 mg Oral  Once 10/15/12 1229 10/16/12 1143      Assessment:  77 y/o male with NICM, recent onset of atrial fibrillation who was admitted for planned TEE.  Atrial appendage thrombus found and conversion postponed.  Patient developed severe respiratory distress, wheezing, and pulmonary edema requiring transfer to the CCU.  Empiric Unasyn ordered for possible PNA.  Goal of Therapy:  Antibiotics selected for infection/cultures and adjusted for renal function.   Plan:   Begin Unasyn 3 gm IV q 6 hours. Follow up SCr, UOP, cultures, clinical course and adjust as clinically indicated.  Dywane Peruski, Elisha Headland,  Pharm.D.,    10/11/201412:02 PM

## 2012-10-17 DIAGNOSIS — I4891 Unspecified atrial fibrillation: Secondary | ICD-10-CM

## 2012-10-17 LAB — BLOOD GAS, ARTERIAL
Drawn by: 36277
FIO2: 0.4 %
MECHVT: 600 mL
O2 Saturation: 98.2 %
PEEP: 5 cmH2O
RATE: 14 resp/min
pCO2 arterial: 39.4 mmHg (ref 35.0–45.0)
pH, Arterial: 7.486 — ABNORMAL HIGH (ref 7.350–7.450)
pO2, Arterial: 95.4 mmHg (ref 80.0–100.0)

## 2012-10-17 LAB — COMPREHENSIVE METABOLIC PANEL
ALT: 29 U/L (ref 0–53)
AST: 18 U/L (ref 0–37)
Albumin: 2.8 g/dL — ABNORMAL LOW (ref 3.5–5.2)
Alkaline Phosphatase: 46 U/L (ref 39–117)
BUN: 41 mg/dL — ABNORMAL HIGH (ref 6–23)
CO2: 29 mEq/L (ref 19–32)
Chloride: 102 mEq/L (ref 96–112)
GFR calc non Af Amer: 78 mL/min — ABNORMAL LOW (ref >90)
Glucose, Bld: 188 mg/dL — ABNORMAL HIGH (ref 70–99)
Potassium: 3 mEq/L — ABNORMAL LOW (ref 3.5–5.1)
Sodium: 142 mEq/L (ref 135–145)
Total Protein: 5.5 g/dL — ABNORMAL LOW (ref 6.0–8.3)

## 2012-10-17 LAB — GLUCOSE, CAPILLARY
Glucose-Capillary: 111 mg/dL — ABNORMAL HIGH (ref 70–99)
Glucose-Capillary: 134 mg/dL — ABNORMAL HIGH (ref 70–99)
Glucose-Capillary: 163 mg/dL — ABNORMAL HIGH (ref 70–99)
Glucose-Capillary: 164 mg/dL — ABNORMAL HIGH (ref 70–99)

## 2012-10-17 LAB — CBC
Hemoglobin: 12.2 g/dL — ABNORMAL LOW (ref 13.0–17.0)
MCHC: 33.7 g/dL (ref 30.0–36.0)
RBC: 3.74 MIL/uL — ABNORMAL LOW (ref 4.22–5.81)
RDW: 15.3 % (ref 11.5–15.5)

## 2012-10-17 LAB — BASIC METABOLIC PANEL
BUN: 45 mg/dL — ABNORMAL HIGH (ref 6–23)
Calcium: 8.2 mg/dL — ABNORMAL LOW (ref 8.4–10.5)
GFR calc non Af Amer: 66 mL/min — ABNORMAL LOW (ref >90)
Glucose, Bld: 151 mg/dL — ABNORMAL HIGH (ref 70–99)
Sodium: 144 mEq/L (ref 135–145)

## 2012-10-17 LAB — PROTIME-INR
INR: 4.92 — ABNORMAL HIGH (ref 0.00–1.49)
Prothrombin Time: 43.9 seconds — ABNORMAL HIGH (ref 11.6–15.2)

## 2012-10-17 LAB — APTT: aPTT: 56 seconds — ABNORMAL HIGH (ref 24–37)

## 2012-10-17 MED ORDER — POTASSIUM CHLORIDE 10 MEQ/50ML IV SOLN
10.0000 meq | INTRAVENOUS | Status: AC
  Administered 2012-10-17 (×2): 10 meq via INTRAVENOUS
  Filled 2012-10-17 (×2): qty 50

## 2012-10-17 MED ORDER — PNEUMOCOCCAL VAC POLYVALENT 25 MCG/0.5ML IJ INJ
0.5000 mL | INJECTION | INTRAMUSCULAR | Status: DC
  Filled 2012-10-17: qty 0.5

## 2012-10-17 MED ORDER — POTASSIUM CHLORIDE 20 MEQ/15ML (10%) PO LIQD
40.0000 meq | Freq: Once | ORAL | Status: AC
  Administered 2012-10-17: 40 meq
  Filled 2012-10-17: qty 30

## 2012-10-17 MED ORDER — QUETIAPINE FUMARATE 50 MG PO TABS
50.0000 mg | ORAL_TABLET | Freq: Every day | ORAL | Status: DC
  Administered 2012-10-17 – 2012-10-25 (×9): 50 mg via ORAL
  Filled 2012-10-17 (×10): qty 1

## 2012-10-17 NOTE — Consult Note (Signed)
PULMONARY  / CRITICAL CARE MEDICINE  Name: Jon Butler MRN: [id number] DOB: [birth year]    ADMISSION DATE:  10/15/2012 CONSULTATION DATE:  10/10  REFERRING MD :  Sharrell Ku  PRIMARY SERVICE: Cards  CHIEF COMPLAINT:   Acute respiratory failure   BRIEF PATIENT DESCRIPTION:  41 M with NICM (EF 25%), recent onset AF. Admitted 10/10 for planned TEE cardioversion. Atrial appendage thrombus found on TEE and cardioversion was not performed. Developed severe respiratory distress with pulm edema pattern on CXR and wheezing. Transferred emergently to ICU and PCCM consulted.   SIGNIFICANT EVENTS / STUDIES:  10/10 Admitted for elective cardioversion 10/10 TEE: atrial appendage clot. No cardioversion performed 10/10 severe respiratory distress. Transfer to CCU emergently. Inadequately supported on NPPV. Intubated  LINES / TUBES: ETT 10/10 >>  L femoral CVL 10/10 >> will dc once inr improved PICC (ordered 10/10) >>   MICRO: Resp 10/10 >> 10/11 BC>>>  ANTIBIOTICS: Amoxicillin (chronic) 10/10 >> 1-/11 unasyn 10/11>>>  SUBJECTIVE:  Neg balance, no fevers  VITAL SIGNS: Temp:  [97.4 F (36.3 C)-98.5 F (36.9 C)] 98.2 F (36.8 C) (10/12 0830) Pulse Rate:  [67-103] 103 (10/12 1145) Resp:  [12-24] 21 (10/12 1145) BP: (85-120)/(53-90) 120/77 mmHg (10/12 1145) SpO2:  [90 %-100 %] 98 % (10/12 1145) FiO2 (%):  [40 %] 40 % (10/12 1146) Weight:  [104.1 kg (229 lb 8 oz)] 104.1 kg (229 lb 8 oz) (10/12 0442) HEMODYNAMICS: CVP:  [8 mmHg-23 mmHg] 20 mmHg VENTILATOR SETTINGS: Vent Mode:  [-] CPAP;PSV FiO2 (%):  [40 %] 40 % Set Rate:  [14 bmp] 14 bmp Vt Set:  [600 mL] 600 mL PEEP:  [5 cmH20] 5 cmH20 Pressure Support:  [5 cmH20] 5 cmH20 Plateau Pressure:  [0 cmH20-26 cmH20] 19 cmH20 INTAKE / OUTPUT: Intake/Output     10/11 0701 - 10/12 0700 10/12 0701 - 10/13 0700   I.V. (mL/kg) 1091.8 (10.5) 72.8 (0.7)   NG/GT 710 30   IV Piggyback 550 100   Total Intake(mL/kg) 2351.8 (22.6) 202.8  (1.9)   Urine (mL/kg/hr) 2745 (1.1) 350 (0.7)   Total Output 2745 350   Net -393.2 -147.2          PHYSICAL EXAMINATION: General: calm on vent Neuro: Cognition intact, no focal deficits, rass 1 HEENT: ett Cardiovascular: s1 s2 IRT, m Lungs: coarse bilateral improved Abdomen: obese, firm not rigid, diminished BS Ext; no edema   LABS:  CBC Recent Labs     10/15/12  1310  10/16/12  0500  10/17/12  0430  WBC  7.3  13.0*  10.0  HGB  14.3  13.9  12.2*  HCT  42.2  40.9  36.2*  PLT  117*  121*  94*   Coag's Recent Labs     10/15/12  0846  10/16/12  0500  10/17/12  0430  APTT   --    --   56*  INR  2.72*  4.03*  4.92*   BMET Recent Labs     10/16/12  0500  10/16/12  1544  10/17/12  0430  NA  143  142  142  K  3.8  3.7  3.0*  CL  104  104  102  CO2  25  27  29   BUN  35*  36*  41*  CREATININE  1.52*  1.23  0.98  GLUCOSE  117*  160*  188*   Electrolytes Recent Labs     10/16/12  0500  10/16/12  1544  10/17/12  0430  CALCIUM  7.9*  7.9*  7.8*   Sepsis Markers No results found for this basename: LACTICACIDVEN, PROCALCITON, O2SATVEN,  in the last 72 hours ABG Recent Labs     10/15/12  1436  10/16/12  1205  10/17/12  0421  PHART  7.252*  7.424  7.486*  PCO2ART  55.3*  40.2  39.4  PO2ART  82.5  109.0*  95.4   Liver Enzymes Recent Labs     10/17/12  0430  AST  18  ALT  29  ALKPHOS  46  BILITOT  1.0  ALBUMIN  2.8*   Cardiac Enzymes Recent Labs     10/15/12  1312  PROBNP  7041.0*   Glucose Recent Labs     10/15/12  2212  10/16/12  1615  10/16/12  1951  10/17/12  0036  10/17/12  0411  10/17/12  0752  GLUCAP  97  168*  169*  163*  134*  148*    Imaging Dg Chest Port 1 View  10/16/2012   CLINICAL DATA:  Followup pulmonary edema.  EXAM: PORTABLE CHEST - 1 VIEW  COMPARISON:  10/15/2012  FINDINGS: Airspace opacity has improved on the right, mildly improved on the left. The hemidiaphragm outlines are now visible.  No new lung opacities.   Changes from cardiac surgery and the cardiac enlargement are stable.  Endotracheal tube and right PICC are stable in well positioned. New enteric tube passes below the diaphragm into the stomach.  IMPRESSION: Improved pulmonary edema. There is residual airspace opacity/ edema most evident in the left perihilar and lower lung zone.   Electronically Signed   By: Amie Portland M.D.   On: 10/16/2012 07:37   Dg Chest Port 1 View  10/15/2012   CLINICAL DATA:  PICC placement.  EXAM: PORTABLE CHEST - 1 VIEW  COMPARISON:  Earlier today.  FINDINGS: The endotracheal tube remains in satisfactory position. Interval right PICC with its tip in the inferior aspect of the superior vena cava near its junction with the right atrium. Stable left subclavian pacer and AICD leads. Stable enlarged cardiac silhouette and dense left lower lobe opacity. Slightly more confluent opacity in the mid and lower lung zones elsewhere bilaterally. Continued evidence of bilateral pleural fluid. No acute bony abnormality.  IMPRESSION: 1. Right PICC tip near the cavoatrial junction. 2. Mildly progressive pulmonary edema and bilateral pleural fluid. Underlying pneumonia cannot be excluded.   Electronically Signed   By: Gordan Payment M.D.   On: 10/15/2012 21:56   Dg Chest Port 1 View  10/15/2012   CLINICAL DATA:  Endotracheal tube placement.  EXAM: PORTABLE CHEST - 1 VIEW  COMPARISON:  October 15, 2012.  FINDINGS: There has been interval placement of endotracheal tube with distal tip 4 cm above the carinal. Sternotomy wires are noted. Left-sided pacemaker is unchanged bilateral airspace opacities and effusions are again noted and unchanged.  IMPRESSION: No change involving bilateral airspace opacities and effusions. Endotracheal tube in grossly good position.   Electronically Signed   By: Roque Lias M.D.   On: 10/15/2012 17:36   Dg Chest Port 1 View  10/15/2012   CLINICAL DATA:  77 year old male with shortness of breath.  EXAM: PORTABLE CHEST -  1 VIEW  COMPARISON:  04/02/2007  FINDINGS: Cardiomegaly, cardiac surgical changes and left-sided pacemaker/AICD again noted.  Bilateral airspace opacities are identified - compatible with pulmonary edema.  Mild basilar atelectasis is noted.  No large pleural effusions are identified on this study.  There  is no evidence of pneumothorax or acute bony abnormality.  IMPRESSION: Cardiomegaly with moderate pulmonary edema.   Electronically Signed   By: Laveda Abbe M.D.   On: 10/15/2012 15:22   Dg Abd Portable 1v  10/15/2012   CLINICAL DATA:  Status post orogastric tube placement.  EXAM: PORTABLE ABDOMEN - 1 VIEW  COMPARISON:  None.  FINDINGS: Paucity of intestinal gas. Orogastric tube tip in the region of the gastroesophageal junction. Intracardiac pacer and AICD leads are noted. Lumbar lower thoracic spine degenerative changes and mild to moderate scoliosis.  IMPRESSION: Orogastric tube tip in the region of the gastroesophageal junction.   Electronically Signed   By: Gordan Payment M.D.   On: 10/15/2012 17:44     CXR: edema pattern improved, left infiltrate residual remains  ASSESSMENT / PLAN:  PULMONARY A: Acute respiratory failure Pulmonary edema Bronchospasm - likely cardiac asthma P:   -ABG reviewed, may need Tv reduction -this am weaning cpap 5 ps5, goal 2 hrs, assess rsbi -see renal -see ID -secretions may be an issue, evaluate cough  CARDIOVASCULAR A: Severe cardiomyopathy S/P mechanical aortic valve replacement, remote AFRVR L atrial appendage clot Poor venous access - difficult L femoral CVL placed  P:  Mgmt per Cards Cont anticoagulation cvp was 17 , lasix was given amio per cards Levophed improved, to map goal 60  RENAL A:  Mild ARI (baseline Cr 1.1)- improved, slite rise crt, hypoK P:   K supp kvo Lasix per cards, improved with this approach Chem in am   GASTROINTESTINAL A:  Tf tolerated P:   SUP: IV famotidine Address nutrition 10/11, started, hold for  weaning  HEMATOLOGIC A:  Chronic warfarin Mild thrombocytopenia - predates admission Bloody secretions resolving P:  DVT px: full dose warfarin Monitor CBC intermittently Warfarin per pharm, INr noted  INFECTIOUS A:  No definite infections B pulm infiltrates likely edema - r/o PNA P:   Unasyn, follow sputum Dc fem line once inr correcting, hope in am   ENDOCRINE A:   Mild stress induced hyperglycemia - improved, no prior DM P:   CBGs q 8 hrs SSI if glu > 180  NEUROLOGIC A:  Agitation without delirium- resolving P:   Avoid versed if able fent with wua, hold 50 mics drip  TODAY'S SUMMARY: lasix per cards, renals and levo better with lasix Weaning well, consider extubation  I have personally obtained a history, examined the patient, evaluated laboratory and imaging results, formulated the assessment and plan and placed orders. CRITICAL CARE: The patient is critically ill with multiple organ systems failure and requires high complexity decision making for assessment and support, frequent evaluation and titration of therapies, application of advanced monitoring technologies and extensive interpretation of multiple databases. Critical Care Time devoted to patient care services described in this note is 30 minutes.   Mcarthur Rossetti. Tyson Alias, MD, FACP Pgr: [phone] [facility]  Pulmonary and Critical Care Medicine [facility] Pager: [phone]  10/17/2012, 11:53 AM

## 2012-10-17 NOTE — Progress Notes (Signed)
Came in room to find Patient standing at the end of the bed trying to go to the bathroom. He had pulled all of his leads and wires off, and had also removed his IV drips from infusing. Patient refused to get back in bed until he used the restroom and he was very agitated. Bedside commode brought to bedside and that satisfied him somewhat. He remains very agitated and restless. Instructed him that he was not allowed to get up due to his current condition. He verbalized understanding but still insisted that he was going to use the bathroom. Once patient finished he was assisted back to bed and noted to be very unsteady. Instructed him that he is not able to get up anymore until the physician approves. Patient has left groin central line and elevated INR level at this time. Patient also has Right upper arm PICC that he picks at. Will continue to assess and monitor.

## 2012-10-17 NOTE — Progress Notes (Addendum)
Fentanyl drip wasted in sink 230 ml with second nurse witness Engineer, mining.   Witnessed waste above.  Eliane Decree, RN

## 2012-10-17 NOTE — Progress Notes (Signed)
eLink Physician-Brief Progress Note Patient Name: Jon Butler DOB: [birth year] MRN: [id number]  Date of Service  10/17/2012   HPI/Events of Note   Speech eval pending, RN concerned for high aspiration risk  eICU Interventions  D/c protein supplement for now      Simon Llamas 10/17/2012, 4:27 PM

## 2012-10-17 NOTE — Progress Notes (Signed)
Give 12:30 pm ( q 12 hr scheduled dose) of lasix  80 mg IV now

## 2012-10-17 NOTE — Progress Notes (Signed)
eLink Physician-Brief Progress Note Patient Name: Jon Butler DOB: 1935-02-16 MRN: [id number]  Date of Service  10/17/2012   HPI/Events of Note     eICU Interventions  Hypokalemia -repleted    Intervention Category Intermediate Interventions: Electrolyte abnormality - evaluation and management  ALVA,RAKESH V. 10/17/2012, 5:49 AM

## 2012-10-17 NOTE — Evaluation (Signed)
Clinical/Bedside Swallow Evaluation Patient Details  Name: Jon Butler MRN: [id number] Date of Birth: Nov 03, 1935  Today's Date: 10/17/2012 Time:  -     Past Medical History:  Past Medical History  Diagnosis Date  . Left bundle-branch block   . Cardiomyopathy    Past Surgical History:  Past Surgical History  Procedure Laterality Date  . Doppler echocardiography  2008, 2009  . Aortic valve replacement     HPI:  Jon Butler returns today for followup. He is a 77 year old man with chronic systolic heart failure, aortic valve replacement, and recent diagnosis of atrial fibrillation with a rapid ventricular response. He is a history of medical noncompliance. We changed out his ICD several weeks ago. At that time, my hope was that we could cardiovert the patient. Unfortunately his INR was subtherapeutic. Since then his INR has been at times somewhat therapeutic but most recently therapeutic. He has had 2 weeks of therapeutic INRs. My concern is that he will drop below therapeutic. His heart failure symptoms have worsened going from class 2a now class IIIB. He has developed abdominal distention with minimal peripheral edema. He gets short of breath walking across the room. He has become very sedentary. Recently his INR was 4.1. He held his Jon Butler for one day. [name] with NICM (EF 25%), recent onset AF. Admitted 10/10 for planned TEE cardioversion. Atrial appendage thrombus found on TEE and cardioversion was not performed. Developed severe respiratory distress with pulm edema pattern on CXR and wheezing. Transferred emergently to ICU and PCCM consulted.  Intubated 10/10 to 10/12.  BSE ordered due to noted wet vocal quality with thin liquid s/p extubation.  Spouse present during evaluation and reports patient with baseline esophageal based dysphagia.     Assessment / Plan / Recommendation Clinical Impression  BSE completed indicates baseline suspected esophageal based dysphagia with temporary  pharyngeal dysphagia following extubation.  Vocal quality clear but hoarse.  Baseline cough noted prior to PO trials.  Swallow timely with functional laryngeal elevation.  Reports of globus sensation with PO trials of regular solids.  Strategy of following bites with small sips effective in improving bolus transit per patient report.  Recommend to proceed with dysphagia 2 ( finely chopped) diet consistency and thin liquids with strict aspiration and reflux precautions as aspiration risk remains.  Cannot rule out silent aspiration with subjective evaluation.  Recommend continued ST in Acute Care Setting for diet tolerance and possible advancement to ensure safety.    At completion of evaluation patient expectorated blood tinged secretions.  Will defer to MD to initiate PO diet.  ST to f/u on 10/18/12.      Aspiration Risk  Moderate    Diet Recommendation Dysphagia 2 (Fine chop);Thin liquid   Liquid Administration via: Cup;No straw Medication Administration: Whole meds with puree Supervision: Patient able to self feed;Full supervision/cueing for compensatory strategies Compensations: Slow rate;Small sips/bites;Multiple dry swallows after each bite/sip;Follow solids with liquid;Clear throat intermittently Postural Changes and/or Swallow Maneuvers: Seated upright 90 degrees;Upright 30-60 min after meal    Other  Recommendations Oral Care Recommendations: Oral care Q4 per protocol      Frequency and Duration min 2x/week  2 weeks         Swallow Study Prior Functional Status   Lives at home with spouse.  History of esophageal based dysphagia per spouse.    General Date of Onset: 10/15/12 HPI: Jon Butler returns today for followup. He is a 77 year old man with chronic systolic heart failure, aortic valve  replacement, and recent diagnosis of atrial fibrillation with a rapid ventricular response. He is a history of medical noncompliance. We changed out his ICD several weeks ago. At that time, my  hope was that we could cardiovert the patient. Unfortunately his INR was subtherapeutic. Since then his INR has been at times somewhat therapeutic but most recently therapeutic. He has had 2 weeks of therapeutic INRs. My concern is that he will drop below therapeutic. His heart failure symptoms have worsened going from class 2a now class IIIB. He has developed abdominal distention with minimal peripheral edema. He gets short of breath walking across the room. He has become very sedentary. Recently his INR was 4.1. He held his Jon Butler for one day. Type of Study: Bedside swallow evaluation Diet Prior to this Study: NPO Temperature Spikes Noted: No Respiratory Status: Room air History of Recent Intubation: Yes Length of Intubations (days): 2 days Date extubated: 10/17/12 Behavior/Cognition: Alert;Cooperative;Confused;Requires cueing Oral Cavity - Dentition: Adequate natural dentition Self-Feeding Abilities: Able to feed self Patient Positioning: Upright in bed Baseline Vocal Quality: Clear;Hoarse Volitional Cough: Strong Volitional Swallow: Able to elicit    Oral/Motor/Sensory Function Overall Oral Motor/Sensory Function: Appears within functional limits for tasks assessed   Ice Chips Ice chips: Within functional limits   Thin Liquid Thin Liquid: Within functional limits Presentation: Cup;Spoon    Nectar Thick Nectar Thick Liquid: Not tested   Honey Thick Honey Thick Liquid: Not tested   Puree Puree: Within functional limits   Solid   GO    Solid: Impaired Pharyngeal Phase Impairments: Throat Clearing - Delayed;Cough - Delayed      Moreen Fowler MS, CCC-SLP [phone] [facility] 10/17/2012,5:27 PM

## 2012-10-17 NOTE — Progress Notes (Signed)
PHARMACY FOLLOW UP NOTE   Pharmacy Consult for : Coumadin Indication: atrial fibrillation  Dosing Weight: 104 kg  Labs:  Recent Labs  10/14/12 1621 10/15/12 0846 10/15/12 1310 10/16/12 0500 10/16/12 1544 10/17/12 0430  HGB  --   --  14.3 13.9  --  12.2*  HCT  --   --  42.2 40.9  --  36.2*  PLT  --   --  117* 121*  --  94*  APTT  --   --   --   --   --  56*  LABPROT  --  27.9*  --  37.7*  --  43.9*  INR 4.1* 2.72*  --  4.03*  --  4.92*  CREATININE  --   --  1.21 1.52* 1.23 0.98   Lab Results  Component Value Date   INR 4.92* 10/17/2012   INR 4.03* 10/16/2012   INR 2.72* 10/15/2012    Estimated Creatinine Clearance: 78.7 ml/min (by C-G formula based on Cr of 0.98).  Pertinent Medications:  Prescriptions prior to admission  Medication Sig Dispense Refill  . allopurinol (ZYLOPRIM) 300 MG tablet Take 300 mg by mouth daily.        Marland Kitchen amiodarone (PACERONE) 400 MG tablet Take 1 tablet (400 mg total) by mouth daily.  30 tablet  4  . amoxicillin (AMOXIL) 500 MG capsule Take 500 mg by mouth every other day.       . captopril (CAPOTEN) 50 MG tablet Take 50 mg by mouth 3 (three) times daily.        . carvedilol (COREG) 25 MG tablet Take 25 mg by mouth 2 (two) times daily with a meal.      . Coenzyme Q10 (CO Q-10 PO) Take by mouth daily.        . colchicine 0.6 MG tablet Take 0.6 mg by mouth as needed.       . furosemide (LASIX) 40 MG tablet Take 40 mg by mouth daily.        . memantine (NAMENDA) 5 MG tablet Take 5 mg by mouth daily.      . Memantine HCl ER (NAMENDA XR) 28 MG CP24 Take 28 mg by mouth daily.      . Multiple Vitamins-Minerals (MULTIVITAMIN WITH MINERALS) tablet Take 1 tablet by mouth daily.        . Red Yeast Rice Extract (RED YEAST RICE PO) Take 1 tablet by mouth daily.       . rosuvastatin (CRESTOR) 10 MG tablet Take 10 mg by mouth daily.        . traZODone (DESYREL) 50 MG tablet Take 50 mg by mouth at bedtime.        Marland Kitchen warfarin (COUMADIN) 5 MG tablet Take 2.5-5  mg by mouth daily. 5mg  SuMoFrSa; 2.5mg  Th; take none TuThu.       Scheduled:  . allopurinol  300 mg Oral Daily  . ampicillin-sulbactam (UNASYN) IVPB 3 g  3 g Intravenous Q6H  . antiseptic oral rinse  15 mL Mouth Rinse QID  . atorvastatin  20 mg Oral q1800  . chlorhexidine  15 mL Mouth Rinse BID  . famotidine (PEPCID) IV  20 mg Intravenous Q12H  . feeding supplement (PRO-STAT SUGAR FREE 64)  60 mL Per Tube TID  . furosemide  40 mg Intravenous Once  . furosemide  80 mg Intravenous Q12H  . insulin aspart  1-3 Units Subcutaneous Q4H  . levalbuterol  0.63 mg Nebulization Q6H  . multivitamin with  minerals  1 tablet Oral Daily  . [START ON 10/18/2012] pneumococcal 23 valent vaccine  0.5 mL Intramuscular Tomorrow-1000  . potassium chloride  10 mEq Intravenous Q1 Hr x 2  . sodium chloride  10-40 mL Intracatheter Q12H  . sodium chloride  3 mL Intravenous Q12H  . traZODone  50 mg Oral QHS  . Warfarin - Pharmacist Dosing Inpatient   Does not apply q1800   Infusions:  . sodium chloride Stopped (10/15/12 1111)  . sodium chloride    . sodium chloride 10 mL/hr at 10/15/12 2000  . amiodarone (NEXTERONE PREMIX) 360 mg/200 mL dextrose 30 mg/hr (10/16/12 2138)  . feeding supplement (VITAL HIGH PROTEIN) 1,000 mL (10/16/12 1534)  . fentaNYL infusion INTRAVENOUS 50 mcg/hr (10/15/12 2000)  . norepinephrine (LEVOPHED) Adult infusion 5 mcg/min (10/17/12 0615)   Anti-infectives   Start     Dose/Rate Route Frequency Ordered Stop   10/17/12 1000  amoxicillin (AMOXIL) capsule 500 mg  Status:  Discontinued     500 mg Oral Every 48 hours 10/15/12 1233 10/16/12 1143   10/16/12 1215  Ampicillin-Sulbactam (UNASYN) 3 g in sodium chloride 0.9 % 100 mL IVPB     3 g 100 mL/hr over 60 Minutes Intravenous Every 6 hours 10/16/12 1202     10/15/12 1330  amoxicillin (AMOXIL) capsule 500 mg  Status:  Discontinued     500 mg Oral  Once 10/15/12 1229 10/16/12 1143      Assessment:  77 y.o.m with recent Afib and  worsening heart failure who is on chronic Coumadin for atrial fibrillation.  Patient is currently on an Amiodarone infusion [he is on Amiodarone at home].  He is also on Unasyn for presumed PNA.  INR continuing to rise despite no additional Coumadin yesterday when admitted.  INR 4.92.  No bleeding complications noted   Goal:  INR 2-3   Plan: 1. Continuing Unasyn for PNA.   2. NO Coumadin today. 3. Daily INR's, CBC.  Monitor for bleeding complications.    Troi Florendo, Deetta Perla.D 10/17/2012, 8:20 AM

## 2012-10-17 NOTE — Procedures (Signed)
Extubation Procedure Note  Patient Details:   Name: Jon Butler DOB: January 11, 1935 MRN: [id number]   Airway Documentation:     Evaluation  O2 sats: stable throughout Complications: No apparent complications Patient did tolerate procedure well. Bilateral Breath Sounds: Rhonchi;Diminished Suctioning: Airway Yes  [name] 10/17/2012, 12:27 PM

## 2012-10-17 NOTE — Progress Notes (Signed)
Spoke with Dr. Tyson Alias regarding patient status since extubation. Patient wanted to get up and go to bathroom and I explained that was not possible. Patient has an elevated INR level along with a central line in his left groin. Dr. Tyson Alias agreed that patient was not stable to get up at this time due to those factors and patients current fragile state. Patient also wanted something to drink and Dr. Tyson Alias stated that I could assess patients swallowing to determine if he could have anything. Will continue to assess and monitor patient.

## 2012-10-17 NOTE — Progress Notes (Addendum)
SUBJECTIVE:  He is intubated but awake and alert.  Denies any pain.  SBT and pressure support tolerated yesterday.     PHYSICAL EXAM Filed Vitals:   10/17/12 0442 10/17/12 0500 10/17/12 0600 10/17/12 0700  BP:   112/69 107/69  Pulse:  92 72 90  Temp:      TempSrc:      Resp:  17 16 15   Height:      Weight: 229 lb 8 oz (104.1 kg)     SpO2:  99% 97% 100%   General:  No acute distress Lungs:  Decreased breath sounds with crackles Heart:  RRR, mechanical S2 Abdomen:  Positive bowel sounds, no rebound no guarding Extremities:  No edema Neuro:  Awake and appropriate,  nonfocal  LABS:  Results for orders placed during the hospital encounter of 10/15/12 (from the past 24 hour(s))  POCT I-STAT 3, BLOOD GAS (G3+)     Status: Abnormal   Collection Time    10/16/12 12:05 PM      Result Value Range   pH, Arterial 7.424  7.350 - 7.450   pCO2 arterial 40.2  35.0 - 45.0 mmHg   pO2, Arterial 109.0 (*) 80.0 - 100.0 mmHg   Bicarbonate 26.3 (*) 20.0 - 24.0 mEq/L   TCO2 28  0 - 100 mmol/L   O2 Saturation 98.0     Acid-Base Excess 2.0  0.0 - 2.0 mmol/L   Patient temperature 98.0 F     Collection site RADIAL, ALLEN'S TEST ACCEPTABLE     Drawn by RT     Sample type ARTERIAL    CULTURE, RESPIRATORY (NON-EXPECTORATED)     Status: None   Collection Time    10/16/12  3:17 PM      Result Value Range   Specimen Description TRACHEAL ASPIRATE     Special Requests NONE     Gram Stain       Value: MODERATE WBC PRESENT, PREDOMINANTLY PMN     FEW SQUAMOUS EPITHELIAL CELLS PRESENT     FEW GRAM NEGATIVE COCCI     FEW GRAM POSITIVE COCCI IN PAIRS     Performed at [facility]   Culture PENDING     Report Status PENDING    BASIC METABOLIC PANEL     Status: Abnormal   Collection Time    10/16/12  3:44 PM      Result Value Range   Sodium 142  135 - 145 mEq/L   Potassium 3.7  3.5 - 5.1 mEq/L   Chloride 104  96 - 112 mEq/L   CO2 27  19 - 32 mEq/L   Glucose, Bld 160 (*) 70 - 99 mg/dL     BUN 36 (*) 6 - 23 mg/dL   Creatinine, Ser 1.61  0.50 - 1.35 mg/dL   Calcium 7.9 (*) 8.4 - 10.5 mg/dL   GFR calc non Af Amer 56 (*) >90 mL/min   GFR calc Af Amer 65 (*) >90 mL/min  GLUCOSE, CAPILLARY     Status: Abnormal   Collection Time    10/16/12  4:15 PM      Result Value Range   Glucose-Capillary 168 (*) 70 - 99 mg/dL  GLUCOSE, CAPILLARY     Status: Abnormal   Collection Time    10/16/12  7:51 PM      Result Value Range   Glucose-Capillary 169 (*) 70 - 99 mg/dL  GLUCOSE, CAPILLARY     Status: Abnormal   Collection Time  10/17/12 12:36 AM      Result Value Range   Glucose-Capillary 163 (*) 70 - 99 mg/dL  GLUCOSE, CAPILLARY     Status: Abnormal   Collection Time    10/17/12  4:11 AM      Result Value Range   Glucose-Capillary 134 (*) 70 - 99 mg/dL  BLOOD GAS, ARTERIAL     Status: Abnormal   Collection Time    10/17/12  4:21 AM      Result Value Range   FIO2 0.40     Delivery systems VENTILATOR     Mode PRESSURE REGULATED VOLUME CONTROL     VT 600     Rate 14     Peep/cpap 5.0     pH, Arterial 7.486 (*) 7.350 - 7.450   pCO2 arterial 39.4  35.0 - 45.0 mmHg   pO2, Arterial 95.4  80.0 - 100.0 mmHg   Bicarbonate 29.5 (*) 20.0 - 24.0 mEq/L   TCO2 30.7  0 - 100 mmol/L   Acid-Base Excess 5.9 (*) 0.0 - 2.0 mmol/L   O2 Saturation 98.2     Patient temperature 98.6     Collection site RIGHT RADIAL     Drawn by [phone]     Sample type ARTERIAL DRAW     Allens test (pass/fail) PASS  PASS  PROTIME-INR     Status: Abnormal   Collection Time    10/17/12  4:30 AM      Result Value Range   Prothrombin Time 43.9 (*) 11.6 - 15.2 seconds   INR 4.92 (*) 0.00 - 1.49  CBC     Status: Abnormal   Collection Time    10/17/12  4:30 AM      Result Value Range   WBC 10.0  4.0 - 10.5 K/uL   RBC 3.74 (*) 4.22 - 5.81 MIL/uL   Hemoglobin 12.2 (*) 13.0 - 17.0 g/dL   HCT 95.6 (*) 21.3 - 08.6 %   MCV 96.8  78.0 - 100.0 fL   MCH 32.6  26.0 - 34.0 pg   MCHC 33.7  30.0 - 36.0 g/dL   RDW  57.8  46.9 - 62.9 %   Platelets 94 (*) 150 - 400 K/uL  COMPREHENSIVE METABOLIC PANEL     Status: Abnormal   Collection Time    10/17/12  4:30 AM      Result Value Range   Sodium 142  135 - 145 mEq/L   Potassium 3.0 (*) 3.5 - 5.1 mEq/L   Chloride 102  96 - 112 mEq/L   CO2 29  19 - 32 mEq/L   Glucose, Bld 188 (*) 70 - 99 mg/dL   BUN 41 (*) 6 - 23 mg/dL   Creatinine, Ser 5.28  0.50 - 1.35 mg/dL   Calcium 7.8 (*) 8.4 - 10.5 mg/dL   Total Protein 5.5 (*) 6.0 - 8.3 g/dL   Albumin 2.8 (*) 3.5 - 5.2 g/dL   AST 18  0 - 37 U/L   ALT 29  0 - 53 U/L   Alkaline Phosphatase 46  39 - 117 U/L   Total Bilirubin 1.0  0.3 - 1.2 mg/dL   GFR calc non Af Amer 78 (*) >90 mL/min   GFR calc Af Amer >90  >90 mL/min  APTT     Status: Abnormal   Collection Time    10/17/12  4:30 AM      Result Value Range   aPTT 56 (*) 24 - 37 seconds  Intake/Output Summary (Last 24 hours) at 10/17/12 0813 Last data filed at 10/17/12 0800  Gross per 24 hour  Intake 2290.73 ml  Output   3095 ml  Net -804.27 ml   CXR:  Improved pulmonary edema. There is residual airspace opacity/ edema  most evident in the left perihilar and lower lung zone. (I reviewed the images.)  10/17/2012  ASSESSMENT AND PLAN:  ACUTE ON CHRONIC SYSTOLIC HF:  Respiratory failure.    We are holding the ACE inhibitor and the beta blocker with the hypotension.  Weaning Levophed. Reasonable UO yesterday.  CVP is up to 20.  However, he has not had the AM Lasix.  Probably able to extubate today.   ATRIAL FIB WITH RAPID RESPONSE:  On amiodarone GTT.  Continue today.  Likely stop this in the AM.   Holding warfarin with supratherapeutic INR up again today. Pharmacy is following.  ATRIAL APPENDAGE THROMBUS:  He will need an additional two weeks of anticoagulation prior to DCCV.    CKD:   Creat is improved today.  Good urine output but   INFECTIOUS:  He is on Unasyn per CCM but no clear source of infection.  Plan per CCM   [facility] 10/17/2012 8:13 AM

## 2012-10-17 NOTE — Progress Notes (Signed)
Pt found standing at bedside , nasal cannula off the patient was re-oriented to equipment and situation . Pt then sat back down on bed . Pt calm and cooperative . Watching TV .

## 2012-10-17 NOTE — Progress Notes (Signed)
eLink Physician-Brief Progress Note Patient Name: KAID SEEBERGER DOB: 03-27-1935 MRN: [id number]  Date of Service  10/17/2012   HPI/Events of Note   Confusion, pulling at tubes, lines; easily redirectable  eICU Interventions   Seroquel 50mg  qHS      MCQUAID, DOUGLAS 10/17/2012, 4:57 PM

## 2012-10-18 ENCOUNTER — Inpatient Hospital Stay (HOSPITAL_COMMUNITY): Payer: Medicare Other

## 2012-10-18 ENCOUNTER — Encounter (HOSPITAL_COMMUNITY): Payer: Self-pay | Admitting: Internal Medicine

## 2012-10-18 LAB — CBC WITH DIFFERENTIAL/PLATELET
Basophils Absolute: 0 10*3/uL (ref 0.0–0.1)
Basophils Relative: 0 % (ref 0–1)
Eosinophils Absolute: 0.1 10*3/uL (ref 0.0–0.7)
Eosinophils Relative: 1 % (ref 0–5)
HCT: 35.3 % — ABNORMAL LOW (ref 39.0–52.0)
MCH: 32.4 pg (ref 26.0–34.0)
MCHC: 32.9 g/dL (ref 30.0–36.0)
MCV: 98.6 fL (ref 78.0–100.0)
Monocytes Absolute: 0.8 10*3/uL (ref 0.1–1.0)
RBC: 3.58 MIL/uL — ABNORMAL LOW (ref 4.22–5.81)
RDW: 15.4 % (ref 11.5–15.5)

## 2012-10-18 LAB — PROTIME-INR: INR: 3.43 — ABNORMAL HIGH (ref 0.00–1.49)

## 2012-10-18 LAB — BASIC METABOLIC PANEL
CO2: 30 mEq/L (ref 19–32)
Calcium: 8 mg/dL — ABNORMAL LOW (ref 8.4–10.5)
GFR calc non Af Amer: 78 mL/min — ABNORMAL LOW (ref >90)
Glucose, Bld: 114 mg/dL — ABNORMAL HIGH (ref 70–99)
Sodium: 145 mEq/L (ref 135–145)

## 2012-10-18 LAB — GLUCOSE, CAPILLARY
Glucose-Capillary: 102 mg/dL — ABNORMAL HIGH (ref 70–99)
Glucose-Capillary: 124 mg/dL — ABNORMAL HIGH (ref 70–99)
Glucose-Capillary: 134 mg/dL — ABNORMAL HIGH (ref 70–99)

## 2012-10-18 LAB — CULTURE, RESPIRATORY

## 2012-10-18 LAB — CULTURE, RESPIRATORY W GRAM STAIN

## 2012-10-18 MED ORDER — POTASSIUM CHLORIDE 10 MEQ/50ML IV SOLN
INTRAVENOUS | Status: AC
  Administered 2012-10-18: 10 meq
  Filled 2012-10-18: qty 50

## 2012-10-18 MED ORDER — INFLUENZA VAC SPLIT QUAD 0.5 ML IM SUSP
0.5000 mL | INTRAMUSCULAR | Status: AC
  Administered 2012-10-19: 0.5 mL via INTRAMUSCULAR
  Filled 2012-10-18: qty 0.5

## 2012-10-18 MED ORDER — POTASSIUM CHLORIDE 10 MEQ/50ML IV SOLN
10.0000 meq | INTRAVENOUS | Status: AC
  Administered 2012-10-18 (×3): 10 meq via INTRAVENOUS
  Filled 2012-10-18 (×3): qty 50

## 2012-10-18 MED ORDER — FUROSEMIDE 10 MG/ML IJ SOLN
80.0000 mg | Freq: Three times a day (TID) | INTRAMUSCULAR | Status: DC
  Administered 2012-10-18 – 2012-10-21 (×9): 80 mg via INTRAVENOUS
  Filled 2012-10-18 (×11): qty 8

## 2012-10-18 MED ORDER — POTASSIUM CHLORIDE 10 MEQ/100ML IV SOLN
10.0000 meq | INTRAVENOUS | Status: DC
  Filled 2012-10-18: qty 100

## 2012-10-18 MED ORDER — WARFARIN SODIUM 2 MG PO TABS
2.0000 mg | ORAL_TABLET | Freq: Every day | ORAL | Status: DC
  Administered 2012-10-18: 2 mg via ORAL
  Filled 2012-10-18 (×2): qty 1

## 2012-10-18 NOTE — Progress Notes (Addendum)
Patient ID: Jon Butler, male   DOB: 02-07-1935, 77 y.o.   MRN: [id number] Subjective:  Feeling better with less dyspnea  Objective:  Vital Signs in the last 24 hours: Temp:  [98.2 F (36.8 C)-99 F (37.2 C)] 98.2 F (36.8 C) (10/13 0800) Pulse Rate:  [39-132] 100 (10/13 0900) Resp:  [14-24] 23 (10/13 0900) BP: (92-146)/(55-93) 113/93 mmHg (10/13 0900) SpO2:  [90 %-100 %] 96 % (10/13 0900) FiO2 (%):  [40 %] 40 % (10/12 1200) Weight:  [232 lb 5.8 oz (105.4 kg)] 232 lb 5.8 oz (105.4 kg) (10/13 0432)  Intake/Output from previous day: 10/12 0701 - 10/13 0700 In: 1761.6 [P.O.:420; I.V.:642.6; NG/GT:149; IV Piggyback:550] Out: 2675 [Urine:2675] Intake/Output from this shift: Total I/O In: 306.8 [P.O.:120; I.V.:136.8; IV Piggyback:50] Out: 120 [Urine:120]  Physical Exam: stable appearing NAD HEENT: Unremarkable Neck:  8 cm JVD, no thyromegally Back:  No CVA tenderness Lungs:  Scattered rales throughout, no wheezes, no rhonchi, no increased work of breathing. HEART:  Regular rate rhythm, no murmurs, no rubs, no clicks Abd:  Soft, positive bowel sounds, no organomegally, no rebound, no guarding Ext:  2 plus pulses, no edema, no cyanosis, no clubbing Skin:  No rashes no nodules Neuro:  CN II through XII intact, motor grossly intact  Lab Results:  Recent Labs  10/16/12 0500 10/17/12 0430  WBC 13.0* 10.0  HGB 13.9 12.2*  PLT 121* 94*    Recent Labs  10/17/12 1619 10/18/12 0500  NA 144 145  K 3.8 3.4*  CL 104 104  CO2 30 30  GLUCOSE 151* 114*  BUN 45* 41*  CREATININE 1.07 0.99   No results found for this basename: TROPONINI, CK, MB,  in the last 72 hours Hepatic Function Panel  Recent Labs  10/17/12 0430  PROT 5.5*  ALBUMIN 2.8*  AST 18  ALT 29  ALKPHOS 46  BILITOT 1.0   No results found for this basename: CHOL,  in the last 72 hours No results found for this basename: PROTIME,  in the last 72 hours  Imaging: Dg Chest Port 1 View  10/18/2012    *RADIOLOGY REPORT*  Clinical Data: Assess for infiltrate.  PORTABLE CHEST - 1 VIEW  Comparison: Chest radiograph October 16, 2012.  Findings: The cardiac silhouette appears moderately enlarged unchanged, status post median sternotomy. Left cardiac defibrillator insitu.  Increasing central pulmonary vasculature congestion with worsening right, similar left perihilar and lower lobe air space opacities.  Small left pleural effusion.  No pneumothorax.  Right PICC in place, distal tip projecting cavoatrial junction. Multiple EKG lines overlay the patient and could obscure underlying subtle pathology. Interval extubation and removal of nasogastric tube.  IMPRESSION: Stable cardiomegaly with apparent worsening pulmonary edema most apparent on the right, with small pleural effusions.  Interval extubation removal of nasogastric tube, no apparent change in position of right PICC.   Original Report Authenticated By: Awilda Metro    Cardiac Studies: Telemetry- atrial fibrillation with intermittent ventricular pacing Assessment/Plan:  1. acute on chronic systolic heart failure 2. ventilatory dependent respiratory failure secondary to #1 3. atrial fibrillation with a rapid ventricular response, complicating #1 4. systemic anticoagulation with an elevated PT/INR 5. left atrial appendage thrombus 6. hypokalemia  Recommendation: The patient is clinically improved although his x-ray is not particularly encouraging. We will continue IV Lasix, his heart failure medications, and rate control. He will not be able to be cardioverted unless he has had 4 weeks of therapeutic INRs. He will remain on amiodarone  for rate control. While his heart failure is much better compensated, he still has more fluid which will need to be worked on. We hope to remove left groin central venous line later today, and we will replace his potassium.  Lewayne Bunting, M.D. 10/18/2012, 10:34 AM

## 2012-10-18 NOTE — Progress Notes (Addendum)
PHARMACY FOLLOW UP NOTE   Pharmacy Consult for : Coumadin Indication: atrial fibrillation  Dosing Weight: 104 kg  Labs:  Recent Labs  10/15/12 1310 10/16/12 0500 10/16/12 1544 10/17/12 0430 10/17/12 1619 10/18/12 0500  HGB 14.3 13.9  --  12.2*  --   --   HCT 42.2 40.9  --  36.2*  --   --   PLT 117* 121*  --  94*  --   --   APTT  --   --   --  56*  --   --   LABPROT  --  37.7*  --  43.9*  --  33.3*  INR  --  4.03*  --  4.92*  --  3.43*  CREATININE 1.21 1.52* 1.23 0.98 1.07 0.99   Lab Results  Component Value Date   INR 3.43* 10/18/2012   INR 4.92* 10/17/2012   INR 4.03* 10/16/2012   Estimated Creatinine Clearance: 78.4 ml/min (by C-G formula based on Cr of 0.99).  Pertinent Medications:  Prescriptions prior to admission  Medication Sig Dispense Refill  . allopurinol (ZYLOPRIM) 300 MG tablet Take 300 mg by mouth daily.        Marland Kitchen amiodarone (PACERONE) 400 MG tablet Take 1 tablet (400 mg total) by mouth daily.  30 tablet  4  . amoxicillin (AMOXIL) 500 MG capsule Take 500 mg by mouth every other day.       . captopril (CAPOTEN) 50 MG tablet Take 50 mg by mouth 3 (three) times daily.        . carvedilol (COREG) 25 MG tablet Take 25 mg by mouth 2 (two) times daily with a meal.      . Coenzyme Q10 (CO Q-10 PO) Take by mouth daily.        . colchicine 0.6 MG tablet Take 0.6 mg by mouth as needed.       . furosemide (LASIX) 40 MG tablet Take 40 mg by mouth daily.        . memantine (NAMENDA) 5 MG tablet Take 5 mg by mouth daily.      . Memantine HCl ER (NAMENDA XR) 28 MG CP24 Take 28 mg by mouth daily.      . Multiple Vitamins-Minerals (MULTIVITAMIN WITH MINERALS) tablet Take 1 tablet by mouth daily.        . Red Yeast Rice Extract (RED YEAST RICE PO) Take 1 tablet by mouth daily.       . rosuvastatin (CRESTOR) 10 MG tablet Take 10 mg by mouth daily.        . traZODone (DESYREL) 50 MG tablet Take 50 mg by mouth at bedtime.        Marland Kitchen warfarin (COUMADIN) 5 MG tablet Take 2.5-5  mg by mouth daily. 5mg  SuMoFrSa; 2.5mg  Th; take none TuThu.       Anti-infectives   Start     Dose/Rate Route Frequency Ordered Stop   10/17/12 1000  amoxicillin (AMOXIL) capsule 500 mg  Status:  Discontinued     500 mg Oral Every 48 hours 10/15/12 1233 10/16/12 1143   10/16/12 1215  Ampicillin-Sulbactam (UNASYN) 3 g in sodium chloride 0.9 % 100 mL IVPB     3 g 100 mL/hr over 60 Minutes Intravenous Every 6 hours 10/16/12 1202     10/15/12 1330  amoxicillin (AMOXIL) capsule 500 mg  Status:  Discontinued     500 mg Oral  Once 10/15/12 1229 10/16/12 1143     Assessment:  77 y.o.m with  recent Afib and worsening heart failure who is on chronic Coumadin for atrial fibrillation. HOME Dose:  He takes Warfarin 5mg  on M/F/S/S, 2.5mg  on Wed. And nothing on T/Th  Drug/Drug Interactions:  Patient is currently on an Amiodarone infusion [he is on Amiodarone at home].  He is also on Unasyn for presumed PNA.  10/13:  INR now trending down after holding Warfarin doses for the last 48 hours.  Today his INR is 3.43 which is still above the desired goal range of 2.0-3.0.  He has no noted bleeding complications.   Goal:  INR 2-3   Plan: 1. NO Coumadin today. 2. Daily INR's, CBC.  Monitor for bleeding complications.    Nadara Mustard, PharmD., MS Clinical Pharmacist Pager:  [phone] Thank you for allowing pharmacy to be part of this patients care team. 10/18/2012, 8:52 AM   Addendum: Dr. Ladona Ridgel has opted to go ahead and give Warfarin 2mg  today in hopes of maintaining a therapeutic INR.   Plan: Will F/u am labs and dose accordingly.  Nadara Mustard, PharmD., MS

## 2012-10-18 NOTE — Progress Notes (Signed)
PULMONARY  / CRITICAL CARE MEDICINE  Name: Jon Butler MRN: [id number] DOB: 07-03-1935    ADMISSION DATE:  10/15/2012 CONSULTATION DATE:  10/15/12  REFERRING MD :  Sharrell Ku PRIMARY SERVICE:  Cardiology  CHIEF COMPLAINT:  Acute Respiratory Failure  BRIEF PATIENT DESCRIPTION:  58 M with NICM (EF 25%), recent onset AF. Admitted 10/10 for planned TEE cardioversion. Atrial appendage thrombus found on TEE and cardioversion was not performed. Developed severe respiratory distress with pulm edema pattern on CXR and wheezing. Transferred emergently to ICU and PCCM consulted.   SIGNIFICANT EVENTS / STUDIES:  10/10 Admitted for elective cardioversion  10/10 TEE: atrial appendage clot. No cardioversion performed  10/10 severe respiratory distress. Transfer to CCU emergently. Inadequately supported on NPPV. Intubated 10/12 extubated  LINES / TUBES: ETT 10/10 >> 10/12 L femoral CVL 10/10 >> will dc once inr improved  PICC (ordered 10/10) >>   CULTURES: MRSA by PCR 10/10 >>> negative Resp Cx 10/10 >>> NF Blood Cx 10/11 >>>   ANTIBIOTICS: Unasyn 10/11 >>>  SUBJECTIVE:  Pt laying comfortably in bed with wife at bedside. 98% O2 sat on RA  C/o hematochezia with cough. Reports a small BM yesterday.  He would like to get out of bed and start walking.  Denies chest pain, abdominal pain, or dyspnea.   VITAL SIGNS: Temp:  [98.5 F (36.9 C)-99 F (37.2 C)] 98.5 F (36.9 C) (10/13 0400) Pulse Rate:  [39-132] 88 (10/13 0600) Resp:  [14-24] 19 (10/13 0600) BP: (90-146)/(55-92) 104/76 mmHg (10/13 0600) SpO2:  [90 %-100 %] 94 % (10/13 0600) FiO2 (%):  [40 %] 40 % (10/12 1200) Weight:  [105.4 kg (232 lb 5.8 oz)] 105.4 kg (232 lb 5.8 oz) (10/13 0432)  PHYSICAL EXAMINATION: General:  Obese male, laying comfortably in bed, NAD Neuro:  A&O x 3, follows all commands HEENT:  Normocephalic, atraumatic, MMM, no scleral icterus Cardiovascular:  RRR, prominent S2 Lungs:  Bilateral inferior  crackles, insp/exp RUL rhonchi Abdomen:  Soft, NT/ND, +BS Musculoskeletal:  4+ bilateral UE and LE strength Skin:  Warm and dry, mild LE edema right>left   Recent Labs Lab 10/17/12 0430 10/17/12 1619 10/18/12 0500  NA 142 144 145  K 3.0* 3.8 3.4*  CL 102 104 104  CO2 29 30 30   BUN 41* 45* 41*  CREATININE 0.98 1.07 0.99  GLUCOSE 188* 151* 114*    Recent Labs Lab 10/15/12 1310 10/16/12 0500 10/17/12 0430  HGB 14.3 13.9 12.2*  HCT 42.2 40.9 36.2*  WBC 7.3 13.0* 10.0  PLT 117* 121* 94*   Dg Chest Port 1 View  10/18/2012   *RADIOLOGY REPORT*  Clinical Data: Assess for infiltrate.  PORTABLE CHEST - 1 VIEW  Comparison: Chest radiograph October 16, 2012.  Findings: The cardiac silhouette appears moderately enlarged unchanged, status post median sternotomy. Left cardiac defibrillator insitu.  Increasing central pulmonary vasculature congestion with worsening right, similar left perihilar and lower lobe air space opacities.  Small left pleural effusion.  No pneumothorax.  Right PICC in place, distal tip projecting cavoatrial junction. Multiple EKG lines overlay the patient and could obscure underlying subtle pathology. Interval extubation and removal of nasogastric tube.  IMPRESSION: Stable cardiomegaly with apparent worsening pulmonary edema most apparent on the right, with small pleural effusions.  Interval extubation removal of nasogastric tube, no apparent change in position of right PICC.   Original Report Authenticated By: Awilda Metro   Chest Xray:  10/13 Cardiomegaly, pulmonary edema right> left, worsening with bilateral pleural effusion  ASSESSMENT / PLAN:  PULMONARY  A:  Acute respiratory failure  Pulmonary edema  Bronchospasm - likely cardiac asthma  P:  -extubated yesterday, doing well- on RA 98% O2 sat -see renal  -see ID -continue neg balance -if hemoptysis increases = ffp  CARDIOVASCULAR  A: Severe cardiomyopathy  S/P mechanical aortic valve replacement,  remote  AFRVR  L atrial appendage clot  Poor venous access - difficult L femoral CVL placed  P:  Mgmt per Cards  Cont anticoagulation once inr less 2.5, pending resolution hemoptysis  cvp 14 this AM, continuing lasix per cards Off levophed, maintaining BP amio per cards   RENAL  A:  Mild ARI (baseline Cr 1.1)- improved Hypokalemia Edema increase pcxr 10/13 P:  K supp  kvo  improving on Lasix, will continue per cards, may need escallation Chem in am   GASTROINTESTINAL  A:  Thin Liquid Diet, tolerating well P:  IV famotidine   reassess swallowing, consider advancing diet  HEMATOLOGIC  A:  Chronic warfarin  Mild thrombocytopenia - predates admission  Bloody secretions resolving  P:  DVT px: full dose warfarin  Monitor CBC intermittently  Warfarin per pharm, INR noted and improving Consider dc fem line today, INR noted  INFECTIOUS  A:  Bilateral pulm infiltrates likely edema - ? PNA P:  Continue Unasyn, will establish stop date D/C fem line today     ENDOCRINE  A:  Mild stress induced hyperglycemia - improved, no prior DM  P:  CBGs q 8 hrs  SSI if glu > 180   NEUROLOGIC  A:  Agitation without delirium- resolving  P:  Avoid versed if able  On Seroquel and trazadone QHS  Today's Summary: Pt is doing well off of the ventilator. 98% O2 sat on RA, will monitor closely.  Repeat chest xray in AM, follow WBC and temp.  80 mg Lasix continued per cards, 2.7L urine output in last 24 hours. Will consider removal of Foley Femoral line removal will be reassessed by cards later today.   Cephus Shelling, PA-Student, [facility]  I have fully examined this patient and agree with above findings.     Mcarthur Rossetti. Tyson Alias, MD, FACP Pgr: [phone]  Pulmonary & Critical Care  Pulmonary and Critical Care Medicine [facility] Pager: [phone]  10/18/2012, 9:10 AM

## 2012-10-18 NOTE — Progress Notes (Signed)
NUTRITION FOLLOW UP  Intervention:   1.  Supplements; Ensure Complete po PRN, each supplement provides 350 kcal and 13 grams of protein. 2.  General healthful diet; encourage PO intake.  Discussed effects of medication on taste.  Encouraged pt to request hot sauce with meals.   Nutrition Dx:   Inadequate oral intake, ongoing  Monitor:   1.  Enteral nutrition; initiation with tolerance.  Enteral nutrition to provide 60-70% of estimated calorie needs (22-25 kcals/kg ideal body weight) and 100% of estimated protein needs, based on ASPEN guidelines for permissive underfeeding in critically ill obese individuals.  No longer appropriate.  Discontinue 2.  Food/Beverage; pt meeting >/=90% estimated needs with tolerance. 2.  Wt/wt change; monitor trends.  Met, ongoing.   Assessment:   Patient with new onset AF; admitted for planned TEE cardioversion -- atrial appendage thrombus found on TEE and cardioversion was not performed; developed severe respiratory distress with pulmonary edema pattern on CXR and wheezing; transferred emergently to ICU.  Pt has been extubated.  Pt has started Dysphagia 2, thin liquid. Pt states his appetite is "OK" and feels he would eat more if pt was abe to get food he liked with hot sauce.  RD will request foodservice begin rounding on pt and bring hot sauce.   Height: Ht Readings from Last 1 Encounters:  10/15/12 5\' 10"  (1.778 m)    Weight Status:   Wt Readings from Last 1 Encounters:  10/18/12 232 lb 5.8 oz (105.4 kg)    Re-estimated needs:  Kcal: 1900-2110 Protein: 90-105g Fluid: ~2.0 L/day  Skin: intact  Diet Order: Dysphagia 2, thin   Intake/Output Summary (Last 24 hours) at 10/18/12 1015 Last data filed at 10/18/12 1000  Gross per 24 hour  Intake 1915.57 ml  Output   2445 ml  Net -529.43 ml    Last BM: 10/12  Labs:   Recent Labs Lab 10/17/12 0430 10/17/12 1619 10/18/12 0500  NA 142 144 145  K 3.0* 3.8 3.4*  CL 102 104 104  CO2 29 30  30   BUN 41* 45* 41*  CREATININE 0.98 1.07 0.99  CALCIUM 7.8* 8.2* 8.0*  MG  --   --  2.1  PHOS  --   --  3.3  GLUCOSE 188* 151* 114*    CBG (last 3)   Recent Labs  10/17/12 2021 10/18/12 0016 10/18/12 0429  GLUCAP 143* 98 102*    Scheduled Meds: . allopurinol  300 mg Oral Daily  . ampicillin-sulbactam (UNASYN) IVPB 3 g  3 g Intravenous Q6H  . atorvastatin  20 mg Oral q1800  . famotidine (PEPCID) IV  20 mg Intravenous Q12H  . furosemide  40 mg Intravenous Once  . furosemide  80 mg Intravenous Q12H  . insulin aspart  1-3 Units Subcutaneous Q4H  . levalbuterol  0.63 mg Nebulization Q6H  . multivitamin with minerals  1 tablet Oral Daily  . potassium chloride  10 mEq Intravenous Q1 Hr x 4  . QUEtiapine  50 mg Oral QHS  . sodium chloride  10-40 mL Intracatheter Q12H  . traZODone  50 mg Oral QHS  . Warfarin - Pharmacist Dosing Inpatient   Does not apply q1800    Continuous Infusions: . sodium chloride 10 mL/hr at 10/18/12 0800  . sodium chloride 10 mL/hr at 10/18/12 0800  . amiodarone (NEXTERONE PREMIX) 360 mg/200 mL dextrose 30 mg/hr (10/18/12 0917)  . norepinephrine (LEVOPHED) Adult infusion Stopped (10/17/12 1529)    Loyce Dys, MS RD LDN  Clinical Inpatient Dietitian Pager: [phone] Weekend/After hours pager: [phone]

## 2012-10-18 NOTE — Progress Notes (Signed)
patient refused bath this am; allowed foley care and linen change. Son aware and in agreement

## 2012-10-18 NOTE — Progress Notes (Signed)
Speech Language Pathology Treatment: Dysphagia  Patient Details Name: Jon Butler MRN: [id number] DOB: 06-23-1935 Today's Date: 10/18/2012 Time: 4098-1191 SLP Time Calculation (min): 20 min  Assessment / Plan / Recommendation Clinical Impression  Dysphagia treatment provided at bedside with wife present. Pt continues to present with mild confusion, educated provided to pt and spouse regarding compensatory strategies for safe swallowing with use of teach-back. Pt observed with cup sips of thin liquid, per palpation pt with multiple swallows with appropriate laryngeal elevation. Pt with delayed cough and regurgitation after 2 out of 3 trials of cup sips of thin liquid, suspect secondary to esophageal deficit. Recommend GI consult to determine esophageal deficit. Pt observed with bloody secretions, RN informed.         SLP Plan  Continue with current plan of care;Consult other service (comment) (GI)    Recommendations Diet recommendations: Dysphagia 2 (fine chop);Thin liquid Liquids provided via: Cup Medication Administration: Whole meds with puree Supervision: Patient able to self feed;Full supervision/cueing for compensatory strategies Compensations: Slow rate;Small sips/bites;Multiple dry swallows after each bite/sip;Follow solids with liquid;Clear throat intermittently Postural Changes and/or Swallow Maneuvers: Seated upright 90 degrees;Upright 30-60 min after meal              Oral Care Recommendations: Oral care Q4 per protocol Plan: Continue with current plan of care;Consult other service (comment) (GI)    GO     Chyrel Masson MA CCC-SLP 10/18/2012, 3:46 PM

## 2012-10-19 ENCOUNTER — Inpatient Hospital Stay (HOSPITAL_COMMUNITY): Payer: Medicare Other

## 2012-10-19 LAB — COMPREHENSIVE METABOLIC PANEL
Albumin: 3 g/dL — ABNORMAL LOW (ref 3.5–5.2)
Alkaline Phosphatase: 111 U/L (ref 39–117)
BUN: 40 mg/dL — ABNORMAL HIGH (ref 6–23)
CO2: 28 mEq/L (ref 19–32)
Calcium: 8.3 mg/dL — ABNORMAL LOW (ref 8.4–10.5)
Chloride: 101 mEq/L (ref 96–112)
Creatinine, Ser: 1.13 mg/dL (ref 0.50–1.35)
GFR calc non Af Amer: 62 mL/min — ABNORMAL LOW (ref >90)
Potassium: 3.4 mEq/L — ABNORMAL LOW (ref 3.5–5.1)
Total Bilirubin: 1.1 mg/dL (ref 0.3–1.2)

## 2012-10-19 LAB — HEPATITIS PANEL, ACUTE
Hep B C IgM: NONREACTIVE
Hepatitis B Surface Ag: NEGATIVE

## 2012-10-19 LAB — PROTIME-INR
INR: 2.58 — ABNORMAL HIGH (ref 0.00–1.49)
Prothrombin Time: 26.8 seconds — ABNORMAL HIGH (ref 11.6–15.2)

## 2012-10-19 LAB — CBC WITH DIFFERENTIAL/PLATELET
Basophils Relative: 0 % (ref 0–1)
Eosinophils Relative: 0 % (ref 0–5)
HCT: 39.5 % (ref 39.0–52.0)
Hemoglobin: 12.6 g/dL — ABNORMAL LOW (ref 13.0–17.0)
Lymphocytes Relative: 18 % (ref 12–46)
Lymphs Abs: 1.4 10*3/uL (ref 0.7–4.0)
MCHC: 31.9 g/dL (ref 30.0–36.0)
MCV: 98.5 fL (ref 78.0–100.0)
Monocytes Relative: 12 % (ref 3–12)
Neutro Abs: 5.6 10*3/uL (ref 1.7–7.7)
Neutrophils Relative %: 70 % (ref 43–77)
RBC: 4.01 MIL/uL — ABNORMAL LOW (ref 4.22–5.81)
RDW: 15.3 % (ref 11.5–15.5)
WBC: 8 10*3/uL (ref 4.0–10.5)

## 2012-10-19 LAB — AMYLASE: Amylase: 566 U/L — ABNORMAL HIGH (ref 0–105)

## 2012-10-19 LAB — LACTIC ACID, PLASMA: Lactic Acid, Venous: 2.7 mmol/L — ABNORMAL HIGH (ref 0.5–2.2)

## 2012-10-19 LAB — GLUCOSE, CAPILLARY
Glucose-Capillary: 100 mg/dL — ABNORMAL HIGH (ref 70–99)
Glucose-Capillary: 101 mg/dL — ABNORMAL HIGH (ref 70–99)

## 2012-10-19 MED ORDER — SODIUM CHLORIDE 0.9 % IV SOLN
INTRAVENOUS | Status: DC | PRN
  Administered 2012-10-23: 04:00:00 via INTRAVENOUS
  Administered 2012-10-24: 250 mL via INTRAVENOUS

## 2012-10-19 MED ORDER — POTASSIUM CHLORIDE 20 MEQ/15ML (10%) PO LIQD
40.0000 meq | Freq: Once | ORAL | Status: AC
  Administered 2012-10-19: 40 meq via ORAL
  Filled 2012-10-19: qty 30

## 2012-10-19 MED ORDER — WARFARIN SODIUM 4 MG PO TABS
4.0000 mg | ORAL_TABLET | Freq: Once | ORAL | Status: AC
  Administered 2012-10-19: 4 mg via ORAL
  Filled 2012-10-19: qty 1

## 2012-10-19 MED ORDER — POTASSIUM CHLORIDE 10 MEQ/100ML IV SOLN: 10.0000 meq | INTRAVENOUS | Status: DC

## 2012-10-19 MED ORDER — FAMOTIDINE 20 MG PO TABS
20.0000 mg | ORAL_TABLET | Freq: Two times a day (BID) | ORAL | Status: DC
  Administered 2012-10-19 – 2012-10-26 (×15): 20 mg via ORAL
  Filled 2012-10-19 (×17): qty 1

## 2012-10-19 MED ORDER — AMIODARONE HCL 200 MG PO TABS
400.0000 mg | ORAL_TABLET | Freq: Every day | ORAL | Status: DC
  Administered 2012-10-19 – 2012-10-26 (×8): 400 mg via ORAL
  Filled 2012-10-19 (×8): qty 2

## 2012-10-19 MED ORDER — ALUM & MAG HYDROXIDE-SIMETH 200-200-20 MG/5ML PO SUSP
30.0000 mL | ORAL | Status: DC | PRN
  Administered 2012-10-19: 30 mL via ORAL
  Filled 2012-10-19: qty 30

## 2012-10-19 NOTE — Progress Notes (Signed)
PHARMACY FOLLOW UP NOTE   Pharmacy Consult for : Coumadin Indication: atrial fibrillation  Dosing Weight: 104 kg  Labs:  Recent Labs  10/16/12 1544 10/17/12 0430 10/17/12 1619 10/18/12 0500 10/18/12 1130 10/19/12 0628  HGB  --  12.2*  --   --  11.6* 12.6*  HCT  --  36.2*  --   --  35.3* 39.5  PLT  --  94*  --   --  87* 104*  APTT  --  56*  --   --   --   --   LABPROT  --  43.9*  --  33.3*  --  26.8*  INR  --  4.92*  --  3.43*  --  2.58*  CREATININE 1.23 0.98 1.07 0.99  --  1.13   Lab Results  Component Value Date   INR 2.58* 10/19/2012   INR 3.43* 10/18/2012   INR 4.92* 10/17/2012   Estimated Creatinine Clearance: 68.5 ml/min (by C-G formula based on Cr of 1.13).  Pertinent Medications:  Prescriptions prior to admission  Medication Sig Dispense Refill  . allopurinol (ZYLOPRIM) 300 MG tablet Take 300 mg by mouth daily.        Marland Kitchen amiodarone (PACERONE) 400 MG tablet Take 1 tablet (400 mg total) by mouth daily.  30 tablet  4  . amoxicillin (AMOXIL) 500 MG capsule Take 500 mg by mouth every other day.       . captopril (CAPOTEN) 50 MG tablet Take 50 mg by mouth 3 (three) times daily.        . carvedilol (COREG) 25 MG tablet Take 25 mg by mouth 2 (two) times daily with a meal.      . Coenzyme Q10 (CO Q-10 PO) Take by mouth daily.        . colchicine 0.6 MG tablet Take 0.6 mg by mouth as needed.       . furosemide (LASIX) 40 MG tablet Take 40 mg by mouth daily.        . memantine (NAMENDA) 5 MG tablet Take 5 mg by mouth daily.      . Memantine HCl ER (NAMENDA XR) 28 MG CP24 Take 28 mg by mouth daily.      . Multiple Vitamins-Minerals (MULTIVITAMIN WITH MINERALS) tablet Take 1 tablet by mouth daily.        . Red Yeast Rice Extract (RED YEAST RICE PO) Take 1 tablet by mouth daily.       . rosuvastatin (CRESTOR) 10 MG tablet Take 10 mg by mouth daily.        . traZODone (DESYREL) 50 MG tablet Take 50 mg by mouth at bedtime.        Marland Kitchen warfarin (COUMADIN) 5 MG tablet Take 2.5-5  mg by mouth daily. 5mg  SuMoFrSa; 2.5mg  Th; take none TuThu.       Anti-infectives   Start     Dose/Rate Route Frequency Ordered Stop   10/17/12 1000  amoxicillin (AMOXIL) capsule 500 mg  Status:  Discontinued     500 mg Oral Every 48 hours 10/15/12 1233 10/16/12 1143   10/16/12 1215  Ampicillin-Sulbactam (UNASYN) 3 g in sodium chloride 0.9 % 100 mL IVPB     3 g 100 mL/hr over 60 Minutes Intravenous Every 6 hours 10/16/12 1202 10/21/12 2359   10/15/12 1330  amoxicillin (AMOXIL) capsule 500 mg  Status:  Discontinued     500 mg Oral  Once 10/15/12 1229 10/16/12 1143     Assessment:  77  y.o.m with recent Afib and worsening heart failure who is on chronic Coumadin for atrial fibrillation. HOME Dose:  He takes Warfarin 5mg  on M/F/S/S, 2.5mg  on Wed. And nothing on T/Th  Drug/Drug Interactions:  Patient is currently on an Amiodarone infusion [he is on Amiodarone at home].  He is also on Unasyn for presumed PNA.  10/13:  INR now trending down after holding Warfarin doses for the last 48 hours.  Today his INR is 3.43 which is still above the desired goal range of 2.0-3.0.  He has no noted bleeding complications.  10/14: INR now within goal range  Goal:  INR 2-3   Plan: 1. Coumadin 4 mg po x 1 dose today 2. Daily INR's, CBC.  Monitor for bleeding complications.    Thank you. Okey Regal, PharmD [phone]  10/19/2012, 8:11 AM

## 2012-10-19 NOTE — Progress Notes (Addendum)
Found patient playing with PICC line tubing and POX probe , patient stated that he needed to get this line into this one; son is at bedside.  Notified MD patient pulled out PICC and 2 PIV were placed.

## 2012-10-19 NOTE — Progress Notes (Signed)
PULMONARY  / CRITICAL CARE MEDICINE  Name: Jon Butler MRN: [id number] DOB: 04-19-1935    ADMISSION DATE:  10/15/2012 CONSULTATION DATE:  10/15/12  REFERRING MD :  Sharrell Ku PRIMARY SERVICE:  Cardiology  CHIEF COMPLAINT:  Acute Respiratory Failure  BRIEF PATIENT DESCRIPTION:  17 M with NICM (EF 25%), recent onset AF. Admitted 10/10 for planned TEE cardioversion. Atrial appendage thrombus found on TEE and cardioversion was not performed. Developed severe respiratory distress with pulm edema pattern on CXR and wheezing. Transferred emergently to ICU and PCCM consulted.   SIGNIFICANT EVENTS / STUDIES:  10/10 Admitted for elective cardioversion  10/10 TEE: atrial appendage clot. No cardioversion performed  10/10 severe respiratory distress. Transfer to CCU emergently. Inadequately supported on NPPV. Intubated 10/12 extubated  LINES / TUBES: ETT 10/10 >> 10/12 L femoral CVL 10/10 >> 10/13 PICC (ordered 10/10) >> 10/14 (pt pulled out)  CULTURES: MRSA by PCR 10/10 >>> negative Resp Cx 10/10 >>> NF Blood Cx 10/11 >>>   ANTIBIOTICS: Unasyn 10/11 >>>  SUBJECTIVE:  Pt states he slept well through the night, 99% O2 sat on RA.  Poor appetite due to food not tasting good, denies dysphagia. C/o hemoptysis, same amount as yesterday. Although, nurse had not mentioned personally seeing any.   Pt pulled out PICC line and pulse Ox early AM. When asked about what happened, pt was unable to recall the event.  Denies chest pain, abdominal pain, or dyspnea.   VITAL SIGNS: Temp:  [97 F (36.1 C)-98.2 F (36.8 C)] 97.5 F (36.4 C) (10/14 0418) Pulse Rate:  [49-104] 104 (10/14 0000) Resp:  [12-29] 20 (10/14 0600) BP: (108-162)/(72-108) 113/89 mmHg (10/14 0600) SpO2:  [94 %-100 %] 95 % (10/14 0418) Weight:  [104.8 kg (231 lb 0.7 oz)] 104.8 kg (231 lb 0.7 oz) (10/14 0410)  PHYSICAL EXAMINATION: General: Pleasant, obese male, laying comfortably in bed, NAD Neuro:  A&O x 3, confused  about recent events follows all commands HEENT:  Normocephalic, atraumatic, MMM, no scleral icterus Cardiovascular:  Mild tachy, regular rhythm, prominent S2 Lungs:  Bilateral inferior crackles, insp/exp RUL rhonchi Abdomen:  Soft, NT/ND, +BS Musculoskeletal:  4+ bilateral UE and LE strength Skin:  Warm and dry, mild LE edema right>left   Recent Labs Lab 10/17/12 0430 10/17/12 1619 10/18/12 0500  NA 142 144 145  K 3.0* 3.8 3.4*  CL 102 104 104  CO2 29 30 30   BUN 41* 45* 41*  CREATININE 0.98 1.07 0.99  GLUCOSE 188* 151* 114*    Recent Labs Lab 10/17/12 0430 10/18/12 1130 10/19/12 0628  HGB 12.2* 11.6* 12.6*  HCT 36.2* 35.3* 39.5  WBC 10.0 8.3 8.0  PLT 94* 87* 104*   Dg Chest Port 1 View  10/19/2012   CLINICAL DATA:  Bilateral pulmonary infiltrates. Shortness of breath.  EXAM: PORTABLE CHEST - 1 VIEW  COMPARISON:  10/18/2012  FINDINGS: Interval removal of right upper extremity PICC. No interval displacement of biventricular ICD/pacer wires. Stable cardiomegaly. Improved lung aeration with diminishing bilateral airspace and interstitial opacities. No increasing pleural fluid. No pneumothorax.  IMPRESSION: Decreased pulmonary edema and improving lung aeration.   Electronically Signed   By: Tiburcio Pea M.D.   On: 10/19/2012 06:50   Dg Chest Port 1 View  10/18/2012   *RADIOLOGY REPORT*  Clinical Data: Assess for infiltrate.  PORTABLE CHEST - 1 VIEW  Comparison: Chest radiograph October 16, 2012.  Findings: The cardiac silhouette appears moderately enlarged unchanged, status post median sternotomy. Left cardiac defibrillator insitu.  Increasing central pulmonary vasculature congestion with worsening right, similar left perihilar and lower lobe air space opacities.  Small left pleural effusion.  No pneumothorax.  Right PICC in place, distal tip projecting cavoatrial junction. Multiple EKG lines overlay the patient and could obscure underlying subtle pathology. Interval extubation  and removal of nasogastric tube.  IMPRESSION: Stable cardiomegaly with apparent worsening pulmonary edema most apparent on the right, with small pleural effusions.  Interval extubation removal of nasogastric tube, no apparent change in position of right PICC.   Original Report Authenticated By: Awilda Metro   Chest Xray:  10/14 Cardiomegaly, bilateral pulmonary edema improving  ASSESSMENT / PLAN:  PULMONARY  A:  Acute respiratory failure  Pulmonary edema  Bronchospasm - likely cardiac asthma -resolving P:  -extubated 10/12, doing well- on RA -pcxr resolving edema, crt rise, may need hold lasix, or redcution  CARDIOVASCULAR  A: Severe cardiomyopathy  S/P mechanical aortic valve replacement, remote  AFRVR  L atrial appendage clot  Poor venous access - difficult L femoral CVL placed  P:  Mgmt per Cards  Cont anticoagulation once INR less 2.5, (2.58 10/14)                     -pending resolution hemoptysis  Unable to get CVP, pt pulled out PICC line early AM continuing lasix per cards amio per cards   RENAL  A:  Mild ARI (baseline Cr 1.1)- improved Hypokalemia, 3.4 10/14 Edema decreased pcxr 10/14 P:  K supp  kvo  Worse with lasix, consider hold Chem in am   GASTROINTESTINAL  A:  Thin Liquid Diet, tolerating well Elevated transamninases with normal Bili - r/o shock liver, med induced Assess hep panel P:  IV famotidine  Cards ordering [place], not a picture of obstruction, r/o shock liver, as on pressors etc Follow trend LFt in am  Assess lactic, ldh, amy, lip Caution couamdin  HEMATOLOGIC  A:  Chronic warfarin  Mild thrombocytopenia - predates admission  Bloody secretions resolving  P:  DVT px: full dose warfarin, per pharm, INR noted and improving Monitor CBC intermittently  Caution coumadin dosing with LFt noted  INFECTIOUS  A:  Bilateral pulm infiltrates likely edema - ? PNA P:  Continue Unasyn, day 3/6  ENDOCRINE  A:  Mild stress induced  hyperglycemia - improved, no prior DM  P:  D/c CBG checks, will monitor with BMET, glucose has been trending down, no Hx DM SSI if glu > 180   NEUROLOGIC  A:  Agitation without delirium- resolving  P:  Avoid versed if able  On Seroquel and trazadone QHS  Today's Summary: Clinically, pt seems to be improving. Repeat chest xray in AM, follow WBC and temp.  May need to hold lasix Liver work up  Cephus Shelling, PA-Student, [employer]  I have fully examined this patient and agree with above findings.     Mcarthur Rossetti. Tyson Alias, MD, FACP Pgr: [phone] [place] [facility]  Pulmonary and Critical Care Medicine [facility] Pager: [phone]  10/19/2012, 7:23 AM

## 2012-10-19 NOTE — Progress Notes (Signed)
SUBJECTIVE: Still with confusion, pulled out PICC line last night.   Thinks his breathing his improved. Still with hemoptysis.   I/O +776 yesterday, weight down 1 pound. Labs pending this morning.   CURRENT MEDICATIONS: . allopurinol  300 mg Oral Daily  . ampicillin-sulbactam (UNASYN) IVPB 3 g  3 g Intravenous Q6H  . atorvastatin  20 mg Oral q1800  . famotidine (PEPCID) IV  20 mg Intravenous Q12H  . furosemide  40 mg Intravenous Once  . furosemide  80 mg Intravenous Q8H  . influenza vac split quadrivalent PF  0.5 mL Intramuscular Tomorrow-1000  . insulin aspart  1-3 Units Subcutaneous Q4H  . levalbuterol  0.63 mg Nebulization Q6H  . multivitamin with minerals  1 tablet Oral Daily  . QUEtiapine  50 mg Oral QHS  . sodium chloride  10-40 mL Intracatheter Q12H  . traZODone  50 mg Oral QHS  . warfarin  2 mg Oral q1800  . Warfarin - Pharmacist Dosing Inpatient   Does not apply q1800   . sodium chloride 10 mL/hr at 10/18/12 0800  . sodium chloride Stopped (10/19/12 0200)  . amiodarone (NEXTERONE PREMIX) 360 mg/200 mL dextrose 30 mg/hr (10/18/12 2145)    OBJECTIVE: Physical Exam: Filed Vitals:   10/19/12 0410 10/19/12 0418 10/19/12 0500 10/19/12 0600  BP:  134/89 119/85 113/89  Pulse:      Temp:  97.5 F (36.4 C)    TempSrc:  Oral    Resp:  21 21 20   Height:      Weight: 231 lb 0.7 oz (104.8 kg)     SpO2:  95%      Intake/Output Summary (Last 24 hours) at 10/19/12 0703 Last data filed at 10/19/12 0400  Gross per 24 hour  Intake 2622.4 ml  Output   1846 ml  Net  776.4 ml    Telemetry reveals atrial fibrillation, rates 80's-110's  GEN- The patient is well appearing Head- normocephalic, atraumatic Eyes-  Sclera clear, conjunctiva pink Ears- hearing intact Oropharynx- clear Neck- supple, no JVP Lungs- Clear to ausculation bilaterally, normal work of breathing Heart- IRegular rate and rhythm, no murmurs, rubs or gallops, PMI not laterally displaced GI- soft, NT,  ND, + BS Extremities- no clubbing, cyanosis, or edema Skin- no rash or lesion Neuro- strength and sensation are intact  LABS: Basic Metabolic Panel:  Recent Labs  16/10/96 1619 10/18/12 0500  NA 144 145  K 3.8 3.4*  CL 104 104  CO2 30 30  GLUCOSE 151* 114*  BUN 45* 41*  CREATININE 1.07 0.99  CALCIUM 8.2* 8.0*  MG  --  2.1  PHOS  --  3.3   Liver Function Tests:  Recent Labs  10/17/12 0430  AST 18  ALT 29  ALKPHOS 46  BILITOT 1.0  PROT 5.5*  ALBUMIN 2.8*   CBC:  Recent Labs  10/18/12 1130 10/19/12 0628  WBC 8.3 8.0  NEUTROABS 6.3 5.6  HGB 11.6* 12.6*  HCT 35.3* 39.5  MCV 98.6 98.5  PLT 87* 104*    RADIOLOGY: Dg Chest Port 1 View 10/18/2012   *RADIOLOGY REPORT*  Clinical Data: Assess for infiltrate.  PORTABLE CHEST - 1 VIEW  Comparison: Chest radiograph October 16, 2012.  Findings: The cardiac silhouette appears moderately enlarged unchanged, status post median sternotomy. Left cardiac defibrillator insitu.  Increasing central pulmonary vasculature congestion with worsening right, similar left perihilar and lower lobe air space opacities.  Small left pleural effusion.  No pneumothorax.  Right PICC in place,  distal tip projecting cavoatrial junction. Multiple EKG lines overlay the patient and could obscure underlying subtle pathology. Interval extubation and removal of nasogastric tube.  IMPRESSION: Stable cardiomegaly with apparent worsening pulmonary edema most apparent on the right, with small pleural effusions.  Interval extubation removal of nasogastric tube, no apparent change in position of right PICC.   Original Report Authenticated By: Awilda Metro   ASSESSMENT AND PLAN:  Active Problems:   Acute respiratory failure with hypoxia   Pulmonary edema   Cardiac asthma   Cardiomyopathy   Poor venous access atrial fibrillation LAA thrombus Hepatitis, likely due to shock liver Rec: Will continue IV lasix and follow electrolytes. Will check ultrasound of  the liver to rule out obstruction in the setting of transaminasemia. Continue coumadin. He has now had 3 weeks of therapeutic INR's and will need one more week in the setting of LAA thrombus. Will not plan to repeat TEE once therapeutic. Will switch to po amio today and recheck CXR.  Leonia Reeves.D.

## 2012-10-20 ENCOUNTER — Inpatient Hospital Stay (HOSPITAL_COMMUNITY): Payer: Medicare Other

## 2012-10-20 ENCOUNTER — Encounter (HOSPITAL_COMMUNITY): Payer: Self-pay | Admitting: Radiology

## 2012-10-20 DIAGNOSIS — I5022 Chronic systolic (congestive) heart failure: Secondary | ICD-10-CM

## 2012-10-20 DIAGNOSIS — I998 Other disorder of circulatory system: Secondary | ICD-10-CM

## 2012-10-20 DIAGNOSIS — J811 Chronic pulmonary edema: Secondary | ICD-10-CM

## 2012-10-20 LAB — CBC WITH DIFFERENTIAL/PLATELET
Eosinophils Relative: 1 % (ref 0–5)
HCT: 39.7 % (ref 39.0–52.0)
Hemoglobin: 13.7 g/dL (ref 13.0–17.0)
Lymphocytes Relative: 21 % (ref 12–46)
Lymphs Abs: 1.8 10*3/uL (ref 0.7–4.0)
MCHC: 34.5 g/dL (ref 30.0–36.0)
MCV: 95.9 fL (ref 78.0–100.0)
Monocytes Absolute: 1.3 10*3/uL — ABNORMAL HIGH (ref 0.1–1.0)
Platelets: 122 10*3/uL — ABNORMAL LOW (ref 150–400)
RBC: 4.14 MIL/uL — ABNORMAL LOW (ref 4.22–5.81)
WBC: 8.6 10*3/uL (ref 4.0–10.5)

## 2012-10-20 LAB — HEPATIC FUNCTION PANEL
AST: 344 U/L — ABNORMAL HIGH (ref 0–37)
Albumin: 3 g/dL — ABNORMAL LOW (ref 3.5–5.2)
Bilirubin, Direct: 0.3 mg/dL (ref 0.0–0.3)
Total Bilirubin: 1.4 mg/dL — ABNORMAL HIGH (ref 0.3–1.2)

## 2012-10-20 LAB — BASIC METABOLIC PANEL
CO2: 25 mEq/L (ref 19–32)
Calcium: 8.4 mg/dL (ref 8.4–10.5)
Creatinine, Ser: 1.12 mg/dL (ref 0.50–1.35)
Glucose, Bld: 95 mg/dL (ref 70–99)
Potassium: 3.7 mEq/L (ref 3.5–5.1)

## 2012-10-20 LAB — PROTIME-INR: INR: 2.74 — ABNORMAL HIGH (ref 0.00–1.49)

## 2012-10-20 LAB — LIPASE, BLOOD: Lipase: 836 U/L — ABNORMAL HIGH (ref 11–59)

## 2012-10-20 MED ORDER — WARFARIN SODIUM 2 MG PO TABS
2.0000 mg | ORAL_TABLET | Freq: Once | ORAL | Status: AC
  Administered 2012-10-20: 2 mg via ORAL
  Filled 2012-10-20: qty 1

## 2012-10-20 MED ORDER — LEVALBUTEROL HCL 0.63 MG/3ML IN NEBU
0.6300 mg | INHALATION_SOLUTION | Freq: Two times a day (BID) | RESPIRATORY_TRACT | Status: DC
  Administered 2012-10-20 – 2012-10-22 (×4): 0.63 mg via RESPIRATORY_TRACT
  Filled 2012-10-20 (×6): qty 3

## 2012-10-20 NOTE — Progress Notes (Signed)
PULMONARY  / CRITICAL CARE MEDICINE  Name: Jon Butler MRN: [id number] DOB: [birth year]    ADMISSION DATE:  10/15/2012 CONSULTATION DATE:  10/15/12  REFERRING MD :  Sharrell Ku PRIMARY SERVICE:  Cardiology  CHIEF COMPLAINT:  Acute Respiratory Failure  BRIEF PATIENT DESCRIPTION:  26 M with NICM (EF 25%), recent onset AF. Admitted 10/10 for planned TEE cardioversion. Atrial appendage thrombus found on TEE and cardioversion was not performed. Developed severe respiratory distress with pulm edema pattern on CXR and wheezing. Transferred emergently to ICU and PCCM consulted.   SIGNIFICANT EVENTS / STUDIES:  10/10 Admitted for elective cardioversion  10/10 TEE: atrial appendage clot. No cardioversion performed  10/10 severe respiratory distress. Transfer to CCU emergently. Inadequately supported on NPPV. Intubated 10/12 extubated  LINES / TUBES: ETT 10/10 >> 10/12 L femoral CVL 10/10 >> 10/13 PICC (ordered 10/10) >> 10/14 (pt pulled out) PIV x 2 10/14  CULTURES: MRSA by PCR 10/10 >>> negative Resp Cx 10/10 >>> NF Blood Cx 10/11 >>>   ANTIBIOTICS: Unasyn 10/11 >>>10/16  SUBJECTIVE:  Pt states he slept well through the night, on 2L O2 nasal canula  States hemoptysis is very minimal, much improvement since yesterday.  Denies chest pain, abdominal pain, dyspnea, or abnormal bleeding.   Able to verbalize when he needs to urinate.  VITAL SIGNS: Temp:  [97.5 F (36.4 C)-98.3 F (36.8 C)] 98.1 F (36.7 C) (10/15 0400) Pulse Rate:  [33-91] 51 (10/14 1620) Resp:  [18-24] 18 (10/15 0400) BP: (110-137)/(80-106) 117/82 mmHg (10/15 0400) SpO2:  [92 %-100 %] 97 % (10/15 0726) Weight:  [103.9 kg (229 lb 0.9 oz)] 103.9 kg (229 lb 0.9 oz) (10/15 0500)  PHYSICAL EXAMINATION: General: Pleasant, obese male, laying comfortably in bed, NAD Neuro:  A&O x 3, confused about recent events follows all commands HEENT:  Normocephalic, atraumatic, MMM, no scleral icterus Cardiovascular:   Irregular rhythm, prominent S2 Lungs:  Bilateral inspiratory base crackles right>left Abdomen:  Soft, NT/ND, +BS Musculoskeletal:  4+ bilateral UE and LE strength Skin:  Warm and dry, mild LE edema left>right   Recent Labs Lab 10/18/12 0500 10/19/12 0628 10/20/12 0415  NA 145 141 139  K 3.4* 3.4* 3.7  CL 104 101 99  CO2 30 28 25   BUN 41* 40* 41*  CREATININE 0.99 1.13 1.12  GLUCOSE 114* 114* 95    Recent Labs Lab 10/18/12 1130 10/19/12 0628 10/20/12 0415  HGB 11.6* 12.6* 13.7  HCT 35.3* 39.5 39.7  WBC 8.3 8.0 8.6  PLT 87* 104* 122*   US Abdomen Complete  10/19/2012   CLINICAL DATA:  Hepatitis. Elevated liver function tests, amylase and lipase.  EXAM: ULTRASOUND ABDOMEN COMPLETE  COMPARISON:  None.  FINDINGS: Gallbladder  Sludge is identified within the gallbladder. Sonographer reports negative Murphy's sign. The gallbladder wall appears mildly thickened. No stones are identified. A tiny amount of fluid is present about the gallbladder and the gallbladder wall is mildly thickened.  Common bile duct  Diameter: 0.6 cm.  Liver  Liver echotexture is heterogeneous in increased. The liver border is nodular. No focal lesion or intrahepatic biliary ductal dilatation is identified. There is hepatopetal flow in the portal vein.  IVC  No abnormality visualized.  Pancreas  Visualized portion unremarkable.  Spleen  Size and appearance within normal limits.  Right Kidney  Length: 11.8 cm Echogenicity within normal limits. No mass or hydronephrosis visualized.  Left Kidney  Length: 11.7 cm Echogenicity within normal limits. No mass or hydronephrosis visualized.  Abdominal  aorta  No aneurysm visualized.  IMPRESSION: Gallbladder sludge with a small amount of pericholecystic fluid. Minimal gallbladder wall thickening is likely related to fluid rather than cholecystitis.  Fatty infiltration of the liver. The liver border appears nodular suggesting cirrhosis.   Electronically Signed   By: Drusilla Kanner  M.D.   On: 10/19/2012 19:11   Dg Chest Port 1 View  10/20/2012   CLINICAL DATA:  Short of breath. Pulmonary edema  EXAM: PORTABLE CHEST - 1 VIEW  COMPARISON:  10/19/2012  FINDINGS: Continued improvement in bilateral airspace disease most suggestive of pulmonary edema. Mild atelectasis in the lung bases. No effusion.  IMPRESSION: Continued improvement in pulmonary edema.   Electronically Signed   By: Marlan Palau M.D.   On: 10/20/2012 07:11   Dg Chest Port 1 View  10/19/2012   CLINICAL DATA:  Bilateral pulmonary infiltrates. Shortness of breath.  EXAM: PORTABLE CHEST - 1 VIEW  COMPARISON:  10/18/2012  FINDINGS: Interval removal of right upper extremity PICC. No interval displacement of biventricular ICD/pacer wires. Stable cardiomegaly. Improved lung aeration with diminishing bilateral airspace and interstitial opacities. No increasing pleural fluid. No pneumothorax.  IMPRESSION: Decreased pulmonary edema and improving lung aeration.   Electronically Signed   By: Tiburcio Pea M.D.   On: 10/19/2012 06:50   Chest Xray:  10/15 Cardiomegaly, bilateral pulmonary edema continually improving  Abdominal US: 10/14 Gallbladder sludge with a small amount of pericholecystic fluid and minimal gallbladder wall thickening. Fatty infiltration of the liver with nodular borders.   ASSESSMENT / PLAN:  PULMONARY  A:  Acute respiratory failure  Pulmonary edema improving Bronchospasm - likely cardiac asthma -resolving P:  -extubated 10/12, doing well- intermittent 2L O2 nasal canula -pcxr resolving edema, crt rise, may hold or reduce lasix -pcxr to guide volume status in am   CARDIOVASCULAR  A: Severe cardiomyopathy  S/P mechanical aortic valve replacement, remote  AFRVR  L atrial appendage clot  Poor venous access - difficult L femoral CVL placed  P:  Mgmt per Cards  Cont anticoagulation once INR less 2.5, (2.74 10/15)                     -pending resolution hemoptysis  May d/c or decrease  lasix- managed per cards amio per cards, I have concern with continue this with LFt / lipase noted, await repeat  RENAL  A:  Mild ARI (baseline Cr 1.1)- improved Hypokalemia, 3.7 10/14, normalized Edema decreased pcxr 10/15 P:  kvo  Worse with lasix, consider dc Chem in am   GASTROINTESTINAL  A:  Thin Liquid Diet, tolerating well Hepatitis- Elevated Lipase, Amylase, AST, ALT, Lactic Acid 10/14 pancreatitis? Hep panel- neg Etiology unlcear: hypoperfusion? Med induced? My sense is this is a diffuse issue Also r/o congestive hepatopathy from 2 days prior P:  IV famotidine  Follow trend LFT  / lipase now and in am   Caution coumadin / amio / lasix (especially with pancreatic enzymes up) Dc statin, allopurinol May require CT abdo pelvis to re eval pancrease US reviewed  HEMATOLOGIC  A:  Chronic warfarin  Mild thrombocytopenia - predates admission  Bloody secretions resolving  P:  DVT px: full dose warfarin, per pharm, have some concerns with coumadin and LFT, may need to consider to hold this Monitor CBC intermittently  Caution coumadin dosing with elevated LFT   INFECTIOUS  A:  R/o PNA P:  unasyn till 16th  ENDOCRINE  A:  Mild stress induced hyperglycemia - improved, no  prior DM  P:  will monitor glucose with BMET, CBGs wnl SSI if glu > 180   NEUROLOGIC  A:  Agitation without delirium- resolving  P:  Avoid versed if able, lft noted On Seroquel and trazadone QHS  Today's Summary: Pt continues to be diuresed and maintains negative balance. LFT, lipase, amylase, lactic acid labs are elevated (10/14), Hepatitis panel negative, most likely drug induced. Caution use of Warfarin.   Will continue to monitor chest xray for improvement. Lasix ok to be d/c as creatinine levels are slowly rising. Will have Foley removed today, as pt is able to verbalize his need to urinate.   Cephus Shelling, PA-Student, [facility]  I have fully examined this patient and agree  with above findings.     Mcarthur Rossetti. Tyson Alias, MD, FACP Pgr: [phone] [facility]  Pulmonary and Critical Care Medicine [facility] Pager: [phone]  10/20/2012, 7:40 AM

## 2012-10-20 NOTE — Progress Notes (Signed)
Patient ID: Jon Butler, male   DOB: 05/10/1935, 77 y.o.   MRN: [id number] Subjective:  "I feel better", denies chest pain or sob.  Objective:  Vital Signs in the last 24 hours: Temp:  [97.5 F (36.4 C)-98.3 F (36.8 C)] 98.1 F (36.7 C) (10/15 0400) Pulse Rate:  [33-51] 51 (10/14 1620) Resp:  [18-24] 18 (10/15 0400) BP: (110-137)/(80-106) 117/82 mmHg (10/15 0400) SpO2:  [92 %-100 %] 97 % (10/15 0726) Weight:  [229 lb 0.9 oz (103.9 kg)] 229 lb 0.9 oz (103.9 kg) (10/15 0500)  Intake/Output from previous day: 10/14 0701 - 10/15 0700 In: 853.4 [P.O.:800; I.V.:53.4] Out: 1725 [Urine:1725] Intake/Output from this shift:    Physical Exam: Well appearing NAD HEENT: Unremarkable Neck:  7 cm JVD, no thyromegally Back:  No CVA tenderness Lungs:  Clear except for scattered rales HEART:  IRegular rate rhythm, no murmurs, no rubs, no clicks Abd:  soft, positive bowel sounds, no organomegally, no rebound, no guarding Ext:  2 plus pulses, no edema, no cyanosis, no clubbing Skin:  No rashes no nodules Neuro:  CN II through XII intact, motor grossly intact  Lab Results:  Recent Labs  10/19/12 0628 10/20/12 0415  WBC 8.0 8.6  HGB 12.6* 13.7  PLT 104* 122*    Recent Labs  10/19/12 0628 10/20/12 0415  NA 141 139  K 3.4* 3.7  CL 101 99  CO2 28 25  GLUCOSE 114* 95  BUN 40* 41*  CREATININE 1.13 1.12   No results found for this basename: TROPONINI, CK, MB,  in the last 72 hours Hepatic Function Panel  Recent Labs  10/19/12 0628  PROT 6.0  ALBUMIN 3.0*  AST 539*  ALT 384*  ALKPHOS 111  BILITOT 1.1   No results found for this basename: CHOL,  in the last 72 hours No results found for this basename: PROTIME,  in the last 72 hours  Imaging: US Abdomen Complete  10/19/2012   CLINICAL DATA:  Hepatitis. Elevated liver function tests, amylase and lipase.  EXAM: ULTRASOUND ABDOMEN COMPLETE  COMPARISON:  None.  FINDINGS: Gallbladder  Sludge is identified within the  gallbladder. Sonographer reports negative Murphy's sign. The gallbladder wall appears mildly thickened. No stones are identified. A tiny amount of fluid is present about the gallbladder and the gallbladder wall is mildly thickened.  Common bile duct  Diameter: 0.6 cm.  Liver  Liver echotexture is heterogeneous in increased. The liver border is nodular. No focal lesion or intrahepatic biliary ductal dilatation is identified. There is hepatopetal flow in the portal vein.  IVC  No abnormality visualized.  Pancreas  Visualized portion unremarkable.  Spleen  Size and appearance within normal limits.  Right Kidney  Length: 11.8 cm Echogenicity within normal limits. No mass or hydronephrosis visualized.  Left Kidney  Length: 11.7 cm Echogenicity within normal limits. No mass or hydronephrosis visualized.  Abdominal aorta  No aneurysm visualized.  IMPRESSION: Gallbladder sludge with a small amount of pericholecystic fluid. Minimal gallbladder wall thickening is likely related to fluid rather than cholecystitis.  Fatty infiltration of the liver. The liver border appears nodular suggesting cirrhosis.   Electronically Signed   By: Drusilla Kanner M.D.   On: 10/19/2012 19:11   Dg Chest Port 1 View  10/20/2012   CLINICAL DATA:  Short of breath. Pulmonary edema  EXAM: PORTABLE CHEST - 1 VIEW  COMPARISON:  10/19/2012  FINDINGS: Continued improvement in bilateral airspace disease most suggestive of pulmonary edema. Mild atelectasis in the lung  bases. No effusion.  IMPRESSION: Continued improvement in pulmonary edema.   Electronically Signed   By: Marlan Palau M.D.   On: 10/20/2012 07:11   Dg Chest Port 1 View  10/19/2012   CLINICAL DATA:  Bilateral pulmonary infiltrates. Shortness of breath.  EXAM: PORTABLE CHEST - 1 VIEW  COMPARISON:  10/18/2012  FINDINGS: Interval removal of right upper extremity PICC. No interval displacement of biventricular ICD/pacer wires. Stable cardiomegaly. Improved lung aeration with  diminishing bilateral airspace and interstitial opacities. No increasing pleural fluid. No pneumothorax.  IMPRESSION: Decreased pulmonary edema and improving lung aeration.   Electronically Signed   By: Tiburcio Pea M.D.   On: 10/19/2012 06:50    Cardiac Studies: Tele - atrial fib with a cvr/rvr Assessment/Plan:  1. Acute on chronic systolic CHF 2. Atrial fibrillation with a RVR 3. 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 Hepatitis, likely due to poor cardiac output around the time of his decompensation, with only sludge seen on ultrasound Rec: Continue IV lasix, will transfer to step down. XRAY improving with a reduction in the interstitial infiltrates, rate in atrial fib is still not ideal but improving. Continue amiodarone.  LOS: 5 days    Westlee Devita,M.D. 10/20/2012, 8:21 AM

## 2012-10-20 NOTE — Progress Notes (Signed)
ANTICOAGULATION CONSULT NOTE - Follow Up Consult  Pharmacy Consult for  Warfarin Indication: atrial fibrillation  No Known Allergies  Patient Measurements: Height: 5\' 10"  (177.8 cm) Weight: 229 lb 0.9 oz (103.9 kg) IBW/kg (Calculated) : 73  Vital Signs: Temp: 98 F (36.7 C) (10/15 0800) Temp src: Oral (10/15 0800) BP: 142/99 mmHg (10/15 0800) Pulse Rate: 105 (10/15 0800)  Labs:  Recent Labs  10/18/12 0500  10/18/12 1130 10/19/12 0628 10/20/12 0415  HGB  --   < > 11.6* 12.6* 13.7  HCT  --   --  35.3* 39.5 39.7  PLT  --   --  87* 104* 122*  LABPROT 33.3*  --   --  26.8* 28.1*  INR 3.43*  --   --  2.58* 2.74*  CREATININE 0.99  --   --  1.13 1.12  < > = values in this interval not displayed.  Estimated Creatinine Clearance: 68.8 ml/min (by C-G formula based on Cr of 1.12).   Medications:  Prescriptions prior to admission  Medication Sig Dispense Refill  . allopurinol (ZYLOPRIM) 300 MG tablet Take 300 mg by mouth daily.        Marland Kitchen amiodarone (PACERONE) 400 MG tablet Take 1 tablet (400 mg total) by mouth daily.  30 tablet  4  . amoxicillin (AMOXIL) 500 MG capsule Take 500 mg by mouth every other day.       . captopril (CAPOTEN) 50 MG tablet Take 50 mg by mouth 3 (three) times daily.        . carvedilol (COREG) 25 MG tablet Take 25 mg by mouth 2 (two) times daily with a meal.      . Coenzyme Q10 (CO Q-10 PO) Take by mouth daily.        . colchicine 0.6 MG tablet Take 0.6 mg by mouth as needed.       . furosemide (LASIX) 40 MG tablet Take 40 mg by mouth daily.        . memantine (NAMENDA) 5 MG tablet Take 5 mg by mouth daily.      . Memantine HCl ER (NAMENDA XR) 28 MG CP24 Take 28 mg by mouth daily.      . Multiple Vitamins-Minerals (MULTIVITAMIN WITH MINERALS) tablet Take 1 tablet by mouth daily.        . Red Yeast Rice Extract (RED YEAST RICE PO) Take 1 tablet by mouth daily.       . rosuvastatin (CRESTOR) 10 MG tablet Take 10 mg by mouth daily.        . traZODone  (DESYREL) 50 MG tablet Take 50 mg by mouth at bedtime.        Marland Kitchen warfarin (COUMADIN) 5 MG tablet Take 2.5-5 mg by mouth daily. 5mg  SuMoFrSa; 2.5mg  Th; take none TuThu.       77 y.o.m admitted 10/15/2012  with recent afib and worsening heart failure. He had 2 weeks of therapeutic anticoagulation PTA. Brought to hospital to undergo transesophageal echo guided cardioversion. A small clot was found in his left atrial appendage and cardioversion was postponed > subsequent severe respiratory distress, intubated.    Events: Now extubated, no Overnight events   AC: Afib, LA clot, mech valve [AVR, but INR goal 2-3]  Home Dose -spoke with family: coumadin in normally 5mg  SuMoFrSa; 2.5mg  Wed; skips TuThu (average TWD 3.2 mg). Dosing has been somewhat variable per family. INR elevated on admission, now 2.58 (after 2 mg yesterday--INR still trending down)  PLAN: Coumadin 4 mg  po x 1  ID: PNA; afeb wbc wnl  Unasyn (ends 10/16),  Blood NGTD   sputum with GNC, GPC, follow  CV: S-CHF, Bi-V ICD, VTach, atrial fibrillation , Cardiomyopathy, HLD - now with atrial clot - will need cardioversion after 4 weeks of therapeutic INR - (Statin, Lasix, Amiodarone). No longer on pressors - have not yet resumed Captopril/Carvedilol BP at godl, HR 100s  ENDO: Hx. Gout - takes Allopurinol No hx. DM: CBG 98-102  GI/NTRN: Dysphagia II diet  Renal: Normal rental fxn. With good UOP crcl ~ 65-70 ml/min  Neuro: Ongoing confusion - takes Namenda, Seroquel and Trazadone PTA  PULM: Extubated 10/12,  Hematology / Oncology: Stable PTA Medication Issues - Captopril, Carvedilol -BP just high enough 10/15 to resume, follow Best Practices : VTE prophylaxis   Goal of Therapy:  INR 2-3 Monitor platelets by anticoagulation protocol: Yes   Plan:  1. Warfarin 2 mg, on home amiodarone dose but concurrent abx and LFTs increased. 2. Follow INR.  Thank you for allowing pharmacy to be a part of this patients care team.  Lovenia Kim Pharm.D., BCPS Clinical Pharmacist 10/20/2012 9:58 AM Pager: [phone] Phone: [phone]

## 2012-10-21 ENCOUNTER — Encounter (HOSPITAL_COMMUNITY): Payer: Self-pay | Admitting: Anesthesiology

## 2012-10-21 ENCOUNTER — Inpatient Hospital Stay (HOSPITAL_COMMUNITY): Payer: Medicare Other

## 2012-10-21 DIAGNOSIS — N179 Acute kidney failure, unspecified: Secondary | ICD-10-CM

## 2012-10-21 DIAGNOSIS — I5023 Acute on chronic systolic (congestive) heart failure: Principal | ICD-10-CM

## 2012-10-21 LAB — COMPREHENSIVE METABOLIC PANEL
AST: 368 U/L — ABNORMAL HIGH (ref 0–37)
Alkaline Phosphatase: 155 U/L — ABNORMAL HIGH (ref 39–117)
BUN: 45 mg/dL — ABNORMAL HIGH (ref 6–23)
CO2: 30 mEq/L (ref 19–32)
Calcium: 9 mg/dL (ref 8.4–10.5)
Chloride: 96 mEq/L (ref 96–112)
Creatinine, Ser: 1.48 mg/dL — ABNORMAL HIGH (ref 0.50–1.35)
GFR calc Af Amer: 52 mL/min — ABNORMAL LOW (ref >90)
GFR calc non Af Amer: 45 mL/min — ABNORMAL LOW (ref >90)
Potassium: 3.8 mEq/L (ref 3.5–5.1)

## 2012-10-21 LAB — PROTIME-INR
INR: 3.21 — ABNORMAL HIGH (ref 0.00–1.49)
Prothrombin Time: 31.7 seconds — ABNORMAL HIGH (ref 11.6–15.2)

## 2012-10-21 MED ORDER — FUROSEMIDE 10 MG/ML IJ SOLN
80.0000 mg | Freq: Two times a day (BID) | INTRAMUSCULAR | Status: DC
  Administered 2012-10-21 – 2012-10-22 (×2): 80 mg via INTRAVENOUS
  Filled 2012-10-21 (×5): qty 8

## 2012-10-21 MED ORDER — SODIUM CHLORIDE 0.9 % IV SOLN
1.5000 g | Freq: Three times a day (TID) | INTRAVENOUS | Status: DC
  Administered 2012-10-21: 1.5 g via INTRAVENOUS
  Filled 2012-10-21 (×2): qty 1.5

## 2012-10-21 MED ORDER — PIPERACILLIN-TAZOBACTAM 3.375 G IVPB
3.3750 g | Freq: Three times a day (TID) | INTRAVENOUS | Status: DC
  Administered 2012-10-21 – 2012-10-22 (×2): 3.375 g via INTRAVENOUS
  Filled 2012-10-21 (×4): qty 50

## 2012-10-21 MED ORDER — MILRINONE IN DEXTROSE 20 MG/100ML IV SOLN
0.1250 ug/kg/min | INTRAVENOUS | Status: DC
  Administered 2012-10-21 – 2012-10-23 (×4): 0.25 ug/kg/min via INTRAVENOUS
  Administered 2012-10-24: 0.125 ug/kg/min via INTRAVENOUS
  Administered 2012-10-24: 0.25 ug/kg/min via INTRAVENOUS
  Filled 2012-10-21 (×7): qty 100

## 2012-10-21 NOTE — Progress Notes (Signed)
Transferred in from 2912 by chair, awake and alert.

## 2012-10-21 NOTE — Consult Note (Signed)
Entered in error

## 2012-10-21 NOTE — Care Management Note (Signed)
    Page 1 of 1   10/21/2012     3:58:18 PM   CARE MANAGEMENT NOTE 10/21/2012  Patient:  Jon Butler, Jon Butler   Account Number:  [id number]  Date Initiated:  10/18/2012  Documentation initiated by:  Junius Creamer  Subjective/Objective Assessment:   adm w resp failure     Action/Plan:   lives w wife, pcp dr Burney Gauze   DC Planning Services  CM consult      Choice offered to / List presented to:  spouse    HH arranged  HH-1 RN  HH-10 DISEASE MANAGEMENT      HH agency  [facility]   Per UR Regulation:  Reviewed for med. necessity/level of care/duration of stay  If discussed at Long Length of Stay Meetings, dates discussed:   10/21/2012   Comments:  Contact:  Jon Butler, spouse  [phone]                 Jon Butler, son  [phone]  10/21/12 1553 Mykal Kirchman RN MSN BSN CCM Received referral to arrange home health RN for congestive failure telemonitoring program.  Met with pt, spouse, and son @ bedside.  Explained home health services, pt agrees. Spouse states pt has h/o home health services with [facility] and requests referral to that agency.  Liaison notified.

## 2012-10-21 NOTE — Consult Note (Addendum)
Advanced Heart Failure Team Consult Note  Referring Physician:  Primary Physician: Primary Cardiologist:  Primary EP: Dr. Ladona Ridgel  Reason for Consultation: A/C HF  HPI:    Jon Butler is a 77 yo male with a history of chronic systolic HF EF 20% due to NICM s/p CRT-D (Medtr[employer]), mechanical AVR ([employer]), LBBB, PAF and non-compliance.   He recently developed Afib/flutter and was scheduled for TEE-DCCV on 09/20/12 at the same time when he had his CRT-D replacement for EOL, however his INR was 1.6 so cardioversion was postponed.   Over the past couple weeks the patient has had increased DOE and weight gain. He had 2 weeks of therapeutic INRs, when he was seen in the office and it was felt that TEE-DCCV needed to be sooner than later d/t increased HF symptoms and fear of INR becoming subtherapeutic. He was started on amiodarone and then brought to the hospital for procedure 10/15/12 but was found to have small clot in atrial appendage so did not proceed with procedure. During the TEE he developed respiratory difficulty and was intubated. BB and ACE-I held and amiodarone gtt was started.  He has subsequently been extubated and now on PO amiodarone and rate is relatively controlled 90-115. However weight is up to 231 which is about 20-25 pounds above his baseline weight. Attempts at diuresis have been thwarted due to worsening renal function with Cr 1.1->1.5.  He is very edematous. Dyspneic on mild activity.   According to his son, Mr. Florek has been back to drinking more ETOH lately at home and may be part of the reason for his noncompliance.    Review of Systems: [y] = yes, [ ]  = no   General: Weight gain [Y ]; Weight loss [ ] ; Anorexia [ ] ; Fatigue [ y]; Fever [ ] ; Chills [ ] ; Weakness [ Y]  Cardiac: Chest pain/pressure [ ] ; Resting SOB [ ] ; Exertional SOB [ Y]; Orthopnea [ Y]; Pedal Edema [Y ]; Palpitations [Y ]; Syncope [ ] ; Presyncope [ ] ; Paroxysmal nocturnal dyspnea[ ]   Pulmonary: Cough [  ]; Wheezing[ ] ; Hemoptysis[ ] ; Sputum [ ] ; Snoring [ ]   GI: Vomiting[ ] ; Dysphagia[ ] ; Melena[ ] ; Hematochezia [ ] ; Heartburn[ ] ; Abdominal pain [ ] ; Constipation [ ] ; Diarrhea [ ] ; BRBPR [ ]   GU: Hematuria[ ] ; Dysuria [ ] ; Nocturia[ ]   Vascular: Pain in legs with walking [ ] ; Pain in feet with lying flat [ ] ; Non-healing sores [ ] ; Stroke [ ] ; TIA [ ] ; Slurred speech [ ] ;  Neuro: Headaches[ ] ; Vertigo[ ] ; Seizures[ ] ; Paresthesias[ ] ;Blurred vision [ ] ; Diplopia [ ] ; Vision changes [ ]   Ortho/Skin: Arthritis [ ] ; Joint pain [ ] ; Muscle pain [ ] ; Joint swelling [ ] ; Back Pain [ ] ; Rash [ ]   Psych: Depression[ ] ; Anxiety[ ]   Heme: Bleeding problems [ ] ; Clotting disorders [ ] ; Anemia [ ]   Endocrine: Diabetes [ ] ; Thyroid dysfunction[ ]   Home Medications Prior to Admission medications   Medication Sig Start Date End Date Taking? Authorizing Provider  allopurinol (ZYLOPRIM) 300 MG tablet Take 300 mg by mouth daily.     Yes Historical Provider, MD  amiodarone (PACERONE) 400 MG tablet Take 1 tablet (400 mg total) by mouth daily. 10/13/12  Yes Marinus Maw, MD  amoxicillin (AMOXIL) 500 MG capsule Take 500 mg by mouth every other day.    Yes Historical Provider, MD  captopril (CAPOTEN) 50 MG tablet Take 50 mg by mouth 3 (three) times daily.  Yes Historical Provider, MD  carvedilol (COREG) 25 MG tablet Take 25 mg by mouth 2 (two) times daily with a meal.   Yes Historical Provider, MD  Coenzyme Q10 (CO Q-10 PO) Take by mouth daily.     Yes Historical Provider, MD  colchicine 0.6 MG tablet Take 0.6 mg by mouth as needed.    Yes Historical Provider, MD  furosemide (LASIX) 40 MG tablet Take 40 mg by mouth daily.     Yes Historical Provider, MD  memantine (NAMENDA) 5 MG tablet Take 5 mg by mouth daily.   Yes Historical Provider, MD  Memantine HCl ER (NAMENDA XR) 28 MG CP24 Take 28 mg by mouth daily.   Yes Historical Provider, MD  Multiple Vitamins-Minerals (MULTIVITAMIN WITH MINERALS) tablet Take 1  tablet by mouth daily.     Yes Historical Provider, MD  Red Yeast Rice Extract (RED YEAST RICE PO) Take 1 tablet by mouth daily.    Yes Historical Provider, MD  rosuvastatin (CRESTOR) 10 MG tablet Take 10 mg by mouth daily.     Yes Historical Provider, MD  traZODone (DESYREL) 50 MG tablet Take 50 mg by mouth at bedtime.     Yes Historical Provider, MD  warfarin (COUMADIN) 5 MG tablet Take 2.5-5 mg by mouth daily. 5mg  SuMoFrSa; 2.5mg  Th; take none TuThu.   Yes Historical Provider, MD    Past Medical History: Past Medical History  Diagnosis Date  . Left bundle-branch block   . Cardiomyopathy   . Chronic systolic heart failure     Past Surgical History: Past Surgical History  Procedure Laterality Date  . Doppler echocardiography  2008, 2009  . Aortic valve replacement    . Tee without cardioversion N/A 10/15/2012    Procedure: TRANSESOPHAGEAL ECHOCARDIOGRAM (TEE);  Surgeon: Pricilla Riffle, MD;  Location: [facility] ENDOSCOPY;  Service: Cardiovascular;  Laterality: N/A;    Family History: Family History  Problem Relation Age of Onset  . Aneurysm Mother     brain  . Heart attack Father 72    Social History: History   Social History  . Marital Status: Married    Spouse Name: N/A    Number of Children: N/A  . Years of Education: N/A   Social History Main Topics  . Smoking status: Never Smoker   . Smokeless tobacco: None  . Alcohol Use: 0.0 oz/week    2-3 Glasses of wine per week     Comment: 2-3 glasses  . Drug Use: No  . Sexual Activity: None   Other Topics Concern  . None   Social History Narrative  . None    Allergies:  No Known Allergies  Objective:    Vital Signs:   Temp:  [96 F (35.6 C)-98.4 F (36.9 C)] 97.8 F (36.6 C) (10/16 1224) Pulse Rate:  [104-112] 112 (10/16 1224) Resp:  [15-27] 20 (10/16 1453) BP: (108-143)/(54-111) 119/75 mmHg (10/16 1600) SpO2:  [95 %-98 %] 95 % (10/16 1224) Weight:  [104.8 kg (231 lb 0.7 oz)] 104.8 kg (231 lb 0.7 oz) (10/16  0500) Last BM Date: 10/21/12  Weight change: Filed Weights   10/19/12 0410 10/20/12 0500 10/21/12 0500  Weight: 104.8 kg (231 lb 0.7 oz) 103.9 kg (229 lb 0.9 oz) 104.8 kg (231 lb 0.7 oz)    Intake/Output:   Intake/Output Summary (Last 24 hours) at 10/21/12 1747 Last data filed at 10/21/12 1600  Gross per 24 hour  Intake  793.7 ml  Output    525 ml  Net  268.7 ml     Physical Exam: General:  Chronically ill appearing. No resp difficulty HEENT: normal Neck: supple. JVP to ear . Carotids 2+ bilat; no bruits. No lymphadenopathy or thryomegaly appreciated. Cor: PMI nondisplaced. irregular rate & rhythm. No rubs, gallops. Mechanical s2. 2/6 MR Lungs: clear Abdomen: soft, nontender, + distended. No hepatosplenomegaly. No bruits or masses. Good bowel sounds. Extremities: no cyanosis, clubbing, rash, 2-3 + bilateral edema Neuro: alert & orientedx3, cranial nerves grossly intact. moves all 4 extremities w/o difficulty. Affect pleasant  Telemetry: Afib 100s  Labs: Basic Metabolic Panel:  Recent Labs Lab 10/17/12 1619 10/18/12 0500 10/19/12 0628 10/20/12 0415 10/21/12 0519  NA 144 145 141 139 138  K 3.8 3.4* 3.4* 3.7 3.8  CL 104 104 101 99 96  CO2 30 30 28 25 30   GLUCOSE 151* 114* 114* 95 117*  BUN 45* 41* 40* 41* 45*  CREATININE 1.07 0.99 1.13 1.12 1.48*  CALCIUM 8.2* 8.0* 8.3* 8.4 9.0  MG  --  2.1  --   --   --   PHOS  --  3.3  --   --   --     Liver Function Tests:  Recent Labs Lab 10/17/12 0430 10/19/12 0628 10/20/12 0415 10/21/12 0519  AST 18 539* 344* 368*  ALT 29 384* 398* 473*  ALKPHOS 46 111 123* 155*  BILITOT 1.0 1.1 1.4* 1.8*  PROT 5.5* 6.0 5.9* 6.4  ALBUMIN 2.8* 3.0* 3.0* 3.4*    Recent Labs Lab 10/19/12 1257 10/20/12 0415  LIPASE 1057* 836*  AMYLASE 566* 488*   No results found for this basename: AMMONIA,  in the last 168 hours  CBC:  Recent Labs Lab 10/15/12 1310 10/16/12 0500 10/17/12 0430 10/18/12 1130 10/19/12 0628  10/20/12 0415  WBC 7.3 13.0* 10.0 8.3 8.0 8.6  NEUTROABS 5.9  --   --  6.3 5.6 5.5  HGB 14.3 13.9 12.2* 11.6* 12.6* 13.7  HCT 42.2 40.9 36.2* 35.3* 39.5 39.7  MCV 98.1 97.4 96.8 98.6 98.5 95.9  PLT 117* 121* 94* 87* 104* 122*    Cardiac Enzymes: No results found for this basename: CKTOTAL, CKMB, CKMBINDEX, TROPONINI,  in the last 168 hours  BNP: BNP (last 3 results)  Recent Labs  10/15/12 1312  PROBNP 7041.0*    CBG:  Recent Labs Lab 10/18/12 1622 10/18/12 1954 10/18/12 2356 10/19/12 0402 10/19/12 0733  GLUCAP 113* 134* 101* 110* 105*    Coagulation Studies:  Recent Labs  10/19/12 0628 10/20/12 0415 10/21/12 0519  LABPROT 26.8* 28.1* 31.7*  INR 2.58* 2.74* 3.21*    Imaging: Ct Abdomen Pelvis Wo Contrast  10/20/2012   CLINICAL DATA:  Hepatitis. Elevated amylase and lipase. Assess for pancreatitis.  EXAM: CT ABDOMEN AND PELVIS WITHOUT CONTRAST  TECHNIQUE: Multidetector CT imaging of the abdomen and pelvis was performed following the standard protocol without intravenous contrast.  COMPARISON:  Ultrasound 10/19/2012  FINDINGS: Small bilateral pleural effusions. Atelectasis or infiltrates in the lower lobes bilaterally. Heart is mildly enlarged. Pacer wires noted in the right heart. Prior aortic valve replacement.  Liver, spleen, pancreas, adrenals and kidneys have an unremarkable unenhanced appearance. Gallbladder grossly unremarkable. There is a small amount of perihepatic and perisplenic ascites. Small amount of ascites in the paracolic gutters.  No CT evidence of pancreatitis.  Small diverticulum off the 4th portion of the duodenum. Small bowel is decompressed. Diffuse colonic diverticulosis. No active diverticulitis. Appendix is visualized and is normal.  Urinary bladder is grossly unremarkable.  Small amount of air within the urinary bladder, presumably related to recent catheterization. Aorta and iliac vessels are calcified, non aneurysmal. No free air or adenopathy.   No acute bony abnormality.  IMPRESSION: Small bilateral pleural effusions. Small amount of abdominal and pelvic ascites.  No CT changes of pancreatitis.  Diffuse colonic diverticulosis.   Electronically Signed   By: Charlett Nose M.D.   On: 10/20/2012 14:29   US Abdomen Complete  10/19/2012   CLINICAL DATA:  Hepatitis. Elevated liver function tests, amylase and lipase.  EXAM: ULTRASOUND ABDOMEN COMPLETE  COMPARISON:  None.  FINDINGS: Gallbladder  Sludge is identified within the gallbladder. Sonographer reports negative Murphy's sign. The gallbladder wall appears mildly thickened. No stones are identified. A tiny amount of fluid is present about the gallbladder and the gallbladder wall is mildly thickened.  Common bile duct  Diameter: 0.6 cm.  Liver  Liver echotexture is heterogeneous in increased. The liver border is nodular. No focal lesion or intrahepatic biliary ductal dilatation is identified. There is hepatopetal flow in the portal vein.  IVC  No abnormality visualized.  Pancreas  Visualized portion unremarkable.  Spleen  Size and appearance within normal limits.  Right Kidney  Length: 11.8 cm Echogenicity within normal limits. No mass or hydronephrosis visualized.  Left Kidney  Length: 11.7 cm Echogenicity within normal limits. No mass or hydronephrosis visualized.  Abdominal aorta  No aneurysm visualized.  IMPRESSION: Gallbladder sludge with a small amount of pericholecystic fluid. Minimal gallbladder wall thickening is likely related to fluid rather than cholecystitis.  Fatty infiltration of the liver. The liver border appears nodular suggesting cirrhosis.   Electronically Signed   By: Drusilla Kanner M.D.   On: 10/19/2012 19:11   Dg Chest Port 1 View  10/21/2012   CLINICAL DATA:  Shortness breath.  EXAM: PORTABLE CHEST - 1 VIEW  COMPARISON:  None.  FINDINGS: Biventricular AICD in place entering from the left with leads unchanged in position.  Cardiomegaly.  No gross pneumothorax.  Asymmetric  airspace disease most notable centrally and involving the lung bases. This may represent pulmonary edema although infectious infiltrate cannot be excluded in the proper clinical setting.  Calcified tortuous aorta.  Elevated right hemidiaphragm.  Haziness left lung base and therefore pleural effusion not excluded.  IMPRESSION: No significant change in asymmetric airspace disease as detailed above.   Electronically Signed   By: Bridgett Larsson M.D.   On: 10/21/2012 08:10   Dg Chest Port 1 View  10/20/2012   CLINICAL DATA:  Short of breath. Pulmonary edema  EXAM: PORTABLE CHEST - 1 VIEW  COMPARISON:  10/19/2012  FINDINGS: Continued improvement in bilateral airspace disease most suggestive of pulmonary edema. Mild atelectasis in the lung bases. No effusion.  IMPRESSION: Continued improvement in pulmonary edema.   Electronically Signed   By: Marlan Palau M.D.   On: 10/20/2012 07:11     Medications:     Current Medications: . amiodarone  400 mg Oral Daily  . ampicillin-sulbactam (UNASYN) IV  1.5 g Intravenous Q8H  . famotidine  20 mg Oral BID  . furosemide  80 mg Intravenous BID  . levalbuterol  0.63 mg Nebulization BID  . multivitamin with minerals  1 tablet Oral Daily  . QUEtiapine  50 mg Oral QHS  . traZODone  50 mg Oral QHS  . Warfarin - Pharmacist Dosing Inpatient   Does not apply q1800    Infusions: . milrinone 0.25 mcg/kg/min (10/21/12 1449)     Assessment:  1) A/C systolic HF 2) NICM, s/p CRT-D ([employer]tr[employer]) 3) Atrial fibrillation, RVR 4) Anticoagulation 5) Respiratory failure 6) Left atrial appendage thrombus 7) Hypokalemia 8) A/C kidney disease, stage 3, likely due to cardiorenal syndrome 9) Elevated AST/ALT  Plan/Discussion:    Mr. Moffatt currently has marked volume overload in the setting of chronic severe HF and recurrent AF. Unfortunately, he is unable to be cardioverted due to LAA thrombus. Situation now c/b cardiorenal syndrome suspicious for low output physiology.  Will start milrinone (watching closely for increased ectopy) and resume lasix 80mg  IV bid. Place TED hose. Agree with need for DC-CV when felt safe. May benefit from digoxin for help with + inotropy and rate control though I am a bit reluctant given renal failure and current treatment with amiodarone. Will hold off for now.   If CCM feels he still needs abx would switch unasyn to zosyn as it is lower salt load.   The HF team will follow closely. I d/w his son by phone.  Length of Stay: 6  Arvilla Meres 10/21/2012, 5:47 PM  Advanced Heart Failure Team Pager [phone] (M-F; 7a - 4p)  Please contact [facility] for night-coverage after hours (4p -7a ) and weekends on [email or web address]

## 2012-10-21 NOTE — Progress Notes (Signed)
PULMONARY  / CRITICAL CARE MEDICINE  Name: Jon Butler MRN: [id number] DOB: 12-Nov-1935    ADMISSION DATE:  10/15/2012 CONSULTATION DATE:  10/15/12  REFERRING MD :  Sharrell Ku PRIMARY SERVICE:  Cardiology  CHIEF COMPLAINT:  Acute Respiratory Failure  BRIEF PATIENT DESCRIPTION:  57 M with NICM (EF 25%), recent onset AF. Admitted 10/10 for planned TEE cardioversion. Atrial appendage thrombus found on TEE and cardioversion was not performed. Developed severe respiratory distress with pulm edema pattern on CXR and wheezing. Transferred emergently to ICU and PCCM consulted.   SIGNIFICANT EVENTS / STUDIES:  10/10 Admitted for elective cardioversion  10/10 TEE: atrial appendage clot. No cardioversion performed  10/10 severe respiratory distress. Transfer to CCU emergently. Inadequately supported on NPPV. Intubated 10/12 extubated 10/15 ct abdo>>>Small bilateral pleural effusions. Small amount of abdominal and pelvic ascites. No CT changes of pancreatitis.   LINES / TUBES: ETT 10/10 >> 10/12 L femoral CVL 10/10 >> 10/13 PICC (ordered 10/10) >> 10/14 (pt pulled out) PIV x 2 10/14  CULTURES: MRSA by PCR 10/10 >>> negative Resp Cx 10/10 >>> NF Blood Cx 10/11 >>>   ANTIBIOTICS: Unasyn 10/11 >>>10/16  SUBJECTIVE:  Evan balance goals, progressing VITAL SIGNS: Temp:  [96 F (35.6 C)-98.4 F (36.9 C)] 98.4 F (36.9 C) (10/16 0800) Pulse Rate:  [104-109] 109 (10/16 0729) Resp:  [15-27] 23 (10/16 0729) BP: (114-137)/(75-111) 137/111 mmHg (10/16 0337) SpO2:  [95 %-98 %] 97 % (10/16 0800) Weight:  [104.8 kg (231 lb 0.7 oz)] 104.8 kg (231 lb 0.7 oz) (10/16 0500)  PHYSICAL EXAMINATION: General:no distress Neuro:  A&O x 3 HEENT:  Normocephalic, atraumatic, MMM, no scleral icterus Cardiovascular:  Irregular rhythm, prominent S2 Lungs:  Coarse but improved Abdomen:  Soft, NT/ND, +BS Musculoskeletal:  2+ bilateral UE and LE strength Skin:  Warm and dry, mild LE edema  left>right   Recent Labs Lab 10/19/12 0628 10/20/12 0415 10/21/12 0519  NA 141 139 138  K 3.4* 3.7 3.8  CL 101 99 96  CO2 28 25 30   BUN 40* 41* 45*  CREATININE 1.13 1.12 1.48*  GLUCOSE 114* 95 117*    Recent Labs Lab 10/18/12 1130 10/19/12 0628 10/20/12 0415  HGB 11.6* 12.6* 13.7  HCT 35.3* 39.5 39.7  WBC 8.3 8.0 8.6  PLT 87* 104* 122*   Ct Abdomen Pelvis Wo Contrast  10/20/2012   CLINICAL DATA:  Hepatitis. Elevated amylase and lipase. Assess for pancreatitis.  EXAM: CT ABDOMEN AND PELVIS WITHOUT CONTRAST  TECHNIQUE: Multidetector CT imaging of the abdomen and pelvis was performed following the standard protocol without intravenous contrast.  COMPARISON:  Ultrasound 10/19/2012  FINDINGS: Small bilateral pleural effusions. Atelectasis or infiltrates in the lower lobes bilaterally. Heart is mildly enlarged. Pacer wires noted in the right heart. Prior aortic valve replacement.  Liver, spleen, pancreas, adrenals and kidneys have an unremarkable unenhanced appearance. Gallbladder grossly unremarkable. There is a small amount of perihepatic and perisplenic ascites. Small amount of ascites in the paracolic gutters.  No CT evidence of pancreatitis.  Small diverticulum off the 4th portion of the duodenum. Small bowel is decompressed. Diffuse colonic diverticulosis. No active diverticulitis. Appendix is visualized and is normal.  Urinary bladder is grossly unremarkable. Small amount of air within the urinary bladder, presumably related to recent catheterization. Aorta and iliac vessels are calcified, non aneurysmal. No free air or adenopathy.  No acute bony abnormality.  IMPRESSION: Small bilateral pleural effusions. Small amount of abdominal and pelvic ascites.  No CT changes of  pancreatitis.  Diffuse colonic diverticulosis.   Electronically Signed   By: Charlett Nose M.D.   On: 10/20/2012 14:29   US Abdomen Complete  10/19/2012   CLINICAL DATA:  Hepatitis. Elevated liver function tests,  amylase and lipase.  EXAM: ULTRASOUND ABDOMEN COMPLETE  COMPARISON:  None.  FINDINGS: Gallbladder  Sludge is identified within the gallbladder. Sonographer reports negative Murphy's sign. The gallbladder wall appears mildly thickened. No stones are identified. A tiny amount of fluid is present about the gallbladder and the gallbladder wall is mildly thickened.  Common bile duct  Diameter: 0.6 cm.  Liver  Liver echotexture is heterogeneous in increased. The liver border is nodular. No focal lesion or intrahepatic biliary ductal dilatation is identified. There is hepatopetal flow in the portal vein.  IVC  No abnormality visualized.  Pancreas  Visualized portion unremarkable.  Spleen  Size and appearance within normal limits.  Right Kidney  Length: 11.8 cm Echogenicity within normal limits. No mass or hydronephrosis visualized.  Left Kidney  Length: 11.7 cm Echogenicity within normal limits. No mass or hydronephrosis visualized.  Abdominal aorta  No aneurysm visualized.  IMPRESSION: Gallbladder sludge with a small amount of pericholecystic fluid. Minimal gallbladder wall thickening is likely related to fluid rather than cholecystitis.  Fatty infiltration of the liver. The liver border appears nodular suggesting cirrhosis.   Electronically Signed   By: Drusilla Kanner M.D.   On: 10/19/2012 19:11   Dg Chest Port 1 View  10/21/2012   CLINICAL DATA:  Shortness breath.  EXAM: PORTABLE CHEST - 1 VIEW  COMPARISON:  None.  FINDINGS: Biventricular AICD in place entering from the left with leads unchanged in position.  Cardiomegaly.  No gross pneumothorax.  Asymmetric airspace disease most notable centrally and involving the lung bases. This may represent pulmonary edema although infectious infiltrate cannot be excluded in the proper clinical setting.  Calcified tortuous aorta.  Elevated right hemidiaphragm.  Haziness left lung base and therefore pleural effusion not excluded.  IMPRESSION: No significant change in  asymmetric airspace disease as detailed above.   Electronically Signed   By: Bridgett Larsson M.D.   On: 10/21/2012 08:10   Dg Chest Port 1 View  10/20/2012   CLINICAL DATA:  Short of breath. Pulmonary edema  EXAM: PORTABLE CHEST - 1 VIEW  COMPARISON:  10/19/2012  FINDINGS: Continued improvement in bilateral airspace disease most suggestive of pulmonary edema. Mild atelectasis in the lung bases. No effusion.  IMPRESSION: Continued improvement in pulmonary edema.   Electronically Signed   By: Marlan Palau M.D.   On: 10/20/2012 07:11   Chest Xray:  Diffuse left haziness, int changes improved  Abdominal US: 10/14 Gallbladder sludge with a small amount of pericholecystic fluid and minimal gallbladder wall thickening. Fatty infiltration of the liver with nodular borders.   ASSESSMENT / PLAN:  PULMONARY  A:  Acute respiratory failure  Pulmonary edema improving Bronchospasm - likely cardiac asthma -resolving P:  -extubated 10/12, doing well -follow for hemoptysis as INR rising  CARDIOVASCULAR  A: Severe cardiomyopathy  S/P mechanical aortic valve replacement, remote  AFRVR  L atrial appendage clot  Poor venous access - difficult L femoral CVL placed  P:  Mgmt per Cards   RENAL  A:  Mild ARI (baseline Cr 1.1)- improved Hypokalemia, 3.7 10/14, normalized Edema decreased pcxr 10/15 P:  Limit lasix, crt rising still  GASTROINTESTINAL  A:  Thin Liquid Diet, tolerating well Hepatitis- Elevated Lipase, Amylase, AST, ALT, Lactic Acid 10/14 pancreatitis? Hep panel-  neg Etiology unlcear: hypoperfusion? Med induced?  P:  IV famotidine  CT reviewed, likely was from low flow state however ast. Alt stagnent today May have medication contribution Caution amio, coumadin, bili rising Would follow lft closely  If all hypoperfusion would expect faster resolution lft  HEMATOLOGIC  A:  Chronic warfarin  Mild thrombocytopenia - predates admission  Bloody secretions resolving  P:  inr  noted Monitor CBC intermittently  Caution coumadin dosing with elevated LFT   INFECTIOUS  A:  R/o PNA P:  unasyn till 16th then dc Allow to dc today  ENDOCRINE  A:  Mild stress induced hyperglycemia - improved, no prior DM  P:  will monitor glucose with BMET, CBGs wnl SSI if glu > 180    Today's Summary: LFT stagnant, bili needs to be watched, caution amio, coumadin  Will sign off , abx stop date inplace  I have fully examined this patient and agree with above findings.     Mcarthur Rossetti. Tyson Alias, MD, FACP Pgr: [phone] [facility]  Pulmonary and Critical Care Medicine [facility] Pager: [phone]  10/21/2012, 9:21 AM

## 2012-10-21 NOTE — Progress Notes (Signed)
Patient ID: Jon Butler, male   DOB: Mar 11, 1935, 77 y.o.   MRN: [id number] Subjective:  Dyspnea about the same.   Objective:  Vital Signs in the last 24 hours: Temp:  [96 F (35.6 C)-98.4 F (36.9 C)] 98.4 F (36.9 C) (10/16 0800) Pulse Rate:  [104-109] 109 (10/16 0729) Resp:  [15-27] 23 (10/16 0729) BP: (114-137)/(75-111) 137/111 mmHg (10/16 0337) SpO2:  [95 %-98 %] 97 % (10/16 0800) Weight:  [231 lb 0.7 oz (104.8 kg)] 231 lb 0.7 oz (104.8 kg) (10/16 0500)  Intake/Output from previous day: 10/15 0701 - 10/16 0700 In: 960 [P.O.:960] Out: 975 [Urine:975] Intake/Output from this shift: Total I/O In: 240 [P.O.:240] Out: -   Physical Exam: stable appearing NAD HEENT: Unremarkable Neck:  7 cm JVD, no thyromegally Back:  No CVA tenderness Lungs:  Scattered rales HEART:  IRegular rate rhythm, no murmurs, no rubs, no clicks Abd:  soft, positive bowel sounds, no organomegally, no rebound, no guarding Ext:  2 plus pulses, no edema, no cyanosis, no clubbing Skin:  No rashes no nodules Neuro:  CN II through XII intact, motor grossly intact  Lab Results:  Recent Labs  10/19/12 0628 10/20/12 0415  WBC 8.0 8.6  HGB 12.6* 13.7  PLT 104* 122*    Recent Labs  10/20/12 0415 10/21/12 0519  NA 139 138  K 3.7 3.8  CL 99 96  CO2 25 30  GLUCOSE 95 117*  BUN 41* 45*  CREATININE 1.12 1.48*   No results found for this basename: TROPONINI, CK, MB,  in the last 72 hours Hepatic Function Panel  Recent Labs  10/20/12 0415 10/21/12 0519  PROT 5.9* 6.4  ALBUMIN 3.0* 3.4*  AST 344* 368*  ALT 398* 473*  ALKPHOS 123* 155*  BILITOT 1.4* 1.8*  BILIDIR 0.3  --   IBILI 1.1*  --    No results found for this basename: CHOL,  in the last 72 hours No results found for this basename: PROTIME,  in the last 72 hours  Imaging: Ct Abdomen Pelvis Wo Contrast  10/20/2012   CLINICAL DATA:  Hepatitis. Elevated amylase and lipase. Assess for pancreatitis.  EXAM: CT ABDOMEN AND PELVIS  WITHOUT CONTRAST  TECHNIQUE: Multidetector CT imaging of the abdomen and pelvis was performed following the standard protocol without intravenous contrast.  COMPARISON:  Ultrasound 10/19/2012  FINDINGS: Small bilateral pleural effusions. Atelectasis or infiltrates in the lower lobes bilaterally. Heart is mildly enlarged. Pacer wires noted in the right heart. Prior aortic valve replacement.  Liver, spleen, pancreas, adrenals and kidneys have an unremarkable unenhanced appearance. Gallbladder grossly unremarkable. There is a small amount of perihepatic and perisplenic ascites. Small amount of ascites in the paracolic gutters.  No CT evidence of pancreatitis.  Small diverticulum off the 4th portion of the duodenum. Small bowel is decompressed. Diffuse colonic diverticulosis. No active diverticulitis. Appendix is visualized and is normal.  Urinary bladder is grossly unremarkable. Small amount of air within the urinary bladder, presumably related to recent catheterization. Aorta and iliac vessels are calcified, non aneurysmal. No free air or adenopathy.  No acute bony abnormality.  IMPRESSION: Small bilateral pleural effusions. Small amount of abdominal and pelvic ascites.  No CT changes of pancreatitis.  Diffuse colonic diverticulosis.   Electronically Signed   By: Charlett Nose M.D.   On: 10/20/2012 14:29   US Abdomen Complete  10/19/2012   CLINICAL DATA:  Hepatitis. Elevated liver function tests, amylase and lipase.  EXAM: ULTRASOUND ABDOMEN COMPLETE  COMPARISON:  None.  FINDINGS: Gallbladder  Sludge is identified within the gallbladder. Sonographer reports negative Murphy's sign. The gallbladder wall appears mildly thickened. No stones are identified. A tiny amount of fluid is present about the gallbladder and the gallbladder wall is mildly thickened.  Common bile duct  Diameter: 0.6 cm.  Liver  Liver echotexture is heterogeneous in increased. The liver border is nodular. No focal lesion or intrahepatic biliary  ductal dilatation is identified. There is hepatopetal flow in the portal vein.  IVC  No abnormality visualized.  Pancreas  Visualized portion unremarkable.  Spleen  Size and appearance within normal limits.  Right Kidney  Length: 11.8 cm Echogenicity within normal limits. No mass or hydronephrosis visualized.  Left Kidney  Length: 11.7 cm Echogenicity within normal limits. No mass or hydronephrosis visualized.  Abdominal aorta  No aneurysm visualized.  IMPRESSION: Gallbladder sludge with a small amount of pericholecystic fluid. Minimal gallbladder wall thickening is likely related to fluid rather than cholecystitis.  Fatty infiltration of the liver. The liver border appears nodular suggesting cirrhosis.   Electronically Signed   By: Drusilla Kanner M.D.   On: 10/19/2012 19:11   Dg Chest Port 1 View  10/21/2012   CLINICAL DATA:  Shortness breath.  EXAM: PORTABLE CHEST - 1 VIEW  COMPARISON:  None.  FINDINGS: Biventricular AICD in place entering from the left with leads unchanged in position.  Cardiomegaly.  No gross pneumothorax.  Asymmetric airspace disease most notable centrally and involving the lung bases. This may represent pulmonary edema although infectious infiltrate cannot be excluded in the proper clinical setting.  Calcified tortuous aorta.  Elevated right hemidiaphragm.  Haziness left lung base and therefore pleural effusion not excluded.  IMPRESSION: No significant change in asymmetric airspace disease as detailed above.   Electronically Signed   By: Bridgett Larsson M.D.   On: 10/21/2012 08:10   Dg Chest Port 1 View  10/20/2012   CLINICAL DATA:  Short of breath. Pulmonary edema  EXAM: PORTABLE CHEST - 1 VIEW  COMPARISON:  10/19/2012  FINDINGS: Continued improvement in bilateral airspace disease most suggestive of pulmonary edema. Mild atelectasis in the lung bases. No effusion.  IMPRESSION: Continued improvement in pulmonary edema.   Electronically Signed   By: Marlan Palau M.D.   On: 10/20/2012  07:11    Cardiac Studies: Tele - atrial fibrillation with a CVR/RVR CXR - improved. Assessment/Plan:  1. Acute on chronic systolic CHF 2. Atrial fibrillation with an RVR 3. 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 Hepatitis 5. Worsening renal function Rec: we will hold his lasix today as kidney function worsened after 3 days of high dose lasix. I would be very surprised if amiodarone which has been given only 7 days is the etiology of his hepatitis. Will need to check serologies. Because of the difficulty with diuresis, I have asked our CHF service to help me with his management. He will be 4 weeks out with therapeutic INR's next week and I would anticipate DCCV at that time. Will progress activity as tolerated.  LOS: 6 days    Tiersa Dayley,M.D. 10/21/2012, 9:34 AM

## 2012-10-21 NOTE — Progress Notes (Signed)
ANTICOAGULATION CONSULT NOTE - Follow Up Consult  Pharmacy Consult for  Warfarin Indication: atrial fibrillation  No Known Allergies  Patient Measurements: Height: 5\' 10"  (177.8 cm) Weight: 231 lb 0.7 oz (104.8 kg) IBW/kg (Calculated) : 73  Vital Signs: Temp: 98.4 F (36.9 C) (10/16 0800) Temp src: Oral (10/16 0800) BP: 127/91 mmHg (10/16 0939) Pulse Rate: 109 (10/16 0729)  Labs:  Recent Labs  10/18/12 1130 10/19/12 0628 10/20/12 0415 10/21/12 0519  HGB 11.6* 12.6* 13.7  --   HCT 35.3* 39.5 39.7  --   PLT 87* 104* 122*  --   LABPROT  --  26.8* 28.1* 31.7*  INR  --  2.58* 2.74* 3.21*  CREATININE  --  1.13 1.12 1.48*    Estimated Creatinine Clearance: 52.3 ml/min (by C-G formula based on Cr of 1.48).   Medications:  Prescriptions prior to admission  Medication Sig Dispense Refill  . allopurinol (ZYLOPRIM) 300 MG tablet Take 300 mg by mouth daily.        Marland Kitchen amiodarone (PACERONE) 400 MG tablet Take 1 tablet (400 mg total) by mouth daily.  30 tablet  4  . amoxicillin (AMOXIL) 500 MG capsule Take 500 mg by mouth every other day.       . captopril (CAPOTEN) 50 MG tablet Take 50 mg by mouth 3 (three) times daily.        . carvedilol (COREG) 25 MG tablet Take 25 mg by mouth 2 (two) times daily with a meal.      . Coenzyme Q10 (CO Q-10 PO) Take by mouth daily.        . colchicine 0.6 MG tablet Take 0.6 mg by mouth as needed.       . furosemide (LASIX) 40 MG tablet Take 40 mg by mouth daily.        . memantine (NAMENDA) 5 MG tablet Take 5 mg by mouth daily.      . Memantine HCl ER (NAMENDA XR) 28 MG CP24 Take 28 mg by mouth daily.      . Multiple Vitamins-Minerals (MULTIVITAMIN WITH MINERALS) tablet Take 1 tablet by mouth daily.        . Red Yeast Rice Extract (RED YEAST RICE PO) Take 1 tablet by mouth daily.       . rosuvastatin (CRESTOR) 10 MG tablet Take 10 mg by mouth daily.        . traZODone (DESYREL) 50 MG tablet Take 50 mg by mouth at bedtime.        Marland Kitchen warfarin  (COUMADIN) 5 MG tablet Take 2.5-5 mg by mouth daily. 5mg  SuMoFrSa; 2.5mg  Th; take none TuThu.       77 y.o.m admitted 10/15/2012  with recent afib and worsening heart failure. He had 2 weeks of therapeutic anticoagulation PTA. Brought to hospital to undergo transesophageal echo guided cardioversion. A small clot was found in his left atrial appendage and cardioversion was postponed > subsequent severe respiratory distress, intubated.    Events: HF service consulted, Unasyn to end today, no Unasyn doses 10/15 as order discontinued 10/15, will give last two doses unasyn today  AC: Afib, LA clot, mech valve [AVR, but INR goal 2-3]  Home Dose -spoke with family: coumadin in normally 5mg  SuMoFrSa; 2.5mg  Wed; skips TuThu (average TWD 3.2 mg). Dosing has been somewhat variable per family. INR elevated on admission, now 3.21 remains on antibiotics and amiodarone  ID: PNA; afeb wbc wnl  Unasyn (ends 10/16),  Blood NGTD  Sputum OPF, final  CV: S-CHF, Bi-V ICD, VTach, atrial fibrillation , Cardiomyopathy, HLD - now with atrial clot - will need cardioversion after 4 weeks of therapeutic INR - (Statin, Lasix, Amiodarone). No longer on pressors - have not yet resumed Captopril/Carvedilol BP at goal, HR 100s  ENDO: Hx. Gout - takes Allopurinol No hx. DM: CBG 98-102  GI/NTRN: Dysphagia II diet  Renal: Normal rental fxn. With good UOP crcl ~ 50-55 ml/min worsening  Neuro: Ongoing confusion - takes Namenda, Seroquel and Trazadone PTA  PULM: Extubated 10/12,  Hematology / Oncology: Stable PTA Medication Issues - Captopril, Carvedilol -BP just high enough 10/15 to resume, follow Best Practices : VTE prophylaxis   Goal of Therapy:  INR 2-3 Monitor platelets by anticoagulation protocol: Yes   Plan:  1. Warfarin NO mg, on home amiodarone dose but concurrent abx and LFTs increased. 2. Follow INR.  Thank you for allowing pharmacy to be a part of this patients care team.  Lovenia Kim Pharm.D.,  BCPS Clinical Pharmacist 10/21/2012 10:53 AM Pager: [phone] Phone: [phone]

## 2012-10-22 LAB — CULTURE, BLOOD (ROUTINE X 2)

## 2012-10-22 LAB — GLUCOSE, CAPILLARY: Glucose-Capillary: 124 mg/dL — ABNORMAL HIGH (ref 70–99)

## 2012-10-22 LAB — COMPREHENSIVE METABOLIC PANEL
ALT: 325 U/L — ABNORMAL HIGH (ref 0–53)
AST: 174 U/L — ABNORMAL HIGH (ref 0–37)
Albumin: 2.9 g/dL — ABNORMAL LOW (ref 3.5–5.2)
Alkaline Phosphatase: 120 U/L — ABNORMAL HIGH (ref 39–117)
CO2: 28 mEq/L (ref 19–32)
Calcium: 8.2 mg/dL — ABNORMAL LOW (ref 8.4–10.5)
GFR calc non Af Amer: 53 mL/min — ABNORMAL LOW (ref >90)
Sodium: 139 mEq/L (ref 135–145)
Total Protein: 5.6 g/dL — ABNORMAL LOW (ref 6.0–8.3)

## 2012-10-22 LAB — CBC
HCT: 39.4 % (ref 39.0–52.0)
Hemoglobin: 13.3 g/dL (ref 13.0–17.0)
MCH: 32.4 pg (ref 26.0–34.0)
MCHC: 33.8 g/dL (ref 30.0–36.0)
MCV: 96.1 fL (ref 78.0–100.0)
RBC: 4.1 MIL/uL — ABNORMAL LOW (ref 4.22–5.81)
RDW: 15 % (ref 11.5–15.5)

## 2012-10-22 LAB — PROTIME-INR: Prothrombin Time: 26.9 seconds — ABNORMAL HIGH (ref 11.6–15.2)

## 2012-10-22 MED ORDER — POTASSIUM CHLORIDE CRYS ER 20 MEQ PO TBCR
40.0000 meq | EXTENDED_RELEASE_TABLET | Freq: Three times a day (TID) | ORAL | Status: AC
  Administered 2012-10-22 (×3): 40 meq via ORAL
  Filled 2012-10-22 (×3): qty 2

## 2012-10-22 MED ORDER — LISINOPRIL 2.5 MG PO TABS
2.5000 mg | ORAL_TABLET | Freq: Every day | ORAL | Status: DC
  Filled 2012-10-22: qty 1

## 2012-10-22 MED ORDER — FUROSEMIDE 10 MG/ML IJ SOLN
15.0000 mg/h | INTRAVENOUS | Status: DC
  Administered 2012-10-22 – 2012-10-23 (×3): 15 mg/h via INTRAVENOUS
  Filled 2012-10-22 (×7): qty 25

## 2012-10-22 MED ORDER — WARFARIN SODIUM 2.5 MG PO TABS
2.5000 mg | ORAL_TABLET | Freq: Once | ORAL | Status: AC
  Administered 2012-10-22: 2.5 mg via ORAL
  Filled 2012-10-22: qty 1

## 2012-10-22 MED ORDER — METOLAZONE 5 MG PO TABS
5.0000 mg | ORAL_TABLET | Freq: Every day | ORAL | Status: DC
  Administered 2012-10-22 – 2012-10-24 (×3): 5 mg via ORAL
  Filled 2012-10-22 (×4): qty 1

## 2012-10-22 NOTE — Progress Notes (Signed)
ANTICOAGULATION CONSULT NOTE - Follow Up Consult  Pharmacy Consult for  Warfarin Indication: atrial fibrillation  No Known Allergies  Patient Measurements: Height: 5\' 10"  (177.8 cm) Weight: 228 lb 6.4 oz (103.602 kg) IBW/kg (Calculated) : 73  Vital Signs: Temp: 97.5 F (36.4 C) (10/17 0825) Temp src: Oral (10/17 0825) BP: 137/90 mmHg (10/17 0825) Pulse Rate: 93 (10/17 0825)  Labs:  Recent Labs  10/20/12 0415 10/21/12 0519 10/22/12 0500  HGB 13.7  --  13.3  HCT 39.7  --  39.4  PLT 122*  --  151  LABPROT 28.1* 31.7* 26.9*  INR 2.74* 3.21* 2.59*  CREATININE 1.12 1.48* 1.28    Estimated Creatinine Clearance: 60.1 ml/min (by C-G formula based on Cr of 1.28).   Medications:  Prescriptions prior to admission  Medication Sig Dispense Refill  . allopurinol (ZYLOPRIM) 300 MG tablet Take 300 mg by mouth daily.        Marland Kitchen amiodarone (PACERONE) 400 MG tablet Take 1 tablet (400 mg total) by mouth daily.  30 tablet  4  . amoxicillin (AMOXIL) 500 MG capsule Take 500 mg by mouth every other day.       . captopril (CAPOTEN) 50 MG tablet Take 50 mg by mouth 3 (three) times daily.        . carvedilol (COREG) 25 MG tablet Take 25 mg by mouth 2 (two) times daily with a meal.      . Coenzyme Q10 (CO Q-10 PO) Take by mouth daily.        . colchicine 0.6 MG tablet Take 0.6 mg by mouth as needed.       . furosemide (LASIX) 40 MG tablet Take 40 mg by mouth daily.        . memantine (NAMENDA) 5 MG tablet Take 5 mg by mouth daily.      . Memantine HCl ER (NAMENDA XR) 28 MG CP24 Take 28 mg by mouth daily.      . Multiple Vitamins-Minerals (MULTIVITAMIN WITH MINERALS) tablet Take 1 tablet by mouth daily.        . Red Yeast Rice Extract (RED YEAST RICE PO) Take 1 tablet by mouth daily.       . rosuvastatin (CRESTOR) 10 MG tablet Take 10 mg by mouth daily.        . traZODone (DESYREL) 50 MG tablet Take 50 mg by mouth at bedtime.        Marland Kitchen warfarin (COUMADIN) 5 MG tablet Take 2.5-5 mg by mouth  daily. 5mg  SuMoFrSa; 2.5mg  Th; take none TuThu.      77 y.o.m admitted 10/15/2012 with recent afib and worsening heart failure. He had 2 weeks of therapeutic anticoagulation PTA. Brought to hospital to undergo transesophageal echo guided cardioversion. A small clot was found in his left atrial appendage and cardioversion was postponed > subsequent severe respiratory distress, intubated.   Events: looking a little better this AM, still has a lot of volume to go  [facility]: Afib, LA clot, mech valve [AVR, but INR goal 2-3]. Today's INR 2.59  Home Dose -spoke with family: coumadin in normally 5mg  SuMoFrSa; 2.5mg  Wed; skips TuThu (average TWD 3.2 mg). Dosing has been somewhat variable per family. INR elevated on admission, now 3.21 remains on antibiotics and amiodarone  ID: PNA; afeb wbc wnl - 10/16 Cxr pulm edema with possible Infiltrate at bases - can not exclude pna Zosyn EI 10/16>>10/17 Unasyn (ends 10/16)  Blood NGTD Sputum OPF, final  CV: S-CHF, Bi-V ICD, VTach, atrial  fibrillation , Cardiomyopathy, HLD - now with atrial clot - will need cardioversion after 4 weeks of therapeutic INR - (Statin, Lasix, Amiodarone). No longer on pressors - on captopril pta BP 137/90, HR 93 HF: only -983 yesterday, starting Lasix gtt; wt only down 3 lbs. Adding back low dose lisinopril today.  ENDO: Hx. Gout - takes Allopurinol No hx. DM: CBG 105-110  GI/NTRN: Dysphagia II diet  Renal: Scr improved a bit back to 1.28, CrCl ~ 60 ml/min. K = 3.1; adding KCl 40 TID x 3  Neuro: Ongoing confusion - takes Namenda, Seroquel and Trazadone PTA  PULM: Extubated 10/12,  Hematology / Oncology: Stable PTA Medication Issues - Captopril, Carvedilol -BP just high enough 10/15 to resume, follow Best Practices : VTE prophylaxis   Goal of Therapy:  INR 2-3 Monitor platelets by anticoagulation protocol: Yes  Plan:  1. Warfarin 2.5 mg x 1,( on home amiodarone dose but s/p concurrent abx and LFTs increased) 2. Follow  INR.  Tad Moore, BCPS  Clinical Pharmacist Pager [phone]  10/22/2012 10:52 AM

## 2012-10-22 NOTE — Progress Notes (Signed)
Advanced Heart Failure Rounding Note   Subjective:    [name] is a 77 yo male with a history of chronic systolic HF EF 20% due to NICM s/p CRT-D (Medtronic), mechanical AVR ([employer]), LBBB, PAF and non-compliance.   He recently developed Afib/flutter and was scheduled for TEE-DCCV on 09/20/12 at the same time when he had his CRT-D replacement for EOL, however his INR was 1.6 so cardioversion was postponed.   Over the past couple weeks the patient has had increased DOE and weight gain. He had 2 weeks of therapeutic INRs, when he was seen in the office and it was felt that TEE-DCCV needed to be sooner than later d/t increased HF symptoms and fear of INR becoming subtherapeutic. He was started on amiodarone and then brought to the hospital for procedure 10/15/12 but was found to have small clot in atrial appendage so did not proceed with procedure. During the TEE he developed respiratory difficulty and was intubated. BB and ACE-I held and amiodarone gtt was started.   Extubated 10/17/12. Remains on PO amiodarone rate 100-115. Cr was starting to trend up along with LFTs. Started milrinone 0.25 yesterday along with lasix 80 mg IV BID. Weight down 3 lbs, 24 hr I/O -980 cc. Ambulating in the halls with continued DOE. Cr improved 1.28.   Objective:   Weight Range:  Vital Signs:   Temp:  [97.2 F (36.2 C)-97.8 F (36.6 C)] 97.5 F (36.4 C) (10/17 0825) Pulse Rate:  [61-112] 93 (10/17 0825) Resp:  [19-20] 20 (10/16 1453) BP: (108-143)/(54-99) 137/90 mmHg (10/17 0825) SpO2:  [95 %-97 %] 97 % (10/17 0825) Weight:  [103.602 kg (228 lb 6.4 oz)] 103.602 kg (228 lb 6.4 oz) (10/17 0500) Last BM Date: 10/21/12  Weight change: Filed Weights   10/20/12 0500 10/21/12 0500 10/22/12 0500  Weight: 103.9 kg (229 lb 0.9 oz) 104.8 kg (231 lb 0.7 oz) 103.602 kg (228 lb 6.4 oz)    Intake/Output:   Intake/Output Summary (Last 24 hours) at 10/22/12 1201 Last data filed at 10/22/12 1100  Gross per 24 hour   Intake  595.6 ml  Output   2050 ml  Net -1454.4 ml     Physical Exam:  General: Chronically ill appearing. No resp difficulty  HEENT: normal  Neck: supple. JVP 7 . Carotids 2+ bilat; no bruits. No lymphadenopathy or thryomegaly appreciated.  Cor: PMI nondisplaced. irregular rate & rhythm. No rubs, gallops. Mechanical s2. 2/6 MR  Lungs: decreased in the bases Abdomen: soft, nontender, + distended. No hepatosplenomegaly. No bruits or masses. Good bowel sounds.  Extremities: no cyanosis, clubbing, rash, 1 + bilateral edema, erythema  Neuro: alert & orientedx3, cranial nerves grossly intact. moves all 4 extremities w/o difficulty. Affect pleasant   Telemetry: Afib 100-115s  Labs: Basic Metabolic Panel:  Recent Labs Lab 10/17/12 1619 10/18/12 0500 10/19/12 0628 10/20/12 0415 10/21/12 0519 10/22/12 0500  NA 144 145 141 139 138 139  K 3.8 3.4* 3.4* 3.7 3.8 3.1*  CL 104 104 101 99 96 98  CO2 30 30 28 25 30 28   GLUCOSE 151* 114* 114* 95 117* 84  BUN 45* 41* 40* 41* 45* 41*  CREATININE 1.07 0.99 1.13 1.12 1.48* 1.28  CALCIUM 8.2* 8.0* 8.3* 8.4 9.0 8.2*  MG  --  2.1  --   --   --   --   PHOS  --  3.3  --   --   --   --     Liver  Function Tests:  Recent Labs Lab 10/17/12 0430 10/19/12 0628 10/20/12 0415 10/21/12 0519 10/22/12 0500  AST 18 539* 344* 368* 174*  ALT 29 384* 398* 473* 325*  ALKPHOS 46 111 123* 155* 120*  BILITOT 1.0 1.1 1.4* 1.8* 1.7*  PROT 5.5* 6.0 5.9* 6.4 5.6*  ALBUMIN 2.8* 3.0* 3.0* 3.4* 2.9*    Recent Labs Lab 10/19/12 1257 10/20/12 0415  LIPASE 1057* 836*  AMYLASE 566* 488*   No results found for this basename: AMMONIA,  in the last 168 hours  CBC:  Recent Labs Lab 10/15/12 1310  10/17/12 0430 10/18/12 1130 10/19/12 0628 10/20/12 0415 10/22/12 0500  WBC 7.3  < > 10.0 8.3 8.0 8.6 7.6  NEUTROABS 5.9  --   --  6.3 5.6 5.5  --   HGB 14.3  < > 12.2* 11.6* 12.6* 13.7 13.3  HCT 42.2  < > 36.2* 35.3* 39.5 39.7 39.4  MCV 98.1  < > 96.8  98.6 98.5 95.9 96.1  PLT 117*  < > 94* 87* 104* 122* 151  < > = values in this interval not displayed.  Cardiac Enzymes: No results found for this basename: CKTOTAL, CKMB, CKMBINDEX, TROPONINI,  in the last 168 hours  BNP: BNP (last 3 results)  Recent Labs  10/15/12 1312  PROBNP 7041.0*     Imaging: Ct Abdomen Pelvis Wo Contrast  10/20/2012   CLINICAL DATA:  Hepatitis. Elevated amylase and lipase. Assess for pancreatitis.  EXAM: CT ABDOMEN AND PELVIS WITHOUT CONTRAST  TECHNIQUE: Multidetector CT imaging of the abdomen and pelvis was performed following the standard protocol without intravenous contrast.  COMPARISON:  Ultrasound 10/19/2012  FINDINGS: Small bilateral pleural effusions. Atelectasis or infiltrates in the lower lobes bilaterally. Heart is mildly enlarged. Pacer wires noted in the right heart. Prior aortic valve replacement.  Liver, spleen, pancreas, adrenals and kidneys have an unremarkable unenhanced appearance. Gallbladder grossly unremarkable. There is a small amount of perihepatic and perisplenic ascites. Small amount of ascites in the paracolic gutters.  No CT evidence of pancreatitis.  Small diverticulum off the 4th portion of the duodenum. Small bowel is decompressed. Diffuse colonic diverticulosis. No active diverticulitis. Appendix is visualized and is normal.  Urinary bladder is grossly unremarkable. Small amount of air within the urinary bladder, presumably related to recent catheterization. Aorta and iliac vessels are calcified, non aneurysmal. No free air or adenopathy.  No acute bony abnormality.  IMPRESSION: Small bilateral pleural effusions. Small amount of abdominal and pelvic ascites.  No CT changes of pancreatitis.  Diffuse colonic diverticulosis.   Electronically Signed   By: Charlett Nose M.D.   On: 10/20/2012 14:29   Dg Chest Port 1 View  10/21/2012   CLINICAL DATA:  Shortness breath.  EXAM: PORTABLE CHEST - 1 VIEW  COMPARISON:  None.  FINDINGS: Biventricular  AICD in place entering from the left with leads unchanged in position.  Cardiomegaly.  No gross pneumothorax.  Asymmetric airspace disease most notable centrally and involving the lung bases. This may represent pulmonary edema although infectious infiltrate cannot be excluded in the proper clinical setting.  Calcified tortuous aorta.  Elevated right hemidiaphragm.  Haziness left lung base and therefore pleural effusion not excluded.  IMPRESSION: No significant change in asymmetric airspace disease as detailed above.   Electronically Signed   By: Bridgett Larsson M.D.   On: 10/21/2012 08:10     Medications:     Scheduled Medications: . amiodarone  400 mg Oral Daily  . famotidine  20 mg  Oral BID  . levalbuterol  0.63 mg Nebulization BID  . lisinopril  2.5 mg Oral Daily  . multivitamin with minerals  1 tablet Oral Daily  . potassium chloride  40 mEq Oral TID  . QUEtiapine  50 mg Oral QHS  . traZODone  50 mg Oral QHS  . warfarin  2.5 mg Oral ONCE-1800  . Warfarin - Pharmacist Dosing Inpatient   Does not apply q1800    Infusions: . furosemide (LASIX) infusion 15 mg/hr (10/22/12 0928)  . milrinone 0.25 mcg/kg/min (10/22/12 0027)    PRN Medications: sodium chloride, acetaminophen, alum & mag hydroxide-simeth, levalbuterol, ondansetron (ZOFRAN) IV   Assessment:   1) A/C systolic HF  2) NICM, s/p CRT-D (Medtr[employer])  3) Atrial fibrillation, RVR  4) Anticoagulation  5) Respiratory failure  6) Left atrial appendage thrombus  7) Hypokalemia  8) A/C kidney disease, stage 3, likely due to cardiorenal syndrome  9) Elevated AST/ALT  10) Severe protein calorie malnutrition 11) bilateral LLE edema  Plan/Discussion:    Stable overnight. Milrinone started yesterday. Urine output picking up and breathing better. Weight down 3 lbs. Still with volume on board. Will change to lasix gtt 15 mg/hr and watch response. Can place central access to measure co-ox and CVPs, if needed. K+ 3.1 will give 40 meq  TID today. Renal function improving. Continue to hold BB and ACE for now given low output and renal failure.   Remains in AF on po amiodarone. Followed by EP. Rate 100-115. Continue Coumadin and goal to try to cardiovert next week after 4 weeks of therapeutic INRs. SR and CRT appears crucial to manage HF  Continue to ambulate in the halls.  Length of Stay: 7  Ella Golomb BensimhonMD 10/22/2012, 12:01 PM  Advanced Heart Failure Team Pager [phone] (M-F; 7a - 4p)  Please contact [facility] for night-coverage after hours (4p -7a ) and weekends on [email or web address]

## 2012-10-22 NOTE — Progress Notes (Signed)
CARDIAC REHAB PHASE I   PRE:  Rate/Rhythm: 104 A Fib  BP:  Supine:   Sitting: 124/92  Standing:    SaO2: 96 RA  MODE:  Ambulation: 350 ft ft   POST:  Rate/Rhythm: 128 A Fib PVCs  BP:  Supine:   Sitting: 144/93  Standing:    SaO2: 95-96 RA 1420-1500 Pt walked 350 ft on RA with rolling walker and asst x 2 with fairly steady gait. Pt seemed confused at times repeating self and he had hard time comprehending. No c/o SOB. To recliner with call bell.  Called back to room to help pt stand to urinate. Assisted back to sitting in recliner.  Luetta Nutting, RN BSN  10/22/2012 2:54 PM

## 2012-10-23 LAB — BASIC METABOLIC PANEL
BUN: 34 mg/dL — ABNORMAL HIGH (ref 6–23)
Calcium: 8.4 mg/dL (ref 8.4–10.5)
Creatinine, Ser: 1.46 mg/dL — ABNORMAL HIGH (ref 0.50–1.35)
GFR calc Af Amer: 52 mL/min — ABNORMAL LOW (ref >90)
GFR calc non Af Amer: 45 mL/min — ABNORMAL LOW (ref >90)

## 2012-10-23 LAB — GLUCOSE, CAPILLARY: Glucose-Capillary: 108 mg/dL — ABNORMAL HIGH (ref 70–99)

## 2012-10-23 LAB — PROTIME-INR: Prothrombin Time: 25.7 seconds — ABNORMAL HIGH (ref 11.6–15.2)

## 2012-10-23 MED ORDER — WARFARIN SODIUM 2.5 MG PO TABS
2.5000 mg | ORAL_TABLET | Freq: Once | ORAL | Status: AC
  Administered 2012-10-23: 2.5 mg via ORAL
  Filled 2012-10-23: qty 1

## 2012-10-23 NOTE — Progress Notes (Signed)
CARDIAC REHAB PHASE I   PRE:  Rate/Rhythm: 101 afib pvcs  BP:  Sitting: 130/88     SaO2: 93 ra  MODE:  Ambulation: 300 ft   POST:  Rate/Rhythm: 113 afib pvcs  BP:  Sitting: 139/91     SaO2: 96 ra  15:19-15:32 Patient walked well without a walker x1 assist. Patient had not been on a walk yet today so he was encouraged to walk more this evening with a nurse.   Theresa Duty, [place] 10/23/2012 3:31 PM

## 2012-10-23 NOTE — Progress Notes (Signed)
ANTICOAGULATION CONSULT NOTE - Follow Up Consult  Pharmacy Consult for  Warfarin Indication: atrial fibrillation  No Known Allergies  Patient Measurements: Height: 5\' 10"  (177.8 cm) Weight: 223 lb 12.3 oz (101.5 kg) IBW/kg (Calculated) : 73  Vital Signs: Temp: 97 F (36.1 C) (10/18 0800) Temp src: Oral (10/18 0800) BP: 110/60 mmHg (10/18 0800) Pulse Rate: 101 (10/18 0800)  Labs:  Recent Labs  10/21/12 0519 10/22/12 0500 10/23/12 0412  HGB  --  13.3  --   HCT  --  39.4  --   PLT  --  151  --   LABPROT 31.7* 26.9* 25.7*  INR 3.21* 2.59* 2.44*  CREATININE 1.48* 1.28 1.46*    Estimated Creatinine Clearance: 52.2 ml/min (by C-G formula based on Cr of 1.46).   Medications:  Prescriptions prior to admission  Medication Sig Dispense Refill  . allopurinol (ZYLOPRIM) 300 MG tablet Take 300 mg by mouth daily.        Marland Kitchen amiodarone (PACERONE) 400 MG tablet Take 1 tablet (400 mg total) by mouth daily.  30 tablet  4  . amoxicillin (AMOXIL) 500 MG capsule Take 500 mg by mouth every other day.       . captopril (CAPOTEN) 50 MG tablet Take 50 mg by mouth 3 (three) times daily.        . carvedilol (COREG) 25 MG tablet Take 25 mg by mouth 2 (two) times daily with a meal.      . Coenzyme Q10 (CO Q-10 PO) Take by mouth daily.        . colchicine 0.6 MG tablet Take 0.6 mg by mouth as needed.       . furosemide (LASIX) 40 MG tablet Take 40 mg by mouth daily.        . memantine (NAMENDA) 5 MG tablet Take 5 mg by mouth daily.      . Memantine HCl ER (NAMENDA XR) 28 MG CP24 Take 28 mg by mouth daily.      . Multiple Vitamins-Minerals (MULTIVITAMIN WITH MINERALS) tablet Take 1 tablet by mouth daily.        . Red Yeast Rice Extract (RED YEAST RICE PO) Take 1 tablet by mouth daily.       . rosuvastatin (CRESTOR) 10 MG tablet Take 10 mg by mouth daily.        . traZODone (DESYREL) 50 MG tablet Take 50 mg by mouth at bedtime.        Marland Kitchen warfarin (COUMADIN) 5 MG tablet Take 2.5-5 mg by mouth daily.  5mg  SuMoFrSa; 2.5mg  Th; take none TuThu.        77 y.o.m admitted 10/15/2012 with recent afib and worsening heart failure. He had 2 weeks of therapeutic anticoagulation PTA. Brought to hospital to undergo transesophageal echo guided cardioversion. A small clot was found in his left atrial appendage and cardioversion was postponed > subsequent severe respiratory distress, intubated.   Events: Ongoing diuresis  AC: Afib, LA clot, mech valve [AVR, but INR goal 2-3]. Today's INR 2.44  Home Dose -spoke with family: coumadin in normally 5mg  SuMoFrSa; 2.5mg  Wed; skips TuThu (average TWD 3.2 mg). Dosing has been somewhat variable per family. INR elevated on admission, now 3.21 remains on antibiotics and amiodarone  ID: PNA (8d course unasyn completed 10/16); afeb wbc wnl - Blood NGTD Sputum OPF, final  CV: S-CHF, Bi-V ICD, VTach, atrial fibrillation , Cardiomyopathy, HLD - now with atrial clot - will need cardioversion after 4 weeks of therapeutic INR - (  Statin on hold d/t inc LFTs Amiodarone) - on captopril pta BP < goal, HR 100s HF: - 3L yesterday, on  Lasix gtt; down 5 lbs.  ENDO: Hx. Gout - takes Allopurinol No hx. DM: CBG 105-110  GI/NTRN: Dysphagia II diet  Renal: Scr improved a bit back to 1.28, CrCl ~ 50 ml/min. K = 3.4  Neuro: Ongoing confusion - takes Namenda, Seroquel and Trazadone PTA  PULM: Extubated 10/12,  Hematology / Oncology: Stable PTA Medication Issues - Captopril, Carvedilol -BP just high enough 10/15 to resume, follow Best Practices : VTE prophylaxis   Goal of Therapy:  INR 2-3 Monitor platelets by anticoagulation protocol: Yes  Plan:  1. Warfarin 2.5 mg x 1,( on home amiodarone dose but s/p concurrent abx and LFTs increased) 2. Follow INR.  Thank you for allowing pharmacy to be a part of this patients care team.  Lovenia Kim Pharm.D., BCPS Clinical Pharmacist 10/23/2012 11:33 AM Pager: [phone] Phone: [phone]

## 2012-10-23 NOTE — Progress Notes (Signed)
Patient ID: Jon Butler, male   DOB: 24-Jan-1935, 77 y.o.   MRN: [id number] Advanced Heart Failure Rounding Note   Subjective:    Jon Butler is a 77 yo male with a history of chronic systolic HF EF 20% due to NICM s/p CRT-D ([employer]tr[employer]), mechanical AVR (St Jude), LBBB, PAF and non-compliance.   He recently developed Afib/flutter and was scheduled for TEE-DCCV on 09/20/12 at the same time when he had his CRT-D replacement for EOL, however his INR was 1.6 so cardioversion was postponed.   Over the past couple weeks the patient has had increased DOE and weight gain. He had 2 weeks of therapeutic INRs, when he was seen in the office and it was felt that TEE-DCCV needed to be sooner than later d/t increased HF symptoms and fear of INR becoming subtherapeutic. He was started on amiodarone and then brought to the hospital for procedure 10/15/12 but was found to have small clot in atrial appendage so did not proceed with procedure. During the TEE he developed respiratory difficulty and was intubated. BB and ACE-I held and amiodarone gtt was started.   Extubated 10/17/12. Remains on PO amiodarone rate 100-115. Cr was starting to trend up along with LFTs. Started milrinone 0.25 10/16  along with lasix 80 mg IV BID. Weight down 8 lbs, 24 hr I/O -980 cc. Ambulating in the halls with continued DOE. Cr improved 1.28.   Objective:   Weight Range:  Vital Signs:   Temp:  [97 F (36.1 C)-98.5 F (36.9 C)] 97 F (36.1 C) (10/18 0800) Pulse Rate:  [87-111] 101 (10/18 0800) Resp:  [18-22] 20 (10/18 0800) BP: (110-128)/(60-105) 110/60 mmHg (10/18 0800) SpO2:  [92 %-99 %] 92 % (10/18 0800) Weight:  [223 lb 12.3 oz (101.5 kg)] 223 lb 12.3 oz (101.5 kg) (10/18 0500) Last BM Date: 10/21/12  Weight change: Filed Weights   10/21/12 0500 10/22/12 0500 10/23/12 0500  Weight: 231 lb 0.7 oz (104.8 kg) 228 lb 6.4 oz (103.602 kg) 223 lb 12.3 oz (101.5 kg)    Intake/Output:   Intake/Output Summary (Last 24 hours)  at 10/23/12 0934 Last data filed at 10/23/12 0821  Gross per 24 hour  Intake 1094.88 ml  Output   4375 ml  Net -3280.12 ml     Physical Exam:  General: Chronically ill appearing. No resp difficulty  HEENT: normal  Neck: supple. JVP 7 . Carotids 2+ bilat; no bruits. No lymphadenopathy or thryomegaly appreciated.  Cor: PMI nondisplaced. irregular rate & rhythm. No rubs, gallops. Mechanical s2. 2/6 MR  Lungs: decreased in the bases Abdomen: soft, nontender, + distended. No hepatosplenomegaly. No bruits or masses. Good bowel sounds.  Extremities: no cyanosis, clubbing, rash, 1 + bilateral edema, erythema  Neuro: alert & orientedx3, cranial nerves grossly intact. moves all 4 extremities w/o difficulty. Affect pleasant   Telemetry: Afib 100-115s  Labs: Basic Metabolic Panel:  Recent Labs Lab 10/17/12 1619 10/18/12 0500 10/19/12 0628 10/20/12 0415 10/21/12 0519 10/22/12 0500 10/23/12 0412  NA 144 145 141 139 138 139 139  K 3.8 3.4* 3.4* 3.7 3.8 3.1* 3.4*  CL 104 104 101 99 96 98 96  CO2 30 30 28 25 30 28  33*  GLUCOSE 151* 114* 114* 95 117* 84 123*  BUN 45* 41* 40* 41* 45* 41* 34*  CREATININE 1.07 0.99 1.13 1.12 1.48* 1.28 1.46*  CALCIUM 8.2* 8.0* 8.3* 8.4 9.0 8.2* 8.4  MG  --  2.1  --   --   --   --   --  PHOS  --  3.3  --   --   --   --   --     Liver Function Tests:  Recent Labs Lab 10/17/12 0430 10/19/12 0628 10/20/12 0415 10/21/12 0519 10/22/12 0500  AST 18 539* 344* 368* 174*  ALT 29 384* 398* 473* 325*  ALKPHOS 46 111 123* 155* 120*  BILITOT 1.0 1.1 1.4* 1.8* 1.7*  PROT 5.5* 6.0 5.9* 6.4 5.6*  ALBUMIN 2.8* 3.0* 3.0* 3.4* 2.9*    Recent Labs Lab 10/19/12 1257 10/20/12 0415  LIPASE 1057* 836*  AMYLASE 566* 488*    CBC:  Recent Labs Lab 10/17/12 0430 10/18/12 1130 10/19/12 0628 10/20/12 0415 10/22/12 0500  WBC 10.0 8.3 8.0 8.6 7.6  NEUTROABS  --  6.3 5.6 5.5  --   HGB 12.2* 11.6* 12.6* 13.7 13.3  HCT 36.2* 35.3* 39.5 39.7 39.4  MCV 96.8  98.6 98.5 95.9 96.1  PLT 94* 87* 104* 122* 151     BNP: BNP (last 3 results)  Recent Labs  10/15/12 1312  PROBNP 7041.0*     Imaging: No results found.   Medications:     Scheduled Medications: . amiodarone  400 mg Oral Daily  . famotidine  20 mg Oral BID  . metolazone  5 mg Oral Daily  . multivitamin with minerals  1 tablet Oral Daily  . QUEtiapine  50 mg Oral QHS  . traZODone  50 mg Oral QHS  . Warfarin - Pharmacist Dosing Inpatient   Does not apply q1800    Infusions: . furosemide (LASIX) infusion 15 mg/hr (10/23/12 0131)  . milrinone 0.25 mcg/kg/min (10/23/12 0347)    PRN Medications: sodium chloride, acetaminophen, alum & mag hydroxide-simeth, levalbuterol, ondansetron (ZOFRAN) IV   Assessment:   1) A/C systolic HF  2) NICM, s/p CRT-D (Medtronic)  3) Atrial fibrillation, RVR  4) Anticoagulation  5) Respiratory failure  6) Left atrial appendage thrombus  7) Hypokalemia  8) A/C kidney disease, stage 3, likely due to cardiorenal syndrome  9) Elevated AST/ALT  10) Severe protein calorie malnutrition 11) bilateral LLE edema  Plan/Discussion:    Continue lasix and milrinone today EOL AICD replacement was 9/15  Should be ok to do [facility] with no TEE ? Monday Would be nice to be off milrinone when cardioverted So that stimulus for recurrent arrhythmia is less.  Liver enzymes improved Good diuresis and weight loss.  Some erythema at RUE line sight but stable   Length of Stay: 8  Ulla Potash 10/23/2012, 9:34 AM

## 2012-10-24 LAB — PROTIME-INR: INR: 1.91 — ABNORMAL HIGH (ref 0.00–1.49)

## 2012-10-24 LAB — CULTURE, BLOOD (ROUTINE X 2): Culture: NO GROWTH

## 2012-10-24 LAB — GLUCOSE, CAPILLARY: Glucose-Capillary: 113 mg/dL — ABNORMAL HIGH (ref 70–99)

## 2012-10-24 LAB — BASIC METABOLIC PANEL
CO2: 39 mEq/L — ABNORMAL HIGH (ref 19–32)
Chloride: 88 mEq/L — ABNORMAL LOW (ref 96–112)
Potassium: 2.8 mEq/L — ABNORMAL LOW (ref 3.5–5.1)
Sodium: 138 mEq/L (ref 135–145)

## 2012-10-24 MED ORDER — WARFARIN SODIUM 5 MG PO TABS
5.0000 mg | ORAL_TABLET | Freq: Once | ORAL | Status: AC
  Administered 2012-10-24: 5 mg via ORAL
  Filled 2012-10-24 (×2): qty 1

## 2012-10-24 MED ORDER — POTASSIUM CHLORIDE CRYS ER 20 MEQ PO TBCR: 40.0000 meq | EXTENDED_RELEASE_TABLET | Freq: Once | ORAL | Status: AC

## 2012-10-24 MED ORDER — POTASSIUM CHLORIDE CRYS ER 20 MEQ PO TBCR
40.0000 meq | EXTENDED_RELEASE_TABLET | Freq: Once | ORAL | Status: AC
  Administered 2012-10-24: 40 meq via ORAL
  Filled 2012-10-24: qty 2

## 2012-10-24 MED ORDER — SODIUM CHLORIDE 0.9 % IV SOLN
INTRAVENOUS | Status: DC
  Administered 2012-10-24: 17:00:00 via INTRAVENOUS

## 2012-10-24 MED ORDER — FUROSEMIDE 80 MG PO TABS
80.0000 mg | ORAL_TABLET | Freq: Every day | ORAL | Status: DC
  Administered 2012-10-25 – 2012-10-26 (×2): 80 mg via ORAL
  Filled 2012-10-24 (×2): qty 1

## 2012-10-24 MED ORDER — POTASSIUM CHLORIDE CRYS ER 10 MEQ PO TBCR
EXTENDED_RELEASE_TABLET | ORAL | Status: AC
  Administered 2012-10-24: 40 meq
  Filled 2012-10-24: qty 4

## 2012-10-24 NOTE — Progress Notes (Addendum)
Patient ID: Jon Butler, male   DOB: 06-08-1935, 77 y.o.   MRN: [id number] Advanced Heart Failure Rounding Note   Subjective:    Jon Butler is a 77 yo male with a history of chronic systolic HF EF 20% due to NICM s/p CRT-D (Medtronic), mechanical AVR (St Jude), LBBB, PAF and non-compliance.   Jon Butler recently developed Afib/flutter and was scheduled for TEE-DCCV on 09/20/12 at the same time when Jon Butler had his CRT-D replacement for EOL, however his INR was 1.6 so cardioversion was postponed. (ICD generator was changed)  Over the past couple weeks the patient has had increased DOE and weight gain. Jon Butler had 2 weeks of therapeutic INRs, when Jon Butler was seen in the office and it was felt that TEE-DCCV needed to be sooner than later d/t increased HF symptoms and fear of INR becoming subtherapeutic. Jon Butler was started on amiodarone and then brought to the hospital for procedure 10/15/12 but was found to have small clot in atrial appendage so did not proceed with procedure. During the TEE Jon Butler developed respiratory difficulty and was intubated. BB and ACE-I held and amiodarone gtt was started.   Extubated 10/17/12. Remains on PO amiodarone rate 100-115. Remains on milrinone and lasix gtt. Metolazone added to facilitate diuresis. Diuresing very well now. Weight now down 15 pounds. Creatinine up slightly to 1.5. K 2.8. Denies dyspnea.   Objective:   Weight Range:  Vital Signs:   Temp:  [97.4 F (36.3 C)-98.7 F (37.1 C)] 98.1 F (36.7 C) (10/19 1100) Pulse Rate:  [86-117] 117 (10/19 1100) Resp:  [18-20] 18 (10/19 0754) BP: (101-134)/(65-93) 108/65 mmHg (10/19 1100) SpO2:  [92 %-98 %] 94 % (10/19 1100) Weight:  [98.4 kg (216 lb 14.9 oz)] 98.4 kg (216 lb 14.9 oz) (10/19 0453) Last BM Date: 10/21/12  Weight change: Filed Weights   10/22/12 0500 10/23/12 0500 10/24/12 0453  Weight: 103.602 kg (228 lb 6.4 oz) 101.5 kg (223 lb 12.3 oz) 98.4 kg (216 lb 14.9 oz)    Intake/Output:   Intake/Output Summary (Last 24  hours) at 10/24/12 1158 Last data filed at 10/24/12 1100  Gross per 24 hour  Intake 1841.2 ml  Output   3925 ml  Net -2083.8 ml     Physical Exam:  General: Chronically ill appearing. No resp difficulty  HEENT: normal  Neck: supple. JVP 92flat. Carotids 2+ bilat; no bruits. No lymphadenopathy or thryomegaly appreciated.  Cor: PMI nondisplaced. irregular rate & rhythm. No rubs, gallops. Mechanical s2. 2/6 MR  Lungs: decreased in the bases Abdomen: soft, nontender, no distended. No hepatosplenomegaly. No bruits or masses. Good bowel sounds.  Extremities: no cyanosis, clubbing, rash, trace bilateral edema R>L,  Neuro: alert & orientedx3, cranial nerves grossly intact. moves all 4 extremities w/o difficulty. Affect pleasant   Telemetry: Afib 100-115s  Labs: Basic Metabolic Panel:  Recent Labs Lab 10/17/12 1619 10/18/12 0500  10/20/12 0415 10/21/12 0519 10/22/12 0500 10/23/12 0412 10/24/12 0530  NA 144 145  < > 139 138 139 139 138  K 3.8 3.4*  < > 3.7 3.8 3.1* 3.4* 2.8*  CL 104 104  < > 99 96 98 96 88*  CO2 30 30  < > 25 30 28  33* 39*  GLUCOSE 151* 114*  < > 95 117* 84 123* 107*  BUN 45* 41*  < > 41* 45* 41* 34* 31*  CREATININE 1.07 0.99  < > 1.12 1.48* 1.28 1.46* 1.52*  CALCIUM 8.2* 8.0*  < > 8.4 9.0 8.2* 8.4  8.7  MG  --  2.1  --   --   --   --   --   --   PHOS  --  3.3  --   --   --   --   --   --   < > = values in this interval not displayed.  Liver Function Tests:  Recent Labs Lab 10/19/12 0628 10/20/12 0415 10/21/12 0519 10/22/12 0500  AST 539* 344* 368* 174*  ALT 384* 398* 473* 325*  ALKPHOS 111 123* 155* 120*  BILITOT 1.1 1.4* 1.8* 1.7*  PROT 6.0 5.9* 6.4 5.6*  ALBUMIN 3.0* 3.0* 3.4* 2.9*    Recent Labs Lab 10/19/12 1257 10/20/12 0415  LIPASE 1057* 836*  AMYLASE 566* 488*    CBC:  Recent Labs Lab 10/18/12 1130 10/19/12 0628 10/20/12 0415 10/22/12 0500  WBC 8.3 8.0 8.6 7.6  NEUTROABS 6.3 5.6 5.5  --   HGB 11.6* 12.6* 13.7 13.3  HCT 35.3*  39.5 39.7 39.4  MCV 98.6 98.5 95.9 96.1  PLT 87* 104* 122* 151     BNP: BNP (last 3 results)  Recent Labs  10/15/12 1312  PROBNP 7041.0*     Imaging: No results found.   Medications:     Scheduled Medications: . amiodarone  400 mg Oral Daily  . famotidine  20 mg Oral BID  . metolazone  5 mg Oral Daily  . multivitamin with minerals  1 tablet Oral Daily  . QUEtiapine  50 mg Oral QHS  . traZODone  50 mg Oral QHS  . Warfarin - Pharmacist Dosing Inpatient   Does not apply q1800    Infusions: . sodium chloride 5 mL/hr at 10/24/12 0700  . furosemide (LASIX) infusion 15 mg/hr (10/23/12 2210)  . milrinone 0.25 mcg/kg/min (10/24/12 0518)    PRN Medications: sodium chloride, acetaminophen, alum & mag hydroxide-simeth, levalbuterol, ondansetron (ZOFRAN) IV   Assessment:   1) A/C systolic HF  2) NICM, s/p CRT-D (Medtronic)  3) Atrial fibrillation, RVR  4) Anticoagulation  5) Respiratory failure  6) Left atrial appendage thrombus  7) Hypokalemia  8) A/C kidney disease, stage 3, likely due to cardiorenal syndrome  9) Elevated AST/ALT  10) Severe protein calorie malnutrition    Plan/Discussion:    Volume status looks good. Jon Butler says baseline weight is 205 but I don't think we can push much below where we are at now (215 pounds)  Will stop lasix gtt. Wean milrinone to 0.125. Supp K+. Start po diuretics. Will need to resume other HF meds as renal function stabilizes and DC-CV completed.   Jon Butler does not tolerated AF and loss of CRT well. Needs restoration of NSR. Rate control inadequate on po amio. Timing of DC-CV per Dr. Ladona Ridgel will keep NPO after midnight.   Some erythema at RUE line sight but stable   Will likely need f/u in HF clinic as it seems compliance at home has not been very good.   Length of Stay: 9  Kanija Remmel BensimhonMD 10/24/2012, 11:58 AM

## 2012-10-24 NOTE — Progress Notes (Signed)
ANTICOAGULATION CONSULT NOTE - Follow Up Consult  Pharmacy Consult for  Warfarin Indication: atrial fibrillation  No Known Allergies  Patient Measurements: Height: 5\' 10"  (177.8 cm) Weight: 216 lb 14.9 oz (98.4 kg) IBW/kg (Calculated) : 73  Vital Signs: Temp: 98.1 F (36.7 C) (10/19 1100) Temp src: Oral (10/19 1100) BP: 108/65 mmHg (10/19 1100) Pulse Rate: 117 (10/19 1100)  Labs:  Recent Labs  10/22/12 0500 10/23/12 0412 10/24/12 0530  HGB 13.3  --   --   HCT 39.4  --   --   PLT 151  --   --   LABPROT 26.9* 25.7* 21.3*  INR 2.59* 2.44* 1.91*  CREATININE 1.28 1.46* 1.52*    Estimated Creatinine Clearance: 49.4 ml/min (by C-G formula based on Cr of 1.52).   Medications:  Prescriptions prior to admission  Medication Sig Dispense Refill  . allopurinol (ZYLOPRIM) 300 MG tablet Take 300 mg by mouth daily.        Marland Kitchen amiodarone (PACERONE) 400 MG tablet Take 1 tablet (400 mg total) by mouth daily.  30 tablet  4  . amoxicillin (AMOXIL) 500 MG capsule Take 500 mg by mouth every other day.       . captopril (CAPOTEN) 50 MG tablet Take 50 mg by mouth 3 (three) times daily.        . carvedilol (COREG) 25 MG tablet Take 25 mg by mouth 2 (two) times daily with a meal.      . Coenzyme Q10 (CO Q-10 PO) Take by mouth daily.        . colchicine 0.6 MG tablet Take 0.6 mg by mouth as needed.       . furosemide (LASIX) 40 MG tablet Take 40 mg by mouth daily.        . memantine (NAMENDA) 5 MG tablet Take 5 mg by mouth daily.      . Memantine HCl ER (NAMENDA XR) 28 MG CP24 Take 28 mg by mouth daily.      . Multiple Vitamins-Minerals (MULTIVITAMIN WITH MINERALS) tablet Take 1 tablet by mouth daily.        . Red Yeast Rice Extract (RED YEAST RICE PO) Take 1 tablet by mouth daily.       . rosuvastatin (CRESTOR) 10 MG tablet Take 10 mg by mouth daily.        . traZODone (DESYREL) 50 MG tablet Take 50 mg by mouth at bedtime.        Marland Kitchen warfarin (COUMADIN) 5 MG tablet Take 2.5-5 mg by mouth  daily. 5mg  SuMoFrSa; 2.5mg  Th; take none TuThu.        77 y.o.m admitted 10/15/2012 with recent afib and worsening heart failure. He had 2 weeks of therapeutic anticoagulation PTA. Brought to hospital to undergo transesophageal echo guided cardioversion. A small clot was found in his left atrial appendage and cardioversion was postponed > subsequent severe respiratory distress, intubated.   Events: Ongoing diuresis, another day of 4L output, INR dropped < 2, likely d/t improved organ perfusion  AC: Afib, LA clot, mech valve [AVR, but INR goal 2-3]. Today's INR 1.91  Home Dose -spoke with family: coumadin in normally 5mg  SuMoFrSa; 2.5mg  Wed; skips TuThu (average TWD 3.2 mg). Dosing has been somewhat variable per family. INR elevated on admission  ID: PNA (8d course unasyn completed 10/16); afeb wbc wnl - Blood NGTD Sputum OPF, final  CV: S-CHF, Bi-V ICD, VTach, atrial fibrillation , Cardiomyopathy, HLD - now with atrial clot - will need cardioversion  after 4 weeks of therapeutic INR - (Statin on hold d/t inc LFTs Amiodarone) - on captopril pta BP < goal, HR 100s HF: - 2.5L yesterday, on  Lasix gtt; down 7 lbs in 24h ENDO: Hx. Gout - takes Allopurinol No hx. DM: CBG < 150  GI/NTRN: Dysphagia II diet  Renal: Scr trend up , CrCl ~ 50 ml/min. K = 2.8, supped  Neuro: Ongoing confusion - takes Namenda, Seroquel and Trazadone PTA (follow up resume)  PULM: Extubated 10/12,   PTA Medication Issues - Captopril, Carvedilol -plan to resume after DCCV, follow  Best Practices : VTE prophylaxis  warfarin  Goal of Therapy:  INR 2-3 Monitor platelets by anticoagulation protocol: Yes  Plan:  1. Warfarin 5 mg x 1 2. Follow INR.  Thank you for allowing pharmacy to be a part of this patients care team.  Lovenia Kim Pharm.D., BCPS Clinical Pharmacist 10/24/2012 12:51 PM Pager: [phone] Phone: [phone]

## 2012-10-24 NOTE — Progress Notes (Signed)
Patient complained that his arm was hurting where the IV's were at. Both sites are infiltrated. IV team paged and IV meds placed on hold. Another nurse alerted me that he had clamped off his IV earlier causing the IV pumps to alarm while I was in another room providing care. She fixed them and did not note any problems at that time to the IV site. Will notified IV team and MD if not able to get medications restarted in a timely manner.

## 2012-10-24 NOTE — Progress Notes (Signed)
IV team present and was able to get 2 IV's started. Resumed drips and will continue to assess and monitor.

## 2012-10-25 ENCOUNTER — Encounter (HOSPITAL_COMMUNITY)
Admission: RE | Disposition: A | Discharge status: Home Health | Payer: Self-pay | Source: Ambulatory Visit | Attending: Internal Medicine

## 2012-10-25 ENCOUNTER — Inpatient Hospital Stay (HOSPITAL_COMMUNITY): Payer: Medicare Other | Admitting: Certified Registered"

## 2012-10-25 ENCOUNTER — Encounter (HOSPITAL_COMMUNITY): Payer: Medicare Other | Admitting: Certified Registered"

## 2012-10-25 HISTORY — PX: CARDIOVERSION: SHX1299

## 2012-10-25 LAB — BASIC METABOLIC PANEL
BUN: 31 mg/dL — ABNORMAL HIGH (ref 6–23)
Chloride: 91 mEq/L — ABNORMAL LOW (ref 96–112)
Creatinine, Ser: 1.4 mg/dL — ABNORMAL HIGH (ref 0.50–1.35)
GFR calc Af Amer: 55 mL/min — ABNORMAL LOW (ref >90)
Sodium: 136 mEq/L (ref 135–145)

## 2012-10-25 LAB — PROTIME-INR
INR: 1.76 — ABNORMAL HIGH (ref 0.00–1.49)
Prothrombin Time: 20 seconds — ABNORMAL HIGH (ref 11.6–15.2)

## 2012-10-25 SURGERY — CARDIOVERSION
Anesthesia: General | Wound class: Clean

## 2012-10-25 MED ORDER — CARVEDILOL 3.125 MG PO TABS
3.1250 mg | ORAL_TABLET | Freq: Two times a day (BID) | ORAL | Status: DC
  Administered 2012-10-25 – 2012-10-26 (×2): 3.125 mg via ORAL
  Filled 2012-10-25 (×4): qty 1

## 2012-10-25 MED ORDER — POTASSIUM CHLORIDE 10 MEQ/100ML IV SOLN: 10.0000 meq | INTRAVENOUS | Status: DC

## 2012-10-25 MED ORDER — POTASSIUM CHLORIDE CRYS ER 20 MEQ PO TBCR
40.0000 meq | EXTENDED_RELEASE_TABLET | Freq: Once | ORAL | Status: AC
  Administered 2012-10-25: 40 meq via ORAL
  Filled 2012-10-25: qty 2

## 2012-10-25 MED ORDER — PROPOFOL 10 MG/ML IV BOLUS
INTRAVENOUS | Status: DC | PRN
  Administered 2012-10-25: 80 mg via INTRAVENOUS

## 2012-10-25 MED ORDER — ENOXAPARIN SODIUM 100 MG/ML ~~LOC~~ SOLN
1.0000 mg/kg | Freq: Two times a day (BID) | SUBCUTANEOUS | Status: DC
  Administered 2012-10-25 – 2012-10-26 (×3): 95 mg via SUBCUTANEOUS
  Filled 2012-10-25 (×5): qty 1

## 2012-10-25 MED ORDER — WARFARIN SODIUM 7.5 MG PO TABS
7.5000 mg | ORAL_TABLET | Freq: Once | ORAL | Status: AC
  Administered 2012-10-25: 7.5 mg via ORAL
  Filled 2012-10-25: qty 1

## 2012-10-25 NOTE — Progress Notes (Signed)
NUTRITION FOLLOW UP  Intervention:   1.  Supplements; continue Ensure Complete po PRN, each supplement provides 350 kcal and 13 grams of protein. 2.  General healthful diet; encourage PO intake.  Discussed effects of medication on taste.  Encouraged pt to request hot sauce with meals.   Nutrition Dx:   Inadequate oral intake, ongoing  Monitor:   1.  Food/Beverage; pt meeting >/=90% estimated needs with tolerance.  Not met, diet held for procedure 2.  Wt/wt change; monitor trends.  Met, ongoing.   Assessment:   Patient with new onset AF; admitted for planned TEE cardioversion -- atrial appendage thrombus found on TEE and cardioversion was not performed; developed severe respiratory distress with pulmonary edema pattern on CXR and wheezing; transferred emergently to ICU.  Pt has been extubated. Diet advanced to Heart Healthy with thin liquids. Pt is s/p DCCV today; not available at time of visit.  RD notes intake has improved to 50-100% of meals.   Height: Ht Readings from Last 1 Encounters:  10/25/12 5\' 10"  (1.778 m)    Weight Status:   Wt Readings from Last 1 Encounters:  10/25/12 209 lb 14.1 oz (95.2 kg)    Re-estimated needs:  Kcal: 1900-2110 Protein: 90-105g Fluid: ~2.0 L/day  Skin: intact  Diet Order: Cardiac, thin   Intake/Output Summary (Last 24 hours) at 10/25/12 1523 Last data filed at 10/25/12 1000  Gross per 24 hour  Intake 478.37 ml  Output   1050 ml  Net -571.63 ml    Last BM: 10/19  Labs:   Recent Labs Lab 10/23/12 0412 10/24/12 0530 10/25/12 0448  NA 139 138 136  K 3.4* 2.8* 3.1*  CL 96 88* 91*  CO2 33* 39* 35*  BUN 34* 31* 31*  CREATININE 1.46* 1.52* 1.40*  CALCIUM 8.4 8.7 8.7  GLUCOSE 123* 107* 91    CBG (last 3)   Recent Labs  10/23/12 1657 10/23/12 2140 10/25/12 0826  GLUCAP 108* 113* 97    Scheduled Meds: . amiodarone  400 mg Oral Daily  . carvedilol  3.125 mg Oral BID WC  . enoxaparin (LOVENOX) injection  1 mg/kg  Subcutaneous Q12H  . famotidine  20 mg Oral BID  . furosemide  80 mg Oral Daily  . multivitamin with minerals  1 tablet Oral Daily  . QUEtiapine  50 mg Oral QHS  . traZODone  50 mg Oral QHS  . warfarin  7.5 mg Oral ONCE-1800  . Warfarin - Pharmacist Dosing Inpatient   Does not apply q1800    Continuous Infusions: . sodium chloride 10 mL/hr (10/25/12 0800)    Loyce Dys, MS RD LDN Clinical Inpatient Dietitian Pager: [phone] Weekend/After hours pager: [phone]

## 2012-10-25 NOTE — H&P (View-Only) (Signed)
Patient ID: Jon Butler, male   DOB: 1935/01/23, 77 y.o.   MRN: [id number] Subjective:  Dyspnea is improved. Weight improved.  Objective:  Vital Signs in the last 24 hours: Temp:  [97.4 F (36.3 C)-98.9 F (37.2 C)] 98.1 F (36.7 C) (10/20 0400) Pulse Rate:  [92-117] 92 (10/19 1712) Resp:  [19] 19 (10/19 1712) BP: (90-112)/(65-83) 112/67 mmHg (10/20 0400) SpO2:  [94 %-97 %] 96 % (10/20 0400) Weight:  [209 lb 14.1 oz (95.2 kg)] 209 lb 14.1 oz (95.2 kg) (10/20 0441)  Intake/Output from previous day: 10/19 0701 - 10/20 0700 In: 1240 [P.O.:960; I.V.:280] Out: 1975 [Urine:1975] Intake/Output from this shift: Total I/O In: 13.9 [I.V.:13.9] Out: -   Physical Exam: stable appearing NAD HEENT: Unremarkable Neck:  No JVD, no thyromegally Back:  No CVA tenderness Lungs:  Clear with no wheezes HEART:  IRegular rate rhythm, no murmurs, no rubs, no clicks Abd:  soft, positive bowel sounds, no organomegally, no rebound, no guarding Ext:  2 plus pulses, no edema, no cyanosis, no clubbing Skin:  No rashes no nodules Neuro:  CN II through XII intact, motor grossly intact  Lab Results: No results found for this basename: WBC, HGB, PLT,  in the last 72 hours  Recent Labs  10/24/12 0530 10/25/12 0448  NA 138 136  K 2.8* 3.1*  CL 88* 91*  CO2 39* 35*  GLUCOSE 107* 91  BUN 31* 31*  CREATININE 1.52* 1.40*   No results found for this basename: TROPONINI, CK, MB,  in the last 72 hours Hepatic Function Panel No results found for this basename: PROT, ALBUMIN, AST, ALT, ALKPHOS, BILITOT, BILIDIR, IBILI,  in the last 72 hours No results found for this basename: CHOL,  in the last 72 hours No results found for this basename: PROTIME,  in the last 72 hours  Imaging: No results found.  Cardiac Studies: Tele - atrial fibrillation Assessment/Plan:  1. Atrial fibrillation with an RVR - The patient has dropped below therapeutic with his INR. He did not receive lovenox yesterday. He has  been approximately 30-36 hours subtherapeutic, with regard to his INR. I have reviewed the results of the TEE with Dr. Tenny Craw. As much as anything, a LAA clot could not be ruled out as she could not see all the way into the LA appendage on TEE. He has been therapeutic from the perspective of the INR for 4 weeks excepting the last 30 hours. In light of his severe CHF, largely exacerbated by uncontrolled atrial fibrillation with uncontrolled ventricular response, we are at a juncture where the risk/benefit ratio suggests proceeding with DCCV. He will not tolerate 3 weeks waiting for the INR's to remain therapeutic. I have discussed all of these issues with the patient and will plan to also discuss with the patient's son whom I know personally before proceeding with DCCV. He will remain on Lovenox for 48 hours after his INR has become therapeutic.  LOS: 10 days    Gregg Taylor,M.D. 10/25/2012, 8:05 AM

## 2012-10-25 NOTE — Anesthesia Postprocedure Evaluation (Signed)
  Anesthesia Post-op Note  Patient: Jon Butler  Procedure(s) Performed: Procedure(s): CARDIOVERSION (N/A)  Patient Location: Nursing Unit  Anesthesia Type:General  Level of Consciousness: awake, alert  and oriented  Airway and Oxygen Therapy: Patient Spontanous Breathing and Patient connected to nasal cannula oxygen  Post-op Pain: none  Post-op Assessment: Post-op Vital signs reviewed, Patient's Cardiovascular Status Stable, Respiratory Function Stable, Patent Airway and No signs of Nausea or vomiting  Post-op Vital Signs: Reviewed and stable  Complications: No apparent anesthesia complications

## 2012-10-25 NOTE — Progress Notes (Signed)
Patient ID: Jon Butler, male   DOB: December 14, 1935, 77 y.o.   MRN: [id number] Advanced Heart Failure Rounding Note   Subjective:    Jon Butler is a 77 yo male with a history of chronic systolic HF EF 20% due to NICM s/p CRT-D ([employer]tr[employer]), mechanical AVR ([employer] Jude), LBBB, PAF and non-compliance.   He recently developed Afib/flutter and was scheduled for TEE-DCCV on 09/20/12 at the same time when he had his CRT-D replacement for EOL, however his INR was 1.6 so cardioversion was postponed. (ICD generator was changed)  Over the past couple weeks the patient has had increased DOE and weight gain. He had 2 weeks of therapeutic INRs, when he was seen in the office and it was felt that TEE-DCCV needed to be sooner than later d/t increased HF symptoms and fear of INR becoming subtherapeutic. He was started on amiodarone and then brought to the hospital for procedure 10/15/12 but was found to have small clot in atrial appendage so did not proceed with procedure. During the TEE he developed respiratory difficulty and was intubated. BB and ACE-I held and amiodarone gtt was started.   Extubated 10/17/12. Remains on PO amiodarone rate 100-115. Yesterday lasix drip stopped and milronone was cut back to 0.125 mcg. Weight down 7 pounds over night?.  Overall his weight is down 23 pounds. Denies SOB/Orthopnea  Creatinine down slightly 1.5>1.4.  K 3.1.    Objective:   Weight Range:  Vital Signs:   Temp:  [97.4 F (36.3 C)-98.9 F (37.2 C)] 98.1 F (36.7 C) (10/20 0400) Pulse Rate:  [92-117] 92 (10/19 1712) Resp:  [19] 19 (10/19 1712) BP: (90-112)/(65-83) 112/67 mmHg (10/20 0400) SpO2:  [94 %-97 %] 96 % (10/20 0400) Weight:  [95.2 kg (209 lb 14.1 oz)] 95.2 kg (209 lb 14.1 oz) (10/20 0441) Last BM Date: 10/24/12  Weight change: Filed Weights   10/23/12 0500 10/24/12 0453 10/25/12 0441  Weight: 101.5 kg (223 lb 12.3 oz) 98.4 kg (216 lb 14.9 oz) 95.2 kg (209 lb 14.1 oz)    Intake/Output:   Intake/Output  Summary (Last 24 hours) at 10/25/12 0756 Last data filed at 10/25/12 0000  Gross per 24 hour  Intake 1239.97 ml  Output   1300 ml  Net -60.03 ml     Physical Exam:  General:NAD No resp difficulty  HEENT: normal  Neck: supple. JVP flat. Carotids 2+ bilat; no bruits. No lymphadenopathy or thryomegaly appreciated.  Cor: PMI nondisplaced. irregular rate & rhythm. No rubs, gallops. Mechanical s2. 2/6 MR  Lungs: dclear Abdomen: soft, nontender, no distended. No hepatosplenomegaly. No bruits or masses. Good bowel sounds.  Extremities: no cyanosis, clubbing, rash, no edema, Bilateral ted hose Neuro: alert & orientedx3, cranial nerves grossly intact. moves all 4 extremities w/o difficulty. Affect pleasant   Telemetry: Afib 100-110s with PVCs.   Labs: Basic Metabolic Panel:  Recent Labs Lab 10/21/12 0519 10/22/12 0500 10/23/12 0412 10/24/12 0530 10/25/12 0448  NA 138 139 139 138 136  K 3.8 3.1* 3.4* 2.8* 3.1*  CL 96 98 96 88* 91*  CO2 30 28 33* 39* 35*  GLUCOSE 117* 84 123* 107* 91  BUN 45* 41* 34* 31* 31*  CREATININE 1.48* 1.28 1.46* 1.52* 1.40*  CALCIUM 9.0 8.2* 8.4 8.7 8.7    Liver Function Tests:  Recent Labs Lab 10/19/12 0628 10/20/12 0415 10/21/12 0519 10/22/12 0500  AST 539* 344* 368* 174*  ALT 384* 398* 473* 325*  ALKPHOS 111 123* 155* 120*  BILITOT 1.1  1.4* 1.8* 1.7*  PROT 6.0 5.9* 6.4 5.6*  ALBUMIN 3.0* 3.0* 3.4* 2.9*    Recent Labs Lab 10/19/12 1257 10/20/12 0415  LIPASE 1057* 836*  AMYLASE 566* 488*    CBC:  Recent Labs Lab 10/18/12 1130 10/19/12 0628 10/20/12 0415 10/22/12 0500  WBC 8.3 8.0 8.6 7.6  NEUTROABS 6.3 5.6 5.5  --   HGB 11.6* 12.6* 13.7 13.3  HCT 35.3* 39.5 39.7 39.4  MCV 98.6 98.5 95.9 96.1  PLT 87* 104* 122* 151     BNP: BNP (last 3 results)  Recent Labs  10/15/12 1312  PROBNP 7041.0*     Imaging: No results found.   Medications:     Scheduled Medications: . amiodarone  400 mg Oral Daily  . enoxaparin  (LOVENOX) injection  1 mg/kg Subcutaneous Q12H  . famotidine  20 mg Oral BID  . furosemide  80 mg Oral Daily  . multivitamin with minerals  1 tablet Oral Daily  . potassium chloride  40 mEq Oral Once  . QUEtiapine  50 mg Oral QHS  . traZODone  50 mg Oral QHS  . Warfarin - Pharmacist Dosing Inpatient   Does not apply q1800    Infusions: . sodium chloride 10 mL/hr at 10/25/12 0100  . milrinone 0.125 mcg/kg/min (10/25/12 0100)    PRN Medications: sodium chloride, acetaminophen, alum & mag hydroxide-simeth, levalbuterol, ondansetron (ZOFRAN) IV   Assessment:   1) A/C systolic HF  2) NICM, s/p CRT-D ([employer]tr[employer])  3) Atrial fibrillation, RVR  4) Anticoagulation  5) Respiratory failure  6) Left atrial appendage thrombus  7) Hypokalemia  8) A/C kidney disease, stage 3, likely due to cardiorenal syndrome  9) Elevated AST/ALT - likely shock liver  10) Severe protein calorie malnutrition   Plan/Discussion:    Volume status looks good. Now euvolemic. Overall weight down 23 pounds. IV lasix stopped yesterday.  Start 80 mg po lasix today. Stop Milrinone.  Supplement Potassium . Will need to resume other HF meds as renal function stabilizes and DC-CV completed.   He does not tolerate AF and loss of CRT well. Needs restoration of NSR. Rate control inadequate on po amio. INR down this am. Now 1.7. Add lovenox. Discussed with Dr Ladona Ridgel the timing of DC-CV.   Hopefully can be done soon.   Suspect LFTs up due to shock liver from HF but amylase and lipase also up. May be due in part to ETOH. Will recheck in am.  Will likely need f/u in HF clinic as it seems compliance at home has not been very good.   Dispo: If cardioverted today and INR >=2.0 tomorrow can go home tomorrow with gradual reinitiation of HF meds in HF clinic.    Jon Fandrich,MD 8:01 AM   Length of Stay: 10

## 2012-10-25 NOTE — Transfer of Care (Signed)
Immediate Anesthesia Transfer of Care Note  Patient: Jon Butler  Procedure(s) Performed: Procedure(s): CARDIOVERSION (N/A)  Patient Location: ICU  Anesthesia Type:General  Level of Consciousness: awake, alert  and oriented  Airway & Oxygen Therapy: Patient Spontanous Breathing and Patient connected to nasal cannula oxygen  Post-op Assessment: Report given to PACU RN, Post -op Vital signs reviewed and stable and Patient moving all extremities X 4  Post vital signs: Reviewed and stable  Complications: No apparent anesthesia complications

## 2012-10-25 NOTE — Progress Notes (Signed)
Patient ID: Jon Butler, male   DOB: 02/10/1935, 77 y.o.   MRN: [id number] Subjective:  Dyspnea is improved. Weight improved.  Objective:  Vital Signs in the last 24 hours: Temp:  [97.4 F (36.3 C)-98.9 F (37.2 C)] 98.1 F (36.7 C) (10/20 0400) Pulse Rate:  [92-117] 92 (10/19 1712) Resp:  [19] 19 (10/19 1712) BP: (90-112)/(65-83) 112/67 mmHg (10/20 0400) SpO2:  [94 %-97 %] 96 % (10/20 0400) Weight:  [209 lb 14.1 oz (95.2 kg)] 209 lb 14.1 oz (95.2 kg) (10/20 0441)  Intake/Output from previous day: 10/19 0701 - 10/20 0700 In: 1240 [P.O.:960; I.V.:280] Out: 1975 [Urine:1975] Intake/Output from this shift: Total I/O In: 13.9 [I.V.:13.9] Out: -   Physical Exam: stable appearing NAD HEENT: Unremarkable Neck:  No JVD, no thyromegally Back:  No CVA tenderness Lungs:  Clear with no wheezes HEART:  IRegular rate rhythm, no murmurs, no rubs, no clicks Abd:  soft, positive bowel sounds, no organomegally, no rebound, no guarding Ext:  2 plus pulses, no edema, no cyanosis, no clubbing Skin:  No rashes no nodules Neuro:  CN II through XII intact, motor grossly intact  Lab Results: No results found for this basename: WBC, HGB, PLT,  in the last 72 hours  Recent Labs  10/24/12 0530 10/25/12 0448  NA 138 136  K 2.8* 3.1*  CL 88* 91*  CO2 39* 35*  GLUCOSE 107* 91  BUN 31* 31*  CREATININE 1.52* 1.40*   No results found for this basename: TROPONINI, CK, MB,  in the last 72 hours Hepatic Function Panel No results found for this basename: PROT, ALBUMIN, AST, ALT, ALKPHOS, BILITOT, BILIDIR, IBILI,  in the last 72 hours No results found for this basename: CHOL,  in the last 72 hours No results found for this basename: PROTIME,  in the last 72 hours  Imaging: No results found.  Cardiac Studies: Tele - atrial fibrillation Assessment/Plan:  1. Atrial fibrillation with an RVR - The patient has dropped below therapeutic with his INR. He did not receive lovenox yesterday. He has  been approximately 30-36 hours subtherapeutic, with regard to his INR. I have reviewed the results of the TEE with Dr. Ross. As much as anything, a LAA clot could not be ruled out as she could not see all the way into the LA appendage on TEE. He has been therapeutic from the perspective of the INR for 4 weeks excepting the last 30 hours. In light of his severe CHF, largely exacerbated by uncontrolled atrial fibrillation with uncontrolled ventricular response, we are at a juncture where the risk/benefit ratio suggests proceeding with DCCV. He will not tolerate 3 weeks waiting for the INR's to remain therapeutic. I have discussed all of these issues with the patient and will plan to also discuss with the patient's son whom I know personally before proceeding with DCCV. He will remain on Lovenox for 48 hours after his INR has become therapeutic.  LOS: 10 days    Gregg Taylor,M.D. 10/25/2012, 8:05 AM    

## 2012-10-25 NOTE — Anesthesia Postprocedure Evaluation (Signed)
  Anesthesia Post-op Note  Patient: Jon Butler  Procedure(s) Performed: Procedure(s): CARDIOVERSION (N/A)  Patient Location: PACU  Anesthesia Type:MAC  Level of Consciousness: awake  Airway and Oxygen Therapy: Patient Spontanous Breathing  Post-op Pain: mild  Post-op Assessment: Post-op Vital signs reviewed  Post-op Vital Signs: Reviewed  Complications: No apparent anesthesia complications

## 2012-10-25 NOTE — CV Procedure (Signed)
EP Procedure Note  Procedure: DCCV  Indication: persistent atrial fibrillation  Findings: After informed consent was obtained, the patient was prepped in the usual manner. The electrodispersive pad was placed in the AP position. 80 mg of IV propafol was given intravenously. 200 joules of synch biphasic energy was given shocking the patient to atrial tachycardia and he was paced back to NSR by pacing the atria at 500 ms. He tolerated the procedure well and there was no immediate complications.   Complications: None immediately.  Leonia Reeves.D.

## 2012-10-25 NOTE — Preoperative (Signed)
Beta Blockers   Reason not to administer Beta Blockers:Not Applicable 

## 2012-10-25 NOTE — Progress Notes (Signed)
CARDIAC REHAB PHASE I   PRE:  Rate/Rhythm: 93afib  BP:  Supine: 124/84  Sitting:   Standing:    SaO2: 94%RA  MODE:  Ambulation: 700 ft   POST:  Rate/Rhythm: 116 afib PVCs  BP:  Supine: 137/99  Sitting:   Standing:    SaO2: 96%RA 1000-1020 Pt walked 700 ft with asst x 1 with steady gait. Tolerated well except BP elevated after walk. To bed. No SOB noted. Wife in room.   Luetta Nutting, RN BSN  10/25/2012 10:17 AM

## 2012-10-25 NOTE — Anesthesia Preprocedure Evaluation (Addendum)
Anesthesia Evaluation  Patient identified by MRN, date of birth, ID band Patient awake    Reviewed: Allergy & Precautions, H&P , NPO status , Patient's Chart, lab work & pertinent test results  Airway Mallampati: I      Dental  (+) Teeth Intact   Pulmonary          Cardiovascular + dysrhythmias     Neuro/Psych    GI/Hepatic   Endo/Other    Renal/GU Renal InsufficiencyRenal disease     Musculoskeletal   Abdominal   Peds  Hematology   Anesthesia Other Findings   Reproductive/Obstetrics                          Anesthesia Physical Anesthesia Plan  ASA: III  Anesthesia Plan: General   Post-op Pain Management:    Induction: Intravenous  Airway Management Planned: Simple Face Mask  Additional Equipment:   Intra-op Plan:   Post-operative Plan:   Informed Consent: I have reviewed the patients History and Physical, chart, labs and discussed the procedure including the risks, benefits and alternatives for the proposed anesthesia with the patient or authorized representative who has indicated his/her understanding and acceptance.   Dental advisory given  Plan Discussed with: CRNA and Anesthesiologist  Anesthesia Plan Comments:         Anesthesia Quick Evaluation

## 2012-10-25 NOTE — Interval H&P Note (Signed)
History and Physical Interval Note: Patient seen and examined. I have discussed the risks/benefits/goals/expectations of DCCV with the patient and his family and specifically outlined the small increased risk of stroke and they all wish to proceed.   10/25/2012 1:59 PM  Jon Butler  has presented today for surgery, with the diagnosis of a fib  The various methods of treatment have been discussed with the patient and family. After consideration of risks, benefits and other options for treatment, the patient has consented to  Procedure(s): CARDIOVERSION (N/A) as a surgical intervention .  The patient's history has been reviewed, patient examined, no change in status, stable for surgery.  I have reviewed the patient's chart and labs.  Questions were answered to the patient's satisfaction.     Leonia Reeves.D.

## 2012-10-25 NOTE — Progress Notes (Signed)
ANTICOAGULATION CONSULT NOTE - Follow Up Consult  Pharmacy Consult for Warfarin / Lovenox Indication: atrial fibrillation  No Known Allergies  Patient Measurements: Height: 5\' 10"  (177.8 cm) Weight: 209 lb 14.1 oz (95.2 kg) IBW/kg (Calculated) : 73   Vital Signs: Temp: 97.8 F (36.6 C) (10/20 0823) Temp src: Oral (10/20 0823) BP: 124/88 mmHg (10/20 1000) Pulse Rate: 78 (10/20 0823)  Labs:  Recent Labs  10/23/12 0412 10/24/12 0530 10/25/12 0448  LABPROT 25.7* 21.3* 20.0*  INR 2.44* 1.91* 1.76*  CREATININE 1.46* 1.52* 1.40*    Estimated Creatinine Clearance: 52.8 ml/min (by C-G formula based on Cr of 1.4).    Assessment: 77yom admitted with HF EF 20% and A-FIb RVR now CVR on amiodarone and planned TEE/DCCV.  His  INR was supratherapeutic on admit but has now fallen to < 2.  INR today 1.76 - begin enoxaparin 1mg /kg = 95mg  q12 today ntil INR > 2.  No bleeding noted, last CBC stable will recheck tomorrow.  LFT elevated on admit will recheck tomorrow.  Goal of Therapy:  INR 2-3 Monitor platelets by anticoagulation protocol: Yes   Plan:  Warfarin 7.5mg  x1 today Lovenox 95mg  q12 Daily Protime CBC, Cmet with AML  Leota Sauers Pharm.D. CPP, BCPS Clinical Pharmacist [phone] 10/25/2012 12:25 PM

## 2012-10-26 ENCOUNTER — Encounter (HOSPITAL_COMMUNITY): Payer: Self-pay | Admitting: Internal Medicine

## 2012-10-26 ENCOUNTER — Encounter: Payer: Medicare Other | Admitting: Internal Medicine

## 2012-10-26 LAB — COMPREHENSIVE METABOLIC PANEL
ALT: 122 U/L — ABNORMAL HIGH (ref 0–53)
AST: 53 U/L — ABNORMAL HIGH (ref 0–37)
Albumin: 3.2 g/dL — ABNORMAL LOW (ref 3.5–5.2)
Alkaline Phosphatase: 90 U/L (ref 39–117)
CO2: 36 mEq/L — ABNORMAL HIGH (ref 19–32)
Calcium: 8.7 mg/dL (ref 8.4–10.5)
GFR calc Af Amer: 65 mL/min — ABNORMAL LOW (ref >90)
GFR calc non Af Amer: 56 mL/min — ABNORMAL LOW (ref >90)
Glucose, Bld: 87 mg/dL (ref 70–99)
Sodium: 137 mEq/L (ref 135–145)
Total Bilirubin: 1.3 mg/dL — ABNORMAL HIGH (ref 0.3–1.2)

## 2012-10-26 LAB — CBC
HCT: 41 % (ref 39.0–52.0)
Hemoglobin: 13.6 g/dL (ref 13.0–17.0)
MCHC: 33.2 g/dL (ref 30.0–36.0)
MCV: 97.6 fL (ref 78.0–100.0)
Platelets: 175 10*3/uL (ref 150–400)
RDW: 15.2 % (ref 11.5–15.5)
WBC: 8.3 10*3/uL (ref 4.0–10.5)

## 2012-10-26 LAB — PROTIME-INR: INR: 2.03 — ABNORMAL HIGH (ref 0.00–1.49)

## 2012-10-26 LAB — LIPASE, BLOOD: Lipase: 260 U/L — ABNORMAL HIGH (ref 11–59)

## 2012-10-26 MED ORDER — POTASSIUM CHLORIDE CRYS ER 20 MEQ PO TBCR
40.0000 meq | EXTENDED_RELEASE_TABLET | Freq: Once | ORAL | Status: AC
  Administered 2012-10-26: 40 meq via ORAL

## 2012-10-26 MED ORDER — AMIODARONE HCL 400 MG PO TABS: ORAL_TABLET | ORAL | Status: DC

## 2012-10-26 MED ORDER — FUROSEMIDE 40 MG PO TABS: 40.0000 mg | ORAL_TABLET | Freq: Every day | ORAL | Status: DC

## 2012-10-26 MED ORDER — LISINOPRIL 10 MG PO TABS: 10.0000 mg | ORAL_TABLET | Freq: Every day | ORAL | Status: DC

## 2012-10-26 MED ORDER — POTASSIUM CHLORIDE CRYS ER 20 MEQ PO TBCR
80.0000 meq | EXTENDED_RELEASE_TABLET | ORAL | Status: AC
  Administered 2012-10-26: 80 meq via ORAL
  Filled 2012-10-26: qty 4

## 2012-10-26 MED ORDER — SPIRONOLACTONE 12.5 MG HALF TABLET: 12.5000 mg | ORAL_TABLET | Freq: Every day | ORAL | Status: DC

## 2012-10-26 MED ORDER — CARVEDILOL 3.125 MG PO TABS: 6.2500 mg | ORAL_TABLET | Freq: Two times a day (BID) | ORAL | Status: DC

## 2012-10-26 MED ORDER — SPIRONOLACTONE 12.5 MG HALF TABLET
12.5000 mg | ORAL_TABLET | Freq: Every day | ORAL | Status: DC
  Administered 2012-10-26: 12.5 mg via ORAL
  Filled 2012-10-26: qty 1

## 2012-10-26 MED ORDER — WARFARIN SODIUM 5 MG PO TABS: ORAL_TABLET | ORAL | Status: DC

## 2012-10-26 MED ORDER — ASPIRIN EC 81 MG PO TBEC: 81.0000 mg | DELAYED_RELEASE_TABLET | Freq: Every day | ORAL | Status: DC

## 2012-10-26 NOTE — Progress Notes (Addendum)
Patient ID: Jon Butler, male   DOB: [birth year], 77 y.o.   MRN: [id number] Advanced Heart Failure Rounding Note   Subjective:    Jon Butler is a 77 yo male with a history of chronic systolic HF EF 20% due to NICM s/p CRT-D ([employer]), mechanical AVR ([employer]), LBBB, PAF and non-compliance.   He recently developed Afib/flutter and was scheduled for TEE-DCCV on 09/20/12 at the same time when he had his CRT-D replacement for EOL, however his INR was 1.6 so cardioversion was postponed. (ICD generator was changed)  Over the past couple weeks the patient has had increased DOE and weight gain. He had 2 weeks of therapeutic INRs, when he was seen in the office and it was felt that TEE-DCCV needed to be sooner than later d/t increased HF symptoms and fear of INR becoming subtherapeutic. He was started on amiodarone and then brought to the hospital for procedure 10/15/12 but was found to have small clot in atrial appendage so did not proceed with procedure. During the TEE he developed respiratory difficulty and was intubated. BB and ACE-I held and amiodarone gtt was started.   Extubated 10/17/12. Remains on PO amiodarone rate 100-115. Milrinone and IV lasix stopped on Sunday 10/19. Yesterday underwent successful DC-CV after further amio load. INR now 2.03. Feels great.  Weight down another 3 pounds (total 26 pounds)  Objective:   Weight Range:  Vital Signs:   Temp:  [97.6 F (36.4 C)-98.5 F (36.9 C)] 97.8 F (36.6 C) (10/21 0325) Pulse Rate:  [59-105] 75 (10/20 1900) BP: (99-146)/(75-92) 116/88 mmHg (10/21 0325) SpO2:  [94 %-100 %] 95 % (10/21 0325) Weight:  [93.486 kg (206 lb 1.6 oz)] 93.486 kg (206 lb 1.6 oz) (10/21 0300) Last BM Date: 10/24/12  Weight change: Filed Weights   10/24/12 0453 10/25/12 0441 10/26/12 0300  Weight: 98.4 kg (216 lb 14.9 oz) 95.2 kg (209 lb 14.1 oz) 93.486 kg (206 lb 1.6 oz)    Intake/Output:   Intake/Output Summary (Last 24 hours) at 10/26/12 0747 Last data  filed at 10/26/12 0400  Gross per 24 hour  Intake  667.8 ml  Output   2500 ml  Net -1832.2 ml     Physical Exam:  General:NAD No resp difficulty  HEENT: normal  Neck: supple. JVP flat. Carotids 2+ bilat; no bruits. No lymphadenopathy or thryomegaly appreciated.  Cor: PMI nondisplaced. regular rate & rhythm. No rubs, gallops. Mechanical s2. 2/6 MR  Lungs: dclear Abdomen: soft, nontender, no distended. No hepatosplenomegaly. No bruits or masses. Good bowel sounds.  Extremities: no cyanosis, clubbing, rash, no edema, Bilateral ted hose Neuro: alert & orientedx3, cranial nerves grossly intact. moves all 4 extremities w/o difficulty. Affect pleasant   Telemetry: sinus rhythm with v-pacins  Labs: Basic Metabolic Panel:  Recent Labs Lab 10/22/12 0500 10/23/12 0412 10/24/12 0530 10/25/12 0448 10/26/12 0450  NA 139 139 138 136 137  K 3.1* 3.4* 2.8* 3.1* 3.0*  CL 98 96 88* 91* 93*  CO2 28 33* 39* 35* 36*  GLUCOSE 84 123* 107* 91 87  BUN 41* 34* 31* 31* 26*  CREATININE 1.28 1.46* 1.52* 1.40* 1.23  CALCIUM 8.2* 8.4 8.7 8.7 8.7    Liver Function Tests:  Recent Labs Lab 10/20/12 0415 10/21/12 0519 10/22/12 0500 10/26/12 0450  AST 344* 368* 174* 53*  ALT 398* 473* 325* 122*  ALKPHOS 123* 155* 120* 90  BILITOT 1.4* 1.8* 1.7* 1.3*  PROT 5.9* 6.4 5.6* 5.9*  ALBUMIN 3.0* 3.4* 2.9*  3.2*    Recent Labs Lab 10/19/12 1257 10/20/12 0415 10/26/12 0450  LIPASE 1057* 836* 260*  AMYLASE 566* 488* 242*    CBC:  Recent Labs Lab 10/20/12 0415 10/22/12 0500 10/26/12 0450  WBC 8.6 7.6 8.3  NEUTROABS 5.5  --   --   HGB 13.7 13.3 13.6  HCT 39.7 39.4 41.0  MCV 95.9 96.1 97.6  PLT 122* 151 175     BNP: BNP (last 3 results)  Recent Labs  10/15/12 1312  PROBNP 7041.0*     Imaging: No results found.   Medications:     Scheduled Medications: . amiodarone  400 mg Oral Daily  . carvedilol  3.125 mg Oral BID WC  . enoxaparin (LOVENOX) injection  1 mg/kg  Subcutaneous Q12H  . famotidine  20 mg Oral BID  . furosemide  80 mg Oral Daily  . multivitamin with minerals  1 tablet Oral Daily  . QUEtiapine  50 mg Oral QHS  . traZODone  50 mg Oral QHS  . Warfarin - Pharmacist Dosing Inpatient   Does not apply q1800    Infusions: . sodium chloride 10 mL/hr (10/25/12 0800)    PRN Medications: sodium chloride, acetaminophen, alum & mag hydroxide-simeth, levalbuterol, ondansetron (ZOFRAN) IV   Assessment:   1) A/C systolic HF  2) NICM, s/p CRT-D ([employer])  3) Atrial fibrillation, RVR  4) Anticoagulation  5) Respiratory failure  6) Left atrial appendage thrombus  7) Hypokalemia  8) A/C kidney disease, stage 3, likely due to cardiorenal syndrome  9) Elevated AST/ALT - likely shock liver  10) Severe protein calorie malnutrition   Plan/Discussion:    Now back in NSR. (absolutely needs NSR and CRT to stay out of HF) Euvolemic. LFTs have normalized (up due to shock). INR therapeutic. Supp K+  Will send home today on following meds.  Amio 400 bid x 2 weeks then 400 daily Asa 81 Carvedilol 6.25 bid Lasix 40 mg daily (take extra 40 mg if weight 209 or greater) Spiro 12.5 daily Crestor 10 Allopurinol 300 daily Lisinopril 10 mg daily (replaces captopril) Warfarin Namenda  F/u in HF Clinic next week to continue med titration. We will get appt on chart this am.   F/u with EP in 2 weeks.  Daniel Bensimhon,MD 7:55 AM   Length of Stay: 11

## 2012-10-26 NOTE — Progress Notes (Signed)
ANTICOAGULATION CONSULT NOTE - Follow Up Consult  Pharmacy Consult for Warfarin / Lovenox Indication: atrial fibrillation  No Known Allergies  Patient Measurements: Height: 5\' 10"  (177.8 cm) Weight: 206 lb 1.6 oz (93.486 kg) IBW/kg (Calculated) : 73   Vital Signs: Temp: 97.8 F (36.6 C) (10/21 0325) Temp src: Oral (10/21 0325) BP: 116/88 mmHg (10/21 0325)  Labs:  Recent Labs  10/24/12 0530 10/25/12 0448 10/26/12 0450  HGB  --   --  13.6  HCT  --   --  41.0  PLT  --   --  175  LABPROT 21.3* 20.0* 22.3*  INR 1.91* 1.76* 2.03*  CREATININE 1.52* 1.40* 1.23    Estimated Creatinine Clearance: 59.6 ml/min (by C-G formula based on Cr of 1.23).    Assessment: 77yom admitted with HF EF 20% and A-FIb RVR > DCCV > SR 10/20 on amiodarone.  His  INR was supratherapeutic on admit but then fell  to < 2.  INR 1.76 - begin enoxaparin 1mg /kg = 95mg  q12.  S/p Coumadin increased dose yesterday 7.5mg  x1 and INR 2.0.   No bleeding noted, CBC stable.   LFT elevated on admit now wnl.  Goal of Therapy:  INR 2-3 Monitor platelets by anticoagulation protocol: Yes   Plan:  Warfarin home dose 5mg  MWFSS/ 2.5mg  TuTh Lovenox 95mg  x1 dose today prior to d/c D/c today recheck INR next week  Leota Sauers Pharm.D. CPP, BCPS Clinical Pharmacist [phone] 10/26/2012 8:04 AM

## 2012-10-26 NOTE — Progress Notes (Signed)
Pt d/c'd home with wife. Both verbalize understanding of all discharge instructions. Prescription for aspirin given and two other prescriptions changed to [facility] on [address] per pt request. All belongings with pt. Pt denies CP, SOB, or any other complaints.

## 2012-10-26 NOTE — Discharge Summary (Signed)
CARDIOLOGY DISCHARGE SUMMARY    Patient ID: Jon Butler,  MRN: [id number], DOB/AGE: Jul 10, 1935 77 y.o.  Admit date: 10/15/2012 Discharge date: 10/26/2012  Primary Care Physician: Georgann Housekeeper, MD  Primary Cardiologist / Primary EP: Gala Romney, MD / Ladona Ridgel, MD  Primary Discharge Diagnosis:  1. A/C systolic HF, improved  2. NICM, s/p CRT-D (Medtronic)  3. Atrial fibrillation with RVR  4. Acute respiratory failure, improved  5. Left atrial appendage thrombus  6. Hypokalemia, improved 7. A/C kidney disease, stage 3, likely due to cardiorenal syndrome  8. Elevated AST/ALT - likely shock liver  9. Severe protein calorie malnutrition  Secondary Discharge Diagnoses:  1. Valvular heart disease s/p mechanical AVR 2. History of non-compliance  Procedures This Admission:  1. Transesophageal echo 10/15/2012 Left ventricle: LVEF is severely depressed. ------------------------------------------------------------ Aortic valve: AV prosthesis is difficult to see well. There does not appear to be any vegetation associated with valve No AI. ------------------------------------------------------------ Aorta: Minimal plaquing in thoracic aorta. ------------------------------------------------------------ Mitral valve: MV is mildly thickened. Moderate MR that is directed posteriorly. ------------------------------------------------------------ Left atrium: LA appendage with echodensities near tip that are consisten with thrombus. ------------------------------------------------------------ Right ventricle: Pacer wire or catheter noted in right ventricle. ------------------------------------------------------------ Tricuspid valve: TV is mildly thickened No definite vegetation. Mild TR. ------------------------------------------------------------ Right atrium: Defibrillator wires present in RA There are mobile echodensities attached to wires that are consistent with vegetations. Cannot  completely exclude thrombus though unusual location. ------------------------------------------------------------ 2. DCCV 10/25/2012 Findings: After informed consent was obtained, the patient was prepped in the usual manner. The electrodispersive pad was placed in the AP position. 80 mg of IV propafol was given intravenously. 200 joules of synch biphasic energy was given shocking the patient to atrial tachycardia and he was paced back to NSR by pacing the atria at 500 ms. He tolerated the procedure well and there was no immediate complications.  Complications: None immediately.  History: From admission H&P, Jon Butler is a 77 year old man with a nonischemic cardiomyopathy, left bundle branch block, status post biventricular ICD implantation with recent ICD generator change. He has developed atrial fibrillation and worsening heart failure. He has a history of noncompliance. He has been placed on anticoagulation with Coumadin. He had 2 weeks of therapeutic anticoagulation. Prior to this, his anticoagulation has been somewhat therapeutic. He has had worsening heart failure symptoms, requiring escalating doses of Lasix. He was brought into the hospital today to undergo transesophageal echo guided cardioversion. He was found to have a small clot in his left atrial appendage. He was also noted to have a mass on his defibrillator lead of uncertain clinical significance. Post procedure, the patient developed respiratory difficulty requiring suctioning and bronchodilator therapy. He has had volume overload for several weeks. He was admitted to the hospital for intravenous Lasix and additional bronchodilator therapy. He did not receive cardioversion because of the left atrial appendage thrombus present.  Hospital Course:  Jon Butler was admitted to the CCU. He was ultimately intubated secondary to acute respiratory failure following TEE. He developed cardiogenic shock with shock liver and kidney. He was placed on  milrinone an diuresed using IV Lasix. He was extubated on 10/17/2012. Remained on PO amiodarone with rates 100-115 bpm. Milrinone and IV lasix stopped on Sunday 10/24/2012. On 10/25/2012 he underwent successful DCCV after further amio load. INR now 2.03. Weight down another 3 pounds (26 pounds total). He remains hemodynamically stable. He remains in SR and is feeling better. He has been seen, examined and deemed stable for discharge today by  Dr. Gala Romney. There were several changes made to his medications. Please see Dr. Prescott Gum progress note and AVS for full details. Jon Butler will follow-up in the HF clinic next week for further medication titration. He will see Dr. Ladona Ridgel in 2 weeks.  Discharge Vitals: Blood pressure 116/88, pulse 75, temperature 97.8 F (36.6 C), temperature source Oral, resp. rate 19, height 5\' 10"  (1.778 m), weight 206 lb 1.6 oz (93.486 kg), SpO2 95.00%.   Labs: Lab Results  Component Value Date   WBC 8.3 10/26/2012   HGB 13.6 10/26/2012   HCT 41.0 10/26/2012   MCV 97.6 10/26/2012   PLT 175 10/26/2012    Recent Labs Lab 10/26/12 0450  NA 137  K 3.0*  CL 93*  CO2 36*  BUN 26*  CREATININE 1.23  CALCIUM 8.7  PROT 5.9*  BILITOT 1.3*  ALKPHOS 90  ALT 122*  AST 53*  GLUCOSE 87    Recent Labs  10/26/12 0450  INR 2.03*    Disposition:  The patient is being discharged in stable condition.  Follow-up: Follow-up Information   Follow up with [facility] On 11/01/2012. (At 3:40 PM)    Specialty:  Cardiology   Contact information:   [address] [id number] [place] [address] [phone]      Follow up with Lewayne Bunting, MD On 11/16/2012. (At 9:30 AM)    Specialty:  Cardiology   Contact information:   [address] [place] [address] [phone]      Discharge Medications:    Medication List    STOP taking these medications       captopril 50 MG tablet    Commonly known as:  CAPOTEN     RED YEAST RICE PO      TAKE these medications       allopurinol 300 MG tablet  Commonly known as:  ZYLOPRIM  Take 300 mg by mouth daily.     amiodarone 400 MG tablet  Commonly known as:  PACERONE  Take 1 tablet (400 mg) by mouth twice daily for 2 weeks then take 1 tablet (400 mg) once daily thereafter     amoxicillin 500 MG capsule  Commonly known as:  AMOXIL  Take 500 mg by mouth every other day.     aspirin EC 81 MG tablet  Take 1 tablet (81 mg total) by mouth daily.     carvedilol 3.125 MG tablet  Commonly known as:  COREG  Take 2 tablets (6.25 mg total) by mouth 2 (two) times daily with a meal.     CO Q-10 PO  Take by mouth daily.     colchicine 0.6 MG tablet  Take 0.6 mg by mouth as needed.     furosemide 40 MG tablet  Commonly known as:  LASIX  Take 1 tablet (40 mg total) by mouth daily. Take an extra dose of 40 mg if weight 209 lbs or greater.     lisinopril 10 MG tablet  Commonly known as:  PRINIVIL,ZESTRIL  Take 1 tablet (10 mg total) by mouth daily.     memantine 5 MG tablet  Commonly known as:  NAMENDA  Take 5 mg by mouth daily.     NAMENDA XR 28 MG Cp24  Generic drug:  Memantine HCl ER  Take 28 mg by mouth daily.     multivitamin with minerals tablet  Take 1 tablet by mouth daily.     rosuvastatin 10  MG tablet  Commonly known as:  CRESTOR  Take 10 mg by mouth daily.     spironolactone 12.5 mg Tabs tablet  Commonly known as:  ALDACTONE  Take 0.5 tablets (12.5 mg total) by mouth daily.     traZODone 50 MG tablet  Commonly known as:  DESYREL  Take 50 mg by mouth at bedtime.     warfarin 5 MG tablet  Commonly known as:  COUMADIN  Take 2.5-5 mg by mouth daily. 5mg  SuMoFrSa; 2.5mg  Th; take none TuThu.       Duration of Discharge Encounter: Greater than 30 minutes including physician time.  Signed, Rick Duff, PA-C 10/26/2012, 10:08 AM  Patient seen and examined. C/C summary reviewed.. I agree with  the assessment and plan as stated above. Jon Butler developed worsening HF in the setting of recurrent AF and severe cardiomyopathy with the loss of CRT. He decompensated at the time of his TEE and attempted DC-CV and required intubation and inotropic support. He recovered well with milrinone and lasix and underwent DC-CV prior to discharge. Given need to maintain NSR he was placed on amio. He will f/u in HF and EP clinics.  Daniel Bensimhon,MD 11:41 AM

## 2012-10-26 NOTE — Progress Notes (Signed)
[id number] Reviewed green,yellow and red zones with pt and wife and when to call MD with weight gain. Gave low sodium diets and stressed 2000mg  restriction. Gave ex guidelines and discouraged treadmill for at least a couple of weeks due to safety. Encouraged pt to walk in house to begin and increase distance daily. Discussed CRP 2 and wife gave permission to refer to see if Medicare will cover. Duanne Limerick, RN  BSN 10/26/2012  12:10 PM

## 2012-10-27 ENCOUNTER — Telehealth: Payer: Self-pay | Admitting: Internal Medicine

## 2012-10-27 ENCOUNTER — Other Ambulatory Visit: Payer: Self-pay | Admitting: Pharmacist

## 2012-10-27 MED ORDER — SPIRONOLACTONE 25 MG PO TABS: 12.5000 mg | ORAL_TABLET | Freq: Every day | ORAL | Status: DC

## 2012-10-27 NOTE — Telephone Encounter (Deleted)
Error

## 2012-10-28 DIAGNOSIS — Z9119 Patient's noncompliance with other medical treatment and regimen: Secondary | ICD-10-CM | POA: Diagnosis not present

## 2012-10-28 DIAGNOSIS — I509 Heart failure, unspecified: Secondary | ICD-10-CM | POA: Diagnosis not present

## 2012-10-28 DIAGNOSIS — I4891 Unspecified atrial fibrillation: Secondary | ICD-10-CM | POA: Diagnosis not present

## 2012-10-28 DIAGNOSIS — I447 Left bundle-branch block, unspecified: Secondary | ICD-10-CM | POA: Diagnosis not present

## 2012-10-28 DIAGNOSIS — I38 Endocarditis, valve unspecified: Secondary | ICD-10-CM | POA: Diagnosis not present

## 2012-10-28 DIAGNOSIS — E119 Type 2 diabetes mellitus without complications: Secondary | ICD-10-CM | POA: Diagnosis not present

## 2012-10-28 DIAGNOSIS — Z7901 Long term (current) use of anticoagulants: Secondary | ICD-10-CM | POA: Diagnosis not present

## 2012-10-28 DIAGNOSIS — I428 Other cardiomyopathies: Secondary | ICD-10-CM | POA: Diagnosis not present

## 2012-10-28 DIAGNOSIS — I5023 Acute on chronic systolic (congestive) heart failure: Secondary | ICD-10-CM | POA: Diagnosis not present

## 2012-10-29 ENCOUNTER — Ambulatory Visit (INDEPENDENT_AMBULATORY_CARE_PROVIDER_SITE_OTHER): Payer: Medicare Other | Admitting: *Deleted

## 2012-10-29 ENCOUNTER — Encounter (INDEPENDENT_AMBULATORY_CARE_PROVIDER_SITE_OTHER): Payer: Self-pay

## 2012-10-29 DIAGNOSIS — I4891 Unspecified atrial fibrillation: Secondary | ICD-10-CM

## 2012-10-29 DIAGNOSIS — Z5181 Encounter for therapeutic drug level monitoring: Secondary | ICD-10-CM | POA: Diagnosis not present

## 2012-10-29 DIAGNOSIS — Z95810 Presence of automatic (implantable) cardiac defibrillator: Secondary | ICD-10-CM | POA: Diagnosis not present

## 2012-10-29 DIAGNOSIS — Z7901 Long term (current) use of anticoagulants: Secondary | ICD-10-CM

## 2012-10-29 LAB — POCT INR: INR: 3.1

## 2012-10-29 NOTE — Patient Instructions (Addendum)
A full discussion of the nature of anticoagulants has been carried out.  A benefit risk analysis has been presented to the patient, so that they understand the justification for choosing anticoagulation at this time. The need for frequent and regular monitoring, precise dosage adjustment and compliance is stressed.  Side effects of potential bleeding are discussed.  The patient should avoid any OTC items containing aspirin or ibuprofen, and should avoid great swings in general diet.  Avoid alcohol consumption.  Call if any signs of abnormal bleeding.  1.5 Gram Low Sodium Diet A 1.5 gram sodium diet restricts the amount of salt in your diet. You can have no more than 1.5 grams (1500 miligrams) in 1 day. This can help lessen your risk for developing high blood pressure. This diet may also reduce your chance of having a heart attack or stroke. It is important that you know what to look for when choosing foods and drinks.  HOME CARE   Do not add salt to food.  Avoid convenience items and fast food.  Choose unsalted snack foods.  Buy products labeled "low sodium" or "no salt added" when possible.  Check food labels to learn how much sodium is in 1 serving.  When eating at a restaurant, ask that your food be prepared with less salt or none, if possible. The nutrition facts label is a good place to find how much sodium is in foods. Look for products with no more than 400 mg of sodium per serving. Remember that 1.5 g = 1500 mg. CHOOSING FOODS Grains  Avoid: Salted crackers and snack items. Some cereals, including instant hot cereals. Bread stuffing and biscuit mixes. Seasoned rice or pasta mixes.  Choose: Unsalted snack items. Low-sodium cereals, oats, puffed wheat and rice, shredded wheat. English muffins and bread. Pasta. Meats  Avoid:  Salted, canned, smoked, spiced, pickled meats, including fish and poultry. Bacon, ham, sausage, cold cuts, hot dogs, anchovies.  Choose: Low-sodium canned tuna  and salmon. Fresh or frozen meat, poultry, and fish. Dairy  Avoid: Processed cheese and spreads. Cottage cheese. Buttermilk and condensed milk. Regular cheese.  Choose:  Milk. Low-sodium cottage cheese. Yogurt. Sour cream. Low-sodium cheese. Fruits and Vegetables  Avoid:  Regular canned vegetables. Regular canned tomato sauce and paste. Frozen vegetables in sauces. Olives. Rosita Fire. Relishes. Sauerkraut.  Choose:  Low-sodium canned vegetables. Low-sodium tomato sauce and paste. Frozen or fresh vegetables. Fresh and frozen fruit. Condiments  Avoid:  Canned and packaged gravies. Worcestershire sauce. Tartar sauce. Barbecue sauce. Soy sauce. Steak sauce. Ketchup. Onion, garlic, and table salt. Meat flavorings and tenderizers.  Choose:  Fresh and dried herbs and spices. Low-sodium varieties of mustard and ketchup. Lemon juice. Tabasco sauce. Horseradish. Document Released: 01/25/2010 Document Revised: 03/17/2011 Document Reviewed: 01/25/2010 [facility] Patient Information 2014 [place], [place]. Fat and Cholesterol Control Diet Cholesterol is a wax-like substance. It comes from your liver and is found in certain foods. There is good (HDL) and bad (LDL) cholesterol. Too much cholesterol in your blood can affect your heart. Certain foods can lower or raise your cholesterol. Eat foods that are low in cholesterol. Saturated and trans fats are bad fats found in foods that will raise your cholesterol. Do not eat foods that are high in saturated and trans fats. FOODS HIGHER IN SATURATED AND TRANS FATS  Dairy products, such as whole milk, eggs, cheese, cream, and butter.  Fatty meats, such as hot dogs, sausage, and salami.  Fried foods.  Trans fats which are found in margarine and  pre-made cookies, crackers, and baked goods.  Tropical oils, such as coconut and palm oils. Read package labels at the store. Do not buy products that use saturated or trans fats or hydrogenated oils. Find foods  labeled:  Low-fat.  Low-saturated fat.  Trans-fat-free.  Low-cholesterol. FOODS LOWER IN CHOLESTEROL   Fruit.  Vegetables.  Beans, peas, and lentils.  Fish.  Lean meat, such as chicken (without skin) or ground [place].  Grains, such as barley, rice, couscous, bulgur wheat, and pasta.  Heart-healthy tub margarine. PREPARING YOUR FOOD  Broil, bake, steam, or roast foods. Do not fry food.  Use non-stick cooking sprays.  Use lemon or herbs to flavor food instead of using butter or stick margarine.  Use nonfat yogurt, salsa, or low-fat dressings for salads. Document Released: 06/24/2011 Document Reviewed: 06/24/2011 [facility] Patient Information 2014 [place], [place].

## 2012-11-01 ENCOUNTER — Ambulatory Visit (INDEPENDENT_AMBULATORY_CARE_PROVIDER_SITE_OTHER): Payer: Medicare Other | Admitting: General Practice

## 2012-11-01 ENCOUNTER — Encounter (HOSPITAL_COMMUNITY): Payer: Self-pay

## 2012-11-01 ENCOUNTER — Ambulatory Visit (HOSPITAL_COMMUNITY)
Admit: 2012-11-01 | Discharge: 2012-11-01 | Disposition: A | Discharge status: Home / Self Care | Payer: Medicare Other | Source: Ambulatory Visit | Attending: Internal Medicine | Admitting: Internal Medicine

## 2012-11-01 VITALS — BP 88/64 | HR 68 | Ht 70.0 in | Wt 205.8 lb

## 2012-11-01 DIAGNOSIS — I5022 Chronic systolic (congestive) heart failure: Secondary | ICD-10-CM | POA: Diagnosis not present

## 2012-11-01 DIAGNOSIS — Z95810 Presence of automatic (implantable) cardiac defibrillator: Secondary | ICD-10-CM

## 2012-11-01 DIAGNOSIS — I4891 Unspecified atrial fibrillation: Secondary | ICD-10-CM | POA: Diagnosis not present

## 2012-11-01 DIAGNOSIS — Z7901 Long term (current) use of anticoagulants: Secondary | ICD-10-CM | POA: Diagnosis not present

## 2012-11-01 LAB — PROTIME-INR: INR: 8.6 ratio (ref 0.8–1.0)

## 2012-11-01 MED ORDER — AMIODARONE HCL 400 MG PO TABS: 400.0000 mg | ORAL_TABLET | Freq: Every day | ORAL | Status: DC

## 2012-11-01 NOTE — Progress Notes (Signed)
Patient ID: Jon Butler, male   DOB: [birth year], 77 y.o.   MRN: [id number]  Weight Range   Baseline proBNP   PCP: Dr Donette Larry EP: Dr Ladona Ridgel [facility] HPI: Jon Butler is a 77 yo male with a history of chronic systolic HF EF 20% due to NICM s/p CRT-D (Medtronic), mechanical AVR ([employer]), LBBB, PAF and non-compliance.  He recently developed Afib/flutter and was scheduled for TEE-DCCV on 09/20/12 at the same time when he had his CRT-D replacement for EOL, however his INR was 1.6 so cardioversion was postponed. (ICD generator was changed)  Admitted to [facility] 10/10 through 10/26/12 with ADHF in setting of recurrent AF. Developed cardiogenic shock and VDRF   BB and ACE-I held and amiodarone gtt was started. Required short term Milrinone and lasix.Diuresed 26 pounds and transitioned to 40 mg daily. DC-CV on 10/20. Remains on Coumadin. D/C weight 206 pounds.   He returns for post hospital follow up. Overall he feels ok. Denies SOB/PND/orthopnea. Says he is not doing much. No bleeding problems. Weight at home 199 pounds.  Taking all medications. Following low salt diet. Followed by [facility]. Drinking 1-2 glasses of wine daily.    11/01/12 Labs INR > 8 10/26/12 K 3.0 Creatinine 1.23   ROS: All systems negative except as listed in HPI, PMH and Problem List.  Past Medical History  Diagnosis Date  . Left bundle-branch block   . Cardiomyopathy   . Chronic systolic heart failure     Current Outpatient Prescriptions  Medication Sig Dispense Refill  . allopurinol (ZYLOPRIM) 300 MG tablet Take 300 mg by mouth daily.        Marland Kitchen amiodarone (PACERONE) 400 MG tablet Take 1 tablet (400 mg total) by mouth daily.  30 tablet  4  . amoxicillin (AMOXIL) 500 MG capsule Take 500 mg by mouth every other day.       Marland Kitchen aspirin EC 81 MG tablet Take 1 tablet (81 mg total) by mouth daily.      . carvedilol (COREG) 3.125 MG tablet Take 2 tablets (6.25 mg total) by mouth 2 (two) times daily with a meal.  120 tablet  4  .  Coenzyme Q10 (CO Q-10 PO) Take by mouth daily.        . colchicine 0.6 MG tablet Take 0.6 mg by mouth as needed.       . furosemide (LASIX) 40 MG tablet Take 1 tablet (40 mg total) by mouth daily. Take an extra dose of 40 mg if weight 209 lbs or greater.  30 tablet  4  . lisinopril (PRINIVIL,ZESTRIL) 10 MG tablet Take 1 tablet (10 mg total) by mouth daily.  30 tablet  4  . memantine (NAMENDA) 5 MG tablet Take 5 mg by mouth daily.      . Memantine HCl ER (NAMENDA XR) 28 MG CP24 Take 28 mg by mouth daily.      . Multiple Vitamins-Minerals (MULTIVITAMIN WITH MINERALS) tablet Take 1 tablet by mouth daily.        . rosuvastatin (CRESTOR) 10 MG tablet Take 10 mg by mouth daily.        Marland Kitchen spironolactone (ALDACTONE) 25 MG tablet Take 0.5 tablets (12.5 mg total) by mouth daily.  30 tablet  3  . traZODone (DESYREL) 50 MG tablet Take 50 mg by mouth at bedtime.        Marland Kitchen warfarin (COUMADIN) 5 MG tablet Take 2.5mg  on Wed and Fri and 5mg  all other days  45 tablet  3   No current facility-administered medications for this encounter.     PHYSICAL EXAM: Filed Vitals:   11/01/12 1541  BP: 88/64  Pulse: 68  Height: 5\' 10"  (1.778 m)  Weight: 205 lb 12.8 oz (93.35 kg)  SpO2: 97%    General:  Well appearing. No resp difficulty Wife present HEENT: normal Neck: supple. JVP flat. Carotids 2+ bilaterally; no bruits. No lymphadenopathy or thryomegaly appreciated. Cor: PMI normal. Regular rate & rhythm. No rubs, gallops 2/6 TR. Lungs: clear Abdomen: soft, nontender, nondistended. No hepatosplenomegaly. No bruits or masses. Good bowel sounds. Extremities: no cyanosis, clubbing, rash, edema Neuro: alert & orientedx3, cranial nerves grossly intact. Moves all 4 extremities w/o difficulty. Affect pleasant.  EKG: AV paced 64 bpm   ASSESSMENT & PLAN: 1. Chronic Systolic Heart Failure EF 20%  S/P Medtronic CRT-D 09/2012  He is much improved after DC-CV NYHA II-III. Volume status continues to go down. Dry weight at  home 199 pounds. BP soft so suspect on the dry side.   Continue lasix 40 mg daily and 12.5 mg spironolactone daily. Told him to hold lasix when weight goes below 198. Continue carvedilol 6.25 mg twice a day. Continue lisinopril 10 mg daily Reinforced daily weights, low salt food choices, and limiting fluid intake to < 2 liters per day.  Discussed sliding scale diuretics. Instructed to take an additional 40 mg lasix if his weight goes up 3 pounds or 5 pounds in 1 week.  Check BMET on Wednesday. [facility] to draw and fax results.   2. A Fib- Decrease amiodarone to 400 mg twice a day. INR today at coumadin clinic with POC  >8. Lab draw pending. No obvious bleeding Coumadin on hold per [facility]in Clinic. I spoke to [facility]in Clinic personally. Will consider vit k as needed.   3. CKD stage 3. [facility] to check BMET on Wednesday  4. LBBB - Has medtronic CRT-D   5. Alcohol- Reports drinking 1-2 glass of wine a day. Reinforced alcohol abstinence.   Follow up in 3 weeks  Truman Hayward 11:33 PM

## 2012-11-01 NOTE — Patient Instructions (Signed)
Follow up in 3 weeks  Take amiodarone 400 mg daily  Do the following things EVERYDAY: 1) Weigh yourself in the morning before breakfast. Write it down and keep it in a log. 2) Take your medicines as prescribed 3) Eat low salt foods-Limit salt (sodium) to 2000 mg per day.  4) Stay as active as you can everyday 5) Limit all fluids for the day to less than 2 liters

## 2012-11-03 NOTE — Addendum Note (Signed)
Encounter addended by: Sanda Linger, CCT on: 11/03/2012  9:46 AM<BR>     Documentation filed: Charges VN

## 2012-11-04 ENCOUNTER — Ambulatory Visit (INDEPENDENT_AMBULATORY_CARE_PROVIDER_SITE_OTHER): Payer: Medicare Other | Admitting: General Practice

## 2012-11-04 DIAGNOSIS — I509 Heart failure, unspecified: Secondary | ICD-10-CM | POA: Diagnosis not present

## 2012-11-04 DIAGNOSIS — Z95810 Presence of automatic (implantable) cardiac defibrillator: Secondary | ICD-10-CM | POA: Diagnosis not present

## 2012-11-04 DIAGNOSIS — I4891 Unspecified atrial fibrillation: Secondary | ICD-10-CM

## 2012-11-04 DIAGNOSIS — I428 Other cardiomyopathies: Secondary | ICD-10-CM | POA: Diagnosis not present

## 2012-11-04 DIAGNOSIS — I38 Endocarditis, valve unspecified: Secondary | ICD-10-CM | POA: Diagnosis not present

## 2012-11-04 DIAGNOSIS — Z7901 Long term (current) use of anticoagulants: Secondary | ICD-10-CM

## 2012-11-04 DIAGNOSIS — I5023 Acute on chronic systolic (congestive) heart failure: Secondary | ICD-10-CM | POA: Diagnosis not present

## 2012-11-04 DIAGNOSIS — I447 Left bundle-branch block, unspecified: Secondary | ICD-10-CM | POA: Diagnosis not present

## 2012-11-04 LAB — POCT INR: INR: 7.2

## 2012-11-04 LAB — PROTIME-INR: INR: 7.3 ratio (ref 0.8–1.0)

## 2012-11-05 DIAGNOSIS — I447 Left bundle-branch block, unspecified: Secondary | ICD-10-CM | POA: Diagnosis not present

## 2012-11-05 DIAGNOSIS — I38 Endocarditis, valve unspecified: Secondary | ICD-10-CM | POA: Diagnosis not present

## 2012-11-05 DIAGNOSIS — I5023 Acute on chronic systolic (congestive) heart failure: Secondary | ICD-10-CM | POA: Diagnosis not present

## 2012-11-05 DIAGNOSIS — I509 Heart failure, unspecified: Secondary | ICD-10-CM | POA: Diagnosis not present

## 2012-11-05 DIAGNOSIS — I428 Other cardiomyopathies: Secondary | ICD-10-CM | POA: Diagnosis not present

## 2012-11-05 DIAGNOSIS — I4891 Unspecified atrial fibrillation: Secondary | ICD-10-CM | POA: Diagnosis not present

## 2012-11-08 ENCOUNTER — Ambulatory Visit (INDEPENDENT_AMBULATORY_CARE_PROVIDER_SITE_OTHER): Payer: Medicare Other | Admitting: General Practice

## 2012-11-08 DIAGNOSIS — Z7901 Long term (current) use of anticoagulants: Secondary | ICD-10-CM | POA: Diagnosis not present

## 2012-11-08 DIAGNOSIS — I4891 Unspecified atrial fibrillation: Secondary | ICD-10-CM

## 2012-11-08 DIAGNOSIS — Z95810 Presence of automatic (implantable) cardiac defibrillator: Secondary | ICD-10-CM

## 2012-11-08 LAB — POCT INR: INR: 1.9

## 2012-11-09 DIAGNOSIS — I447 Left bundle-branch block, unspecified: Secondary | ICD-10-CM | POA: Diagnosis not present

## 2012-11-09 DIAGNOSIS — I38 Endocarditis, valve unspecified: Secondary | ICD-10-CM | POA: Diagnosis not present

## 2012-11-09 DIAGNOSIS — I4891 Unspecified atrial fibrillation: Secondary | ICD-10-CM | POA: Diagnosis not present

## 2012-11-09 DIAGNOSIS — I5023 Acute on chronic systolic (congestive) heart failure: Secondary | ICD-10-CM | POA: Diagnosis not present

## 2012-11-09 DIAGNOSIS — I509 Heart failure, unspecified: Secondary | ICD-10-CM | POA: Diagnosis not present

## 2012-11-09 DIAGNOSIS — I428 Other cardiomyopathies: Secondary | ICD-10-CM | POA: Diagnosis not present

## 2012-11-11 DIAGNOSIS — I38 Endocarditis, valve unspecified: Secondary | ICD-10-CM | POA: Diagnosis not present

## 2012-11-11 DIAGNOSIS — I428 Other cardiomyopathies: Secondary | ICD-10-CM | POA: Diagnosis not present

## 2012-11-11 DIAGNOSIS — I4891 Unspecified atrial fibrillation: Secondary | ICD-10-CM | POA: Diagnosis not present

## 2012-11-11 DIAGNOSIS — I447 Left bundle-branch block, unspecified: Secondary | ICD-10-CM | POA: Diagnosis not present

## 2012-11-11 DIAGNOSIS — I5023 Acute on chronic systolic (congestive) heart failure: Secondary | ICD-10-CM | POA: Diagnosis not present

## 2012-11-11 DIAGNOSIS — I509 Heart failure, unspecified: Secondary | ICD-10-CM | POA: Diagnosis not present

## 2012-11-16 ENCOUNTER — Telehealth: Payer: Self-pay | Admitting: Internal Medicine

## 2012-11-16 ENCOUNTER — Encounter: Payer: Self-pay | Admitting: Internal Medicine

## 2012-11-16 ENCOUNTER — Ambulatory Visit (INDEPENDENT_AMBULATORY_CARE_PROVIDER_SITE_OTHER): Payer: Medicare Other | Admitting: Internal Medicine

## 2012-11-16 ENCOUNTER — Ambulatory Visit (INDEPENDENT_AMBULATORY_CARE_PROVIDER_SITE_OTHER): Payer: Medicare Other | Admitting: Pharmacist

## 2012-11-16 VITALS — BP 115/79 | HR 91 | Ht 70.0 in | Wt 208.6 lb

## 2012-11-16 DIAGNOSIS — Z95810 Presence of automatic (implantable) cardiac defibrillator: Secondary | ICD-10-CM | POA: Diagnosis not present

## 2012-11-16 DIAGNOSIS — I5023 Acute on chronic systolic (congestive) heart failure: Secondary | ICD-10-CM | POA: Diagnosis not present

## 2012-11-16 DIAGNOSIS — Z7901 Long term (current) use of anticoagulants: Secondary | ICD-10-CM | POA: Diagnosis not present

## 2012-11-16 DIAGNOSIS — I472 Ventricular tachycardia: Secondary | ICD-10-CM

## 2012-11-16 DIAGNOSIS — I4891 Unspecified atrial fibrillation: Secondary | ICD-10-CM

## 2012-11-16 DIAGNOSIS — I5022 Chronic systolic (congestive) heart failure: Secondary | ICD-10-CM

## 2012-11-16 LAB — MDC_IDC_ENUM_SESS_TYPE_INCLINIC
Battery Voltage: 3.01 V
Brady Statistic AP VP Percent: 3.07 %
Brady Statistic AP VS Percent: 0.02 %
Brady Statistic AS VP Percent: 81.96 %
Brady Statistic AS VS Percent: 14.94 %
Brady Statistic RA Percent Paced: 3.09 %
HighPow Impedance: 228 Ohm
HighPow Impedance: 59 Ohm
Lead Channel Impedance Value: 513 Ohm
Lead Channel Impedance Value: 722 Ohm
Lead Channel Pacing Threshold Pulse Width: 0.4 ms
Lead Channel Sensing Intrinsic Amplitude: 20.875 mV
Lead Channel Setting Pacing Amplitude: 2 V
Lead Channel Setting Pacing Amplitude: 2.5 V
Lead Channel Setting Pacing Amplitude: 3.5 V
Lead Channel Setting Pacing Pulse Width: 0.4 ms
Lead Channel Setting Pacing Pulse Width: 0.4 ms
Zone Setting Detection Interval: 320 ms
Zone Setting Detection Interval: 360 ms

## 2012-11-16 LAB — POCT INR: INR: 2.6

## 2012-11-16 MED ORDER — CARVEDILOL 3.125 MG PO TABS: 6.2500 mg | ORAL_TABLET | Freq: Two times a day (BID) | ORAL | Status: DC

## 2012-11-16 NOTE — Assessment & Plan Note (Signed)
He has had no recurrent sustained ventricular arrhythmias.

## 2012-11-16 NOTE — Telephone Encounter (Signed)
New message     Pt was told to call Brandy this afternoon. He saw Dr Ladona Ridgel this am.

## 2012-11-16 NOTE — Assessment & Plan Note (Signed)
His symptoms today are class II. He is on maximal medical therapy. We'll hope to be over to up titrate his medical therapy in the future as his blood pressure allows. He is instructed to maintain a low-sodium diet and stop drinking alcohol.

## 2012-11-16 NOTE — Assessment & Plan Note (Signed)
Interrogation of his ICD today demonstrates that approximately one week ago, the patient went back to atrial fibrillation. Fortunately, his ventricular rate has been under better control. The patient will main on amiodarone for now , and I would reconsider repeat cardioversion after amiodarone has been loaded.

## 2012-11-16 NOTE — Patient Instructions (Signed)
Your physician has recommended you make the following change in your medication:   Increase Coreg 6.25 mg twice daily.  Your physician recommends that you schedule a follow-up appointment in: 3 months with Dr. Ladona Ridgel.

## 2012-11-16 NOTE — Progress Notes (Signed)
HPI Jon Butler  Returns today for arrhythmia followup. He is a very pleasant 77 year old man with a nonischemic cardiomyopathy, chronic systolic heart failure , status post aortic valve replacement , and atrial fibrillation. He developed worsening heart failure and required mechanical ventilation after undergoing transesophageal echo. The patient ultimately was cardioverted and placed on fairly high dose amiodarone therapy. He improved. He was discharged home and his dry weight was thought to be around 205 pounds. He has done well in the interim and weighs in at home around 200 pounds. He denies noncompliance. Previously he had a history of noncompliance along with alcohol abuse. He denies alcohol abuse as well.  He is been maintained on amiodarone 400 mg daily.  No Known Allergies   Current Outpatient Prescriptions  Medication Sig Dispense Refill  . allopurinol (ZYLOPRIM) 300 MG tablet Take 300 mg by mouth daily.        Marland Kitchen amiodarone (PACERONE) 400 MG tablet Take 1 tablet (400 mg total) by mouth daily.  30 tablet  4  . amoxicillin (AMOXIL) 500 MG capsule Take 500 mg by mouth every other day.       Marland Kitchen aspirin EC 81 MG tablet Take 1 tablet (81 mg total) by mouth daily.      . carvedilol (COREG) 3.125 MG tablet Take 2 tablets (6.25 mg total) by mouth 2 (two) times daily with a meal.  120 tablet  4  . Coenzyme Q10 (CO Q-10 PO) Take by mouth daily.        . colchicine 0.6 MG tablet Take 0.6 mg by mouth as needed.       . furosemide (LASIX) 40 MG tablet Take 40 mg by mouth daily. Take an extra dose of 40 mg if weight 205 lbs or greater.      Marland Kitchen lisinopril (PRINIVIL,ZESTRIL) 10 MG tablet Take 1 tablet (10 mg total) by mouth daily.  30 tablet  4  . memantine (NAMENDA) 5 MG tablet Take 5 mg by mouth daily.      . Memantine HCl ER (NAMENDA XR) 28 MG CP24 Take 28 mg by mouth daily.      . Multiple Vitamins-Minerals (MULTIVITAMIN WITH MINERALS) tablet Take 1 tablet by mouth daily.        .  rosuvastatin (CRESTOR) 10 MG tablet Take 10 mg by mouth daily.        Marland Kitchen spironolactone (ALDACTONE) 25 MG tablet Take 0.5 tablets (12.5 mg total) by mouth daily.  30 tablet  3  . traZODone (DESYREL) 50 MG tablet Take 50 mg by mouth at bedtime.        Marland Kitchen warfarin (COUMADIN) 5 MG tablet Take as directed by anticoagulation clinic       No current facility-administered medications for this visit.     Past Medical History  Diagnosis Date  . Left bundle-branch block   . Cardiomyopathy   . Chronic systolic heart failure     ROS:   All systems reviewed and negative except as noted in the HPI.   Past Surgical History  Procedure Laterality Date  . Doppler echocardiography  2008, 2009  . Aortic valve replacement    . Tee without cardioversion N/A 10/15/2012    Procedure: TRANSESOPHAGEAL ECHOCARDIOGRAM (TEE);  Surgeon: Pricilla Riffle, MD;  Location: [facility] ENDOSCOPY;  Service: Cardiovascular;  Laterality: N/A;  . Cardioversion N/A 10/25/2012    Procedure: CARDIOVERSION;  Surgeon: Marinus Maw, MD;  Location: [facility] OR;  Service: Cardiovascular;  Laterality: N/A;  Family History  Problem Relation Age of Onset  . Aneurysm Mother     brain  . Heart attack Father 61     History   Social History  . Marital Status: Married    Spouse Name: N/A    Number of Children: N/A  . Years of Education: N/A   Occupational History  . Not on file.   Social History Main Topics  . Smoking status: Never Smoker   . Smokeless tobacco: Not on file  . Alcohol Use: 0.0 oz/week    2-3 Glasses of wine per week     Comment: 2-3 glasses  . Drug Use: No  . Sexual Activity: Not on file   Other Topics Concern  . Not on file   Social History Narrative  . No narrative on file     BP 115/79  Pulse 91  Ht 5\' 10"  (1.778 m)  Wt 208 lb 9.6 oz (94.62 kg)  BMI 29.93 kg/m2  Physical Exam:  Well appearing  77 year old man, NAD HEENT: Unremarkable Neck:  No JVD, no thyromegally Back:  No CVA  tenderness Lungs:  Clear  With no wheezes, rales, or rhonchi. HEART:  IRegular rate rhythm, no murmurs, no rubs, no clicks Abd:  soft, positive bowel sounds, no organomegally, no rebound, no guarding Ext:  2 plus pulses, no edema, no cyanosis, no clubbing Skin:  No rashes no nodules Neuro:  CN II through XII intact, motor grossly intact   DEVICE  Normal device function.  See PaceArt for details.   Assess/Plan:

## 2012-11-17 DIAGNOSIS — I509 Heart failure, unspecified: Secondary | ICD-10-CM | POA: Diagnosis not present

## 2012-11-17 DIAGNOSIS — I38 Endocarditis, valve unspecified: Secondary | ICD-10-CM | POA: Diagnosis not present

## 2012-11-17 DIAGNOSIS — I447 Left bundle-branch block, unspecified: Secondary | ICD-10-CM | POA: Diagnosis not present

## 2012-11-17 DIAGNOSIS — I5023 Acute on chronic systolic (congestive) heart failure: Secondary | ICD-10-CM | POA: Diagnosis not present

## 2012-11-17 DIAGNOSIS — I4891 Unspecified atrial fibrillation: Secondary | ICD-10-CM | POA: Diagnosis not present

## 2012-11-17 DIAGNOSIS — I428 Other cardiomyopathies: Secondary | ICD-10-CM | POA: Diagnosis not present

## 2012-11-19 NOTE — Telephone Encounter (Signed)
Follow Up  Pt requests a call back in reference to medication (Zestrill)// she states that she has additional information to deliver.

## 2012-11-19 NOTE — Telephone Encounter (Signed)
Should be taking lisinopril. Stop captopril.

## 2012-11-19 NOTE — Telephone Encounter (Signed)
Jon Butler called to let us know he is not taking Catopril 5mg . States when he was in hospital they gave him a list of medications and that was not included. States when they were in office on 11/11 was asked if he was taking medication and said she would call back. They are seeing Dr. Gala Romney on Mon 11/17 and Jon Butler ask him if he should be taking.  Is taking Lisinopril  10 mg daily.  Jon Butler forward to Dr. Gala Romney.

## 2012-11-19 NOTE — Telephone Encounter (Signed)
Notified wife that Dr. Gala Romney wants pt to take Lisinopril not Captopril.  She verbalizes understanding.

## 2012-11-22 ENCOUNTER — Ambulatory Visit (HOSPITAL_COMMUNITY)
Admission: RE | Admit: 2012-11-22 | Discharge: 2012-11-22 | Disposition: A | Discharge status: Home / Self Care | Payer: Medicare Other | Source: Ambulatory Visit | Attending: Internal Medicine | Admitting: Internal Medicine

## 2012-11-22 ENCOUNTER — Encounter (HOSPITAL_COMMUNITY): Payer: Self-pay

## 2012-11-22 VITALS — BP 98/60 | HR 70 | Ht 70.0 in | Wt 203.1 lb

## 2012-11-22 DIAGNOSIS — I4891 Unspecified atrial fibrillation: Secondary | ICD-10-CM

## 2012-11-22 DIAGNOSIS — I5022 Chronic systolic (congestive) heart failure: Secondary | ICD-10-CM | POA: Diagnosis not present

## 2012-11-22 LAB — BASIC METABOLIC PANEL
CO2: 27 mEq/L (ref 19–32)
Calcium: 9.1 mg/dL (ref 8.4–10.5)
Creatinine, Ser: 1.02 mg/dL (ref 0.50–1.35)
Glucose, Bld: 116 mg/dL — ABNORMAL HIGH (ref 70–99)
Sodium: 138 mEq/L (ref 135–145)

## 2012-11-22 MED ORDER — AMIODARONE HCL 400 MG PO TABS: 400.0000 mg | ORAL_TABLET | Freq: Two times a day (BID) | ORAL | Status: DC

## 2012-11-22 NOTE — Progress Notes (Signed)
Patient ID: Jon Butler, male   DOB: 06/15/1935, 77 y.o.   MRN: [id number]   Weight Range   Baseline proBNP    PCP: Dr Husain EP: Dr [facility] HPI: Jon Butler is a 77 yo male with a history of chronic systolic HF EF 20% due to NICM s/p CRT-D (Medtronic), mechanical AVR (St Jude), LBBB, PAF and non-compliance.  He recently developed Afib/flutter and was scheduled for TEE-DCCV on 09/20/12 at the same time when he had his CRT-D replacement for EOL, however his INR was 1.6 so cardioversion was postponed. (ICD generator was changed)  Admitted to [facility] 10/10 through 10/26/12 with ADHF in setting of recurrent AF. Developed cardiogenic shock and VDRF   BB and ACE-I held and amiodarone gtt was started. Required short term Milrinone and lasix.Diuresed 26 pounds and transitioned to 40 mg daily. DC-CV on 10/20. Remains on Coumadin. D/C weight 206 pounds.   Follow up: Saw Dr.Taylor last week and was back in Afib with better ventricular rate control. Denies SOB, orthopnea, CP or edema. Taking medications as prescribed. Weight at home stable at 200-203 lbs. Reports he can go a block walking the dog with no SOB, however family is shaking their head not accurate (probably 50 yds) No bleeding problems. Drinking 2 glasses of wine a day. SBP 90-120s. HR 50-80s.  11/01/12 Labs INR > 8 10/26/12 K 3.0 Creatinine 1.23   ROS: All systems negative except as listed in HPI, PMH and Problem List.  Past Medical History  Diagnosis Date  . Left bundle-branch block   . Cardiomyopathy   . Chronic systolic heart failure     Current Outpatient Prescriptions  Medication Sig Dispense Refill  . allopurinol (ZYLOPRIM) 300 MG tablet Take 300 mg by mouth daily.        . amiodarone (PACERONE) 400 MG tablet Take 1 tablet (400 mg total) by mouth daily.  30 tablet  4  . amoxicillin (AMOXIL) 500 MG capsule Take 500 mg by mouth every other day.       . aspirin EC 81 MG tablet Take 1 tablet (81 mg total) by mouth  daily.      . carvedilol (COREG) 3.125 MG tablet Take 2 tablets (6.25 mg total) by mouth 2 (two) times daily with a meal.  120 tablet  4  . Coenzyme Q10 (CO Q-10 PO) Take by mouth daily.        . colchicine 0.6 MG tablet Take 0.6 mg by mouth as needed.       . furosemide (LASIX) 40 MG tablet Take 40 mg by mouth daily. Take an extra dose of 40 mg if weight 205 lbs or greater.      . lisinopril (PRINIVIL,ZESTRIL) 10 MG tablet Take 1 tablet (10 mg total) by mouth daily.  30 tablet  4  . Memantine HCl ER (NAMENDA XR) 28 MG CP24 Take 28 mg by mouth daily.      . Multiple Vitamins-Minerals (MULTIVITAMIN WITH MINERALS) tablet Take 1 tablet by mouth daily.        . rosuvastatin (CRESTOR) 10 MG tablet Take 10 mg by mouth daily.        . spironolactone (ALDACTONE) 25 MG tablet Take 0.5 tablets (12.5 mg total) by mouth daily.  30 tablet  3  . traZODone (DESYREL) 50 MG tablet Take 50 mg by mouth at bedtime.        . warfarin (COUMADIN) 5 MG tablet Take as directed by anticoagulation clinic         No current facility-administered medications for this encounter.   Filed Vitals:   11/22/12 1446  BP: 98/60  Pulse: 70  Height: 5' 10" (1.778 m)  Weight: 203 lb 1.9 oz (92.135 kg)  SpO2: 96%   PHYSICAL EXAM: General:  Elderly. No resp difficulty Wife present HEENT: normal Neck: supple. JVP flat. Carotids 2+ bilaterally; no bruits. No lymphadenopathy or thryomegaly appreciated. Cor: PMI normal. Irregular rate & rhythm. Mechanical s2 No rubs, gallops 2/6 TR. Lungs: clear Abdomen: soft, nontender, nondistended. No hepatosplenomegaly. No bruits or masses. Good bowel sounds. Extremities: no cyanosis, clubbing, rash, edema Neuro: alert & orientedx3, cranial nerves grossly intact. Moves all 4 extremities w/o difficulty. Affect pleasant.  ASSESSMENT & PLAN: 1. Chronic Systolic Heart Failure EF 20%  S/P Medtronic CRT-D 09/2012  - Unfortunately Jon Butler is back in atrial fibrillation. Will increase amiodarone  back to 400 mg BID. Will need more therapeutic INRs before I would like to try and cardiovert him again. He is more rate controlled currently, however has lost about 10-15% of his pacing d/t being back in afib. Concerned if he remains if fib for a long period of time that his HF will be exacerbated.  - NYHA III symptoms and volume status stable. Will continue lasix 40 mg daily. No crossings over threshold on Optivol. - Continue current BB, ACE-I and Spiro dose. - Encouraged to increase his activity and being involved in CR, which starts tomorrow will help.  - Reinforced the need and importance of daily weights, a low sodium diet, and fluid restriction (less than 2 L a day). Instructed to call the HF clinic if weight increases more than 3 lbs overnight or 5 lbs in a week.   2. A Fib- As above he is back in afib. Will increase amiodarone to 400 mg BID and will see back in 3 weeks to schedule DC-CV if INR has been therapeutic for 4 consecutive weeks. Wife and son present and informed all to call Coumadin clinic to let them know of Amiodarone dose change.   3. CKD stage 3- stable will continue to follow  4. Alcohol- Reports drinking 1-2 glass of wine a day. Reinforced alcohol abstinence especially with coumadin. His INRs have not been consistent.   F/U 3 weeks  Cosgrove, Ali B, NP-C 2:54 PM  Patient seen and examined with Ali Cosgrove, NP. ICD diagnostics reviewed personally. We discussed all aspects of the encounter. I agree with the assessment and plan as stated above.  His HF is improved. Volume status looks good on exam and by Optivol. Unfortunately he is back in AF and I think his HF will deteriorate with loss of LV pacing in this setting, Agree with increasing amio and planning repeat DC-CV once INR therapeutic for 1 month. (Has AVR so can't switch to NOAC).   Ongoing HF education provided including reinforcing need for daily weights and reviewed use of sliding scale diuretics. BP too low to  titrate HF meds further at this point.   I continue to be concerned about his motivation to take responsibility for his own care.    Daniel Bensimhon,MD 5:50 PM     

## 2012-11-22 NOTE — Patient Instructions (Addendum)
Increase Amiodarone back to 400 mg twice a day.  Start trying to do more activity.  Follow up in 3 weeks.   If weight greater than 204 take an extra dose of lasix.  Call any issues.  Do the following things EVERYDAY: 1) Weigh yourself in the morning before breakfast. Write it down and keep it in a log. 2) Take your medicines as prescribed 3) Eat low salt foods-Limit salt (sodium) to 2000 mg per day.  4) Stay as active as you can everyday 5) Limit all fluids for the day to less than 2 liters 6)

## 2012-11-23 ENCOUNTER — Telehealth: Payer: Self-pay | Admitting: Pharmacist

## 2012-11-23 NOTE — Telephone Encounter (Signed)
Patient's wife called to let us know that amiodarone increased to 400 mg bid yesterday, and that the plan will be to cardiovert him in 3 weeks if INRs remain > 2.0 weekly and if he remains in Afib.  Patient on warfarin 2.5 mg qd.  He will continue same dose and recheck INR in 2 days.  Patient notified and appointment made.

## 2012-11-25 ENCOUNTER — Ambulatory Visit (INDEPENDENT_AMBULATORY_CARE_PROVIDER_SITE_OTHER): Payer: Medicare Other | Admitting: General Practice

## 2012-11-25 ENCOUNTER — Encounter (HOSPITAL_COMMUNITY)
Admission: RE | Admit: 2012-11-25 | Discharge: 2012-11-25 | Disposition: A | Discharge status: Home / Self Care | Payer: Medicare Other | Source: Ambulatory Visit | Attending: Internal Medicine | Admitting: Internal Medicine

## 2012-11-25 DIAGNOSIS — I4891 Unspecified atrial fibrillation: Secondary | ICD-10-CM

## 2012-11-25 DIAGNOSIS — Z7901 Long term (current) use of anticoagulants: Secondary | ICD-10-CM | POA: Diagnosis not present

## 2012-11-25 DIAGNOSIS — I5022 Chronic systolic (congestive) heart failure: Secondary | ICD-10-CM | POA: Insufficient documentation

## 2012-11-25 DIAGNOSIS — Z95810 Presence of automatic (implantable) cardiac defibrillator: Secondary | ICD-10-CM

## 2012-11-25 DIAGNOSIS — Z954 Presence of other heart-valve replacement: Secondary | ICD-10-CM | POA: Insufficient documentation

## 2012-11-25 DIAGNOSIS — J9610 Chronic respiratory failure, unspecified whether with hypoxia or hypercapnia: Secondary | ICD-10-CM | POA: Insufficient documentation

## 2012-11-25 DIAGNOSIS — Z5189 Encounter for other specified aftercare: Secondary | ICD-10-CM | POA: Insufficient documentation

## 2012-11-25 NOTE — Progress Notes (Signed)
Cardiac Rehab Medication Review by a Pharmacist  Does the patient  feel that his/her medications are working for him/her?  yes  Has the patient been experiencing any side effects to the medications prescribed?  no  Does the patient measure his/her own blood pressure or blood glucose at home?  yes   Does the patient have any problems obtaining medications due to transportation or finances?   no  Understanding of regimen: excellent Understanding of indications: excellent Potential of compliance: excellent  Jon Butler 11/25/2012 8:40 AM

## 2012-11-29 ENCOUNTER — Encounter (HOSPITAL_COMMUNITY)
Admission: RE | Admit: 2012-11-29 | Discharge: 2012-11-29 | Disposition: A | Discharge status: Home / Self Care | Payer: Medicare Other | Source: Ambulatory Visit | Attending: Internal Medicine | Admitting: Internal Medicine

## 2012-11-29 DIAGNOSIS — Z95810 Presence of automatic (implantable) cardiac defibrillator: Secondary | ICD-10-CM | POA: Diagnosis not present

## 2012-11-29 DIAGNOSIS — Z954 Presence of other heart-valve replacement: Secondary | ICD-10-CM | POA: Diagnosis not present

## 2012-11-29 DIAGNOSIS — I5022 Chronic systolic (congestive) heart failure: Secondary | ICD-10-CM | POA: Diagnosis not present

## 2012-11-29 DIAGNOSIS — Z5189 Encounter for other specified aftercare: Secondary | ICD-10-CM | POA: Diagnosis not present

## 2012-11-29 DIAGNOSIS — J9610 Chronic respiratory failure, unspecified whether with hypoxia or hypercapnia: Secondary | ICD-10-CM | POA: Diagnosis not present

## 2012-11-29 NOTE — Progress Notes (Signed)
Pt started cardiac rehab today.  Pt tolerated light exercise without difficulty. Telemetry rhythm paced. Vital signs stable. Will continue to monitor the patient throughout  the program.

## 2012-12-01 ENCOUNTER — Encounter: Payer: Self-pay | Admitting: Internal Medicine

## 2012-12-01 ENCOUNTER — Encounter (HOSPITAL_COMMUNITY)
Admission: RE | Admit: 2012-12-01 | Discharge: 2012-12-01 | Disposition: A | Discharge status: Home / Self Care | Payer: Medicare Other | Source: Ambulatory Visit | Attending: Internal Medicine | Admitting: Internal Medicine

## 2012-12-01 NOTE — Progress Notes (Signed)
Jon Butler reported having some soreness in his left ankle from an old ankle injury. Patient was switched from the walking track to the arm ergometer. Will continue to monitor the patient.

## 2012-12-03 ENCOUNTER — Ambulatory Visit (INDEPENDENT_AMBULATORY_CARE_PROVIDER_SITE_OTHER): Payer: Medicare Other | Admitting: Pharmacist

## 2012-12-03 ENCOUNTER — Encounter (HOSPITAL_COMMUNITY): Payer: Medicare Other

## 2012-12-03 DIAGNOSIS — Z7901 Long term (current) use of anticoagulants: Secondary | ICD-10-CM | POA: Diagnosis not present

## 2012-12-03 DIAGNOSIS — I4891 Unspecified atrial fibrillation: Secondary | ICD-10-CM | POA: Diagnosis not present

## 2012-12-03 DIAGNOSIS — Z95810 Presence of automatic (implantable) cardiac defibrillator: Secondary | ICD-10-CM

## 2012-12-03 LAB — POCT INR: INR: 4.6

## 2012-12-06 ENCOUNTER — Encounter (HOSPITAL_COMMUNITY)
Admission: RE | Admit: 2012-12-06 | Discharge: 2012-12-06 | Disposition: A | Discharge status: Home / Self Care | Payer: Medicare Other | Source: Ambulatory Visit | Attending: Internal Medicine | Admitting: Internal Medicine

## 2012-12-06 ENCOUNTER — Encounter (HOSPITAL_COMMUNITY): Payer: Medicare Other

## 2012-12-06 DIAGNOSIS — Z954 Presence of other heart-valve replacement: Secondary | ICD-10-CM | POA: Diagnosis not present

## 2012-12-06 DIAGNOSIS — I5022 Chronic systolic (congestive) heart failure: Secondary | ICD-10-CM | POA: Diagnosis not present

## 2012-12-06 DIAGNOSIS — Z95810 Presence of automatic (implantable) cardiac defibrillator: Secondary | ICD-10-CM | POA: Insufficient documentation

## 2012-12-06 DIAGNOSIS — J9610 Chronic respiratory failure, unspecified whether with hypoxia or hypercapnia: Secondary | ICD-10-CM | POA: Insufficient documentation

## 2012-12-06 DIAGNOSIS — Z5189 Encounter for other specified aftercare: Secondary | ICD-10-CM | POA: Insufficient documentation

## 2012-12-07 ENCOUNTER — Ambulatory Visit: Payer: Medicare Other | Admitting: Interventional Cardiology

## 2012-12-08 ENCOUNTER — Encounter (HOSPITAL_COMMUNITY)
Admission: RE | Admit: 2012-12-08 | Discharge: 2012-12-08 | Disposition: A | Discharge status: Home / Self Care | Payer: Medicare Other | Source: Ambulatory Visit | Attending: Internal Medicine | Admitting: Internal Medicine

## 2012-12-09 ENCOUNTER — Encounter: Payer: Self-pay | Admitting: Internal Medicine

## 2012-12-10 ENCOUNTER — Ambulatory Visit (INDEPENDENT_AMBULATORY_CARE_PROVIDER_SITE_OTHER): Payer: Medicare Other | Admitting: *Deleted

## 2012-12-10 ENCOUNTER — Encounter (INDEPENDENT_AMBULATORY_CARE_PROVIDER_SITE_OTHER): Payer: Self-pay

## 2012-12-10 ENCOUNTER — Encounter (HOSPITAL_COMMUNITY)
Admission: RE | Admit: 2012-12-10 | Discharge: 2012-12-10 | Disposition: A | Discharge status: Home / Self Care | Payer: Medicare Other | Source: Ambulatory Visit | Attending: Internal Medicine | Admitting: Internal Medicine

## 2012-12-10 DIAGNOSIS — Z95810 Presence of automatic (implantable) cardiac defibrillator: Secondary | ICD-10-CM | POA: Diagnosis not present

## 2012-12-10 DIAGNOSIS — I4891 Unspecified atrial fibrillation: Secondary | ICD-10-CM

## 2012-12-10 DIAGNOSIS — Z7901 Long term (current) use of anticoagulants: Secondary | ICD-10-CM

## 2012-12-10 NOTE — Progress Notes (Signed)
Reviewed home exercise with pt and wife today.  Pt plans to walk and do chair exercises at home for exercise.  Reviewed THR, pulse, RPE, sign and symptoms, and when to call 911 or MD.  Pt voiced understanding. Fabio Pierce, MA, ACSM RCEP

## 2012-12-13 ENCOUNTER — Encounter (HOSPITAL_COMMUNITY)
Admission: RE | Admit: 2012-12-13 | Discharge: 2012-12-13 | Disposition: A | Discharge status: Home / Self Care | Payer: Medicare Other | Source: Ambulatory Visit | Attending: Internal Medicine | Admitting: Internal Medicine

## 2012-12-15 ENCOUNTER — Encounter (HOSPITAL_COMMUNITY)
Admission: RE | Admit: 2012-12-15 | Discharge: 2012-12-15 | Disposition: A | Discharge status: Home / Self Care | Payer: Medicare Other | Source: Ambulatory Visit | Attending: Internal Medicine | Admitting: Internal Medicine

## 2012-12-15 DIAGNOSIS — M25579 Pain in unspecified ankle and joints of unspecified foot: Secondary | ICD-10-CM | POA: Diagnosis not present

## 2012-12-16 ENCOUNTER — Encounter (HOSPITAL_COMMUNITY): Payer: Self-pay

## 2012-12-16 ENCOUNTER — Ambulatory Visit (HOSPITAL_COMMUNITY)
Admission: RE | Admit: 2012-12-16 | Discharge: 2012-12-16 | Disposition: A | Discharge status: Home / Self Care | Payer: Medicare Other | Source: Ambulatory Visit | Attending: Internal Medicine | Admitting: Internal Medicine

## 2012-12-16 ENCOUNTER — Ambulatory Visit (INDEPENDENT_AMBULATORY_CARE_PROVIDER_SITE_OTHER): Payer: Medicare Other | Admitting: *Deleted

## 2012-12-16 ENCOUNTER — Encounter (HOSPITAL_COMMUNITY): Payer: Medicare Other

## 2012-12-16 ENCOUNTER — Other Ambulatory Visit: Payer: Self-pay | Admitting: *Deleted

## 2012-12-16 ENCOUNTER — Encounter (HOSPITAL_COMMUNITY): Payer: Self-pay | Admitting: *Deleted

## 2012-12-16 ENCOUNTER — Encounter: Payer: Self-pay | Admitting: *Deleted

## 2012-12-16 VITALS — BP 100/80 | HR 111 | Ht 69.25 in | Wt 209.4 lb

## 2012-12-16 DIAGNOSIS — R413 Other amnesia: Secondary | ICD-10-CM | POA: Diagnosis not present

## 2012-12-16 DIAGNOSIS — I4891 Unspecified atrial fibrillation: Secondary | ICD-10-CM | POA: Diagnosis not present

## 2012-12-16 DIAGNOSIS — I5023 Acute on chronic systolic (congestive) heart failure: Secondary | ICD-10-CM | POA: Insufficient documentation

## 2012-12-16 DIAGNOSIS — Z7901 Long term (current) use of anticoagulants: Secondary | ICD-10-CM

## 2012-12-16 DIAGNOSIS — Z95810 Presence of automatic (implantable) cardiac defibrillator: Secondary | ICD-10-CM

## 2012-12-16 LAB — CBC WITH DIFFERENTIAL/PLATELET
Eosinophils Absolute: 0.1 10*3/uL (ref 0.0–0.7)
Eosinophils Relative: 1 % (ref 0–5)
HCT: 40.5 % (ref 39.0–52.0)
Hemoglobin: 13.5 g/dL (ref 13.0–17.0)
Lymphocytes Relative: 27 % (ref 12–46)
Lymphs Abs: 1.7 10*3/uL (ref 0.7–4.0)
MCH: 32.1 pg (ref 26.0–34.0)
MCHC: 33.3 g/dL (ref 30.0–36.0)
MCV: 96.4 fL (ref 78.0–100.0)
Monocytes Relative: 12 % (ref 3–12)
Platelets: 123 10*3/uL — ABNORMAL LOW (ref 150–400)
RBC: 4.2 MIL/uL — ABNORMAL LOW (ref 4.22–5.81)

## 2012-12-16 LAB — BASIC METABOLIC PANEL
BUN: 21 mg/dL (ref 6–23)
CO2: 25 mEq/L (ref 19–32)
Calcium: 8.6 mg/dL (ref 8.4–10.5)
Creatinine, Ser: 1.05 mg/dL (ref 0.50–1.35)
GFR calc non Af Amer: 67 mL/min — ABNORMAL LOW (ref >90)
Glucose, Bld: 104 mg/dL — ABNORMAL HIGH (ref 70–99)
Sodium: 135 mEq/L (ref 135–145)

## 2012-12-16 LAB — PROTIME-INR
INR: 2.52 — ABNORMAL HIGH (ref 0.00–1.49)
Prothrombin Time: 26.3 seconds — ABNORMAL HIGH (ref 11.6–15.2)

## 2012-12-16 MED ORDER — VITAMIN B-1 100 MG PO TABS: 100.0000 mg | ORAL_TABLET | Freq: Two times a day (BID) | ORAL | Status: DC

## 2012-12-16 NOTE — Progress Notes (Signed)
Patient ID: Jon Butler, male   DOB: 1935/02/22, 77 y.o.   MRN: [id number]   Weight Range   Baseline proBNP    PCP: Dr Donette Larry EP: Dr Lad[facility] HPI: Jon Butler is a 77 yo male with a history of chronic systolic HF EF 20% due to NICM s/p CRT-D (Medtronic), mechanical AVR (St Jude), LBBB, PAF and non-compliance.  He recently developed Afib/flutter and was scheduled for TEE-DCCV on 09/20/12 at the same time when he had his CRT-D replacement for EOL, however his INR was 1.6 so cardioversion was postponed. (ICD generator was changed)  Admitted to [facility] 10/10 through 10/26/12 with ADHF in setting of recurrent AF. Developed cardiogenic shock and VDRF   BB and ACE-I held and amiodarone gtt was started. Required short term Milrinone and lasix.Diuresed 26 pounds and transitioned to 40 mg daily. DC-CV on 10/20. Remains on Coumadin. D/C weight 206 pounds.   He returns for follow up. Denies SOB/PND/Orthopnea. Complaining of L foot pain ---> has fracture. His son is concerned about memory loss. Weight at home 200-205 pounds.  His wife prepares his medications.   11/01/12 Labs INR > 8 10/26/12 K 3.0 Creatinine 1.23   Optivol : Fluid trending up to threshold. Still in A fib Activity ~ 2 hours per day.   ROS: All systems negative except as listed in HPI, PMH and Problem List.  Past Medical History  Diagnosis Date  . Left bundle-branch block   . Cardiomyopathy   . Chronic systolic heart failure     Current Outpatient Prescriptions  Medication Sig Dispense Refill  . allopurinol (ZYLOPRIM) 300 MG tablet Take 300 mg by mouth daily.        Marland Kitchen amiodarone (PACERONE) 400 MG tablet Take 1 tablet (400 mg total) by mouth 2 (two) times daily.  60 tablet  4  . amoxicillin (AMOXIL) 500 MG capsule Take 500 mg by mouth every other day.       Marland Kitchen aspirin EC 81 MG tablet Take 1 tablet (81 mg total) by mouth daily.      . carvedilol (COREG) 3.125 MG tablet Take 2 tablets (6.25 mg total) by mouth 2 (two)  times daily with a meal.  120 tablet  4  . Coenzyme Q10 (CO Q-10 PO) Take by mouth daily.        . colchicine 0.6 MG tablet Take 0.6 mg by mouth as needed.       . furosemide (LASIX) 40 MG tablet Take 40 mg by mouth daily. Take an extra dose of 40 mg if weight 205 lbs or greater.      Marland Kitchen lisinopril (PRINIVIL,ZESTRIL) 10 MG tablet Take 1 tablet (10 mg total) by mouth daily.  30 tablet  4  . Memantine HCl ER (NAMENDA XR) 28 MG CP24 Take 28 mg by mouth daily.      . Multiple Vitamins-Minerals (MULTIVITAMIN WITH MINERALS) tablet Take 1 tablet by mouth daily.        . rosuvastatin (CRESTOR) 10 MG tablet Take 10 mg by mouth daily.        Marland Kitchen spironolactone (ALDACTONE) 25 MG tablet Take 0.5 tablets (12.5 mg total) by mouth daily.  30 tablet  3  . traZODone (DESYREL) 50 MG tablet Take 50 mg by mouth at bedtime.        Marland Kitchen warfarin (COUMADIN) 5 MG tablet Take 2.5 mg by mouth daily. Take as directed by anticoagulation clinic       No current facility-administered medications for  this encounter.   Filed Vitals:   12/16/12 1535  BP: 100/80  Pulse: 111  Height: 5' 9.25" (1.759 m)  Weight: 209 lb 6.4 oz (94.983 kg)  SpO2: 98%   PHYSICAL EXAM: General:  Elderly. No resp difficulty Wife present HEENT: normal Neck: supple. JVP ~ 8. Carotids 2+ bilaterally; no bruits. No lymphadenopathy or thryomegaly appreciated. Cor: PMI normal. Irregular rate & rhythm. Mechanical s2 No rubs, gallops 2/6 TR. Lungs: clear Abdomen: soft, nontender, nondistended. No hepatosplenomegaly. No bruits or masses. Good bowel sounds. Extremities: no cyanosis, clubbing, rash, edema Neuro: alert & orientedx3, cranial nerves grossly intact. Moves all 4 extremities w/o difficulty. Affect pleasant.  ASSESSMENT & PLAN: 1. Chronic Systolic Heart Failure EF 20%  S/P Medtronic CRT-D 09/2012  . Will increase amiodarone back to 400 mg BID. Will need more therapeutic INRs before I would like to try and cardiovert him again. He is more rate  controlled currently, however has lost about 10-15% of his pacing d/t being back in afib. Concerned if he remains if fib for a long period of time that his HF will be exacerbated.  - NYHA III symptoms. Volume status trending up and verified by optivol.   - Continue current BB, ACE-I and Spiro dose. - Encouraged to increase his activity and being involved in CR, which starts tomorrow will help.  - Reinforced the need and ifmportance of daily weights, a low sodium diet, and fluid restriction (less than 2 L a day). Instructed to call the HF clinic if weight increases more than 3 lbs overnight or 5 lbs in a week.   2. A Fib- Remains in A fib per optivol.INR has been > 2  For 30 days. Continue  amiodarone to 400 mg BID   3. CKD stage 3- stable will continue to follow  4. Alcohol- Reports drinking 1-2 glass of wine a day. Reinforced alcohol abstinence especially with coumadin. His INRs have not been consistent.  5. Memory loss-  Refer to neurology. Add thiamine 100 mg twice a day  Follow up in 6 weeks.   CLEGG,AMY, NP-C 3:51 PM   Patient seen and examined with Tonye Becket, NP. We discussed all aspects of the encounter. I agree with the assessment and plan as stated above. ICD interrogated personally. Shows recurrent AF with partial loss of biV pacing and elevated fluid levels.  I think we need to get him back in NSR to better manage his HF. His situation is also complicated by lack of insight into his HF management. His wife feels that his dementia is getting worse.   We will increase amiodarone and schedule repeat DC-CV. Also discussed the use of sliding scale diuretics at length. Stressed need to stop drinking ETOH completely. Will start thiamine 100mg  daily.   Time spent 50 minutes with over half that time discussing above issues.   Truman Hayward 3:32 PM

## 2012-12-16 NOTE — Patient Instructions (Addendum)
Your physician has recommended that you have a Cardioversion (DCCV). Electrical Cardioversion uses a jolt of electricity to your heart either through paddles or wired patches attached to your chest. This is a controlled, usually prescheduled, procedure. Defibrillation is done under light anesthesia in the hospital, and you usually go home the day of the procedure. This is done to get your heart back into a normal rhythm. You are not awake for the procedure. Please see the instruction sheet given to you today.  See instruction sheet  Referred to Neurology---they will call you with An appointment

## 2012-12-16 NOTE — Patient Instructions (Signed)
Follow up in 6 weeks  Take thiamine 100 mg twice a day  Do the following things EVERYDAY: 1) Weigh yourself in the morning before breakfast. Write it down and keep it in a log. 2) Take your medicines as prescribed 3) Eat low salt foods-Limit salt (sodium) to 2000 mg per day.  4) Stay as active as you can everyday 5) Limit all fluids for the day to less than 2 liters

## 2012-12-17 ENCOUNTER — Encounter (HOSPITAL_COMMUNITY)
Admission: RE | Admit: 2012-12-17 | Discharge: 2012-12-17 | Disposition: A | Discharge status: Home / Self Care | Payer: Medicare Other | Source: Ambulatory Visit | Attending: Internal Medicine | Admitting: Internal Medicine

## 2012-12-17 ENCOUNTER — Encounter (HOSPITAL_COMMUNITY): Payer: Self-pay | Admitting: Cardiology

## 2012-12-17 ENCOUNTER — Encounter (HOSPITAL_COMMUNITY): Payer: Self-pay | Admitting: Pharmacy Technician

## 2012-12-17 NOTE — Patient Instructions (Signed)
Pt scheduled for DCCV 12/20/12 CPT code 78469 With pts current insurance Primary Medicare- no pre cert req'd            Secondary-BSBC Supp- no pre cert req'd

## 2012-12-20 ENCOUNTER — Ambulatory Visit (HOSPITAL_COMMUNITY)
Admission: RE | Admit: 2012-12-20 | Discharge: 2012-12-20 | Disposition: A | Discharge status: Home / Self Care | Payer: Medicare Other | Source: Ambulatory Visit | Attending: Cardiology | Admitting: Cardiology

## 2012-12-20 ENCOUNTER — Encounter (HOSPITAL_COMMUNITY): Payer: Medicare Other | Admitting: Anesthesiology

## 2012-12-20 ENCOUNTER — Encounter (HOSPITAL_COMMUNITY): Payer: Medicare Other

## 2012-12-20 ENCOUNTER — Ambulatory Visit (HOSPITAL_COMMUNITY): Payer: Medicare Other | Admitting: Anesthesiology

## 2012-12-20 ENCOUNTER — Encounter (HOSPITAL_COMMUNITY): Payer: Self-pay | Admitting: *Deleted

## 2012-12-20 ENCOUNTER — Encounter (HOSPITAL_COMMUNITY)
Admission: RE | Disposition: A | Discharge status: Home / Self Care | Payer: Medicare Other | Source: Ambulatory Visit | Attending: Cardiology

## 2012-12-20 DIAGNOSIS — Z7901 Long term (current) use of anticoagulants: Secondary | ICD-10-CM | POA: Diagnosis not present

## 2012-12-20 DIAGNOSIS — I4892 Unspecified atrial flutter: Secondary | ICD-10-CM | POA: Diagnosis not present

## 2012-12-20 DIAGNOSIS — I509 Heart failure, unspecified: Secondary | ICD-10-CM | POA: Insufficient documentation

## 2012-12-20 DIAGNOSIS — I428 Other cardiomyopathies: Secondary | ICD-10-CM | POA: Diagnosis not present

## 2012-12-20 DIAGNOSIS — I4891 Unspecified atrial fibrillation: Secondary | ICD-10-CM

## 2012-12-20 DIAGNOSIS — I5022 Chronic systolic (congestive) heart failure: Secondary | ICD-10-CM | POA: Insufficient documentation

## 2012-12-20 DIAGNOSIS — Z7982 Long term (current) use of aspirin: Secondary | ICD-10-CM | POA: Insufficient documentation

## 2012-12-20 DIAGNOSIS — Z95810 Presence of automatic (implantable) cardiac defibrillator: Secondary | ICD-10-CM | POA: Insufficient documentation

## 2012-12-20 DIAGNOSIS — Z954 Presence of other heart-valve replacement: Secondary | ICD-10-CM | POA: Diagnosis not present

## 2012-12-20 DIAGNOSIS — N183 Chronic kidney disease, stage 3 unspecified: Secondary | ICD-10-CM | POA: Diagnosis not present

## 2012-12-20 HISTORY — PX: CARDIOVERSION: SHX1299

## 2012-12-20 LAB — PROTIME-INR
INR: 3.17 — ABNORMAL HIGH (ref 0.00–1.49)
Prothrombin Time: 31.4 seconds — ABNORMAL HIGH (ref 11.6–15.2)

## 2012-12-20 LAB — BASIC METABOLIC PANEL
BUN: 17 mg/dL (ref 6–23)
CO2: 29 mEq/L (ref 19–32)
Calcium: 8.5 mg/dL (ref 8.4–10.5)
Chloride: 98 mEq/L (ref 96–112)
Potassium: 4 mEq/L (ref 3.5–5.1)
Sodium: 136 mEq/L (ref 135–145)

## 2012-12-20 SURGERY — CARDIOVERSION
Anesthesia: Monitor Anesthesia Care

## 2012-12-20 MED ORDER — SODIUM CHLORIDE 0.9 % IV SOLN
INTRAVENOUS | Status: DC
  Administered 2012-12-20: 500 mL via INTRAVENOUS

## 2012-12-20 MED ORDER — PROPOFOL 10 MG/ML IV BOLUS
INTRAVENOUS | Status: DC | PRN
  Administered 2012-12-20: 80 mg via INTRAVENOUS

## 2012-12-20 MED ORDER — SODIUM CHLORIDE 0.9 % IV SOLN
INTRAVENOUS | Status: DC | PRN
  Administered 2012-12-20: 13:00:00 via INTRAVENOUS

## 2012-12-20 NOTE — Procedures (Signed)
Electrical Cardioversion Procedure Note Jon Butler [id number] 1935/04/02  Procedure: Electrical Cardioversion Indications:  Atrial Fibrillation.  INR has been therapeutic for > 3 wks.  INR 3.1 today.   Procedure Details Consent: Risks of procedure as well as the alternatives and risks of each were explained to the (patient/caregiver).  Consent for procedure obtained. Time Out: Verified patient identification, verified procedure, site/side was marked, verified correct patient position, special equipment/implants available, medications/allergies/relevent history reviewed, required imaging and test results available.  Performed  Patient placed on cardiac monitor, pulse oximetry, supplemental oxygen as necessary.  Sedation given: Propofol 80 mg IV per anesthesiology Pacer pads placed anterior and posterior chest.  Cardioverted 1 time(s).  Cardioverted at 200J.  Evaluation Findings: Post procedure EKG shows: NSR Complications: None Patient did tolerate procedure well.   Jon Butler 12/20/2012, 1:40 PM

## 2012-12-20 NOTE — Transfer of Care (Signed)
Immediate Anesthesia Transfer of Care Note  Patient: Jon Butler  Procedure(s) Performed: Procedure(s): CARDIOVERSION (N/A)  Patient Location: Endoscopy Unit  Anesthesia Type:MAC  Level of Consciousness: awake, alert  and oriented  Airway & Oxygen Therapy: Patient Spontanous Breathing and Patient connected to nasal cannula oxygen  Post-op Assessment: Report given to PACU RN and Post -op Vital signs reviewed and stable  Post vital signs: Reviewed and stable  Complications: No apparent anesthesia complications

## 2012-12-20 NOTE — Preoperative (Signed)
Beta Blockers   Reason not to administer Beta Blockers:Not Applicable 

## 2012-12-20 NOTE — Anesthesia Postprocedure Evaluation (Signed)
Anesthesia Post Note  Patient: Jon Butler  Procedure(s) Performed: Procedure(s) (LRB): CARDIOVERSION (N/A)  Anesthesia type: General  Patient location: PACU  Post pain: Pain level controlled  Post assessment: Patient's Cardiovascular Status Stable  Last Vitals:  Filed Vitals:   12/20/12 1350  BP: 103/63  Pulse: 61  Temp:   Resp: 12    Post vital signs: Reviewed and stable  Level of consciousness: alert  Complications: No apparent anesthesia complications

## 2012-12-20 NOTE — H&P (View-Only) (Signed)
Patient ID: Jon Butler, male   DOB: 06-19-1935, 77 y.o.   MRN: [id number]   Weight Range   Baseline proBNP    PCP: Dr Donette Larry EP: Dr Lad[facility] HPI: [name] is a 77 yo male with a history of chronic systolic HF EF 20% due to NICM s/p CRT-D (Medtronic), mechanical AVR (St Jude), LBBB, PAF and non-compliance.  He recently developed Afib/flutter and was scheduled for TEE-DCCV on 09/20/12 at the same time when he had his CRT-D replacement for EOL, however his INR was 1.6 so cardioversion was postponed. (ICD generator was changed)  Admitted to [facility] 10/10 through 10/26/12 with ADHF in setting of recurrent AF. Developed cardiogenic shock and VDRF   BB and ACE-I held and amiodarone gtt was started. Required short term Milrinone and lasix.Diuresed 26 pounds and transitioned to 40 mg daily. DC-CV on 10/20. Remains on Coumadin. D/C weight 206 pounds.   Follow up: Saw Dr.Taylor last week and was back in Afib with better ventricular rate control. Denies SOB, orthopnea, CP or edema. Taking medications as prescribed. Weight at home stable at 200-203 lbs. Reports he can go a block walking the dog with no SOB, however family is shaking their head not accurate (probably 50 yds) No bleeding problems. Drinking 2 glasses of wine a day. SBP 90-120s. HR 50-80s.  11/01/12 Labs INR > 8 10/26/12 K 3.0 Creatinine 1.23   ROS: All systems negative except as listed in HPI, PMH and Problem List.  Past Medical History  Diagnosis Date  . Left bundle-branch block   . Cardiomyopathy   . Chronic systolic heart failure     Current Outpatient Prescriptions  Medication Sig Dispense Refill  . allopurinol (ZYLOPRIM) 300 MG tablet Take 300 mg by mouth daily.        Marland Kitchen amiodarone (PACERONE) 400 MG tablet Take 1 tablet (400 mg total) by mouth daily.  30 tablet  4  . amoxicillin (AMOXIL) 500 MG capsule Take 500 mg by mouth every other day.       Marland Kitchen aspirin EC 81 MG tablet Take 1 tablet (81 mg total) by mouth  daily.      . carvedilol (COREG) 3.125 MG tablet Take 2 tablets (6.25 mg total) by mouth 2 (two) times daily with a meal.  120 tablet  4  . Coenzyme Q10 (CO Q-10 PO) Take by mouth daily.        . colchicine 0.6 MG tablet Take 0.6 mg by mouth as needed.       . furosemide (LASIX) 40 MG tablet Take 40 mg by mouth daily. Take an extra dose of 40 mg if weight 205 lbs or greater.      Marland Kitchen lisinopril (PRINIVIL,ZESTRIL) 10 MG tablet Take 1 tablet (10 mg total) by mouth daily.  30 tablet  4  . Memantine HCl ER (NAMENDA XR) 28 MG CP24 Take 28 mg by mouth daily.      . Multiple Vitamins-Minerals (MULTIVITAMIN WITH MINERALS) tablet Take 1 tablet by mouth daily.        . rosuvastatin (CRESTOR) 10 MG tablet Take 10 mg by mouth daily.        Marland Kitchen spironolactone (ALDACTONE) 25 MG tablet Take 0.5 tablets (12.5 mg total) by mouth daily.  30 tablet  3  . traZODone (DESYREL) 50 MG tablet Take 50 mg by mouth at bedtime.        Marland Kitchen warfarin (COUMADIN) 5 MG tablet Take as directed by anticoagulation clinic  No current facility-administered medications for this encounter.   Filed Vitals:   11/22/12 1446  BP: 98/60  Pulse: 70  Height: 5\' 10"  (1.778 m)  Weight: 203 lb 1.9 oz (92.135 kg)  SpO2: 96%   PHYSICAL EXAM: General:  Elderly. No resp difficulty Wife present HEENT: normal Neck: supple. JVP flat. Carotids 2+ bilaterally; no bruits. No lymphadenopathy or thryomegaly appreciated. Cor: PMI normal. Irregular rate & rhythm. Mechanical s2 No rubs, gallops 2/6 TR. Lungs: clear Abdomen: soft, nontender, nondistended. No hepatosplenomegaly. No bruits or masses. Good bowel sounds. Extremities: no cyanosis, clubbing, rash, edema Neuro: alert & orientedx3, cranial nerves grossly intact. Moves all 4 extremities w/o difficulty. Affect pleasant.  ASSESSMENT & PLAN: 1. Chronic Systolic Heart Failure EF 20%  S/P Medtronic CRT-D 09/2012  - Unfortunately [name] is back in atrial fibrillation. Will increase amiodarone  back to 400 mg BID. Will need more therapeutic INRs before I would like to try and cardiovert him again. He is more rate controlled currently, however has lost about 10-15% of his pacing d/t being back in afib. Concerned if he remains if fib for a long period of time that his HF will be exacerbated.  - NYHA III symptoms and volume status stable. Will continue lasix 40 mg daily. No crossings over threshold on Optivol. - Continue current BB, ACE-I and Spiro dose. - Encouraged to increase his activity and being involved in CR, which starts tomorrow will help.  - Reinforced the need and importance of daily weights, a low sodium diet, and fluid restriction (less than 2 L a day). Instructed to call the HF clinic if weight increases more than 3 lbs overnight or 5 lbs in a week.   2. A Fib- As above he is back in afib. Will increase amiodarone to 400 mg BID and will see back in 3 weeks to schedule DC-CV if INR has been therapeutic for 4 consecutive weeks. Wife and son present and informed all to call Coumadin clinic to let them know of Amiodarone dose change.   3. CKD stage 3- stable will continue to follow  4. Alcohol- Reports drinking 1-2 glass of wine a day. Reinforced alcohol abstinence especially with coumadin. His INRs have not been consistent.   F/U 3 weeks  Aundria Rud, NP-C 2:54 PM  Patient seen and examined with Ulla Potash, NP. ICD diagnostics reviewed personally. We discussed all aspects of the encounter. I agree with the assessment and plan as stated above.  His HF is improved. Volume status looks good on exam and by Optivol. Unfortunately he is back in AF and I think his HF will deteriorate with loss of LV pacing in this setting, Agree with increasing amio and planning repeat DC-CV once INR therapeutic for 1 month. (Has AVR so can't switch to NOAC).   Ongoing HF education provided including reinforcing need for daily weights and reviewed use of sliding scale diuretics. BP too low to  titrate HF meds further at this point.   I continue to be concerned about his motivation to take responsibility for his own care.    Kenyetta Fife,MD 5:50 PM

## 2012-12-20 NOTE — Interval H&P Note (Signed)
History and Physical Interval Note:  12/20/2012 1:39 PM  Jon Butler  has presented today for surgery, with the diagnosis of afib  The various methods of treatment have been discussed with the patient and family. After consideration of risks, benefits and other options for treatment, the patient has consented to  Procedure(s): CARDIOVERSION (N/A) as a surgical intervention .  The patient's history has been reviewed, patient examined, no change in status, stable for surgery.  I have reviewed the patient's chart and labs.  Questions were answered to the patient's satisfaction.     Marshell Dilauro [employer]

## 2012-12-20 NOTE — Anesthesia Preprocedure Evaluation (Addendum)
Anesthesia Evaluation  Patient identified by MRN, date of birth, ID band Patient awake    Reviewed: Allergy & Precautions, H&P , NPO status , Patient's Chart, lab work & pertinent test results, reviewed documented beta blocker date and time   Airway Mallampati: II TM Distance: >3 FB Neck ROM: full    Dental   Pulmonary pneumonia -,  breath sounds clear to auscultation        Cardiovascular + Peripheral Vascular Disease and +CHF + dysrhythmias Atrial Fibrillation and Ventricular Tachycardia + pacemaker + Cardiac Defibrillator + Valvular Problems/Murmurs AS Rhythm:regular     Neuro/Psych negative neurological ROS  negative psych ROS   GI/Hepatic negative GI ROS, Neg liver ROS,   Endo/Other  negative endocrine ROS  Renal/GU Renal Insufficiency and ARFRenal disease  negative genitourinary   Musculoskeletal   Abdominal   Peds  Hematology negative hematology ROS (+)   Anesthesia Other Findings See surgeon's H&P   Reproductive/Obstetrics negative OB ROS                           Anesthesia Physical Anesthesia Plan  ASA: IV  Anesthesia Plan: General   Post-op Pain Management:    Induction: Intravenous  Airway Management Planned: Mask  Additional Equipment:   Intra-op Plan:   Post-operative Plan:   Informed Consent: I have reviewed the patients History and Physical, chart, labs and discussed the procedure including the risks, benefits and alternatives for the proposed anesthesia with the patient or authorized representative who has indicated his/her understanding and acceptance.   Dental Advisory Given  Plan Discussed with: CRNA and Anesthesiologist  Anesthesia Plan Comments:         Anesthesia Quick Evaluation

## 2012-12-21 ENCOUNTER — Encounter (HOSPITAL_COMMUNITY): Payer: Self-pay | Admitting: Cardiology

## 2012-12-22 ENCOUNTER — Encounter (HOSPITAL_COMMUNITY): Admission: RE | Admit: 2012-12-22 | Payer: Medicare Other | Source: Ambulatory Visit

## 2012-12-24 ENCOUNTER — Ambulatory Visit (INDEPENDENT_AMBULATORY_CARE_PROVIDER_SITE_OTHER): Payer: Medicare Other | Admitting: Pharmacist

## 2012-12-24 ENCOUNTER — Encounter (HOSPITAL_COMMUNITY)
Admission: RE | Admit: 2012-12-24 | Discharge: 2012-12-24 | Disposition: A | Discharge status: Home / Self Care | Payer: Medicare Other | Source: Ambulatory Visit | Attending: Internal Medicine | Admitting: Internal Medicine

## 2012-12-24 DIAGNOSIS — Z7901 Long term (current) use of anticoagulants: Secondary | ICD-10-CM | POA: Diagnosis not present

## 2012-12-24 DIAGNOSIS — Z95810 Presence of automatic (implantable) cardiac defibrillator: Secondary | ICD-10-CM | POA: Diagnosis not present

## 2012-12-24 DIAGNOSIS — I4891 Unspecified atrial fibrillation: Secondary | ICD-10-CM | POA: Diagnosis not present

## 2012-12-27 ENCOUNTER — Encounter (HOSPITAL_COMMUNITY)
Admission: RE | Admit: 2012-12-27 | Discharge: 2012-12-27 | Disposition: A | Discharge status: Home / Self Care | Payer: Medicare Other | Source: Ambulatory Visit | Attending: Internal Medicine | Admitting: Internal Medicine

## 2012-12-29 ENCOUNTER — Encounter (HOSPITAL_COMMUNITY): Payer: Medicare Other

## 2012-12-31 ENCOUNTER — Encounter (HOSPITAL_COMMUNITY): Payer: Medicare Other

## 2013-01-03 ENCOUNTER — Encounter (HOSPITAL_COMMUNITY)
Admission: RE | Admit: 2013-01-03 | Discharge: 2013-01-03 | Disposition: A | Discharge status: Home / Self Care | Payer: Medicare Other | Source: Ambulatory Visit | Attending: Internal Medicine | Admitting: Internal Medicine

## 2013-01-03 ENCOUNTER — Ambulatory Visit (INDEPENDENT_AMBULATORY_CARE_PROVIDER_SITE_OTHER): Payer: Medicare Other | Admitting: Pharmacist

## 2013-01-03 DIAGNOSIS — Z7901 Long term (current) use of anticoagulants: Secondary | ICD-10-CM | POA: Diagnosis not present

## 2013-01-03 DIAGNOSIS — I4891 Unspecified atrial fibrillation: Secondary | ICD-10-CM | POA: Diagnosis not present

## 2013-01-03 DIAGNOSIS — Z5181 Encounter for therapeutic drug level monitoring: Secondary | ICD-10-CM

## 2013-01-03 DIAGNOSIS — Z95810 Presence of automatic (implantable) cardiac defibrillator: Secondary | ICD-10-CM | POA: Diagnosis not present

## 2013-01-03 MED ORDER — WARFARIN SODIUM 2 MG PO TABS: 2.0000 mg | ORAL_TABLET | Freq: Every day | ORAL | Status: DC

## 2013-01-03 NOTE — Progress Notes (Signed)
Rajendra P Mayer 77 y.o. male Nutrition Note Spoke with pt. Pt states his UBW 210-212 lb. Per EMR, pt's reported UBW appears accurate. Pt wt today 92.4 kg (203.3 lb), which is down 6-9 lbs from UBW. Nutrition Plan and Nutrition Survey goals reviewed with pt. Pt is following Step 2 of the Therapeutic Lifestyle Changes diet. Pt dx with CHF. Per discussion, pt eats out "at least 3 times a week." Ways to decrease sodium intake when eating out reviewed. Pt avoids processed and canned foods at home. Pt on Coumadin and is aware of the need to follow a diet with consistent amounts of vitamin K. Pt aware of nutrition education classes offered and plans on attending nutrition classes with his wife.  Nutrition Diagnosis   Food-and nutrition-related knowledge deficit related to lack of exposure to information as related to diagnosis of: ? CVD   Nutrition RX/ Estimated Daily Nutrition Needs for: wt maintenance 2350-2700 Kcal, 75-90 gm fat, 15-18 gm sat fat, 2.3-2.7 gm trans-fat, <1500 mg sodium  Nutrition Intervention   Pt's individual nutrition plan reviewed with pt.   Benefits of adopting Therapeutic Lifestyle Changes discussed when Medficts reviewed.   Pt to attend the Portion Distortion class   Pt to attend the  ? Nutrition I class                     ? Nutrition II class   Pt given handouts for: ? Nutrition class schedule   Continue client-centered nutrition education by RD, as part of interdisciplinary care. Goal(s)   Pt to describe the benefit of including fruits, vegetables, whole grains, and low-fat dairy products in a heart healthy meal plan. Monitor and Evaluate progress toward nutrition goal with team. Nutrition Risk: Change to Moderate Mickle Plumb, M.Ed, RD, LDN, CDE 01/03/2013 3:31 PM

## 2013-01-05 ENCOUNTER — Encounter (HOSPITAL_COMMUNITY)
Admission: RE | Admit: 2013-01-05 | Discharge: 2013-01-05 | Disposition: A | Discharge status: Home / Self Care | Payer: Medicare Other | Source: Ambulatory Visit | Attending: Internal Medicine | Admitting: Internal Medicine

## 2013-01-07 ENCOUNTER — Encounter (HOSPITAL_COMMUNITY)
Admission: RE | Admit: 2013-01-07 | Discharge: 2013-01-07 | Disposition: A | Discharge status: Home / Self Care | Payer: Medicare Other | Source: Ambulatory Visit | Attending: Internal Medicine | Admitting: Internal Medicine

## 2013-01-07 DIAGNOSIS — Z954 Presence of other heart-valve replacement: Secondary | ICD-10-CM | POA: Diagnosis not present

## 2013-01-07 DIAGNOSIS — J9610 Chronic respiratory failure, unspecified whether with hypoxia or hypercapnia: Secondary | ICD-10-CM | POA: Diagnosis not present

## 2013-01-07 DIAGNOSIS — Z95810 Presence of automatic (implantable) cardiac defibrillator: Secondary | ICD-10-CM | POA: Insufficient documentation

## 2013-01-07 DIAGNOSIS — Z5189 Encounter for other specified aftercare: Secondary | ICD-10-CM | POA: Insufficient documentation

## 2013-01-07 DIAGNOSIS — I5022 Chronic systolic (congestive) heart failure: Secondary | ICD-10-CM | POA: Diagnosis not present

## 2013-01-10 ENCOUNTER — Encounter (HOSPITAL_COMMUNITY)
Admission: RE | Admit: 2013-01-10 | Discharge: 2013-01-10 | Disposition: A | Discharge status: Home / Self Care | Payer: Medicare Other | Source: Ambulatory Visit | Attending: Internal Medicine | Admitting: Internal Medicine

## 2013-01-12 ENCOUNTER — Encounter (HOSPITAL_COMMUNITY)
Admission: RE | Admit: 2013-01-12 | Discharge: 2013-01-12 | Disposition: A | Discharge status: Home / Self Care | Payer: Medicare Other | Source: Ambulatory Visit | Attending: Internal Medicine | Admitting: Internal Medicine

## 2013-01-14 ENCOUNTER — Ambulatory Visit: Payer: Self-pay | Admitting: Neurology

## 2013-01-14 ENCOUNTER — Ambulatory Visit (INDEPENDENT_AMBULATORY_CARE_PROVIDER_SITE_OTHER): Payer: Medicare Other

## 2013-01-14 ENCOUNTER — Encounter (HOSPITAL_COMMUNITY)
Admission: RE | Admit: 2013-01-14 | Discharge: 2013-01-14 | Disposition: A | Discharge status: Home / Self Care | Payer: Medicare Other | Source: Ambulatory Visit | Attending: Internal Medicine | Admitting: Internal Medicine

## 2013-01-14 DIAGNOSIS — I4891 Unspecified atrial fibrillation: Secondary | ICD-10-CM

## 2013-01-14 DIAGNOSIS — Z5181 Encounter for therapeutic drug level monitoring: Secondary | ICD-10-CM

## 2013-01-14 DIAGNOSIS — Z7901 Long term (current) use of anticoagulants: Secondary | ICD-10-CM | POA: Diagnosis not present

## 2013-01-14 DIAGNOSIS — Z95810 Presence of automatic (implantable) cardiac defibrillator: Secondary | ICD-10-CM | POA: Diagnosis not present

## 2013-01-14 LAB — POCT INR: INR: 3.3

## 2013-01-17 ENCOUNTER — Encounter (HOSPITAL_COMMUNITY)
Admission: RE | Admit: 2013-01-17 | Discharge: 2013-01-17 | Disposition: A | Discharge status: Home / Self Care | Payer: Medicare Other | Source: Ambulatory Visit | Attending: Internal Medicine | Admitting: Internal Medicine

## 2013-01-19 ENCOUNTER — Encounter (HOSPITAL_COMMUNITY)
Admission: RE | Admit: 2013-01-19 | Discharge: 2013-01-19 | Disposition: A | Discharge status: Home / Self Care | Payer: Medicare Other | Source: Ambulatory Visit | Attending: Internal Medicine | Admitting: Internal Medicine

## 2013-01-21 ENCOUNTER — Encounter (HOSPITAL_COMMUNITY)
Admission: RE | Admit: 2013-01-21 | Discharge: 2013-01-21 | Disposition: A | Discharge status: Home / Self Care | Payer: Medicare Other | Source: Ambulatory Visit | Attending: Internal Medicine | Admitting: Internal Medicine

## 2013-01-24 ENCOUNTER — Encounter (HOSPITAL_COMMUNITY)
Admission: RE | Admit: 2013-01-24 | Discharge: 2013-01-24 | Disposition: A | Discharge status: Home / Self Care | Payer: Medicare Other | Source: Ambulatory Visit | Attending: Internal Medicine | Admitting: Internal Medicine

## 2013-01-26 ENCOUNTER — Encounter (HOSPITAL_COMMUNITY)
Admission: RE | Admit: 2013-01-26 | Discharge: 2013-01-26 | Disposition: A | Discharge status: Home / Self Care | Payer: Medicare Other | Source: Ambulatory Visit | Attending: Internal Medicine | Admitting: Internal Medicine

## 2013-01-27 ENCOUNTER — Encounter (HOSPITAL_COMMUNITY): Payer: Self-pay

## 2013-01-27 ENCOUNTER — Ambulatory Visit (HOSPITAL_COMMUNITY)
Admission: RE | Admit: 2013-01-27 | Discharge: 2013-01-27 | Disposition: A | Discharge status: Home / Self Care | Payer: Medicare Other | Source: Ambulatory Visit | Attending: Internal Medicine | Admitting: Internal Medicine

## 2013-01-27 VITALS — BP 108/58 | HR 80 | Ht 69.0 in | Wt 201.1 lb

## 2013-01-27 DIAGNOSIS — I4891 Unspecified atrial fibrillation: Secondary | ICD-10-CM | POA: Diagnosis not present

## 2013-01-27 DIAGNOSIS — I5022 Chronic systolic (congestive) heart failure: Secondary | ICD-10-CM | POA: Diagnosis not present

## 2013-01-27 MED ORDER — AMIODARONE HCL 400 MG PO TABS: 200.0000 mg | ORAL_TABLET | Freq: Two times a day (BID) | ORAL | Status: DC

## 2013-01-27 MED ORDER — LISINOPRIL 10 MG PO TABS: 5.0000 mg | ORAL_TABLET | Freq: Every day | ORAL | Status: DC

## 2013-01-27 NOTE — Progress Notes (Signed)
Patient ID: Jon Butler, male   DOB: [birth year], 78 y.o.   MRN: [id number]   Weight Range   Baseline proBNP    PCP: Dr Lysle Rubens EP: Dr Lovena Le [facility] HPI: Jon Butler is a 78 yo male with a history of chronic systolic HF EF 54% due to NICM s/p CRT-D (Medtronic), mechanical AVR (St Jude), LBBB, PAF and non-compliance. S/P DC-CV 12/20/12  He recently developed Afib/flutter and was scheduled for TEE-DCCV on 09/20/12 at the same time when he had his CRT-D replacement for EOL, however his INR was 1.6 so cardioversion was postponed. (ICD generator was changed)  Admitted to [facility] 10/10 through 10/26/12 with ADHF in setting of recurrent AF. Developed cardiogenic shock and VDRF   BB and ACE-I held and amiodarone gtt was started. Required short term Milrinone and lasix.Diuresed 26 pounds and transitioned to 40 mg daily. DC-CV on 10/20. Remains on Coumadin. D/C weight 206 pounds.   S/P successful DC-CV 12/20/12   He returns for follow up. Last visit amiodarone increased to 400 mg twice a day because he was back in Afib. Had successful DC-CV 12/20/12.  Denies SOB/PND/Orthopnea. His wife says he has ongoing dyspnea with exertion and step. SBP soft at home 60-80s. Weight at home 196-202 pounds. His wife prepares his medications.   11/01/12 Labs INR > 8 10/26/12 K 3.0 Creatinine 1.23  12/11/1/4 K 4.6 Creatinine 1.05 12/15/`4 K 4.0 Creatinine 1.04  Optivol : Fluid trending up but below to threshold. No AFib/VT after DC-CV Activity ~ 2 hours per day.   ROS: All systems negative except as listed in HPI, PMH and Problem List.  Past Medical History  Diagnosis Date  . Left bundle-branch block   . Cardiomyopathy   . Chronic systolic heart failure     Current Outpatient Prescriptions  Medication Sig Dispense Refill  . allopurinol (ZYLOPRIM) 300 MG tablet Take 300 mg by mouth daily.        Marland Kitchen amoxicillin (AMOXIL) 500 MG capsule Take 500 mg by mouth every other day.       . Coenzyme Q10 (CO Q-10 PO)  Take 1 tablet by mouth daily.       . colchicine 0.6 MG tablet Take 0.6 mg by mouth daily as needed (gout pain).       . Memantine HCl ER (NAMENDA XR) 28 MG CP24 Take 28 mg by mouth daily.      . Multiple Vitamins-Minerals (MULTIVITAMIN WITH MINERALS) tablet Take 1 tablet by mouth daily.       . rosuvastatin (CRESTOR) 10 MG tablet Take 10 mg by mouth daily.        . traZODone (DESYREL) 50 MG tablet Take 150 mg by mouth at bedtime.       Marland Kitchen amiodarone (PACERONE) 400 MG tablet Take 1 tablet (400 mg total) by mouth 2 (two) times daily.  60 tablet  4  . aspirin EC 81 MG tablet Take 1 tablet (81 mg total) by mouth daily.      . carvedilol (COREG) 3.125 MG tablet Take 2 tablets (6.25 mg total) by mouth 2 (two) times daily with a meal.  120 tablet  4  . furosemide (LASIX) 40 MG tablet Take 40 mg by mouth daily. Take an extra dose of 40 mg if weight 205 lbs or greater.      Marland Kitchen lisinopril (PRINIVIL,ZESTRIL) 10 MG tablet Take 1 tablet (10 mg total) by mouth daily.  30 tablet  4  . spironolactone (ALDACTONE) 25 MG tablet  Take 0.5 tablets (12.5 mg total) by mouth daily.  30 tablet  3  . thiamine (VITAMIN B-1) 100 MG tablet Take 1 tablet (100 mg total) by mouth 2 (two) times daily.  60 tablet  6  . warfarin (COUMADIN) 2 MG tablet Take 1 tablet (2 mg total) by mouth daily.  90 tablet  1   No current facility-administered medications for this encounter.   Filed Vitals:   01/27/13 1449  BP: 108/58  Pulse: 80  Height: 5\' 9"  (1.753 m)  Weight: 201 lb 1.9 oz (91.227 kg)  SpO2: 97%   PHYSICAL EXAM: General:  Elderly. No resp difficulty Wife present HEENT: normal Neck: supple. JVP ~ 8. Carotids 2+ bilaterally; no bruits. No lymphadenopathy or thryomegaly appreciated. Cor: PMI normal. Irregular rate & rhythm. Mechanical s2 No rubs, gallops 2/6 TR. Lungs: clear Abdomen: soft, nontender, nondistended. No hepatosplenomegaly. No bruits or masses. Good bowel sounds. Extremities: no cyanosis, clubbing, rash,  edema Neuro: alert & orientedx3, cranial nerves grossly intact. Moves all 4 extremities w/o difficulty. Affect pleasant.  ASSESSMENT & PLAN: 1. Chronic Systolic Heart Failure EF 20%  S/P Medtronic CRT-D 09/2012  . - NYHA II-III symptoms. Volume status stable. Would like to keep his weight at home 194-197 pounds. - Continue current BB, lasix, and Spiro dose. Cut back lisinopril to 5 mg daily at bed time. Hopefully this will help with hypotension.   - Continue cardiac rehab.  - Reinforced the need and ifmportance of daily weights, a low sodium diet, and fluid restriction (less than 2 L a day). Instructed to call the HF clinic if weight increases more than 3 lbs overnight or 5 lbs in a week.   2. A Fib-  S/P DC-CV 12/20/12. Cut back amiodarone to 200 mg bid. On coumadin No bleeding problems. 3. CKD stage 3- stable will continue to follow 4. Alcohol- Reports drinking 1-2 glass of wine a day. Reinforced alcohol abstinence especially with coumadin. His INRs have not been consistent. 5. Memory loss-  Referral  to neurology pending. 6. Mechanical AVR - on coumadin  Follow up in 6 weeks.   CLEGG,AMY, NP-C 2:51 PM  Patient seen and examined with Jon Grinder, NP. We discussed all aspects of the encounter. I agree with the assessment and plan as stated above.   He looks as good as I have seen him to date. Volume status very good on exam and by Optivol which I reviewed personally in clinic. No VT or AF on interrogation. Activity level improving. Given low BP will cut back lisinopril. Drop amio dosing back to 200 bid. Continue coumadin in setting of AV and mechanical AVR.   Jon Bensimhon,MD 9:06 PM

## 2013-01-27 NOTE — Patient Instructions (Signed)
Follow up in 6 weeks  Take amiodarone 200 mg twice a day  Take lisinopril 5 mg at bedtime  Do the following things EVERYDAY: 1) Weigh yourself in the morning before breakfast. Write it down and keep it in a log. 2) Take your medicines as prescribed 3) Eat low salt foods-Limit salt (sodium) to 2000 mg per day.  4) Stay as active as you can everyday 5) Limit all fluids for the day to less than 2 liters

## 2013-01-28 ENCOUNTER — Ambulatory Visit (INDEPENDENT_AMBULATORY_CARE_PROVIDER_SITE_OTHER): Payer: Medicare Other

## 2013-01-28 ENCOUNTER — Encounter (HOSPITAL_COMMUNITY)
Admission: RE | Admit: 2013-01-28 | Discharge: 2013-01-28 | Disposition: A | Discharge status: Home / Self Care | Payer: Medicare Other | Source: Ambulatory Visit | Attending: Internal Medicine | Admitting: Internal Medicine

## 2013-01-28 DIAGNOSIS — Z7901 Long term (current) use of anticoagulants: Secondary | ICD-10-CM | POA: Diagnosis not present

## 2013-01-28 DIAGNOSIS — Z5181 Encounter for therapeutic drug level monitoring: Secondary | ICD-10-CM | POA: Diagnosis not present

## 2013-01-28 DIAGNOSIS — Z95810 Presence of automatic (implantable) cardiac defibrillator: Secondary | ICD-10-CM

## 2013-01-28 DIAGNOSIS — I4891 Unspecified atrial fibrillation: Secondary | ICD-10-CM

## 2013-01-28 LAB — POCT INR: INR: 2.5

## 2013-01-28 NOTE — Progress Notes (Signed)
Nutrition Note Spoke with pt and pt's wife. Pt's wife requested more information re: a low sodium diet while pt following a diet low in vitamin K. Pt's wife reports they have been following a diet with consistent amounts of vitamin K for "3 years." Following a low-sodium diet is new to pt. Pt and pt wife educated re: a low sodium diet and how to season foods without salt. Pt and his wife expressed understanding of the information reviewed. Continue client-centered nutrition education by RD as part of interdisciplinary care.  Monitor and evaluate progress toward nutrition goal with team.  Derek Mound, M.Ed, RD, LDN, CDE 01/28/2013 2:27 PM

## 2013-01-31 ENCOUNTER — Encounter (HOSPITAL_COMMUNITY)
Admission: RE | Admit: 2013-01-31 | Discharge: 2013-01-31 | Disposition: A | Discharge status: Home / Self Care | Payer: Medicare Other | Source: Ambulatory Visit | Attending: Internal Medicine | Admitting: Internal Medicine

## 2013-02-02 ENCOUNTER — Encounter (HOSPITAL_COMMUNITY)
Admission: RE | Admit: 2013-02-02 | Discharge: 2013-02-02 | Disposition: A | Discharge status: Home / Self Care | Payer: Medicare Other | Source: Ambulatory Visit | Attending: Internal Medicine | Admitting: Internal Medicine

## 2013-02-04 ENCOUNTER — Encounter (HOSPITAL_COMMUNITY)
Admission: RE | Admit: 2013-02-04 | Discharge: 2013-02-04 | Disposition: A | Discharge status: Home / Self Care | Payer: Medicare Other | Source: Ambulatory Visit | Attending: Internal Medicine | Admitting: Internal Medicine

## 2013-02-07 ENCOUNTER — Encounter (HOSPITAL_COMMUNITY)
Admission: RE | Admit: 2013-02-07 | Discharge: 2013-02-07 | Disposition: A | Discharge status: Home / Self Care | Payer: Medicare Other | Source: Ambulatory Visit | Attending: Internal Medicine | Admitting: Internal Medicine

## 2013-02-07 DIAGNOSIS — Z5189 Encounter for other specified aftercare: Secondary | ICD-10-CM | POA: Insufficient documentation

## 2013-02-07 DIAGNOSIS — J9610 Chronic respiratory failure, unspecified whether with hypoxia or hypercapnia: Secondary | ICD-10-CM | POA: Diagnosis not present

## 2013-02-07 DIAGNOSIS — I5022 Chronic systolic (congestive) heart failure: Secondary | ICD-10-CM | POA: Insufficient documentation

## 2013-02-07 DIAGNOSIS — Z954 Presence of other heart-valve replacement: Secondary | ICD-10-CM | POA: Insufficient documentation

## 2013-02-07 DIAGNOSIS — Z95810 Presence of automatic (implantable) cardiac defibrillator: Secondary | ICD-10-CM | POA: Insufficient documentation

## 2013-02-09 ENCOUNTER — Encounter (HOSPITAL_COMMUNITY)
Admission: RE | Admit: 2013-02-09 | Discharge: 2013-02-09 | Disposition: A | Discharge status: Home / Self Care | Payer: Medicare Other | Source: Ambulatory Visit | Attending: Internal Medicine | Admitting: Internal Medicine

## 2013-02-11 ENCOUNTER — Encounter (HOSPITAL_COMMUNITY)
Admission: RE | Admit: 2013-02-11 | Discharge: 2013-02-11 | Disposition: A | Discharge status: Home / Self Care | Payer: Medicare Other | Source: Ambulatory Visit | Attending: Internal Medicine | Admitting: Internal Medicine

## 2013-02-14 ENCOUNTER — Encounter (HOSPITAL_COMMUNITY)
Admission: RE | Admit: 2013-02-14 | Discharge: 2013-02-14 | Disposition: A | Discharge status: Home / Self Care | Payer: Medicare Other | Source: Ambulatory Visit | Attending: Internal Medicine | Admitting: Internal Medicine

## 2013-02-16 ENCOUNTER — Encounter (HOSPITAL_COMMUNITY)
Admission: RE | Admit: 2013-02-16 | Discharge: 2013-02-16 | Disposition: A | Discharge status: Home / Self Care | Payer: Medicare Other | Source: Ambulatory Visit | Attending: Internal Medicine | Admitting: Internal Medicine

## 2013-02-18 ENCOUNTER — Ambulatory Visit (INDEPENDENT_AMBULATORY_CARE_PROVIDER_SITE_OTHER): Payer: Medicare Other | Admitting: Internal Medicine

## 2013-02-18 ENCOUNTER — Encounter: Payer: Self-pay | Admitting: Internal Medicine

## 2013-02-18 ENCOUNTER — Ambulatory Visit (INDEPENDENT_AMBULATORY_CARE_PROVIDER_SITE_OTHER): Payer: Medicare Other | Admitting: *Deleted

## 2013-02-18 ENCOUNTER — Encounter (HOSPITAL_COMMUNITY)
Admission: RE | Admit: 2013-02-18 | Discharge: 2013-02-18 | Disposition: A | Discharge status: Home / Self Care | Payer: Medicare Other | Source: Ambulatory Visit | Attending: Internal Medicine | Admitting: Internal Medicine

## 2013-02-18 VITALS — BP 114/78 | HR 79 | Ht 69.0 in | Wt 199.0 lb

## 2013-02-18 DIAGNOSIS — Z7901 Long term (current) use of anticoagulants: Secondary | ICD-10-CM

## 2013-02-18 DIAGNOSIS — I5023 Acute on chronic systolic (congestive) heart failure: Secondary | ICD-10-CM | POA: Diagnosis not present

## 2013-02-18 DIAGNOSIS — I4891 Unspecified atrial fibrillation: Secondary | ICD-10-CM | POA: Diagnosis not present

## 2013-02-18 DIAGNOSIS — Z5181 Encounter for therapeutic drug level monitoring: Secondary | ICD-10-CM

## 2013-02-18 DIAGNOSIS — I429 Cardiomyopathy, unspecified: Secondary | ICD-10-CM

## 2013-02-18 DIAGNOSIS — Z95810 Presence of automatic (implantable) cardiac defibrillator: Secondary | ICD-10-CM

## 2013-02-18 DIAGNOSIS — I428 Other cardiomyopathies: Secondary | ICD-10-CM | POA: Diagnosis not present

## 2013-02-18 LAB — POCT INR: INR: 1.5

## 2013-02-18 NOTE — Assessment & Plan Note (Signed)
His Medtronic device is working normally. Will follow.

## 2013-02-18 NOTE — Patient Instructions (Signed)
Your physician recommends that you schedule a follow-up appointment in: 3 months with Dr Lovena Le

## 2013-02-18 NOTE — Assessment & Plan Note (Signed)
He is maintaining NSR on amio 400 mg daily. I have asked him to continue this dose another 3 months. Will plan to wean his amio when I see him back if he is maintaining NSR.

## 2013-02-18 NOTE — Progress Notes (Signed)
HPI [name]  Returns today for arrhythmia followup. He is a very pleasant 78 year old man with a nonischemic cardiomyopathy, chronic systolic heart failure , status post aortic valve replacement , and atrial fibrillation. He developed worsening heart failure and required mechanical ventilation after undergoing transesophageal echo. The patient ultimately was cardioverted and placed on fairly high dose amiodarone therapy. He improved. He was discharged home and his dry weight was thought to be around 205 pounds. He has done well in the interim and weighs in at home around 200 pounds. He denies noncompliance. Previously he had a history of noncompliance along with alcohol abuse. He denies alcohol abuse as well.  He is been maintained on amiodarone 400 mg daily. No ICD shocks. No Known Allergies   Current Outpatient Prescriptions  Medication Sig Dispense Refill  . allopurinol (ZYLOPRIM) 300 MG tablet Take 300 mg by mouth daily.        Marland Kitchen amiodarone (PACERONE) 400 MG tablet Take 0.5 tablets (200 mg total) by mouth 2 (two) times daily.  30 tablet  4  . amoxicillin (AMOXIL) 500 MG capsule Take 500 mg by mouth every other day.       Marland Kitchen aspirin EC 81 MG tablet Take 1 tablet (81 mg total) by mouth daily.      . carvedilol (COREG) 3.125 MG tablet Take 2 tablets (6.25 mg total) by mouth 2 (two) times daily with a meal.  120 tablet  4  . Coenzyme Q10 (CO Q-10 PO) Take 1 tablet by mouth daily.       . colchicine 0.6 MG tablet Take 0.6 mg by mouth daily as needed (gout pain).       . furosemide (LASIX) 40 MG tablet Take 40 mg by mouth daily. Take an extra dose of 40 mg if weight 205 lbs or greater.      Marland Kitchen lisinopril (PRINIVIL,ZESTRIL) 10 MG tablet Take 0.5 tablets (5 mg total) by mouth at bedtime.  30 tablet  4  . Memantine HCl ER (NAMENDA XR) 28 MG CP24 Take 28 mg by mouth daily.      . Multiple Vitamins-Minerals (MULTIVITAMIN WITH MINERALS) tablet Take 1 tablet by mouth daily.       . rosuvastatin  (CRESTOR) 10 MG tablet Take 10 mg by mouth daily.        Marland Kitchen spironolactone (ALDACTONE) 25 MG tablet Take 0.5 tablets (12.5 mg total) by mouth daily.  30 tablet  3  . thiamine (VITAMIN B-1) 100 MG tablet Take 1 tablet (100 mg total) by mouth 2 (two) times daily.  60 tablet  6  . traZODone (DESYREL) 50 MG tablet Take 150 mg by mouth at bedtime.       Marland Kitchen warfarin (COUMADIN) 2 MG tablet Take 1 tablet (2 mg total) by mouth daily.  90 tablet  1   No current facility-administered medications for this visit.     Past Medical History  Diagnosis Date  . Left bundle-branch block   . Cardiomyopathy   . Chronic systolic heart failure     ROS:   All systems reviewed and negative except as noted in the HPI.   Past Surgical History  Procedure Laterality Date  . Doppler echocardiography  2008, 2009  . Aortic valve replacement    . Tee without cardioversion N/A 10/15/2012    Procedure: TRANSESOPHAGEAL ECHOCARDIOGRAM (TEE);  Surgeon: Fay Records, MD;  Location: [place];  Service: Cardiovascular;  Laterality: N/A;  . Cardioversion N/A 10/25/2012  Procedure: CARDIOVERSION;  Surgeon: Evans Lance, MD;  Location: [place];  Service: Cardiovascular;  Laterality: N/A;  . Cardioversion N/A 12/20/2012    Procedure: CARDIOVERSION;  Surgeon: Larey Dresser, MD;  Location: [facility] ENDOSCOPY;  Service: Cardiovascular;  Laterality: N/A;     Family History  Problem Relation Age of Onset  . Aneurysm Mother     brain  . Heart attack Father 52     History   Social History  . Marital Status: Married    Spouse Name: N/A    Number of Children: N/A  . Years of Education: N/A   Occupational History  . Not on file.   Social History Main Topics  . Smoking status: Never Smoker   . Smokeless tobacco: Not on file  . Alcohol Use: 0.0 oz/week    2-3 Glasses of wine per week     Comment: 2-3 glasses  . Drug Use: No  . Sexual Activity: Not on file   Other Topics Concern  . Not on file   Social  History Narrative   Patient is married Jon Butler).   Patient is retired.     BP 114/78  Pulse 79  Ht 5\' 9"  (1.753 m)  Wt 199 lb (90.266 kg)  BMI 29.37 kg/m2  Physical Exam:  Well appearing 78 year old man, NAD HEENT: Unremarkable Neck:  No JVD, no thyromegally Back:  No CVA tenderness Lungs:  Clear  With no wheezes, rales, or rhonchi. HEART:  IRegular rate rhythm, no murmurs, no rubs, no clicks Abd:  soft, positive bowel sounds, no organomegally, no rebound, no guarding Ext:  2 plus pulses, no edema, no cyanosis, no clubbing Skin:  No rashes no nodules Neuro:  CN II through XII intact, motor grossly intact   DEVICE  Normal device function.  See PaceArt for details.   Assess/Plan:

## 2013-02-18 NOTE — Assessment & Plan Note (Signed)
His well compensated chronic systolic heart failure is stable. No change in medical therapy. Hopefully he will maintain NSR.

## 2013-02-21 ENCOUNTER — Encounter (HOSPITAL_COMMUNITY): Payer: Medicare Other

## 2013-02-23 ENCOUNTER — Encounter (HOSPITAL_COMMUNITY)
Admission: RE | Admit: 2013-02-23 | Discharge: 2013-02-23 | Disposition: A | Discharge status: Home / Self Care | Payer: Medicare Other | Source: Ambulatory Visit | Attending: Internal Medicine | Admitting: Internal Medicine

## 2013-02-23 LAB — MDC_IDC_ENUM_SESS_TYPE_INCLINIC
Brady Statistic AP VS Percent: 0.71 %
Brady Statistic AS VP Percent: 45.16 %
Brady Statistic AS VS Percent: 3.2 %
Date Time Interrogation Session: 20150213194340
HIGH POWER IMPEDANCE MEASURED VALUE: 228 Ohm
HIGH POWER IMPEDANCE MEASURED VALUE: 49 Ohm
HIGH POWER IMPEDANCE MEASURED VALUE: 60 Ohm
Lead Channel Impedance Value: 399 Ohm
Lead Channel Impedance Value: 456 Ohm
Lead Channel Impedance Value: 475 Ohm
Lead Channel Impedance Value: 513 Ohm
Lead Channel Impedance Value: 703 Ohm
Lead Channel Pacing Threshold Amplitude: 0.75 V
Lead Channel Pacing Threshold Amplitude: 1 V
Lead Channel Pacing Threshold Pulse Width: 0.4 ms
Lead Channel Pacing Threshold Pulse Width: 0.4 ms
Lead Channel Sensing Intrinsic Amplitude: 0.875 mV
Lead Channel Setting Pacing Amplitude: 2 V
Lead Channel Setting Pacing Amplitude: 2.5 V
Lead Channel Setting Sensing Sensitivity: 0.6 mV
MDC IDC MSMT BATTERY REMAINING LONGEVITY: 101 mo
MDC IDC MSMT BATTERY VOLTAGE: 3.01 V
MDC IDC MSMT LEADCHNL RV PACING THRESHOLD AMPLITUDE: 0.75 V
MDC IDC MSMT LEADCHNL RV PACING THRESHOLD PULSEWIDTH: 0.4 ms
MDC IDC MSMT LEADCHNL RV SENSING INTR AMPL: 21.875 mV
MDC IDC SET LEADCHNL LV PACING PULSEWIDTH: 0.4 ms
MDC IDC SET LEADCHNL RA PACING AMPLITUDE: 2 V
MDC IDC SET LEADCHNL RV PACING PULSEWIDTH: 0.4 ms
MDC IDC SET ZONE DETECTION INTERVAL: 360 ms
MDC IDC SET ZONE DETECTION INTERVAL: 400 ms
MDC IDC STAT BRADY AP VP PERCENT: 50.93 %
MDC IDC STAT BRADY RA PERCENT PACED: 51.64 %
MDC IDC STAT BRADY RV PERCENT PACED: 89.78 %
Zone Setting Detection Interval: 320 ms
Zone Setting Detection Interval: 430 ms

## 2013-02-25 ENCOUNTER — Encounter (HOSPITAL_COMMUNITY)
Admission: RE | Admit: 2013-02-25 | Discharge: 2013-02-25 | Disposition: A | Discharge status: Home / Self Care | Payer: Medicare Other | Source: Ambulatory Visit | Attending: Internal Medicine | Admitting: Internal Medicine

## 2013-02-25 ENCOUNTER — Ambulatory Visit (INDEPENDENT_AMBULATORY_CARE_PROVIDER_SITE_OTHER): Payer: Medicare Other | Admitting: *Deleted

## 2013-02-25 DIAGNOSIS — I4891 Unspecified atrial fibrillation: Secondary | ICD-10-CM

## 2013-02-25 DIAGNOSIS — Z5181 Encounter for therapeutic drug level monitoring: Secondary | ICD-10-CM | POA: Diagnosis not present

## 2013-02-25 DIAGNOSIS — I5022 Chronic systolic (congestive) heart failure: Secondary | ICD-10-CM | POA: Diagnosis not present

## 2013-02-25 DIAGNOSIS — R413 Other amnesia: Secondary | ICD-10-CM | POA: Diagnosis not present

## 2013-02-25 DIAGNOSIS — Z7901 Long term (current) use of anticoagulants: Secondary | ICD-10-CM

## 2013-02-25 DIAGNOSIS — Z95810 Presence of automatic (implantable) cardiac defibrillator: Secondary | ICD-10-CM | POA: Diagnosis not present

## 2013-02-25 DIAGNOSIS — I10 Essential (primary) hypertension: Secondary | ICD-10-CM | POA: Diagnosis not present

## 2013-02-25 DIAGNOSIS — E782 Mixed hyperlipidemia: Secondary | ICD-10-CM | POA: Diagnosis not present

## 2013-02-25 LAB — POCT INR: INR: 2

## 2013-02-28 ENCOUNTER — Encounter (HOSPITAL_COMMUNITY)
Admission: RE | Admit: 2013-02-28 | Discharge: 2013-02-28 | Disposition: A | Discharge status: Home / Self Care | Payer: Medicare Other | Source: Ambulatory Visit | Attending: Internal Medicine | Admitting: Internal Medicine

## 2013-03-02 ENCOUNTER — Encounter (HOSPITAL_COMMUNITY)
Admission: RE | Admit: 2013-03-02 | Discharge: 2013-03-02 | Disposition: A | Discharge status: Home / Self Care | Payer: Medicare Other | Source: Ambulatory Visit | Attending: Internal Medicine | Admitting: Internal Medicine

## 2013-03-04 ENCOUNTER — Encounter (HOSPITAL_COMMUNITY)
Admission: RE | Admit: 2013-03-04 | Discharge: 2013-03-04 | Disposition: A | Discharge status: Home / Self Care | Payer: Medicare Other | Source: Ambulatory Visit | Attending: Internal Medicine | Admitting: Internal Medicine

## 2013-03-04 NOTE — Progress Notes (Signed)
Jon Butler graduates and plans to continue exercise in the maintenance program.

## 2013-03-07 ENCOUNTER — Encounter (HOSPITAL_COMMUNITY)
Admission: RE | Admit: 2013-03-07 | Discharge: 2013-03-07 | Disposition: A | Discharge status: Home / Self Care | Payer: Self-pay | Source: Ambulatory Visit | Attending: Internal Medicine | Admitting: Internal Medicine

## 2013-03-07 DIAGNOSIS — Z95810 Presence of automatic (implantable) cardiac defibrillator: Secondary | ICD-10-CM | POA: Insufficient documentation

## 2013-03-07 DIAGNOSIS — Z5189 Encounter for other specified aftercare: Secondary | ICD-10-CM | POA: Insufficient documentation

## 2013-03-07 DIAGNOSIS — I69959 Hemiplegia and hemiparesis following unspecified cerebrovascular disease affecting unspecified side: Secondary | ICD-10-CM | POA: Insufficient documentation

## 2013-03-07 DIAGNOSIS — Z1211 Encounter for screening for malignant neoplasm of colon: Secondary | ICD-10-CM | POA: Diagnosis not present

## 2013-03-07 DIAGNOSIS — Z954 Presence of other heart-valve replacement: Secondary | ICD-10-CM | POA: Insufficient documentation

## 2013-03-07 DIAGNOSIS — J9610 Chronic respiratory failure, unspecified whether with hypoxia or hypercapnia: Secondary | ICD-10-CM | POA: Insufficient documentation

## 2013-03-09 ENCOUNTER — Encounter (HOSPITAL_COMMUNITY)
Admission: RE | Admit: 2013-03-09 | Discharge: 2013-03-09 | Disposition: A | Discharge status: Home / Self Care | Payer: Self-pay | Source: Ambulatory Visit | Attending: Internal Medicine | Admitting: Internal Medicine

## 2013-03-11 ENCOUNTER — Ambulatory Visit
Admission: RE | Admit: 2013-03-11 | Discharge: 2013-03-11 | Disposition: A | Discharge status: Home / Self Care | Payer: Medicare Other | Source: Ambulatory Visit | Attending: Internal Medicine | Admitting: Internal Medicine

## 2013-03-11 ENCOUNTER — Encounter (HOSPITAL_COMMUNITY): Payer: Self-pay

## 2013-03-11 ENCOUNTER — Other Ambulatory Visit: Payer: Self-pay | Admitting: Internal Medicine

## 2013-03-11 ENCOUNTER — Ambulatory Visit (INDEPENDENT_AMBULATORY_CARE_PROVIDER_SITE_OTHER): Payer: Medicare Other

## 2013-03-11 DIAGNOSIS — J069 Acute upper respiratory infection, unspecified: Secondary | ICD-10-CM | POA: Diagnosis not present

## 2013-03-11 DIAGNOSIS — R059 Cough, unspecified: Secondary | ICD-10-CM | POA: Diagnosis not present

## 2013-03-11 DIAGNOSIS — I4891 Unspecified atrial fibrillation: Secondary | ICD-10-CM

## 2013-03-11 DIAGNOSIS — Z95810 Presence of automatic (implantable) cardiac defibrillator: Secondary | ICD-10-CM

## 2013-03-11 DIAGNOSIS — Z5181 Encounter for therapeutic drug level monitoring: Secondary | ICD-10-CM | POA: Diagnosis not present

## 2013-03-11 DIAGNOSIS — R05 Cough: Secondary | ICD-10-CM | POA: Diagnosis not present

## 2013-03-11 DIAGNOSIS — Z7901 Long term (current) use of anticoagulants: Secondary | ICD-10-CM

## 2013-03-11 LAB — POCT INR: INR: 1.9

## 2013-03-14 ENCOUNTER — Encounter (HOSPITAL_COMMUNITY): Payer: Self-pay

## 2013-03-16 ENCOUNTER — Encounter (HOSPITAL_COMMUNITY)
Admission: RE | Admit: 2013-03-16 | Discharge: 2013-03-16 | Disposition: A | Discharge status: Home / Self Care | Payer: Self-pay | Source: Ambulatory Visit | Attending: Internal Medicine | Admitting: Internal Medicine

## 2013-03-18 ENCOUNTER — Encounter (HOSPITAL_COMMUNITY)
Admission: RE | Admit: 2013-03-18 | Discharge: 2013-03-18 | Disposition: A | Discharge status: Home / Self Care | Payer: Self-pay | Source: Ambulatory Visit | Attending: Internal Medicine | Admitting: Internal Medicine

## 2013-03-21 ENCOUNTER — Encounter (HOSPITAL_COMMUNITY)
Admission: RE | Admit: 2013-03-21 | Discharge: 2013-03-21 | Disposition: A | Discharge status: Home / Self Care | Payer: Self-pay | Source: Ambulatory Visit | Attending: Internal Medicine | Admitting: Internal Medicine

## 2013-03-23 ENCOUNTER — Encounter (HOSPITAL_COMMUNITY)
Admission: RE | Admit: 2013-03-23 | Discharge: 2013-03-23 | Disposition: A | Discharge status: Home / Self Care | Payer: Self-pay | Source: Ambulatory Visit | Attending: Internal Medicine | Admitting: Internal Medicine

## 2013-03-23 ENCOUNTER — Encounter: Payer: Self-pay | Admitting: Neurology

## 2013-03-23 ENCOUNTER — Encounter (INDEPENDENT_AMBULATORY_CARE_PROVIDER_SITE_OTHER): Payer: Self-pay

## 2013-03-23 ENCOUNTER — Ambulatory Visit (INDEPENDENT_AMBULATORY_CARE_PROVIDER_SITE_OTHER): Payer: Medicare Other | Admitting: Neurology

## 2013-03-23 VITALS — BP 104/64 | HR 66 | Ht 70.0 in | Wt 202.0 lb

## 2013-03-23 DIAGNOSIS — E039 Hypothyroidism, unspecified: Secondary | ICD-10-CM | POA: Diagnosis not present

## 2013-03-23 DIAGNOSIS — E538 Deficiency of other specified B group vitamins: Secondary | ICD-10-CM

## 2013-03-23 DIAGNOSIS — F09 Unspecified mental disorder due to known physiological condition: Secondary | ICD-10-CM

## 2013-03-23 DIAGNOSIS — R4189 Other symptoms and signs involving cognitive functions and awareness: Secondary | ICD-10-CM

## 2013-03-23 MED ORDER — DONEPEZIL HCL 10 MG PO TABS: 10.0000 mg | ORAL_TABLET | Freq: Every day | ORAL | Status: DC

## 2013-03-23 NOTE — Progress Notes (Signed)
[facility]    Provider:  Dr Janann Colonel Referring Provider: [name], MD Primary Care Physician:  [name], MD  CC:  Memory decline  HPI:  Jon Butler is a 78 y.o. male here as a referral from Dr. Lysle Rubens for memory decline. Currently taking Aricept 5mg  nightly and Namenda 28mg  ER.   Patient denies any concerns. Wife believes problem started around 1.5 years ago. Mainly a problem with short term memory, forgetting day to day things. They feel it is getting progressively worse with a noted drastic decline after event in October. Trouble recalling why he went into a room, forgets talking to people. Wife notes good remote memory.  Takes trazodone to help with his sleep, does not sleep well without it. No hallucinations. Started Namenda in August 2014, Aricept started this month. States he drinks "a few glasses of wine" a day.  He is not currently driving, stopped in October due to LOC from heart failure, he was intubated for respiratory failure at this time and spent 15 days in the hospital. Has artificial aortic valve replacement and ICD in place, currently taking coumadin.   Review of Systems: Out of a complete 14 system review, the patient complains of only the following symptoms, and all other reviewed systems are negative. + easy bruising, easy bleeding  History   Social History  . Marital Status: Married    Spouse Name: [name]    Number of Children: 5  . Years of Education: 12   Occupational History  .     Social History Main Topics  . Smoking status: Never Smoker   . Smokeless tobacco: Never Used  . Alcohol Use: 0.0 oz/week    2-3 Glasses of wine per week     Comment: 2-3 glasses  . Drug Use: No  . Sexual Activity: Not on file   Other Topics Concern  . Not on file   Social History Narrative   Patient is married [name]).   Patient is retired.   5 children    12th grade   2 cups caffeine daily    Family History  Problem Relation  Age of Onset  . Aneurysm Mother     brain  . Heart attack Father 72    Past Medical History  Diagnosis Date  . Left bundle-branch block   . Cardiomyopathy   . Chronic systolic heart failure     Past Surgical History  Procedure Laterality Date  . Doppler echocardiography  2008, 2009  . Aortic valve replacement    . Tee without cardioversion N/A 10/15/2012    Procedure: TRANSESOPHAGEAL ECHOCARDIOGRAM (TEE);  Surgeon: Fay Records, MD;  Location: [facility] ENDOSCOPY;  Service: Cardiovascular;  Laterality: N/A;  . Cardioversion N/A 10/25/2012    Procedure: CARDIOVERSION;  Surgeon: Evans Lance, MD;  Location: [place];  Service: Cardiovascular;  Laterality: N/A;  . Cardioversion N/A 12/20/2012    Procedure: CARDIOVERSION;  Surgeon: Larey Dresser, MD;  Location: [place];  Service: Cardiovascular;  Laterality: N/A;    Current Outpatient Prescriptions  Medication Sig Dispense Refill  . allopurinol (ZYLOPRIM) 300 MG tablet Take 200 mg by mouth daily.       Marland Kitchen amiodarone (PACERONE) 400 MG tablet Take 0.5 tablets (200 mg total) by mouth 2 (two) times daily.  30 tablet  4  . amoxicillin (AMOXIL) 500 MG capsule Take 500 mg by mouth every other day.       Marland Kitchen aspirin EC 81 MG tablet Take 1 tablet (81  mg total) by mouth daily.      . carvedilol (COREG) 3.125 MG tablet Take 2 tablets (6.25 mg total) by mouth 2 (two) times daily with a meal.  120 tablet  4  . Coenzyme Q10 (CO Q-10 PO) Take 1 tablet by mouth daily.       . colchicine 0.6 MG tablet Take 0.6 mg by mouth daily as needed (gout pain).       Marland Kitchen donepezil (ARICEPT) 5 MG tablet Take 5 mg by mouth at bedtime.      . furosemide (LASIX) 40 MG tablet Take 40 mg by mouth daily. Take an extra dose of 40 mg if weight 205 lbs or greater.      Marland Kitchen lisinopril (PRINIVIL,ZESTRIL) 10 MG tablet Take 0.5 tablets (5 mg total) by mouth at bedtime.  30 tablet  4  . Memantine HCl ER (NAMENDA XR) 28 MG CP24 Take 28 mg by mouth daily.      . Multiple  Vitamins-Minerals (MULTIVITAMIN WITH MINERALS) tablet Take 1 tablet by mouth daily.       . rosuvastatin (CRESTOR) 10 MG tablet Take 10 mg by mouth daily.        Marland Kitchen spironolactone (ALDACTONE) 25 MG tablet Take 0.5 tablets (12.5 mg total) by mouth daily.  30 tablet  3  . thiamine (VITAMIN B-1) 100 MG tablet Take 1 tablet (100 mg total) by mouth 2 (two) times daily.  60 tablet  6  . traZODone (DESYREL) 50 MG tablet Take 150 mg by mouth at bedtime.       Marland Kitchen warfarin (COUMADIN) 2 MG tablet Take 1 tablet (2 mg total) by mouth daily.  90 tablet  1   No current facility-administered medications for this visit.    Allergies as of 03/23/2013  . (No Known Allergies)    Vitals: Ht 5\' 10"  (1.778 m)  Wt 202 lb (91.627 kg)  BMI 28.98 kg/m2 Last Weight:  Wt Readings from Last 1 Encounters:  03/23/13 202 lb (91.627 kg)   Last Height:   Ht Readings from Last 1 Encounters:  03/23/13 5\' 10"  (1.778 m)     Physical exam: Exam: Gen: NAD, conversant Eyes: anicteric sclerae, moist conjunctivae HENT: Atraumatic, oropharynx clear Neck: Trachea midline; supple,  Lungs: CTA, no wheezing, rales, rhonic                          CV: RRR, no MRG Abdomen: Soft, non-tender;  Extremities: No peripheral edema  Skin: Normal temperature, no rash,  Psych: Appropriate affect, pleasant  Neuro: MS:  MOCA 19/30  CN: PERRL, EOMI no nystagmus, no ptosis, sensation intact to LT V1-V3 bilat, face symmetric, no weakness, hearing grossly intact, palate elevates symmetrically, shoulder shrug 5/5 bilat,  tongue protrudes midline, no fasiculations noted.  Motor: normal bulk and tone Strength: 5/5  In all extremities  Coord: rapid alternating and point-to-point (FNF, HTS) movements intact.  Reflexes: brisk but symmetrical, bilat downgoing toes  Sens: LT intact in all extremities  Gait: posture, stance, stride and arm-swing normal. Tandem gait intact. Able to walk on heels and toes. Romberg  absent.   Assessment:  After physical and neurologic examination, review of laboratory studies, imaging, neurophysiology testing and pre-existing records, assessment will be reviewed on the problem list.  Plan:    1)Cogntivie decline  77y/o gentleman presenting for initial evaluation of cognitive decline. Wife notes symptoms progressively worsened after episode of respiratory arrest requiring intubation in the fall of  2014. Patient denies any current concerns. Exam pertinent for MOCA of 19/30. Will check blood work and head CT. Continue Namenda XR 28mg  daily, will increase Aricept to 10mg  nightly. Follow up in 4 months.  Jim Like, DO  [facility] [address] [place], [place] [address]  Phone [phone] Fax [phone]

## 2013-03-23 NOTE — Patient Instructions (Signed)
Overall you are doing fairly well but I do want to suggest a few things today:   As far as your medications are concerned, I would like to suggest the following: 1)Please increase your Aricept to 10mg  nightly (you can take two 5mg  tablets until you use them up) 2)Continue Namenda XR 28mg  daily  As far as diagnostic testing:  1)Please return for some blood work 2)I would like you to have a head CT done, you will be called to schedule this  I would like to see you back in 4 moths, sooner if we need to. Please call us with any interim questions, concerns, problems, updates or refill requests.   My clinical assistant and will answer any of your questions and relay your messages to me and also relay most of my messages to you.   Our phone number is [phone]. We also have an after hours call service for urgent matters and there is a physician on-call for urgent questions. For any emergencies you know to call 911 or go to the nearest emergency room

## 2013-03-25 ENCOUNTER — Encounter (HOSPITAL_COMMUNITY)
Admission: RE | Admit: 2013-03-25 | Discharge: 2013-03-25 | Disposition: A | Discharge status: Home / Self Care | Payer: Self-pay | Source: Ambulatory Visit | Attending: Internal Medicine | Admitting: Internal Medicine

## 2013-03-28 ENCOUNTER — Ambulatory Visit
Admission: RE | Admit: 2013-03-28 | Discharge: 2013-03-28 | Disposition: A | Discharge status: Home / Self Care | Payer: Medicare Other | Source: Ambulatory Visit | Attending: Neurology | Admitting: Neurology

## 2013-03-28 ENCOUNTER — Encounter (HOSPITAL_COMMUNITY)
Admission: RE | Admit: 2013-03-28 | Discharge: 2013-03-28 | Disposition: A | Discharge status: Home / Self Care | Payer: Self-pay | Source: Ambulatory Visit | Attending: Internal Medicine | Admitting: Internal Medicine

## 2013-03-28 DIAGNOSIS — F09 Unspecified mental disorder due to known physiological condition: Secondary | ICD-10-CM | POA: Diagnosis not present

## 2013-03-28 DIAGNOSIS — R4189 Other symptoms and signs involving cognitive functions and awareness: Secondary | ICD-10-CM

## 2013-03-30 ENCOUNTER — Other Ambulatory Visit (INDEPENDENT_AMBULATORY_CARE_PROVIDER_SITE_OTHER): Payer: Self-pay

## 2013-03-30 ENCOUNTER — Encounter (HOSPITAL_COMMUNITY)
Admission: RE | Admit: 2013-03-30 | Discharge: 2013-03-30 | Disposition: A | Discharge status: Home / Self Care | Payer: Self-pay | Source: Ambulatory Visit | Attending: Internal Medicine | Admitting: Internal Medicine

## 2013-03-30 DIAGNOSIS — F09 Unspecified mental disorder due to known physiological condition: Secondary | ICD-10-CM | POA: Diagnosis not present

## 2013-03-30 DIAGNOSIS — E538 Deficiency of other specified B group vitamins: Secondary | ICD-10-CM | POA: Diagnosis not present

## 2013-03-30 DIAGNOSIS — R4189 Other symptoms and signs involving cognitive functions and awareness: Secondary | ICD-10-CM

## 2013-03-30 DIAGNOSIS — E039 Hypothyroidism, unspecified: Secondary | ICD-10-CM

## 2013-03-30 DIAGNOSIS — Z0289 Encounter for other administrative examinations: Secondary | ICD-10-CM

## 2013-03-30 NOTE — Progress Notes (Signed)
Quick Note:  Spoke with patient's wife informed her of normal CT, expressed understanding, stated that the patient will be here today to get his blood work done. ______

## 2013-04-01 ENCOUNTER — Ambulatory Visit (INDEPENDENT_AMBULATORY_CARE_PROVIDER_SITE_OTHER): Payer: Medicare Other

## 2013-04-01 ENCOUNTER — Encounter (HOSPITAL_COMMUNITY)
Admission: RE | Admit: 2013-04-01 | Discharge: 2013-04-01 | Disposition: A | Discharge status: Home / Self Care | Payer: Self-pay | Source: Ambulatory Visit | Attending: Internal Medicine | Admitting: Internal Medicine

## 2013-04-01 DIAGNOSIS — Z7901 Long term (current) use of anticoagulants: Secondary | ICD-10-CM

## 2013-04-01 DIAGNOSIS — I4891 Unspecified atrial fibrillation: Secondary | ICD-10-CM | POA: Diagnosis not present

## 2013-04-01 DIAGNOSIS — Z95810 Presence of automatic (implantable) cardiac defibrillator: Secondary | ICD-10-CM | POA: Diagnosis not present

## 2013-04-01 DIAGNOSIS — Z5181 Encounter for therapeutic drug level monitoring: Secondary | ICD-10-CM

## 2013-04-01 LAB — POCT INR: INR: 2.5

## 2013-04-02 LAB — TSH: TSH: 1.53 u[IU]/mL (ref 0.450–4.500)

## 2013-04-02 LAB — METHYLMALONIC ACID, SERUM: METHYLMALONIC ACID: 248 nmol/L (ref 0–378)

## 2013-04-02 LAB — VITAMIN B12

## 2013-04-02 LAB — VITAMIN B1, WHOLE BLOOD: Thiamine: 294.2 nmol/L — ABNORMAL HIGH (ref 66.5–200.0)

## 2013-04-04 ENCOUNTER — Encounter (HOSPITAL_COMMUNITY)
Admission: RE | Admit: 2013-04-04 | Discharge: 2013-04-04 | Disposition: A | Discharge status: Home / Self Care | Payer: Self-pay | Source: Ambulatory Visit | Attending: Internal Medicine | Admitting: Internal Medicine

## 2013-04-06 ENCOUNTER — Encounter (HOSPITAL_COMMUNITY)
Admission: RE | Admit: 2013-04-06 | Discharge: 2013-04-06 | Disposition: A | Discharge status: Home / Self Care | Payer: Self-pay | Source: Ambulatory Visit | Attending: Internal Medicine | Admitting: Internal Medicine

## 2013-04-06 DIAGNOSIS — J9610 Chronic respiratory failure, unspecified whether with hypoxia or hypercapnia: Secondary | ICD-10-CM | POA: Insufficient documentation

## 2013-04-06 DIAGNOSIS — Z5189 Encounter for other specified aftercare: Secondary | ICD-10-CM | POA: Insufficient documentation

## 2013-04-06 DIAGNOSIS — I69959 Hemiplegia and hemiparesis following unspecified cerebrovascular disease affecting unspecified side: Secondary | ICD-10-CM | POA: Insufficient documentation

## 2013-04-06 DIAGNOSIS — Z954 Presence of other heart-valve replacement: Secondary | ICD-10-CM | POA: Insufficient documentation

## 2013-04-06 DIAGNOSIS — Z95810 Presence of automatic (implantable) cardiac defibrillator: Secondary | ICD-10-CM | POA: Insufficient documentation

## 2013-04-08 ENCOUNTER — Encounter (HOSPITAL_COMMUNITY)
Admission: RE | Admit: 2013-04-08 | Discharge: 2013-04-08 | Disposition: A | Discharge status: Home / Self Care | Payer: Self-pay | Source: Ambulatory Visit | Attending: Internal Medicine | Admitting: Internal Medicine

## 2013-04-11 ENCOUNTER — Encounter (HOSPITAL_COMMUNITY)
Admission: RE | Admit: 2013-04-11 | Discharge: 2013-04-11 | Disposition: A | Discharge status: Home / Self Care | Payer: Self-pay | Source: Ambulatory Visit | Attending: Internal Medicine | Admitting: Internal Medicine

## 2013-04-13 ENCOUNTER — Encounter (HOSPITAL_COMMUNITY)
Admission: RE | Admit: 2013-04-13 | Discharge: 2013-04-13 | Disposition: A | Discharge status: Home / Self Care | Payer: Self-pay | Source: Ambulatory Visit | Attending: Internal Medicine | Admitting: Internal Medicine

## 2013-04-15 ENCOUNTER — Encounter (HOSPITAL_COMMUNITY)
Admission: RE | Admit: 2013-04-15 | Discharge: 2013-04-15 | Disposition: A | Discharge status: Home / Self Care | Payer: Self-pay | Source: Ambulatory Visit | Attending: Internal Medicine | Admitting: Internal Medicine

## 2013-04-18 ENCOUNTER — Encounter (HOSPITAL_COMMUNITY): Payer: Self-pay

## 2013-04-20 ENCOUNTER — Encounter (HOSPITAL_COMMUNITY)
Admission: RE | Admit: 2013-04-20 | Discharge: 2013-04-20 | Disposition: A | Discharge status: Home / Self Care | Payer: Self-pay | Source: Ambulatory Visit | Attending: Internal Medicine | Admitting: Internal Medicine

## 2013-04-22 ENCOUNTER — Encounter (HOSPITAL_COMMUNITY): Payer: Self-pay

## 2013-04-25 ENCOUNTER — Encounter (HOSPITAL_COMMUNITY): Payer: Self-pay

## 2013-04-27 ENCOUNTER — Encounter (HOSPITAL_COMMUNITY): Payer: Self-pay

## 2013-04-29 ENCOUNTER — Encounter (HOSPITAL_COMMUNITY): Payer: Self-pay

## 2013-05-02 ENCOUNTER — Encounter (HOSPITAL_COMMUNITY): Payer: Self-pay

## 2013-05-04 ENCOUNTER — Encounter (HOSPITAL_COMMUNITY): Payer: Self-pay

## 2013-05-06 ENCOUNTER — Encounter (HOSPITAL_COMMUNITY)
Admission: RE | Admit: 2013-05-06 | Discharge: 2013-05-06 | Disposition: A | Discharge status: Home / Self Care | Payer: Self-pay | Source: Ambulatory Visit | Attending: Internal Medicine | Admitting: Internal Medicine

## 2013-05-06 ENCOUNTER — Ambulatory Visit (INDEPENDENT_AMBULATORY_CARE_PROVIDER_SITE_OTHER): Payer: Medicare Other

## 2013-05-06 DIAGNOSIS — I4891 Unspecified atrial fibrillation: Secondary | ICD-10-CM | POA: Diagnosis not present

## 2013-05-06 DIAGNOSIS — Z95810 Presence of automatic (implantable) cardiac defibrillator: Secondary | ICD-10-CM

## 2013-05-06 DIAGNOSIS — Z5189 Encounter for other specified aftercare: Secondary | ICD-10-CM | POA: Insufficient documentation

## 2013-05-06 DIAGNOSIS — Z7901 Long term (current) use of anticoagulants: Secondary | ICD-10-CM

## 2013-05-06 DIAGNOSIS — Z5181 Encounter for therapeutic drug level monitoring: Secondary | ICD-10-CM

## 2013-05-06 DIAGNOSIS — I69959 Hemiplegia and hemiparesis following unspecified cerebrovascular disease affecting unspecified side: Secondary | ICD-10-CM | POA: Insufficient documentation

## 2013-05-06 DIAGNOSIS — Z954 Presence of other heart-valve replacement: Secondary | ICD-10-CM | POA: Insufficient documentation

## 2013-05-06 DIAGNOSIS — J9610 Chronic respiratory failure, unspecified whether with hypoxia or hypercapnia: Secondary | ICD-10-CM | POA: Insufficient documentation

## 2013-05-06 LAB — POCT INR: INR: 2.6

## 2013-05-09 ENCOUNTER — Encounter (HOSPITAL_COMMUNITY)
Admission: RE | Admit: 2013-05-09 | Discharge: 2013-05-09 | Disposition: A | Discharge status: Home / Self Care | Payer: Self-pay | Source: Ambulatory Visit | Attending: Internal Medicine | Admitting: Internal Medicine

## 2013-05-11 ENCOUNTER — Encounter (HOSPITAL_COMMUNITY)
Admission: RE | Admit: 2013-05-11 | Discharge: 2013-05-11 | Disposition: A | Discharge status: Home / Self Care | Payer: Self-pay | Source: Ambulatory Visit | Attending: Internal Medicine | Admitting: Internal Medicine

## 2013-05-13 ENCOUNTER — Encounter (HOSPITAL_COMMUNITY)
Admission: RE | Admit: 2013-05-13 | Discharge: 2013-05-13 | Disposition: A | Discharge status: Home / Self Care | Payer: Self-pay | Source: Ambulatory Visit | Attending: Internal Medicine | Admitting: Internal Medicine

## 2013-05-16 ENCOUNTER — Encounter (HOSPITAL_COMMUNITY)
Admission: RE | Admit: 2013-05-16 | Discharge: 2013-05-16 | Disposition: A | Discharge status: Home / Self Care | Payer: Self-pay | Source: Ambulatory Visit | Attending: Internal Medicine | Admitting: Internal Medicine

## 2013-05-16 ENCOUNTER — Other Ambulatory Visit (HOSPITAL_COMMUNITY): Payer: Self-pay | Admitting: Cardiology

## 2013-05-18 ENCOUNTER — Encounter (HOSPITAL_COMMUNITY)
Admission: RE | Admit: 2013-05-18 | Discharge: 2013-05-18 | Disposition: A | Discharge status: Home / Self Care | Payer: Self-pay | Source: Ambulatory Visit | Attending: Internal Medicine | Admitting: Internal Medicine

## 2013-05-20 ENCOUNTER — Encounter (HOSPITAL_COMMUNITY)
Admission: RE | Admit: 2013-05-20 | Discharge: 2013-05-20 | Disposition: A | Discharge status: Home / Self Care | Payer: Self-pay | Source: Ambulatory Visit | Attending: Internal Medicine | Admitting: Internal Medicine

## 2013-05-23 ENCOUNTER — Encounter (HOSPITAL_COMMUNITY)
Admission: RE | Admit: 2013-05-23 | Discharge: 2013-05-23 | Disposition: A | Discharge status: Home / Self Care | Payer: Self-pay | Source: Ambulatory Visit | Attending: Internal Medicine | Admitting: Internal Medicine

## 2013-05-24 ENCOUNTER — Ambulatory Visit (INDEPENDENT_AMBULATORY_CARE_PROVIDER_SITE_OTHER): Payer: Medicare Other | Admitting: Internal Medicine

## 2013-05-24 ENCOUNTER — Encounter: Payer: Self-pay | Admitting: Internal Medicine

## 2013-05-24 VITALS — BP 124/75 | HR 83 | Ht 70.0 in | Wt 198.0 lb

## 2013-05-24 DIAGNOSIS — Z95810 Presence of automatic (implantable) cardiac defibrillator: Secondary | ICD-10-CM

## 2013-05-24 DIAGNOSIS — I428 Other cardiomyopathies: Secondary | ICD-10-CM

## 2013-05-24 DIAGNOSIS — I4891 Unspecified atrial fibrillation: Secondary | ICD-10-CM

## 2013-05-24 DIAGNOSIS — I5023 Acute on chronic systolic (congestive) heart failure: Secondary | ICD-10-CM

## 2013-05-24 DIAGNOSIS — I429 Cardiomyopathy, unspecified: Secondary | ICD-10-CM

## 2013-05-24 DIAGNOSIS — I5022 Chronic systolic (congestive) heart failure: Secondary | ICD-10-CM | POA: Diagnosis not present

## 2013-05-24 LAB — MDC_IDC_ENUM_SESS_TYPE_INCLINIC
Battery Remaining Longevity: 94 mo
Battery Voltage: 3 V
Brady Statistic AP VP Percent: 77.67 %
Brady Statistic AS VP Percent: 20.72 %
Brady Statistic AS VS Percent: 1 %
Brady Statistic RA Percent Paced: 78.28 %
Brady Statistic RV Percent Paced: 94.97 %
HighPow Impedance: 228 Ohm
HighPow Impedance: 50 Ohm
HighPow Impedance: 62 Ohm
Lead Channel Impedance Value: 513 Ohm
Lead Channel Pacing Threshold Amplitude: 0.75 V
Lead Channel Pacing Threshold Amplitude: 1 V
Lead Channel Pacing Threshold Pulse Width: 0.4 ms
Lead Channel Pacing Threshold Pulse Width: 0.4 ms
Lead Channel Sensing Intrinsic Amplitude: 28.625 mV
Lead Channel Setting Pacing Amplitude: 2 V
Lead Channel Setting Pacing Amplitude: 2.5 V
Lead Channel Setting Pacing Pulse Width: 0.4 ms
Lead Channel Setting Pacing Pulse Width: 0.4 ms
Lead Channel Setting Sensing Sensitivity: 0.6 mV
MDC IDC MSMT LEADCHNL LV IMPEDANCE VALUE: 399 Ohm
MDC IDC MSMT LEADCHNL LV IMPEDANCE VALUE: 760 Ohm
MDC IDC MSMT LEADCHNL LV PACING THRESHOLD AMPLITUDE: 1.25 V
MDC IDC MSMT LEADCHNL LV PACING THRESHOLD PULSEWIDTH: 0.4 ms
MDC IDC MSMT LEADCHNL RA IMPEDANCE VALUE: 456 Ohm
MDC IDC MSMT LEADCHNL RA SENSING INTR AMPL: 0.75 mV
MDC IDC MSMT LEADCHNL RV IMPEDANCE VALUE: 456 Ohm
MDC IDC SESS DTM: 20150519160911
MDC IDC SET LEADCHNL RA PACING AMPLITUDE: 2 V
MDC IDC SET ZONE DETECTION INTERVAL: 320 ms
MDC IDC SET ZONE DETECTION INTERVAL: 360 ms
MDC IDC STAT BRADY AP VS PERCENT: 0.61 %
Zone Setting Detection Interval: 400 ms
Zone Setting Detection Interval: 430 ms

## 2013-05-24 NOTE — Assessment & Plan Note (Signed)
He is maintaining NSR on 200 mg of amiodarone a day. Hopefully he will continue NSR.

## 2013-05-24 NOTE — Assessment & Plan Note (Signed)
His BiV ICD is working normally. Will recheck in several months.  

## 2013-05-24 NOTE — Assessment & Plan Note (Signed)
His symptoms are well compensated and he is participating in cardiac rehab. I have asked the patient to continue his current meds.

## 2013-05-24 NOTE — Progress Notes (Signed)
HPI [name]  Returns today for arrhythmia followup. He is a very pleasant 78 year old man with a nonischemic cardiomyopathy, chronic systolic heart failure , status post aortic valve replacement , and atrial fibrillation. He developed worsening heart failure and required mechanical ventilation after undergoing transesophageal echo. The patient ultimately was cardioverted and placed on fairly high dose amiodarone therapy. He improved. He was discharged home and his dry weight was thought to be around 205 pounds. He has done well in the interim and weighs in at home around 200 pounds. He denies noncompliance. Previously he had a history of noncompliance along with alcohol abuse. He denies alcohol abuse as well.  He is been maintained on amiodarone 200 mg daily. No ICD shocks. No Known Allergies   Current Outpatient Prescriptions  Medication Sig Dispense Refill  . allopurinol (ZYLOPRIM) 300 MG tablet Take 200 mg by mouth daily.       Marland Kitchen amiodarone (PACERONE) 400 MG tablet Take 0.5 tablets (200 mg total) by mouth 2 (two) times daily.  30 tablet  4  . amoxicillin (AMOXIL) 500 MG capsule Take 500 mg by mouth every other day.       Marland Kitchen aspirin EC 81 MG tablet Take 1 tablet (81 mg total) by mouth daily.      . carvedilol (COREG) 3.125 MG tablet Take 2 tablets (6.25 mg total) by mouth 2 (two) times daily with a meal.  120 tablet  4  . Coenzyme Q10 (CO Q-10 PO) Take 1 tablet by mouth daily.       . colchicine 0.6 MG tablet Take 0.6 mg by mouth daily as needed (gout pain).       Marland Kitchen donepezil (ARICEPT) 10 MG tablet Take 1 tablet (10 mg total) by mouth at bedtime.  30 tablet  3  . furosemide (LASIX) 40 MG tablet Take 40 mg by mouth daily. Take an extra dose of 40 mg if weight 205 lbs or greater.      Marland Kitchen lisinopril (PRINIVIL,ZESTRIL) 10 MG tablet Take 0.5 tablets (5 mg total) by mouth at bedtime.  30 tablet  4  . Memantine HCl ER (NAMENDA XR) 28 MG CP24 Take 28 mg by mouth daily.      . Multiple  Vitamins-Minerals (MULTIVITAMIN WITH MINERALS) tablet Take 1 tablet by mouth daily.       . rosuvastatin (CRESTOR) 10 MG tablet Take 10 mg by mouth daily.        Marland Kitchen spironolactone (ALDACTONE) 25 MG tablet Take 0.5 tablets (12.5 mg total) by mouth daily.  30 tablet  3  . thiamine (VITAMIN B-1) 100 MG tablet Take 1 tablet (100 mg total) by mouth 2 (two) times daily.  60 tablet  6  . traZODone (DESYREL) 50 MG tablet Take 150 mg by mouth at bedtime.       Marland Kitchen warfarin (COUMADIN) 2 MG tablet Take 1 tablet (2 mg total) by mouth daily.  90 tablet  1   No current facility-administered medications for this visit.     Past Medical History  Diagnosis Date  . Left bundle-branch block   . Cardiomyopathy   . Chronic systolic heart failure     ROS:   All systems reviewed and negative except as noted in the HPI.   Past Surgical History  Procedure Laterality Date  . Doppler echocardiography  2008, 2009  . Aortic valve replacement    . Tee without cardioversion N/A 10/15/2012    Procedure: TRANSESOPHAGEAL ECHOCARDIOGRAM (TEE);  Surgeon: Fay Records, MD;  Location: [facility] ENDOSCOPY;  Service: Cardiovascular;  Laterality: N/A;  . Cardioversion N/A 10/25/2012    Procedure: CARDIOVERSION;  Surgeon: Evans Lance, MD;  Location: [place];  Service: Cardiovascular;  Laterality: N/A;  . Cardioversion N/A 12/20/2012    Procedure: CARDIOVERSION;  Surgeon: Larey Dresser, MD;  Location: [facility] ENDOSCOPY;  Service: Cardiovascular;  Laterality: N/A;     Family History  Problem Relation Age of Onset  . Aneurysm Mother     brain  . Heart attack Father 35     History   Social History  . Marital Status: Married    Spouse Name: Jon Butler    Number of Children: 5  . Years of Education: 12   Occupational History  .     Social History Main Topics  . Smoking status: Never Smoker   . Smokeless tobacco: Never Used  . Alcohol Use: 0.0 oz/week    2-3 Glasses of wine per week     Comment: 2-3 glasses  . Drug Use:  No  . Sexual Activity: Not on file   Other Topics Concern  . Not on file   Social History Narrative   Patient is married Jon Butler).   Patient is retired.   5 children    12th grade   2 cups caffeine daily     BP 124/75  Pulse 83  Ht 5\' 10"  (1.778 m)  Wt 198 lb (89.812 kg)  BMI 28.41 kg/m2  Physical Exam:  Well appearing 78 year old man, NAD HEENT: Unremarkable Neck:  6 cm JVD, no thyromegally Back:  No CVA tenderness Lungs:  Clear  With no wheezes, rales, or rhonchi. HEART:  IRegular rate rhythm, no murmurs, no rubs, no clicks Abd:  soft, positive bowel sounds, no organomegally, no rebound, no guarding Ext:  2 plus pulses, no edema, no cyanosis, no clubbing Skin:  No rashes no nodules Neuro:  CN II through XII intact, motor grossly intact   DEVICE  Normal device function.  See PaceArt for details.   Assess/Plan:

## 2013-05-24 NOTE — Patient Instructions (Signed)
Your physician wants you to follow-up in: 6 months with Dr Taylor You will receive a reminder letter in the mail two months in advance. If you don't receive a letter, please call our office to schedule the follow-up appointment.  

## 2013-05-25 ENCOUNTER — Encounter (HOSPITAL_COMMUNITY)
Admission: RE | Admit: 2013-05-25 | Discharge: 2013-05-25 | Disposition: A | Discharge status: Home / Self Care | Payer: Self-pay | Source: Ambulatory Visit | Attending: Internal Medicine | Admitting: Internal Medicine

## 2013-05-25 DIAGNOSIS — M109 Gout, unspecified: Secondary | ICD-10-CM | POA: Diagnosis not present

## 2013-05-25 DIAGNOSIS — Z23 Encounter for immunization: Secondary | ICD-10-CM | POA: Diagnosis not present

## 2013-05-25 DIAGNOSIS — I5022 Chronic systolic (congestive) heart failure: Secondary | ICD-10-CM | POA: Diagnosis not present

## 2013-05-25 DIAGNOSIS — R413 Other amnesia: Secondary | ICD-10-CM | POA: Diagnosis not present

## 2013-05-25 DIAGNOSIS — E782 Mixed hyperlipidemia: Secondary | ICD-10-CM | POA: Diagnosis not present

## 2013-05-25 DIAGNOSIS — I10 Essential (primary) hypertension: Secondary | ICD-10-CM | POA: Diagnosis not present

## 2013-05-26 ENCOUNTER — Encounter: Payer: Medicare Other | Admitting: Internal Medicine

## 2013-05-27 ENCOUNTER — Encounter (HOSPITAL_COMMUNITY)
Admission: RE | Admit: 2013-05-27 | Discharge: 2013-05-27 | Disposition: A | Discharge status: Home / Self Care | Payer: Self-pay | Source: Ambulatory Visit | Attending: Internal Medicine | Admitting: Internal Medicine

## 2013-06-01 ENCOUNTER — Encounter (HOSPITAL_COMMUNITY)
Admission: RE | Admit: 2013-06-01 | Discharge: 2013-06-01 | Disposition: A | Discharge status: Home / Self Care | Payer: Self-pay | Source: Ambulatory Visit | Attending: Internal Medicine | Admitting: Internal Medicine

## 2013-06-03 ENCOUNTER — Encounter (HOSPITAL_COMMUNITY)
Admission: RE | Admit: 2013-06-03 | Discharge: 2013-06-03 | Disposition: A | Discharge status: Home / Self Care | Payer: Self-pay | Source: Ambulatory Visit | Attending: Internal Medicine | Admitting: Internal Medicine

## 2013-06-06 ENCOUNTER — Encounter (HOSPITAL_COMMUNITY)
Admission: RE | Admit: 2013-06-06 | Discharge: 2013-06-06 | Disposition: A | Discharge status: Home / Self Care | Payer: Self-pay | Source: Ambulatory Visit | Attending: Internal Medicine | Admitting: Internal Medicine

## 2013-06-06 DIAGNOSIS — Z5189 Encounter for other specified aftercare: Secondary | ICD-10-CM | POA: Insufficient documentation

## 2013-06-06 DIAGNOSIS — J9610 Chronic respiratory failure, unspecified whether with hypoxia or hypercapnia: Secondary | ICD-10-CM | POA: Insufficient documentation

## 2013-06-06 DIAGNOSIS — Z95810 Presence of automatic (implantable) cardiac defibrillator: Secondary | ICD-10-CM | POA: Insufficient documentation

## 2013-06-06 DIAGNOSIS — Z954 Presence of other heart-valve replacement: Secondary | ICD-10-CM | POA: Insufficient documentation

## 2013-06-06 DIAGNOSIS — I69959 Hemiplegia and hemiparesis following unspecified cerebrovascular disease affecting unspecified side: Secondary | ICD-10-CM | POA: Insufficient documentation

## 2013-06-08 ENCOUNTER — Encounter (HOSPITAL_COMMUNITY)
Admission: RE | Admit: 2013-06-08 | Discharge: 2013-06-08 | Disposition: A | Discharge status: Home / Self Care | Payer: Self-pay | Source: Ambulatory Visit | Attending: Internal Medicine | Admitting: Internal Medicine

## 2013-06-10 ENCOUNTER — Ambulatory Visit (INDEPENDENT_AMBULATORY_CARE_PROVIDER_SITE_OTHER): Payer: Medicare Other | Admitting: *Deleted

## 2013-06-10 ENCOUNTER — Encounter (HOSPITAL_COMMUNITY): Payer: Self-pay

## 2013-06-10 DIAGNOSIS — I4891 Unspecified atrial fibrillation: Secondary | ICD-10-CM

## 2013-06-10 DIAGNOSIS — Z5181 Encounter for therapeutic drug level monitoring: Secondary | ICD-10-CM

## 2013-06-10 DIAGNOSIS — Z95810 Presence of automatic (implantable) cardiac defibrillator: Secondary | ICD-10-CM | POA: Diagnosis not present

## 2013-06-10 DIAGNOSIS — Z7901 Long term (current) use of anticoagulants: Secondary | ICD-10-CM

## 2013-06-10 LAB — POCT INR: INR: 3.2

## 2013-06-13 ENCOUNTER — Encounter (HOSPITAL_COMMUNITY): Payer: Self-pay

## 2013-06-13 DIAGNOSIS — M25579 Pain in unspecified ankle and joints of unspecified foot: Secondary | ICD-10-CM | POA: Diagnosis not present

## 2013-06-13 DIAGNOSIS — M19079 Primary osteoarthritis, unspecified ankle and foot: Secondary | ICD-10-CM | POA: Diagnosis not present

## 2013-06-14 ENCOUNTER — Other Ambulatory Visit (HOSPITAL_COMMUNITY): Payer: Self-pay | Admitting: Internal Medicine

## 2013-06-15 ENCOUNTER — Encounter (HOSPITAL_COMMUNITY)
Admission: RE | Admit: 2013-06-15 | Discharge: 2013-06-15 | Disposition: A | Discharge status: Home / Self Care | Payer: Self-pay | Source: Ambulatory Visit | Attending: Internal Medicine | Admitting: Internal Medicine

## 2013-06-17 ENCOUNTER — Encounter (HOSPITAL_COMMUNITY): Payer: Self-pay

## 2013-06-20 ENCOUNTER — Encounter (HOSPITAL_COMMUNITY)
Admission: RE | Admit: 2013-06-20 | Discharge: 2013-06-20 | Disposition: A | Discharge status: Home / Self Care | Payer: Self-pay | Source: Ambulatory Visit | Attending: Internal Medicine | Admitting: Internal Medicine

## 2013-06-22 ENCOUNTER — Encounter (HOSPITAL_COMMUNITY): Payer: Self-pay

## 2013-06-24 ENCOUNTER — Encounter (HOSPITAL_COMMUNITY)
Admission: RE | Admit: 2013-06-24 | Discharge: 2013-06-24 | Disposition: A | Discharge status: Home / Self Care | Payer: Self-pay | Source: Ambulatory Visit | Attending: Internal Medicine | Admitting: Internal Medicine

## 2013-06-27 ENCOUNTER — Encounter (HOSPITAL_COMMUNITY)
Admission: RE | Admit: 2013-06-27 | Discharge: 2013-06-27 | Disposition: A | Discharge status: Home / Self Care | Payer: Self-pay | Source: Ambulatory Visit | Attending: Internal Medicine | Admitting: Internal Medicine

## 2013-06-29 ENCOUNTER — Encounter (HOSPITAL_COMMUNITY)
Admission: RE | Admit: 2013-06-29 | Discharge: 2013-06-29 | Disposition: A | Discharge status: Home / Self Care | Payer: Self-pay | Source: Ambulatory Visit | Attending: Internal Medicine | Admitting: Internal Medicine

## 2013-07-01 ENCOUNTER — Ambulatory Visit (INDEPENDENT_AMBULATORY_CARE_PROVIDER_SITE_OTHER): Payer: Medicare Other

## 2013-07-01 ENCOUNTER — Telehealth: Payer: Self-pay

## 2013-07-01 ENCOUNTER — Encounter (HOSPITAL_COMMUNITY): Payer: Self-pay

## 2013-07-01 DIAGNOSIS — I4891 Unspecified atrial fibrillation: Secondary | ICD-10-CM | POA: Diagnosis not present

## 2013-07-01 DIAGNOSIS — Z95810 Presence of automatic (implantable) cardiac defibrillator: Secondary | ICD-10-CM | POA: Diagnosis not present

## 2013-07-01 DIAGNOSIS — Z7901 Long term (current) use of anticoagulants: Secondary | ICD-10-CM | POA: Diagnosis not present

## 2013-07-01 DIAGNOSIS — Z5181 Encounter for therapeutic drug level monitoring: Secondary | ICD-10-CM

## 2013-07-01 LAB — POCT INR: INR: 2.2

## 2013-07-01 NOTE — Telephone Encounter (Signed)
Pt requesting cardiac med be refilled with 90 day supplies to [facility], [place].  Thanks

## 2013-07-04 ENCOUNTER — Encounter (HOSPITAL_COMMUNITY): Payer: Self-pay

## 2013-07-04 NOTE — Telephone Encounter (Signed)
Which cardiac med?

## 2013-07-05 NOTE — Telephone Encounter (Signed)
Spoke with wife and she states this issue has been resolved

## 2013-07-06 ENCOUNTER — Encounter (HOSPITAL_COMMUNITY): Payer: Medicare Other | Attending: Internal Medicine

## 2013-07-06 DIAGNOSIS — J9610 Chronic respiratory failure, unspecified whether with hypoxia or hypercapnia: Secondary | ICD-10-CM | POA: Insufficient documentation

## 2013-07-06 DIAGNOSIS — Z95810 Presence of automatic (implantable) cardiac defibrillator: Secondary | ICD-10-CM | POA: Insufficient documentation

## 2013-07-06 DIAGNOSIS — Z5189 Encounter for other specified aftercare: Secondary | ICD-10-CM | POA: Insufficient documentation

## 2013-07-06 DIAGNOSIS — I69959 Hemiplegia and hemiparesis following unspecified cerebrovascular disease affecting unspecified side: Secondary | ICD-10-CM | POA: Insufficient documentation

## 2013-07-06 DIAGNOSIS — Z954 Presence of other heart-valve replacement: Secondary | ICD-10-CM | POA: Insufficient documentation

## 2013-07-11 ENCOUNTER — Encounter (HOSPITAL_COMMUNITY): Payer: Self-pay

## 2013-07-13 ENCOUNTER — Encounter (HOSPITAL_COMMUNITY): Payer: Self-pay

## 2013-07-15 ENCOUNTER — Encounter (HOSPITAL_COMMUNITY): Payer: Self-pay

## 2013-07-18 ENCOUNTER — Encounter (HOSPITAL_COMMUNITY): Payer: Self-pay

## 2013-07-18 ENCOUNTER — Telehealth: Payer: Self-pay | Admitting: *Deleted

## 2013-07-18 NOTE — Telephone Encounter (Signed)
Pt's wife states she thought pt was taking coumadin 5mg  had her to read from bottle and she states it is Coumadin 2mg . She states he is bruising more and she is concerned so changed his appt so that he will be seen tomorrow to have his INR checked and she states understanding

## 2013-07-19 ENCOUNTER — Ambulatory Visit (INDEPENDENT_AMBULATORY_CARE_PROVIDER_SITE_OTHER): Payer: Medicare Other

## 2013-07-19 DIAGNOSIS — Z7901 Long term (current) use of anticoagulants: Secondary | ICD-10-CM | POA: Diagnosis not present

## 2013-07-19 DIAGNOSIS — I4891 Unspecified atrial fibrillation: Secondary | ICD-10-CM | POA: Diagnosis not present

## 2013-07-19 DIAGNOSIS — Z5181 Encounter for therapeutic drug level monitoring: Secondary | ICD-10-CM

## 2013-07-19 DIAGNOSIS — Z95810 Presence of automatic (implantable) cardiac defibrillator: Secondary | ICD-10-CM

## 2013-07-19 LAB — POCT INR: INR: 1.9

## 2013-07-20 ENCOUNTER — Encounter (HOSPITAL_COMMUNITY): Payer: Self-pay

## 2013-07-20 ENCOUNTER — Telehealth: Payer: Self-pay | Admitting: *Deleted

## 2013-07-20 MED ORDER — WARFARIN SODIUM 2 MG PO TABS: ORAL_TABLET | ORAL | Status: DC

## 2013-07-20 NOTE — Telephone Encounter (Signed)
Refill done as requested Sent to [facility]

## 2013-07-20 NOTE — Telephone Encounter (Signed)
Patients needs coumadin refill [facility]. Thanks, MI

## 2013-07-21 ENCOUNTER — Telehealth: Payer: Self-pay

## 2013-07-21 ENCOUNTER — Other Ambulatory Visit (HOSPITAL_COMMUNITY): Payer: Self-pay | Admitting: Internal Medicine

## 2013-07-21 DIAGNOSIS — M25579 Pain in unspecified ankle and joints of unspecified foot: Secondary | ICD-10-CM | POA: Diagnosis not present

## 2013-07-21 DIAGNOSIS — M129 Arthropathy, unspecified: Secondary | ICD-10-CM | POA: Diagnosis not present

## 2013-07-21 DIAGNOSIS — M19079 Primary osteoarthritis, unspecified ankle and foot: Secondary | ICD-10-CM | POA: Diagnosis not present

## 2013-07-21 NOTE — Telephone Encounter (Signed)
Called and spoke with Helene Kelp at [facility] and she states they did refill of Warfarin 2mg  yesterday . She states that they did use [place] when obtained refill in April 2015 but they no longer carry [place] . This nurse called and spoke with pt and gave above information and instructed that [place] and Warfarin are the same and the refill has been done and is waiting for him to pick up at the pharmacy and if has any more questions to talk with Helene Kelp at [facility] and he states understanding.

## 2013-07-22 ENCOUNTER — Encounter (HOSPITAL_COMMUNITY): Payer: Self-pay

## 2013-07-24 ENCOUNTER — Other Ambulatory Visit: Payer: Self-pay

## 2013-07-24 MED ORDER — DONEPEZIL HCL 10 MG PO TABS: 10.0000 mg | ORAL_TABLET | Freq: Every day | ORAL | Status: DC

## 2013-07-25 ENCOUNTER — Encounter (HOSPITAL_COMMUNITY): Payer: Self-pay

## 2013-07-27 ENCOUNTER — Encounter (HOSPITAL_COMMUNITY): Payer: Self-pay

## 2013-07-29 ENCOUNTER — Ambulatory Visit: Payer: Medicare Other | Admitting: Neurology

## 2013-07-29 ENCOUNTER — Encounter (HOSPITAL_COMMUNITY): Payer: Self-pay

## 2013-08-01 ENCOUNTER — Encounter (HOSPITAL_COMMUNITY): Payer: Self-pay

## 2013-08-03 ENCOUNTER — Encounter (HOSPITAL_COMMUNITY): Payer: Self-pay

## 2013-08-05 ENCOUNTER — Encounter (HOSPITAL_COMMUNITY): Payer: Self-pay

## 2013-08-08 ENCOUNTER — Encounter (HOSPITAL_COMMUNITY): Payer: Medicare Other | Attending: Internal Medicine

## 2013-08-08 DIAGNOSIS — J9610 Chronic respiratory failure, unspecified whether with hypoxia or hypercapnia: Secondary | ICD-10-CM | POA: Insufficient documentation

## 2013-08-08 DIAGNOSIS — Z95810 Presence of automatic (implantable) cardiac defibrillator: Secondary | ICD-10-CM | POA: Insufficient documentation

## 2013-08-08 DIAGNOSIS — Z5189 Encounter for other specified aftercare: Secondary | ICD-10-CM | POA: Insufficient documentation

## 2013-08-08 DIAGNOSIS — I69959 Hemiplegia and hemiparesis following unspecified cerebrovascular disease affecting unspecified side: Secondary | ICD-10-CM | POA: Insufficient documentation

## 2013-08-08 DIAGNOSIS — Z954 Presence of other heart-valve replacement: Secondary | ICD-10-CM | POA: Insufficient documentation

## 2013-08-10 ENCOUNTER — Encounter (HOSPITAL_COMMUNITY): Payer: Self-pay

## 2013-08-12 ENCOUNTER — Encounter (HOSPITAL_COMMUNITY): Payer: Self-pay

## 2013-08-15 ENCOUNTER — Encounter (HOSPITAL_COMMUNITY): Payer: Self-pay

## 2013-08-16 ENCOUNTER — Ambulatory Visit (INDEPENDENT_AMBULATORY_CARE_PROVIDER_SITE_OTHER): Payer: Medicare Other | Admitting: Pharmacist

## 2013-08-16 DIAGNOSIS — Z7901 Long term (current) use of anticoagulants: Secondary | ICD-10-CM | POA: Diagnosis not present

## 2013-08-16 DIAGNOSIS — Z5181 Encounter for therapeutic drug level monitoring: Secondary | ICD-10-CM | POA: Diagnosis not present

## 2013-08-16 DIAGNOSIS — Z95810 Presence of automatic (implantable) cardiac defibrillator: Secondary | ICD-10-CM

## 2013-08-16 DIAGNOSIS — I4891 Unspecified atrial fibrillation: Secondary | ICD-10-CM | POA: Diagnosis not present

## 2013-08-16 LAB — POCT INR: INR: 2.8

## 2013-08-17 ENCOUNTER — Encounter (HOSPITAL_COMMUNITY): Payer: Self-pay

## 2013-08-19 ENCOUNTER — Encounter (HOSPITAL_COMMUNITY): Payer: Self-pay

## 2013-08-22 ENCOUNTER — Encounter (HOSPITAL_COMMUNITY): Payer: Self-pay

## 2013-08-24 ENCOUNTER — Encounter (HOSPITAL_COMMUNITY): Payer: Self-pay

## 2013-08-25 ENCOUNTER — Encounter: Payer: Medicare Other | Admitting: *Deleted

## 2013-08-25 ENCOUNTER — Telehealth: Payer: Self-pay | Admitting: Cardiology

## 2013-08-25 NOTE — Telephone Encounter (Signed)
Confirmed remote transmission with pt wife.

## 2013-08-26 ENCOUNTER — Encounter (HOSPITAL_COMMUNITY): Payer: Self-pay

## 2013-08-26 ENCOUNTER — Encounter: Payer: Self-pay | Admitting: Cardiology

## 2013-08-29 ENCOUNTER — Encounter (HOSPITAL_COMMUNITY): Payer: Self-pay

## 2013-08-30 ENCOUNTER — Telehealth (HOSPITAL_COMMUNITY): Payer: Self-pay | Admitting: *Deleted

## 2013-08-31 ENCOUNTER — Encounter (HOSPITAL_COMMUNITY): Payer: Self-pay

## 2013-08-31 DIAGNOSIS — I10 Essential (primary) hypertension: Secondary | ICD-10-CM | POA: Diagnosis not present

## 2013-08-31 DIAGNOSIS — Z954 Presence of other heart-valve replacement: Secondary | ICD-10-CM | POA: Diagnosis not present

## 2013-08-31 DIAGNOSIS — E782 Mixed hyperlipidemia: Secondary | ICD-10-CM | POA: Diagnosis not present

## 2013-08-31 DIAGNOSIS — Z95810 Presence of automatic (implantable) cardiac defibrillator: Secondary | ICD-10-CM | POA: Diagnosis not present

## 2013-08-31 DIAGNOSIS — Z1331 Encounter for screening for depression: Secondary | ICD-10-CM | POA: Diagnosis not present

## 2013-08-31 DIAGNOSIS — R7309 Other abnormal glucose: Secondary | ICD-10-CM | POA: Diagnosis not present

## 2013-08-31 DIAGNOSIS — Z0000 Encounter for general adult medical examination without abnormal findings: Secondary | ICD-10-CM | POA: Diagnosis not present

## 2013-08-31 DIAGNOSIS — R413 Other amnesia: Secondary | ICD-10-CM | POA: Diagnosis not present

## 2013-08-31 DIAGNOSIS — M109 Gout, unspecified: Secondary | ICD-10-CM | POA: Diagnosis not present

## 2013-08-31 DIAGNOSIS — I5022 Chronic systolic (congestive) heart failure: Secondary | ICD-10-CM | POA: Diagnosis not present

## 2013-09-01 ENCOUNTER — Telehealth: Payer: Self-pay | Admitting: Internal Medicine

## 2013-09-01 NOTE — Telephone Encounter (Signed)
New message      Did you get pt's remote transmission?

## 2013-09-02 ENCOUNTER — Ambulatory Visit (INDEPENDENT_AMBULATORY_CARE_PROVIDER_SITE_OTHER): Payer: Medicare Other | Admitting: *Deleted

## 2013-09-02 ENCOUNTER — Encounter (HOSPITAL_COMMUNITY): Payer: Self-pay

## 2013-09-02 DIAGNOSIS — I428 Other cardiomyopathies: Secondary | ICD-10-CM

## 2013-09-02 LAB — MDC_IDC_ENUM_SESS_TYPE_REMOTE
Battery Remaining Longevity: 89 mo
Battery Voltage: 3 V
Brady Statistic AS VP Percent: 32.98 %
Brady Statistic AS VS Percent: 0.52 %
Date Time Interrogation Session: 20150829141224
HIGH POWER IMPEDANCE MEASURED VALUE: 228 Ohm
HIGH POWER IMPEDANCE MEASURED VALUE: 45 Ohm
HighPow Impedance: 56 Ohm
Lead Channel Impedance Value: 456 Ohm
Lead Channel Impedance Value: 475 Ohm
Lead Channel Impedance Value: 513 Ohm
Lead Channel Impedance Value: 703 Ohm
Lead Channel Pacing Threshold Amplitude: 0.625 V
Lead Channel Pacing Threshold Amplitude: 0.875 V
Lead Channel Pacing Threshold Pulse Width: 0.4 ms
Lead Channel Pacing Threshold Pulse Width: 0.4 ms
Lead Channel Sensing Intrinsic Amplitude: 0.5 mV
Lead Channel Sensing Intrinsic Amplitude: 17 mV
Lead Channel Setting Pacing Amplitude: 2 V
Lead Channel Setting Pacing Amplitude: 2.5 V
Lead Channel Setting Pacing Pulse Width: 0.4 ms
Lead Channel Setting Sensing Sensitivity: 0.6 mV
MDC IDC MSMT LEADCHNL LV IMPEDANCE VALUE: 361 Ohm
MDC IDC MSMT LEADCHNL LV PACING THRESHOLD PULSEWIDTH: 0.4 ms
MDC IDC MSMT LEADCHNL RA PACING THRESHOLD AMPLITUDE: 1 V
MDC IDC MSMT LEADCHNL RA SENSING INTR AMPL: 0.5 mV
MDC IDC MSMT LEADCHNL RV SENSING INTR AMPL: 17 mV
MDC IDC SET LEADCHNL RA PACING AMPLITUDE: 2 V
MDC IDC SET LEADCHNL RV PACING PULSEWIDTH: 0.4 ms
MDC IDC SET ZONE DETECTION INTERVAL: 360 ms
MDC IDC SET ZONE DETECTION INTERVAL: 400 ms
MDC IDC STAT BRADY AP VP PERCENT: 66.19 %
MDC IDC STAT BRADY AP VS PERCENT: 0.3 %
MDC IDC STAT BRADY RA PERCENT PACED: 66.49 %
MDC IDC STAT BRADY RV PERCENT PACED: 97.3 %
Zone Setting Detection Interval: 320 ms
Zone Setting Detection Interval: 430 ms

## 2013-09-02 NOTE — Telephone Encounter (Signed)
Remote not received. Pt's son previously worked for [employer]. They will attempt to send another transmission this weekend.

## 2013-09-03 ENCOUNTER — Encounter: Payer: Self-pay | Admitting: Internal Medicine

## 2013-09-05 ENCOUNTER — Encounter: Payer: Self-pay | Admitting: Internal Medicine

## 2013-09-05 ENCOUNTER — Encounter (HOSPITAL_COMMUNITY): Payer: Self-pay

## 2013-09-07 ENCOUNTER — Other Ambulatory Visit: Payer: Self-pay | Admitting: *Deleted

## 2013-09-07 ENCOUNTER — Telehealth: Payer: Self-pay | Admitting: *Deleted

## 2013-09-07 NOTE — Telephone Encounter (Signed)
Spoke to wife regarding recent monitored VT episodes. Dates/times of episodes given to wife. She states that he has not had any symptoms of CP, SOB, syncope, or edema. I informed GT of episode, plan to continue F/U as planned since pt was w/o sxms. Informed wife of GT's plan. Wife voiced understanding. I encouraged her to call if pt develops symptoms.

## 2013-09-07 NOTE — Progress Notes (Signed)
Remote ICD transmission.   

## 2013-09-08 ENCOUNTER — Telehealth: Payer: Self-pay | Admitting: Internal Medicine

## 2013-09-08 ENCOUNTER — Other Ambulatory Visit: Payer: Self-pay | Admitting: *Deleted

## 2013-09-08 DIAGNOSIS — I5022 Chronic systolic (congestive) heart failure: Secondary | ICD-10-CM

## 2013-09-08 MED ORDER — CARVEDILOL 3.125 MG PO TABS: 6.2500 mg | ORAL_TABLET | Freq: Two times a day (BID) | ORAL | Status: DC

## 2013-09-08 NOTE — Telephone Encounter (Signed)
New message      Need time he had "episodes" days his device picked up something

## 2013-09-08 NOTE — Telephone Encounter (Signed)
Pt wife wanted to know what time of the patient had episodes on 09-01-2013. I informed pt wife that pt had episodes at 6:16 PM, 6:54 PM, 7:00PM, and 8:00PM. She verbalized understanding.

## 2013-09-13 ENCOUNTER — Ambulatory Visit (INDEPENDENT_AMBULATORY_CARE_PROVIDER_SITE_OTHER): Payer: Medicare Other | Admitting: *Deleted

## 2013-09-13 DIAGNOSIS — Z7901 Long term (current) use of anticoagulants: Secondary | ICD-10-CM

## 2013-09-13 DIAGNOSIS — Z5181 Encounter for therapeutic drug level monitoring: Secondary | ICD-10-CM | POA: Diagnosis not present

## 2013-09-13 DIAGNOSIS — Z95810 Presence of automatic (implantable) cardiac defibrillator: Secondary | ICD-10-CM

## 2013-09-13 DIAGNOSIS — I4891 Unspecified atrial fibrillation: Secondary | ICD-10-CM | POA: Diagnosis not present

## 2013-09-13 LAB — POCT INR: INR: 2.5

## 2013-09-15 ENCOUNTER — Ambulatory Visit (INDEPENDENT_AMBULATORY_CARE_PROVIDER_SITE_OTHER): Payer: Medicare Other | Admitting: Neurology

## 2013-09-15 ENCOUNTER — Encounter: Payer: Self-pay | Admitting: Cardiology

## 2013-09-15 ENCOUNTER — Encounter (INDEPENDENT_AMBULATORY_CARE_PROVIDER_SITE_OTHER): Payer: Self-pay

## 2013-09-15 ENCOUNTER — Encounter: Payer: Self-pay | Admitting: Neurology

## 2013-09-15 VITALS — BP 95/64 | HR 84 | Ht 70.0 in | Wt 196.0 lb

## 2013-09-15 DIAGNOSIS — F09 Unspecified mental disorder due to known physiological condition: Secondary | ICD-10-CM

## 2013-09-15 DIAGNOSIS — R4189 Other symptoms and signs involving cognitive functions and awareness: Secondary | ICD-10-CM

## 2013-09-15 DIAGNOSIS — R269 Unspecified abnormalities of gait and mobility: Secondary | ICD-10-CM

## 2013-09-15 DIAGNOSIS — R2681 Unsteadiness on feet: Secondary | ICD-10-CM

## 2013-09-15 MED ORDER — DONEPEZIL HCL 23 MG PO TABS: 23.0000 mg | ORAL_TABLET | Freq: Every day | ORAL | Status: DC

## 2013-09-15 NOTE — Progress Notes (Signed)
[facility]    Provider:  Dr Janann Colonel Referring Provider: [name], MD Primary Care Physician:  [name], MD  CC:  Memory decline  HPI:  Jon Butler is a 78 y.o. male here as a referral from Dr. Lysle Rubens for memory decline. Last visit was 03/2013 at which time his Aricept was increased to 10mg  nightly and he was continued on Namenda 28mg  ER. Since last visit patient feels he is stable overall, wife feels slight worsening in short term memory. He has had 2 to 3 falls since last visit. It appears to be a mechanical fall as wife notes he catches his left toes when walking. Wife notes he has difficulty raising that foot, reports having a chronic ankle injury for which he is followed by ortho surgery.      Initial visit 03/2013: Patient denies any concerns. Wife believes problem started around 1.5 years ago. Mainly a problem with short term memory, forgetting day to day things. They feel it is getting progressively worse with a noted drastic decline after event in October. Trouble recalling why he went into a room, forgets talking to people. Wife notes good remote memory.  Takes trazodone to help with his sleep, does not sleep well without it. No hallucinations. Started Namenda in August 2014, Aricept started this month. States he drinks "a few glasses of wine" a day.  He is not currently driving, stopped in October due to LOC from heart failure, he was intubated for respiratory failure at this time and spent 15 days in the hospital. Has artificial aortic valve replacement and ICD in place, currently taking coumadin.   Review of Systems: Out of a complete 14 system review, the patient complains of only the following symptoms, and all other reviewed systems are negative. + easy bruising, easy bleeding  History   Social History  . Marital Status: Married    Spouse Name: [name]    Number of Children: 5  . Years of Education: 12   Occupational History  . retired      Social History Main Topics  . Smoking status: Never Smoker   . Smokeless tobacco: Never Used  . Alcohol Use: 0.0 oz/week    2-3 Glasses of wine per week     Comment: 2-3 glasses  . Drug Use: No  . Sexual Activity: Not on file   Other Topics Concern  . Not on file   Social History Narrative   Patient is married [name]).   Patient is retired.   5 children    12th grade   2 cups caffeine daily    Family History  Problem Relation Age of Onset  . Aneurysm Mother     brain  . Heart attack Father 72    Past Medical History  Diagnosis Date  . Left bundle-branch block   . Cardiomyopathy   . Chronic systolic heart failure     Past Surgical History  Procedure Laterality Date  . Doppler echocardiography  2008, 2009  . Aortic valve replacement    . Tee without cardioversion N/A 10/15/2012    Procedure: TRANSESOPHAGEAL ECHOCARDIOGRAM (TEE);  Surgeon: Fay Records, MD;  Location: [facility] ENDOSCOPY;  Service: Cardiovascular;  Laterality: N/A;  . Cardioversion N/A 10/25/2012    Procedure: CARDIOVERSION;  Surgeon: Evans Lance, MD;  Location: [place];  Service: Cardiovascular;  Laterality: N/A;  . Cardioversion N/A 12/20/2012    Procedure: CARDIOVERSION;  Surgeon: Larey Dresser, MD;  Location: [place];  Service: Cardiovascular;  Laterality: N/A;    Current Outpatient Prescriptions  Medication Sig Dispense Refill  . allopurinol (ZYLOPRIM) 300 MG tablet Take 200 mg by mouth daily.       Marland Kitchen amiodarone (PACERONE) 400 MG tablet Take 0.5 tablets (200 mg total) by mouth 2 (two) times daily.  30 tablet  4  . amoxicillin (AMOXIL) 500 MG capsule Take 500 mg by mouth every other day.       Marland Kitchen aspirin EC 81 MG tablet Take 1 tablet (81 mg total) by mouth daily.      . carvedilol (COREG) 3.125 MG tablet Take 2 tablets (6.25 mg total) by mouth 2 (two) times daily with a meal.  120 tablet  1  . Coenzyme Q10 (CO Q-10 PO) Take 1 tablet by mouth daily.       . colchicine 0.6 MG tablet Take 0.6  mg by mouth daily as needed (gout pain).       Marland Kitchen donepezil (ARICEPT) 10 MG tablet Take 1 tablet (10 mg total) by mouth at bedtime.  30 tablet  0  . fluticasone (FLONASE) 50 MCG/ACT nasal spray       . furosemide (LASIX) 40 MG tablet TAKE 1 TABLET BY MOUTH DAILY TAKE AN EXTRA DOSE OF 40MG  IF WEIGHT IS >209LBS  30 tablet  3  . lisinopril (PRINIVIL,ZESTRIL) 10 MG tablet Take 0.5 tablets (5 mg total) by mouth at bedtime.  30 tablet  4  . Memantine HCl ER (NAMENDA XR) 28 MG CP24 Take 28 mg by mouth daily.      . Multiple Vitamins-Minerals (MULTIVITAMIN WITH MINERALS) tablet Take 1 tablet by mouth daily.       . rosuvastatin (CRESTOR) 10 MG tablet Take 10 mg by mouth daily.        Marland Kitchen spironolactone (ALDACTONE) 25 MG tablet Take 0.5 tablets (12.5 mg total) by mouth daily.  30 tablet  3  . thiamine (VITAMIN B-1) 100 MG tablet Take 1 tablet (100 mg total) by mouth 2 (two) times daily.  60 tablet  6  . traZODone (DESYREL) 50 MG tablet Take 150 mg by mouth at bedtime.       Marland Kitchen warfarin (COUMADIN) 2 MG tablet Take as directed by coumadin clinic  90 tablet  1   No current facility-administered medications for this visit.    Allergies as of 09/15/2013  . (No Known Allergies)    Vitals: BP 95/64  Pulse 84  Ht 5\' 10"  (1.778 m)  Wt 196 lb (88.905 kg)  BMI 28.12 kg/m2 Last Weight:  Wt Readings from Last 1 Encounters:  09/15/13 196 lb (88.905 kg)   Last Height:   Ht Readings from Last 1 Encounters:  09/15/13 5\' 10"  (1.778 m)     Physical exam: Exam: Gen: NAD, conversant Eyes: anicteric sclerae, moist conjunctivae HENT: Atraumatic, oropharynx clear Neck: Trachea midline; supple,  Lungs: CTA, no wheezing, rales, rhonic                          CV: RRR, no MRG Abdomen: Soft, non-tender;  Extremities: No peripheral edema  Skin: Normal temperature, no rash,  Psych: Appropriate affect, pleasant  Neuro: MS:  MOCA 19/30 at prior visit  CN: PERRL, EOMI no nystagmus, no ptosis, sensation  intact to LT V1-V3 bilat, face symmetric, no weakness, hearing grossly intact, palate elevates symmetrically, shoulder shrug 5/5 bilat,  tongue protrudes midline, no fasiculations noted.  Motor: normal bulk and tone Strength: 5/5  In  all extremities  Coord: rapid alternating and point-to-point (FNF, HTS) movements intact.  Reflexes: brisk but symmetrical, bilat downgoing toes  Sens: LT intact in all extremities  Gait: posture, stance, stride and arm-swing normal. Tandem gait intact. Able to walk on heels and toes. Romberg absent.   Assessment:  After physical and neurologic examination, review of laboratory studies, imaging, neurophysiology testing and pre-existing records, assessment will be reviewed on the problem list.  Plan:    1)Cogntivie decline 2)Gait instability  77y/o gentleman presenting for follow up evaluation of cognitive decline. Wife reports he is overall stable since last visit with a slight worsening of short term memory. Of note he has had 2-3 falls since last visit. Per description they appear related to impaired dorsiflexion of left ankle related to a chronic injury. Will increase Aricept to 23mg  nightly and continue Namenda 28mg  XR. Will place referral to PT for gait training. Follow up in 6 months.    Jim Like, DO  [facility] [address] [place], [place] [address]  Phone [phone] Fax [phone]

## 2013-09-15 NOTE — Patient Instructions (Signed)
Overall you are doing fairly well but I do want to suggest a few things today:   Remember to drink plenty of fluid, eat healthy meals and do not skip any meals. Try to eat protein with a every meal and eat a healthy snack such as fruit or nuts in between meals. Try to keep a regular sleep-wake schedule and try to exercise daily, particularly in the form of walking, 20-30 minutes a day, if you can.   As far as your medications are concerned, I would like to suggest we increase your Aricept to 23mg  nightly. Please continue on Namenda 28mg  daily  I would like you to work with physical therapy, a referral has been placed and you will be called to schedule this  Please follow up in 6 months with Dr Leta Baptist.  Please call us with any interim questions, concerns, problems, updates or refill requests.   My clinical assistant and will answer any of your questions and relay your messages to me and also relay most of my messages to you.   Our phone number is [phone]. We also have an after hours call service for urgent matters and there is a physician on-call for urgent questions. For any emergencies you know to call 911 or go to the nearest emergency room

## 2013-09-19 ENCOUNTER — Other Ambulatory Visit: Payer: Self-pay

## 2013-09-19 MED ORDER — DONEPEZIL HCL 23 MG PO TABS: 23.0000 mg | ORAL_TABLET | Freq: Every day | ORAL | Status: DC

## 2013-10-03 DIAGNOSIS — R7989 Other specified abnormal findings of blood chemistry: Secondary | ICD-10-CM | POA: Diagnosis not present

## 2013-10-05 DIAGNOSIS — M19079 Primary osteoarthritis, unspecified ankle and foot: Secondary | ICD-10-CM | POA: Diagnosis not present

## 2013-10-05 DIAGNOSIS — M25579 Pain in unspecified ankle and joints of unspecified foot: Secondary | ICD-10-CM | POA: Diagnosis not present

## 2013-10-06 ENCOUNTER — Telehealth: Payer: Self-pay | Admitting: Diagnostic Neuroimaging

## 2013-10-06 NOTE — Telephone Encounter (Signed)
Kennyth Lose from [facility] called to say that Dr Janann Colonel referred pt for gait instability to have  PT Kennyth Lose has spoken to pt wife and they do not want PT at this time because pt got an injection and they are traveling they will get a referral at a later date if they so choose. Pt has been transferred to Dr Leta Baptist.  Any questions call Kennyth Lose at [phone] Thanks Diane

## 2013-10-07 NOTE — Telephone Encounter (Signed)
Noted  

## 2013-10-18 ENCOUNTER — Ambulatory Visit (INDEPENDENT_AMBULATORY_CARE_PROVIDER_SITE_OTHER): Payer: Medicare Other | Admitting: *Deleted

## 2013-10-18 DIAGNOSIS — Z95810 Presence of automatic (implantable) cardiac defibrillator: Secondary | ICD-10-CM | POA: Diagnosis not present

## 2013-10-18 DIAGNOSIS — I4891 Unspecified atrial fibrillation: Secondary | ICD-10-CM | POA: Diagnosis not present

## 2013-10-18 DIAGNOSIS — Z5181 Encounter for therapeutic drug level monitoring: Secondary | ICD-10-CM

## 2013-10-18 DIAGNOSIS — Z7901 Long term (current) use of anticoagulants: Secondary | ICD-10-CM

## 2013-10-18 DIAGNOSIS — Z23 Encounter for immunization: Secondary | ICD-10-CM | POA: Diagnosis not present

## 2013-10-18 LAB — POCT INR: INR: 1.9

## 2013-10-26 DIAGNOSIS — M25572 Pain in left ankle and joints of left foot: Secondary | ICD-10-CM | POA: Diagnosis not present

## 2013-10-26 DIAGNOSIS — R269 Unspecified abnormalities of gait and mobility: Secondary | ICD-10-CM | POA: Diagnosis not present

## 2013-10-26 DIAGNOSIS — M5417 Radiculopathy, lumbosacral region: Secondary | ICD-10-CM | POA: Diagnosis not present

## 2013-10-26 DIAGNOSIS — M19072 Primary osteoarthritis, left ankle and foot: Secondary | ICD-10-CM | POA: Diagnosis not present

## 2013-10-26 DIAGNOSIS — M9903 Segmental and somatic dysfunction of lumbar region: Secondary | ICD-10-CM | POA: Diagnosis not present

## 2013-10-26 DIAGNOSIS — M9902 Segmental and somatic dysfunction of thoracic region: Secondary | ICD-10-CM | POA: Diagnosis not present

## 2013-10-26 DIAGNOSIS — M5414 Radiculopathy, thoracic region: Secondary | ICD-10-CM | POA: Diagnosis not present

## 2013-10-26 DIAGNOSIS — M9904 Segmental and somatic dysfunction of sacral region: Secondary | ICD-10-CM | POA: Diagnosis not present

## 2013-10-26 DIAGNOSIS — M5137 Other intervertebral disc degeneration, lumbosacral region: Secondary | ICD-10-CM | POA: Diagnosis not present

## 2013-10-31 DIAGNOSIS — M9902 Segmental and somatic dysfunction of thoracic region: Secondary | ICD-10-CM | POA: Diagnosis not present

## 2013-10-31 DIAGNOSIS — M5417 Radiculopathy, lumbosacral region: Secondary | ICD-10-CM | POA: Diagnosis not present

## 2013-10-31 DIAGNOSIS — M5414 Radiculopathy, thoracic region: Secondary | ICD-10-CM | POA: Diagnosis not present

## 2013-10-31 DIAGNOSIS — M9904 Segmental and somatic dysfunction of sacral region: Secondary | ICD-10-CM | POA: Diagnosis not present

## 2013-10-31 DIAGNOSIS — M5137 Other intervertebral disc degeneration, lumbosacral region: Secondary | ICD-10-CM | POA: Diagnosis not present

## 2013-10-31 DIAGNOSIS — M9903 Segmental and somatic dysfunction of lumbar region: Secondary | ICD-10-CM | POA: Diagnosis not present

## 2013-11-02 DIAGNOSIS — R799 Abnormal finding of blood chemistry, unspecified: Secondary | ICD-10-CM | POA: Diagnosis not present

## 2013-11-03 DIAGNOSIS — R799 Abnormal finding of blood chemistry, unspecified: Secondary | ICD-10-CM | POA: Diagnosis not present

## 2013-11-03 DIAGNOSIS — R748 Abnormal levels of other serum enzymes: Secondary | ICD-10-CM | POA: Diagnosis not present

## 2013-11-07 DIAGNOSIS — M9903 Segmental and somatic dysfunction of lumbar region: Secondary | ICD-10-CM | POA: Diagnosis not present

## 2013-11-07 DIAGNOSIS — M5414 Radiculopathy, thoracic region: Secondary | ICD-10-CM | POA: Diagnosis not present

## 2013-11-07 DIAGNOSIS — M9902 Segmental and somatic dysfunction of thoracic region: Secondary | ICD-10-CM | POA: Diagnosis not present

## 2013-11-07 DIAGNOSIS — M5137 Other intervertebral disc degeneration, lumbosacral region: Secondary | ICD-10-CM | POA: Diagnosis not present

## 2013-11-07 DIAGNOSIS — M5417 Radiculopathy, lumbosacral region: Secondary | ICD-10-CM | POA: Diagnosis not present

## 2013-11-07 DIAGNOSIS — M9904 Segmental and somatic dysfunction of sacral region: Secondary | ICD-10-CM | POA: Diagnosis not present

## 2013-11-08 ENCOUNTER — Ambulatory Visit (INDEPENDENT_AMBULATORY_CARE_PROVIDER_SITE_OTHER): Payer: Medicare Other | Admitting: *Deleted

## 2013-11-08 DIAGNOSIS — Z5181 Encounter for therapeutic drug level monitoring: Secondary | ICD-10-CM

## 2013-11-08 DIAGNOSIS — Z95810 Presence of automatic (implantable) cardiac defibrillator: Secondary | ICD-10-CM | POA: Diagnosis not present

## 2013-11-08 DIAGNOSIS — Z7901 Long term (current) use of anticoagulants: Secondary | ICD-10-CM

## 2013-11-08 DIAGNOSIS — I4891 Unspecified atrial fibrillation: Secondary | ICD-10-CM

## 2013-11-08 LAB — POCT INR: INR: 2.8

## 2013-11-11 ENCOUNTER — Other Ambulatory Visit: Payer: Self-pay | Admitting: Internal Medicine

## 2013-11-29 ENCOUNTER — Encounter: Payer: Self-pay | Admitting: Internal Medicine

## 2013-11-29 ENCOUNTER — Ambulatory Visit (INDEPENDENT_AMBULATORY_CARE_PROVIDER_SITE_OTHER): Payer: Medicare Other | Admitting: Internal Medicine

## 2013-11-29 VITALS — BP 116/64 | HR 80 | Ht 69.0 in | Wt 195.8 lb

## 2013-11-29 DIAGNOSIS — I4891 Unspecified atrial fibrillation: Secondary | ICD-10-CM

## 2013-11-29 DIAGNOSIS — I5022 Chronic systolic (congestive) heart failure: Secondary | ICD-10-CM | POA: Diagnosis not present

## 2013-11-29 DIAGNOSIS — Z95810 Presence of automatic (implantable) cardiac defibrillator: Secondary | ICD-10-CM

## 2013-11-29 DIAGNOSIS — I429 Cardiomyopathy, unspecified: Secondary | ICD-10-CM

## 2013-11-29 DIAGNOSIS — I472 Ventricular tachycardia, unspecified: Secondary | ICD-10-CM

## 2013-11-29 LAB — MDC_IDC_ENUM_SESS_TYPE_INCLINIC
Battery Remaining Longevity: 84 mo
Brady Statistic AP VS Percent: 0.56 %
Brady Statistic AS VP Percent: 28.83 %
Brady Statistic AS VS Percent: 0.54 %
Brady Statistic RA Percent Paced: 70.62 %
Brady Statistic RV Percent Paced: 96.68 %
Date Time Interrogation Session: 20151124213954
HighPow Impedance: 190 Ohm
HighPow Impedance: 48 Ohm
HighPow Impedance: 60 Ohm
Lead Channel Impedance Value: 456 Ohm
Lead Channel Impedance Value: 513 Ohm
Lead Channel Pacing Threshold Amplitude: 0.625 V
Lead Channel Pacing Threshold Pulse Width: 0.4 ms
Lead Channel Pacing Threshold Pulse Width: 0.4 ms
Lead Channel Sensing Intrinsic Amplitude: 29.25 mV
Lead Channel Sensing Intrinsic Amplitude: 31.625 mV
Lead Channel Setting Pacing Amplitude: 2.5 V
Lead Channel Setting Pacing Pulse Width: 0.4 ms
MDC IDC MSMT BATTERY VOLTAGE: 2.99 V
MDC IDC MSMT LEADCHNL LV IMPEDANCE VALUE: 399 Ohm
MDC IDC MSMT LEADCHNL LV IMPEDANCE VALUE: 722 Ohm
MDC IDC MSMT LEADCHNL LV PACING THRESHOLD AMPLITUDE: 0.875 V
MDC IDC MSMT LEADCHNL RA PACING THRESHOLD AMPLITUDE: 1 V
MDC IDC MSMT LEADCHNL RA SENSING INTR AMPL: 0.75 mV
MDC IDC MSMT LEADCHNL RA SENSING INTR AMPL: 0.875 mV
MDC IDC MSMT LEADCHNL RV IMPEDANCE VALUE: 456 Ohm
MDC IDC MSMT LEADCHNL RV PACING THRESHOLD PULSEWIDTH: 0.4 ms
MDC IDC SET LEADCHNL LV PACING AMPLITUDE: 2 V
MDC IDC SET LEADCHNL RA PACING AMPLITUDE: 2 V
MDC IDC SET LEADCHNL RV PACING PULSEWIDTH: 0.4 ms
MDC IDC SET LEADCHNL RV SENSING SENSITIVITY: 0.6 mV
MDC IDC SET ZONE DETECTION INTERVAL: 320 ms
MDC IDC SET ZONE DETECTION INTERVAL: 360 ms
MDC IDC STAT BRADY AP VP PERCENT: 70.06 %
Zone Setting Detection Interval: 400 ms
Zone Setting Detection Interval: 430 ms

## 2013-11-29 NOTE — Assessment & Plan Note (Signed)
ICD interrogation demonstrates that he has had runs of slow ventricular tachycardia at rates of 140 bpm. He is asymptomatic. My inclination is to continue continue to allow him to slow ventricular tachycardia with no ICD therapies. We will await the development of symptoms.

## 2013-11-29 NOTE — Patient Instructions (Signed)
Your physician wants you to follow-up in: 12 months with Dr. Knox Saliva will receive a reminder letter in the mail two months in advance. If you don't receive a letter, please call our office to schedule the follow-up appointment.  Remote monitoring is used to monitor your Pacemaker or ICD from home. This monitoring reduces the number of office visits required to check your device to one time per year. It allows [place] to keep an eye on the functioning of your device to ensure it is working properly. You are scheduled for a device check from home on 02/28/14. You may send your transmission at any time that day. If you have a wireless device, the transmission will be sent automatically. After your physician reviews your transmission, you will receive a postcard with your next transmission date.

## 2013-11-29 NOTE — Progress Notes (Signed)
5.      HPI [name] returns today for arrhythmia followup. He is a very pleasant 78 year old man with a nonischemic cardiomyopathy, chronic systolic heart failure , status post aortic valve replacement , and atrial fibrillation. He developed worsening heart failure and required mechanical ventilation after undergoing transesophageal echo. The patient ultimately was cardioverted and placed on fairly high dose amiodarone therapy. He improved. He was discharged home and his dry weight was thought to be around 205 pounds. He has done well in the interim and weighs in at home around 200 pounds. He denies noncompliance. Previously he had a history of noncompliance along with alcohol abuse. He denies alcohol abuse as well.  He is been maintained on amiodarone 200 mg bid. No ICD shocks. No Known Allergies   Current Outpatient Prescriptions  Medication Sig Dispense Refill  . fluticasone (FLONASE) 50 MCG/ACT nasal spray Place 2 sprays into both nostrils daily as needed for allergies or rhinitis.     [facility] warfarin (COUMADIN) 2 MG tablet Take as directed by coumadin clinic 90 tablet 1  . allopurinol (ZYLOPRIM) 300 MG tablet Take 200 mg by mouth daily.     [facility] amiodarone (PACERONE) 200 MG tablet Take 1 tablet by mouth 2 (two) times daily.    [facility] amoxicillin (AMOXIL) 500 MG capsule Take 500 mg by mouth every other day.     [facility] aspirin EC 81 MG tablet Take 1 tablet (81 mg total) by mouth daily.    . carvedilol (COREG) 3.125 MG tablet Take 2 tablets (6.25 mg total) by mouth 2 (two) times daily with a meal. 120 tablet 1  . Coenzyme Q10 (CO Q-10 PO) Take 1 tablet by mouth daily.     . colchicine 0.6 MG tablet Take 0.6 mg by mouth daily as needed (gout pain).     [facility] donepezil (ARICEPT) 23 MG TABS tablet Take 1 tablet (23 mg total) by mouth at bedtime. 30 tablet 6  . furosemide (LASIX) 40 MG tablet TAKE 1 TABLET BY MOUTH DAILY MAY TAKE AN EXTRA TABLET IF WEIGHT IS MORE THAN 209LBS 30 tablet 3  . lisinopril  (PRINIVIL,ZESTRIL) 10 MG tablet Take 0.5 tablets (5 mg total) by mouth at bedtime. 30 tablet 4  . Memantine HCl ER (NAMENDA XR) 28 MG CP24 Take 28 mg by mouth daily.    . Multiple Vitamins-Minerals (MULTIVITAMIN WITH MINERALS) tablet Take 1 tablet by mouth daily.     . rosuvastatin (CRESTOR) 10 MG tablet Take 10 mg by mouth daily.      [facility] spironolactone (ALDACTONE) 25 MG tablet Take 0.5 tablets (12.5 mg total) by mouth daily. 30 tablet 3  . thiamine (VITAMIN B-1) 100 MG tablet Take 1 tablet (100 mg total) by mouth 2 (two) times daily. 60 tablet 6  . traZODone (DESYREL) 50 MG tablet Take 150 mg by mouth at bedtime.      No current facility-administered medications for this visit.     Past Medical History  Diagnosis Date  . Left bundle-branch block   . Cardiomyopathy   . Chronic systolic heart failure     ROS:   All systems reviewed and negative except as noted in the HPI.   Past Surgical History  Procedure Laterality Date  . Doppler echocardiography  2008, 2009  . Aortic valve replacement    . Tee without cardioversion N/A 10/15/2012    Procedure: TRANSESOPHAGEAL ECHOCARDIOGRAM (TEE);  Surgeon: Fay Records, MD;  Location: [place];  Service: Cardiovascular;  Laterality: N/A;  .  Cardioversion N/A 10/25/2012    Procedure: CARDIOVERSION;  Surgeon: Evans Lance, MD;  Location: [place];  Service: Cardiovascular;  Laterality: N/A;  . Cardioversion N/A 12/20/2012    Procedure: CARDIOVERSION;  Surgeon: Larey Dresser, MD;  Location: [facility] ENDOSCOPY;  Service: Cardiovascular;  Laterality: N/A;     Family History  Problem Relation Age of Onset  . Aneurysm Mother     brain  . Heart attack Father 38     History   Social History  . Marital Status: Married    Spouse Name: Jon Butler    Number of Children: 5  . Years of Education: 12   Occupational History  . retired    Social History Main Topics  . Smoking status: Never Smoker   . Smokeless tobacco: Never Used  . Alcohol  Use: 0.0 oz/week    2-3 Glasses of wine per week     Comment: 2-3 glasses  . Drug Use: No  . Sexual Activity: Not on file   Other Topics Concern  . Not on file   Social History Narrative   Patient is married Jon Butler).   Patient is retired.   5 children    12th grade   2 cups caffeine daily     BP 116/64 mmHg  Pulse 80  Ht 5\' 9"  (1.753 m)  Wt 195 lb 12.8 oz (88.814 kg)  BMI 28.90 kg/m2  Physical Exam:  Well appearing 78 year old man, NAD HEENT: Unremarkable Neck:  6 cm JVD, no thyromegally Back:  No CVA tenderness Lungs:  Clear  With no wheezes, rales, or rhonchi. HEART:  Regular rate rhythm, no murmurs, no rubs, no clicks Abd:  soft, positive bowel sounds, no organomegally, no rebound, no guarding Ext:  2 plus pulses, no edema, no cyanosis, no clubbing Skin:  No rashes no nodules Neuro:  CN II through XII intact, motor grossly intact   DEVICE  Normal device function.  See PaceArt for details.   Assess/Plan:4. Shocked Monitor

## 2013-11-29 NOTE — Assessment & Plan Note (Signed)
His current symptoms are class II. He appears to be maintaining sinus rhythm. He is encouraged to continue his current medical therapy, maintain a low-sodium diet, and avoid alcohol. In the past, he has had trouble with the latter.

## 2013-11-29 NOTE — Assessment & Plan Note (Signed)
He is maintaining sinus rhythm very nicely. He will continue his current medications. Because of his long-standing amiodarone therapy, we will obtain labs today.

## 2013-11-30 LAB — COMPREHENSIVE METABOLIC PANEL
ALBUMIN: 3.6 g/dL (ref 3.5–5.2)
ALK PHOS: 89 U/L (ref 39–117)
ALT: 45 U/L (ref 0–53)
AST: 47 U/L — ABNORMAL HIGH (ref 0–37)
BUN: 16 mg/dL (ref 6–23)
CALCIUM: 8.5 mg/dL (ref 8.4–10.5)
CHLORIDE: 98 meq/L (ref 96–112)
CO2: 31 mEq/L (ref 19–32)
Creatinine, Ser: 1.1 mg/dL (ref 0.4–1.5)
GFR: 70.63 mL/min (ref >60.00)
Glucose, Bld: 92 mg/dL (ref 70–99)
POTASSIUM: 4.4 meq/L (ref 3.5–5.1)
Sodium: 138 mEq/L (ref 135–145)
Total Bilirubin: 1 mg/dL (ref 0.2–1.2)
Total Protein: 6.3 g/dL (ref 6.0–8.3)

## 2013-11-30 LAB — T4, FREE: Free T4: 1.6 ng/dL (ref 0.60–1.60)

## 2013-11-30 LAB — TSH: TSH: 2.07 u[IU]/mL (ref 0.35–4.50)

## 2013-12-06 ENCOUNTER — Ambulatory Visit (INDEPENDENT_AMBULATORY_CARE_PROVIDER_SITE_OTHER): Payer: Medicare Other | Admitting: Pharmacist

## 2013-12-06 ENCOUNTER — Encounter: Payer: Self-pay | Admitting: Internal Medicine

## 2013-12-06 DIAGNOSIS — Z95810 Presence of automatic (implantable) cardiac defibrillator: Secondary | ICD-10-CM | POA: Diagnosis not present

## 2013-12-06 DIAGNOSIS — Z7901 Long term (current) use of anticoagulants: Secondary | ICD-10-CM | POA: Diagnosis not present

## 2013-12-06 DIAGNOSIS — I4891 Unspecified atrial fibrillation: Secondary | ICD-10-CM | POA: Diagnosis not present

## 2013-12-06 DIAGNOSIS — Z5181 Encounter for therapeutic drug level monitoring: Secondary | ICD-10-CM

## 2013-12-06 LAB — POCT INR: INR: 2.1

## 2013-12-15 ENCOUNTER — Encounter (HOSPITAL_COMMUNITY): Payer: Self-pay | Admitting: Internal Medicine

## 2014-01-05 ENCOUNTER — Other Ambulatory Visit: Payer: Self-pay | Admitting: *Deleted

## 2014-01-05 ENCOUNTER — Other Ambulatory Visit (HOSPITAL_COMMUNITY): Payer: Self-pay | Admitting: Cardiology

## 2014-01-05 MED ORDER — WARFARIN SODIUM 2 MG PO TABS: ORAL_TABLET | ORAL | Status: DC

## 2014-01-05 NOTE — Telephone Encounter (Signed)
Refill to [facility] as requested

## 2014-01-09 ENCOUNTER — Ambulatory Visit (INDEPENDENT_AMBULATORY_CARE_PROVIDER_SITE_OTHER): Payer: Medicare Other

## 2014-01-09 DIAGNOSIS — Z7901 Long term (current) use of anticoagulants: Secondary | ICD-10-CM | POA: Diagnosis not present

## 2014-01-09 DIAGNOSIS — Z5181 Encounter for therapeutic drug level monitoring: Secondary | ICD-10-CM | POA: Diagnosis not present

## 2014-01-09 DIAGNOSIS — I4891 Unspecified atrial fibrillation: Secondary | ICD-10-CM | POA: Diagnosis not present

## 2014-01-09 DIAGNOSIS — Z95810 Presence of automatic (implantable) cardiac defibrillator: Secondary | ICD-10-CM | POA: Diagnosis not present

## 2014-01-09 LAB — POCT INR: INR: 1.9

## 2014-01-09 MED ORDER — LISINOPRIL 10 MG PO TABS: 5.0000 mg | ORAL_TABLET | Freq: Every day | ORAL | Status: DC

## 2014-01-11 DIAGNOSIS — M6281 Muscle weakness (generalized): Secondary | ICD-10-CM | POA: Diagnosis not present

## 2014-01-11 DIAGNOSIS — M19072 Primary osteoarthritis, left ankle and foot: Secondary | ICD-10-CM | POA: Diagnosis not present

## 2014-01-14 DIAGNOSIS — M19072 Primary osteoarthritis, left ankle and foot: Secondary | ICD-10-CM | POA: Diagnosis not present

## 2014-02-06 ENCOUNTER — Ambulatory Visit (INDEPENDENT_AMBULATORY_CARE_PROVIDER_SITE_OTHER): Payer: Medicare Other

## 2014-02-06 DIAGNOSIS — I4891 Unspecified atrial fibrillation: Secondary | ICD-10-CM

## 2014-02-06 DIAGNOSIS — Z7901 Long term (current) use of anticoagulants: Secondary | ICD-10-CM

## 2014-02-06 DIAGNOSIS — Z95810 Presence of automatic (implantable) cardiac defibrillator: Secondary | ICD-10-CM

## 2014-02-06 DIAGNOSIS — J069 Acute upper respiratory infection, unspecified: Secondary | ICD-10-CM | POA: Diagnosis not present

## 2014-02-06 DIAGNOSIS — Z5181 Encounter for therapeutic drug level monitoring: Secondary | ICD-10-CM

## 2014-02-06 LAB — POCT INR: INR: 1.9

## 2014-02-27 DIAGNOSIS — F419 Anxiety disorder, unspecified: Secondary | ICD-10-CM | POA: Diagnosis not present

## 2014-02-27 DIAGNOSIS — E782 Mixed hyperlipidemia: Secondary | ICD-10-CM | POA: Diagnosis not present

## 2014-02-27 DIAGNOSIS — I482 Chronic atrial fibrillation: Secondary | ICD-10-CM | POA: Diagnosis not present

## 2014-02-27 DIAGNOSIS — Z952 Presence of prosthetic heart valve: Secondary | ICD-10-CM | POA: Diagnosis not present

## 2014-02-27 DIAGNOSIS — F0390 Unspecified dementia without behavioral disturbance: Secondary | ICD-10-CM | POA: Diagnosis not present

## 2014-02-27 DIAGNOSIS — I10 Essential (primary) hypertension: Secondary | ICD-10-CM | POA: Diagnosis not present

## 2014-02-27 DIAGNOSIS — M109 Gout, unspecified: Secondary | ICD-10-CM | POA: Diagnosis not present

## 2014-02-27 DIAGNOSIS — R7989 Other specified abnormal findings of blood chemistry: Secondary | ICD-10-CM | POA: Diagnosis not present

## 2014-02-28 ENCOUNTER — Telehealth: Payer: Self-pay | Admitting: Cardiology

## 2014-02-28 ENCOUNTER — Encounter: Payer: Medicare Other | Admitting: *Deleted

## 2014-02-28 NOTE — Telephone Encounter (Signed)
Confirmed remote transmission w/ pt wife.   

## 2014-03-01 ENCOUNTER — Encounter: Payer: Self-pay | Admitting: Cardiology

## 2014-03-02 ENCOUNTER — Ambulatory Visit (INDEPENDENT_AMBULATORY_CARE_PROVIDER_SITE_OTHER): Payer: Medicare Other | Admitting: *Deleted

## 2014-03-02 DIAGNOSIS — I5022 Chronic systolic (congestive) heart failure: Secondary | ICD-10-CM | POA: Diagnosis not present

## 2014-03-02 DIAGNOSIS — I429 Cardiomyopathy, unspecified: Secondary | ICD-10-CM | POA: Diagnosis not present

## 2014-03-02 DIAGNOSIS — I472 Ventricular tachycardia, unspecified: Secondary | ICD-10-CM

## 2014-03-03 NOTE — Progress Notes (Signed)
Remote ICD transmission.   

## 2014-03-06 ENCOUNTER — Ambulatory Visit (INDEPENDENT_AMBULATORY_CARE_PROVIDER_SITE_OTHER): Payer: Medicare Other | Admitting: *Deleted

## 2014-03-06 DIAGNOSIS — I4891 Unspecified atrial fibrillation: Secondary | ICD-10-CM

## 2014-03-06 DIAGNOSIS — Z95810 Presence of automatic (implantable) cardiac defibrillator: Secondary | ICD-10-CM

## 2014-03-06 DIAGNOSIS — Z7901 Long term (current) use of anticoagulants: Secondary | ICD-10-CM

## 2014-03-06 DIAGNOSIS — Z5181 Encounter for therapeutic drug level monitoring: Secondary | ICD-10-CM | POA: Diagnosis not present

## 2014-03-06 LAB — POCT INR: INR: 2.3

## 2014-03-08 DIAGNOSIS — M21372 Foot drop, left foot: Secondary | ICD-10-CM | POA: Diagnosis not present

## 2014-03-08 DIAGNOSIS — R269 Unspecified abnormalities of gait and mobility: Secondary | ICD-10-CM | POA: Diagnosis not present

## 2014-03-16 LAB — MDC_IDC_ENUM_SESS_TYPE_REMOTE
Brady Statistic AP VP Percent: 75.04 %
Brady Statistic AS VP Percent: 22.67 %
Brady Statistic RA Percent Paced: 76.13 %
Brady Statistic RV Percent Paced: 93.15 %
Date Time Interrogation Session: 20160226002046
HighPow Impedance: 46 Ohm
HighPow Impedance: 57 Ohm
Lead Channel Impedance Value: 342 Ohm
Lead Channel Impedance Value: 399 Ohm
Lead Channel Impedance Value: 703 Ohm
Lead Channel Pacing Threshold Amplitude: 0.875 V
Lead Channel Pacing Threshold Pulse Width: 0.4 ms
Lead Channel Pacing Threshold Pulse Width: 0.4 ms
Lead Channel Sensing Intrinsic Amplitude: 0.625 mV
Lead Channel Sensing Intrinsic Amplitude: 23 mV
Lead Channel Setting Pacing Amplitude: 2 V
Lead Channel Setting Pacing Pulse Width: 0.4 ms
Lead Channel Setting Pacing Pulse Width: 0.4 ms
Lead Channel Setting Sensing Sensitivity: 0.6 mV
MDC IDC MSMT BATTERY REMAINING LONGEVITY: 80 mo
MDC IDC MSMT BATTERY VOLTAGE: 2.99 V
MDC IDC MSMT LEADCHNL LV IMPEDANCE VALUE: 513 Ohm
MDC IDC MSMT LEADCHNL RA IMPEDANCE VALUE: 418 Ohm
MDC IDC MSMT LEADCHNL RA PACING THRESHOLD AMPLITUDE: 1 V
MDC IDC MSMT LEADCHNL RA SENSING INTR AMPL: 0.625 mV
MDC IDC MSMT LEADCHNL RV IMPEDANCE VALUE: 456 Ohm
MDC IDC MSMT LEADCHNL RV PACING THRESHOLD AMPLITUDE: 0.625 V
MDC IDC MSMT LEADCHNL RV PACING THRESHOLD PULSEWIDTH: 0.4 ms
MDC IDC MSMT LEADCHNL RV SENSING INTR AMPL: 23 mV
MDC IDC SET LEADCHNL LV PACING AMPLITUDE: 2 V
MDC IDC SET LEADCHNL RV PACING AMPLITUDE: 2.5 V
MDC IDC SET ZONE DETECTION INTERVAL: 400 ms
MDC IDC SET ZONE DETECTION INTERVAL: 430 ms
MDC IDC STAT BRADY AP VS PERCENT: 1.1 %
MDC IDC STAT BRADY AS VS PERCENT: 1.2 %
Zone Setting Detection Interval: 320 ms
Zone Setting Detection Interval: 360 ms

## 2014-03-21 ENCOUNTER — Ambulatory Visit (INDEPENDENT_AMBULATORY_CARE_PROVIDER_SITE_OTHER): Payer: Medicare Other | Admitting: Diagnostic Neuroimaging

## 2014-03-21 ENCOUNTER — Encounter: Payer: Self-pay | Admitting: Diagnostic Neuroimaging

## 2014-03-21 VITALS — BP 96/64 | HR 87 | Ht 70.0 in | Wt 202.0 lb

## 2014-03-21 DIAGNOSIS — G3184 Mild cognitive impairment, so stated: Secondary | ICD-10-CM | POA: Diagnosis not present

## 2014-03-21 DIAGNOSIS — F0390 Unspecified dementia without behavioral disturbance: Secondary | ICD-10-CM | POA: Diagnosis not present

## 2014-03-21 DIAGNOSIS — F03A0 Unspecified dementia, mild, without behavioral disturbance, psychotic disturbance, mood disturbance, and anxiety: Secondary | ICD-10-CM

## 2014-03-21 DIAGNOSIS — F1010 Alcohol abuse, uncomplicated: Secondary | ICD-10-CM

## 2014-03-21 NOTE — Progress Notes (Signed)
[facility]  PATIENT: Jon Butler DOB: Dec 25, 1935  REFERRING CLINICIAN:  HISTORY FROM: patient and wife  REASON FOR VISIT: follow up (transfer from Dr. Janann Colonel)   HISTORICAL  CHIEF COMPLAINT:  Chief Complaint  Patient presents with  . Cognitive decline    RM 6 - MMSE 26/30 - Depression scale -1    HISTORY OF PRESENT ILLNESS:    UPDATE 03/21/14 (VRP): Patient presents for follow-up and transfer of care. In summary patient denies any memory problems. He is not sure why he is here. He does not remember seeing Dr. Janann Colonel. He thinks his memory is normal. According to the wife, patient has had gradual, progressive short-term memory problems, forgetting recent conversations, asking questions over and over, mixing up his medications, since 2013. Patient's wife, patient's children, patient's other doctors have noticed these memory problems. Patient has stopped driving. As ago due to heart problems. He continues to take care of his own personal hygiene needs, bathing, dressing, eating. His wife has taken over finances and his medications. Patient and his wife had to sell some real estate property due to his inability to keep up with it.  I reviewed alcohol use with patient and his wife. Patient states that he drinks maybe 1 or 2 beers per day, and he does not take its problem. Patient's wife shakes her head in the background, and holds up 5 fingers, indicating that he drinks up to 5 beers per day.  Patient also has had 3 year progressive tripping and weakness in his ankles and feet, with bilateral foot drop. No specific etiology has been found. He was recommended to have ankle fusion surgery, but this was not recommended due to his heart conditions.   UPDATE 09/15/13 (PS): Last visit was 03/2013 at which time his Aricept was increased to 10mg  nightly and he was continued on Namenda 28mg  ER. Since last visit patient feels he is stable overall, wife feels slight worsening in  short term memory. He has had 2 to 3 falls since last visit. It appears to be a mechanical fall as wife notes he catches his left toes when walking. Wife notes he has difficulty raising that foot, reports having a chronic ankle injury for which he is followed by ortho surgery.   PRIOR HPI (03/23/13, Dr. Janann Colonel): 79 y.o. male here as a referral from Dr. Lysle Rubens for memory decline. Currently taking Aricept 5mg  nightly and Namenda 28mg  ER. Patient denies any concerns. Wife believes problem started around 1.5 years ago. Mainly a problem with short term memory, forgetting day to day things. They feel it is getting progressively worse with a noted drastic decline after event in October. Trouble recalling why he went into a room, forgets talking to people. Wife notes good remote memory. Takes trazodone to help with his sleep, does not sleep well without it. No hallucinations. Started Namenda in August 2014, Aricept started this month. States he drinks "a few glasses of wine" a day. He is not currently driving, stopped in October due to LOC from heart failure, he was intubated for respiratory failure at this time and spent 15 days in the hospital. Has artificial aortic valve replacement and ICD in place, currently taking coumadin.     REVIEW OF SYSTEMS: Full 14 system review of systems performed and notable only fAppetite change choking trouble swallowing cold intolerance walking difficulty agitation confusion anxiety memory loss easy bruising and bleeding.  ALLERGIES: No Known Allergies  HOME MEDICATIONS: Outpatient Prescriptions Prior to Visit  Medication Sig Dispense Refill  . allopurinol (ZYLOPRIM) 300 MG tablet Take 200 mg by mouth daily.     [facility] amiodarone (PACERONE) 200 MG tablet Take 1 tablet by mouth 2 (two) times daily.    [facility] amoxicillin (AMOXIL) 500 MG capsule Take 500 mg by mouth every other day.     [facility] aspirin EC 81 MG tablet Take 1 tablet (81 mg total) by mouth daily.    . carvedilol (COREG)  3.125 MG tablet Take 2 tablets (6.25 mg total) by mouth 2 (two) times daily with a meal. 120 tablet 1  . Coenzyme Q10 (CO Q-10 PO) Take 1 tablet by mouth daily.     . colchicine 0.6 MG tablet Take 0.6 mg by mouth daily as needed (gout pain).     [facility] donepezil (ARICEPT) 23 MG TABS tablet Take 1 tablet (23 mg total) by mouth at bedtime. 30 tablet 6  . fluticasone (FLONASE) 50 MCG/ACT nasal spray Place 2 sprays into both nostrils daily as needed for allergies or rhinitis.     . furosemide (LASIX) 40 MG tablet TAKE 1 TABLET BY MOUTH DAILY MAY TAKE AN EXTRA TABLET IF WEIGHT IS MORE THAN 209LBS 30 tablet 3  . lisinopril (PRINIVIL,ZESTRIL) 10 MG tablet Take 0.5 tablets (5 mg total) by mouth at bedtime. 30 tablet 4  . Memantine HCl ER (NAMENDA XR) 28 MG CP24 Take 28 mg by mouth daily.    . Multiple Vitamins-Minerals (MULTIVITAMIN WITH MINERALS) tablet Take 1 tablet by mouth daily.     . rosuvastatin (CRESTOR) 10 MG tablet Take 10 mg by mouth daily.      [facility] spironolactone (ALDACTONE) 25 MG tablet Take 0.5 tablets (12.5 mg total) by mouth daily. 30 tablet 3  . thiamine (VITAMIN B-1) 100 MG tablet Take 1 tablet (100 mg total) by mouth 2 (two) times daily. 60 tablet 6  . traZODone (DESYREL) 50 MG tablet Take 150 mg by mouth at bedtime.     [facility] warfarin (COUMADIN) 2 MG tablet Take as directed by coumadin clinic 90 tablet 1   No facility-administered medications prior to visit.    PAST MEDICAL HISTORY: Past Medical History  Diagnosis Date  . Left bundle-branch block   . Cardiomyopathy   . Chronic systolic heart failure     PAST SURGICAL HISTORY: Past Surgical History  Procedure Laterality Date  . Doppler echocardiography  2008, 2009  . Aortic valve replacement    . Tee without cardioversion N/A 10/15/2012    Procedure: TRANSESOPHAGEAL ECHOCARDIOGRAM (TEE);  Surgeon: Fay Records, MD;  Location: [facility] ENDOSCOPY;  Service: Cardiovascular;  Laterality: N/A;  . Cardioversion N/A 10/25/2012    Procedure:  CARDIOVERSION;  Surgeon: Evans Lance, MD;  Location: [place];  Service: Cardiovascular;  Laterality: N/A;  . Cardioversion N/A 12/20/2012    Procedure: CARDIOVERSION;  Surgeon: Larey Dresser, MD;  Location: [place];  Service: Cardiovascular;  Laterality: N/A;  . Implantable cardioverter defibrillator generator change N/A 09/20/2012    Procedure: IMPLANTABLE CARDIOVERTER DEFIBRILLATOR GENERATOR CHANGE;  Surgeon: Evans Lance, MD;  Location: [facility] CATH LAB;  Service: Cardiovascular;  Laterality: N/A;    FAMILY HISTORY: Family History  Problem Relation Age of Onset  . Aneurysm Mother     brain  . Heart attack Father 41    SOCIAL HISTORY:  History   Social History  . Marital Status: Married    Spouse Name: [name]  . Number of Children: 5  . Years of Education: 54  Occupational History  . retired    Social History Main Topics  . Smoking status: Never Smoker   . Smokeless tobacco: Never Used  . Alcohol Use: 0.0 oz/week    2-3 Glasses of wine per week     Comment: 2-3 glasses  . Drug Use: No  . Sexual Activity: Not on file   Other Topics Concern  . Not on file   Social History Narrative   Patient is married [name]).   Patient is retired.   5 children    12th grade   2 cups caffeine daily     PHYSICAL EXAM  Filed Vitals:   03/21/14 1318  BP: 96/64  Pulse: 87  Height: 5\' 10"  (1.778 m)  Weight: 202 lb (91.627 kg)    Body mass index is 28.98 kg/(m^2).  No exam data present  MMSE - Mini Mental State Exam 03/21/2014  Orientation to time 4  Orientation to Place 5  Registration 3  Attention/ Calculation 5  Recall 3  Language- name 2 objects 2  Language- repeat 1  Language- follow 3 step command 1  Language- read & follow direction 1  Write a sentence 1  Copy design 1  Total score 27    GENERAL EXAM: Patient is in no distress; well developed, nourished and groomed; neck is supple  CARDIOVASCULAR: Regular rate and rhythm; SYSTOLIC MECHANICAL  CLICK; no carotid bruits  NEUROLOGIC: MENTAL STATUS: awake, alert, oriented to person, place and time, recent and remote memory intact, normal attention and concentration, language fluent, comprehension intact, naming intact, fund of knowledge appropriate; POOR INSIGHT; ARGUMENTATIVE; DENIES DEFICITS CRANIAL NERVE: no papilledema on fundoscopic exam, pupils equal and reactive to light, visual fields full to confrontation, extraocular muscles intact, no nystagmus, facial sensation and strength symmetric, hearing intact, palate elevates symmetrically, uvula midline, shoulder shrug symmetric, tongue midline. MOTOR: normal bulk and tone, full strength in the BUE, BLE; EXCEPT BILATERAL FOOT DROP (3/5 DF) SENSORY: ABSENT VIB AT TOES AND ANKLES COORDINATION: finger-nose-finger, fine finger movements normal REFLEXES: deep tendon reflexes present and symmetric; TRACE AT KNESS; ABSENT AT ANKLES GAIT/STATION: narrow based gait; MILD STEPPAGE GAIT; CANNOT WALK ON HEELS    DIAGNOSTIC DATA (LABS, IMAGING, TESTING) - I reviewed patient records, labs, notes, testing and imaging myself where available.  Lab Results  Component Value Date   WBC 6.5 12/16/2012   HGB 13.5 12/16/2012   HCT 40.5 12/16/2012   MCV 96.4 12/16/2012   PLT 123* 12/16/2012      Component Value Date/Time   NA 138 11/29/2013 1622   K 4.4 11/29/2013 1622   CL 98 11/29/2013 1622   CO2 31 11/29/2013 1622   GLUCOSE 92 11/29/2013 1622   BUN 16 11/29/2013 1622   CREATININE 1.1 11/29/2013 1622   CALCIUM 8.5 11/29/2013 1622   PROT 6.3 11/29/2013 1622   ALBUMIN 3.6 11/29/2013 1622   AST 47* 11/29/2013 1622   ALT 45 11/29/2013 1622   ALKPHOS 89 11/29/2013 1622   BILITOT 1.0 11/29/2013 1622   GFRNONAA 68* 12/20/2012 1219   GFRAA 79* 12/20/2012 1219   Lab Results  Component Value Date   CHOL  03/29/2007    183        ATP III CLASSIFICATION:  <200     mg/dL   Desirable  200-239  mg/dL   Borderline High  >=240    mg/dL   High    HDL 50 03/29/2007   LDLCALC * 03/29/2007    106  Total Cholesterol/HDL:CHD Risk Coronary Heart Disease Risk Table                     Men   Women  1/2 Average Risk   3.4   3.3   TRIG 133 03/29/2007   CHOLHDL 3.7 03/29/2007   Lab Results  Component Value Date   HGBA1C  03/29/2007    5.4 (NOTE)   The ADA recommends the following therapeutic goals for glycemic   control related to Hgb A1C measurement:   Goal of Therapy:   < 7.0% Hgb A1C   Action Suggested:  > 8.0% Hgb A1C   Ref:  Diabetes Care, 22, Suppl. 1, 1999   Lab Results  Component Value Date   VITAMINB12 >1999* 03/30/2013   Lab Results  Component Value Date   TSH 2.07 11/29/2013    I reviewed images myself and agree with interpretation. -VRP  03/28/13 CT head (without) demonstrating: 1. Mild ventriculomegaly may reflect mild subcortical atrophy.  2. No acute findings. 3. No change from 06/18/12.  10/15/12 TEE  - Left atrium: LA appendage with echodensities near tip that are consistent with thrombus. - Right atrium: Defibrillator wires present in RA There are mobile echodensities attached to wires thatare consistentwith vegetations. Cannot completely excludethombus though unusual location.  06/18/12 CAROTID U/S 1. Bilateral atherosclerotic plaque, not resulting in hemodynamically significant stenosis. 2. Incidental note made of an approximately 1.3 cm mixed echogenic solid nodule within the right lobe of the thyroid. Further evaluation with dedicated thyroid ultrasound may be performed as clinically indicated.      ASSESSMENT AND PLAN  79 y.o. year old male here with progressive cognitive decline and memory loss, with significant denial of deficits. Also with marital tension, excess alcohol use. Also with unexplained bilateral foot drop since 2013 (neuropathy vs lumbar radiculopathy; apparently had workup and EMG/NCS testing via orthopedic clinic).  Office based cognitive testing has shown: 03/23/13 [facility]  19/30 03/22/14 MMSE 27/30  Ddx memory loss: MCI vs mild/prodromal dementia vs alcoholic dementia  Ddx foot drop: neuropathy (alcoholic vs idiopathic vs lumbar radiculopathies)   PLAN: - continue aricept and namenda - I spent 30 minutes of face to face time with patient. Greater than 50% of time was spent in counseling and coordination of care with patient. In summary we discussed dementia/MCI diagnosis, prognosis, treatment options, home safety, advanced care planning, end of life planning, caregiver stress. Patient has significant denial of deficits and poor insight. There is some marital discord. There is probably excessive alcohol abuse.  Return in about 1 year (around 03/21/2015).    Penni Bombard, MD 6/96/2952, 8:41 PM Certified in Neurology, Neurophysiology and Neuroimaging  [facility] [address] [place], [place] [address] [phone]

## 2014-03-21 NOTE — Patient Instructions (Signed)
Caution with daily alcohol use/abuse.  Continue namenda and aricept.

## 2014-03-23 ENCOUNTER — Encounter: Payer: Self-pay | Admitting: Cardiology

## 2014-03-27 ENCOUNTER — Other Ambulatory Visit: Payer: Self-pay | Admitting: Internal Medicine

## 2014-03-29 ENCOUNTER — Encounter: Payer: Self-pay | Admitting: Internal Medicine

## 2014-03-31 ENCOUNTER — Ambulatory Visit (INDEPENDENT_AMBULATORY_CARE_PROVIDER_SITE_OTHER): Payer: Medicare Other | Admitting: Pharmacist

## 2014-03-31 ENCOUNTER — Other Ambulatory Visit (INDEPENDENT_AMBULATORY_CARE_PROVIDER_SITE_OTHER): Payer: Medicare Other | Admitting: *Deleted

## 2014-03-31 DIAGNOSIS — Z5181 Encounter for therapeutic drug level monitoring: Secondary | ICD-10-CM

## 2014-03-31 DIAGNOSIS — Z7901 Long term (current) use of anticoagulants: Secondary | ICD-10-CM

## 2014-03-31 DIAGNOSIS — Z95810 Presence of automatic (implantable) cardiac defibrillator: Secondary | ICD-10-CM | POA: Diagnosis not present

## 2014-03-31 DIAGNOSIS — I5023 Acute on chronic systolic (congestive) heart failure: Secondary | ICD-10-CM | POA: Diagnosis not present

## 2014-03-31 DIAGNOSIS — I4891 Unspecified atrial fibrillation: Secondary | ICD-10-CM

## 2014-03-31 LAB — HEPATIC FUNCTION PANEL
ALT: 24 U/L (ref 0–53)
AST: 24 U/L (ref 0–37)
Albumin: 3.6 g/dL (ref 3.5–5.2)
Alkaline Phosphatase: 73 U/L (ref 39–117)
BILIRUBIN DIRECT: 0.2 mg/dL (ref 0.0–0.3)
BILIRUBIN INDIRECT: 0.7 mg/dL (ref 0.2–1.2)
Total Bilirubin: 0.9 mg/dL (ref 0.2–1.2)
Total Protein: 6.2 g/dL (ref 6.0–8.3)

## 2014-03-31 LAB — POCT INR: INR: 2

## 2014-03-31 NOTE — Addendum Note (Signed)
Addended by: Eulis Foster on: 03/31/2014 03:03 PM   Modules accepted: Orders

## 2014-04-20 ENCOUNTER — Other Ambulatory Visit: Payer: Self-pay

## 2014-04-20 MED ORDER — DONEPEZIL HCL 23 MG PO TABS: 23.0000 mg | ORAL_TABLET | Freq: Every day | ORAL | Status: DC

## 2014-04-20 MED ORDER — MEMANTINE HCL ER 28 MG PO CP24: 28.0000 mg | ORAL_CAPSULE | Freq: Every day | ORAL | Status: DC

## 2014-04-28 ENCOUNTER — Ambulatory Visit (INDEPENDENT_AMBULATORY_CARE_PROVIDER_SITE_OTHER): Payer: Medicare Other | Admitting: *Deleted

## 2014-04-28 DIAGNOSIS — Z7901 Long term (current) use of anticoagulants: Secondary | ICD-10-CM | POA: Diagnosis not present

## 2014-04-28 DIAGNOSIS — I4891 Unspecified atrial fibrillation: Secondary | ICD-10-CM

## 2014-04-28 DIAGNOSIS — Z5181 Encounter for therapeutic drug level monitoring: Secondary | ICD-10-CM | POA: Diagnosis not present

## 2014-04-28 DIAGNOSIS — Z95810 Presence of automatic (implantable) cardiac defibrillator: Secondary | ICD-10-CM | POA: Diagnosis not present

## 2014-04-28 LAB — POCT INR: INR: 1.9

## 2014-05-01 IMAGING — CR DG CHEST 1V PORT
1 series · 1 of 1 positions shown · non-contrast
Comparison: 04/02/2007

CLINICAL DATA: 37-year-old male with shortness of breath.

EXAM:
PORTABLE CHEST - 1 VIEW

[AP]
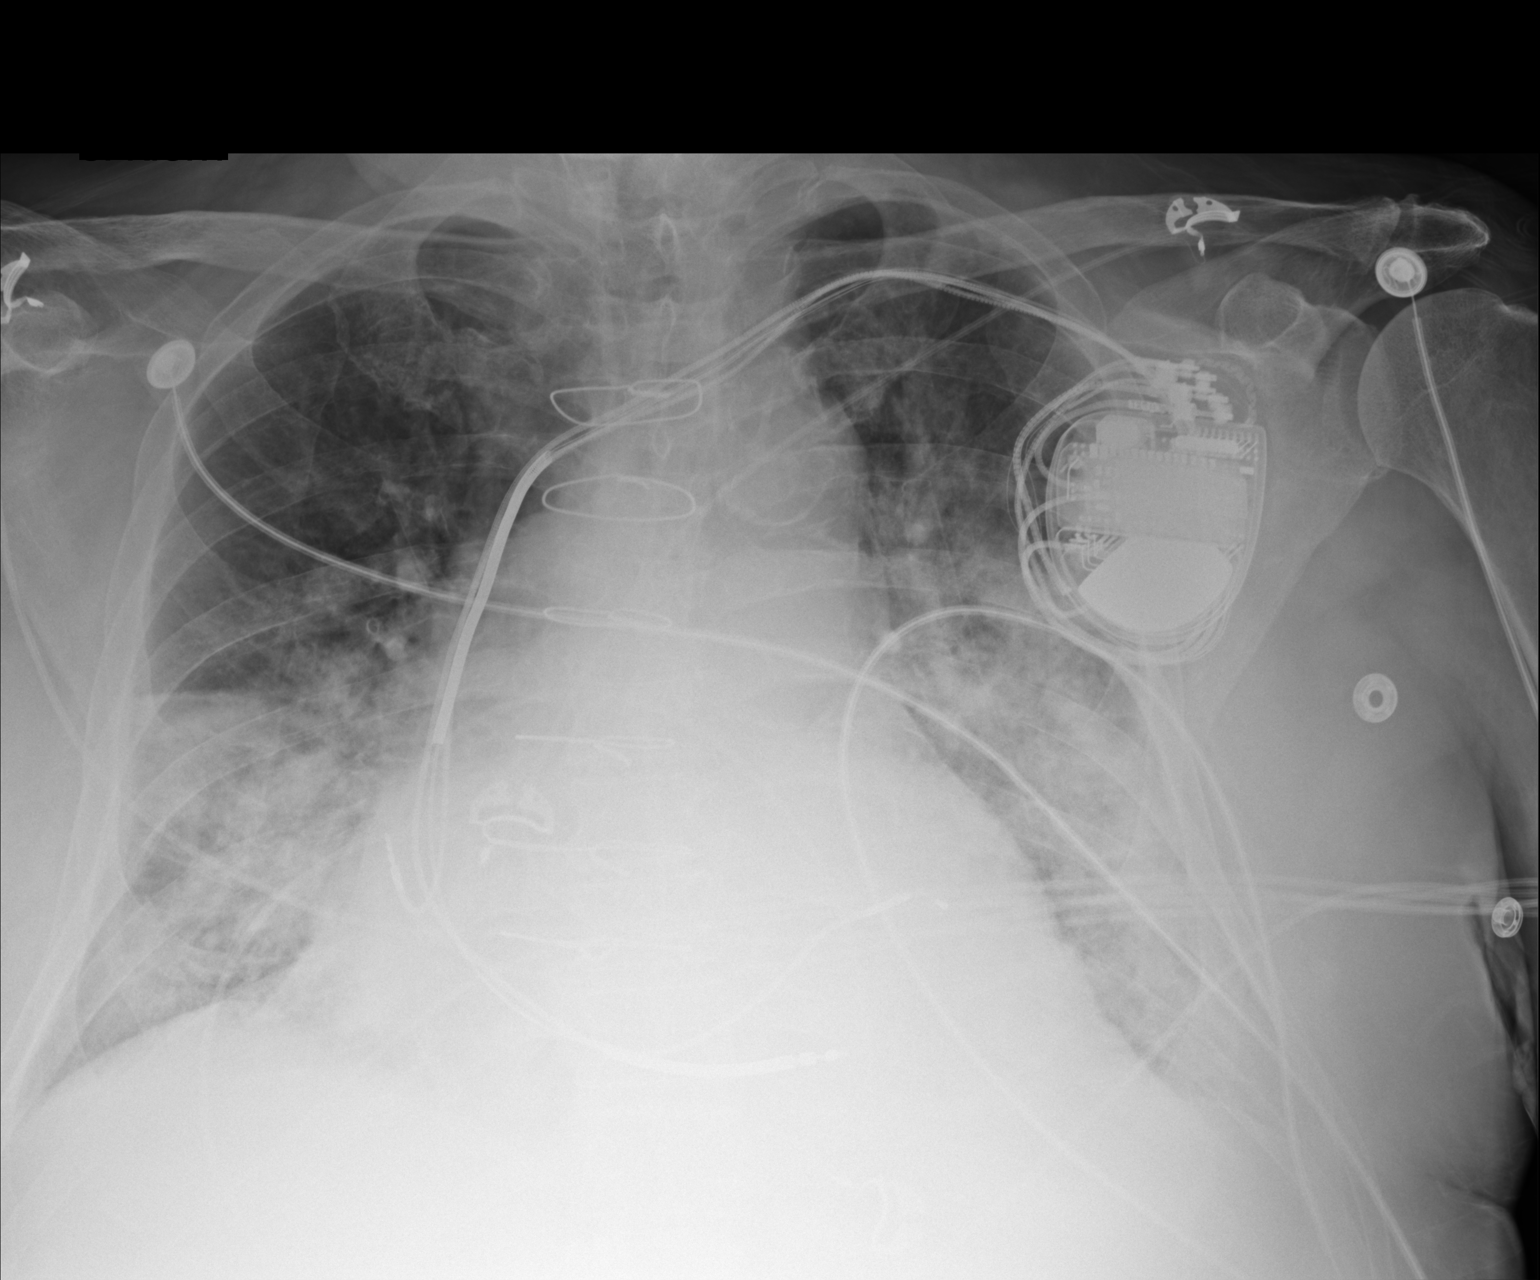

[1 of 1 positions shown; findings below may reference images not displayed]

FINDINGS: Cardiomegaly, cardiac surgical changes and left-sided pacemaker/AICD
again noted.

Bilateral airspace opacities are identified - compatible with
pulmonary edema.

Mild basilar atelectasis is noted.

No large pleural effusions are identified on this study.

There is no evidence of pneumothorax or acute bony abnormality.
IMPRESSION: Cardiomegaly with moderate pulmonary edema.

## 2014-05-01 IMAGING — CR DG CHEST 1V PORT
1 series · 1 of 1 positions shown · non-contrast
Comparison: October 15, 2012.

CLINICAL DATA: Endotracheal tube placement.

EXAM:
PORTABLE CHEST - 1 VIEW

[AP]
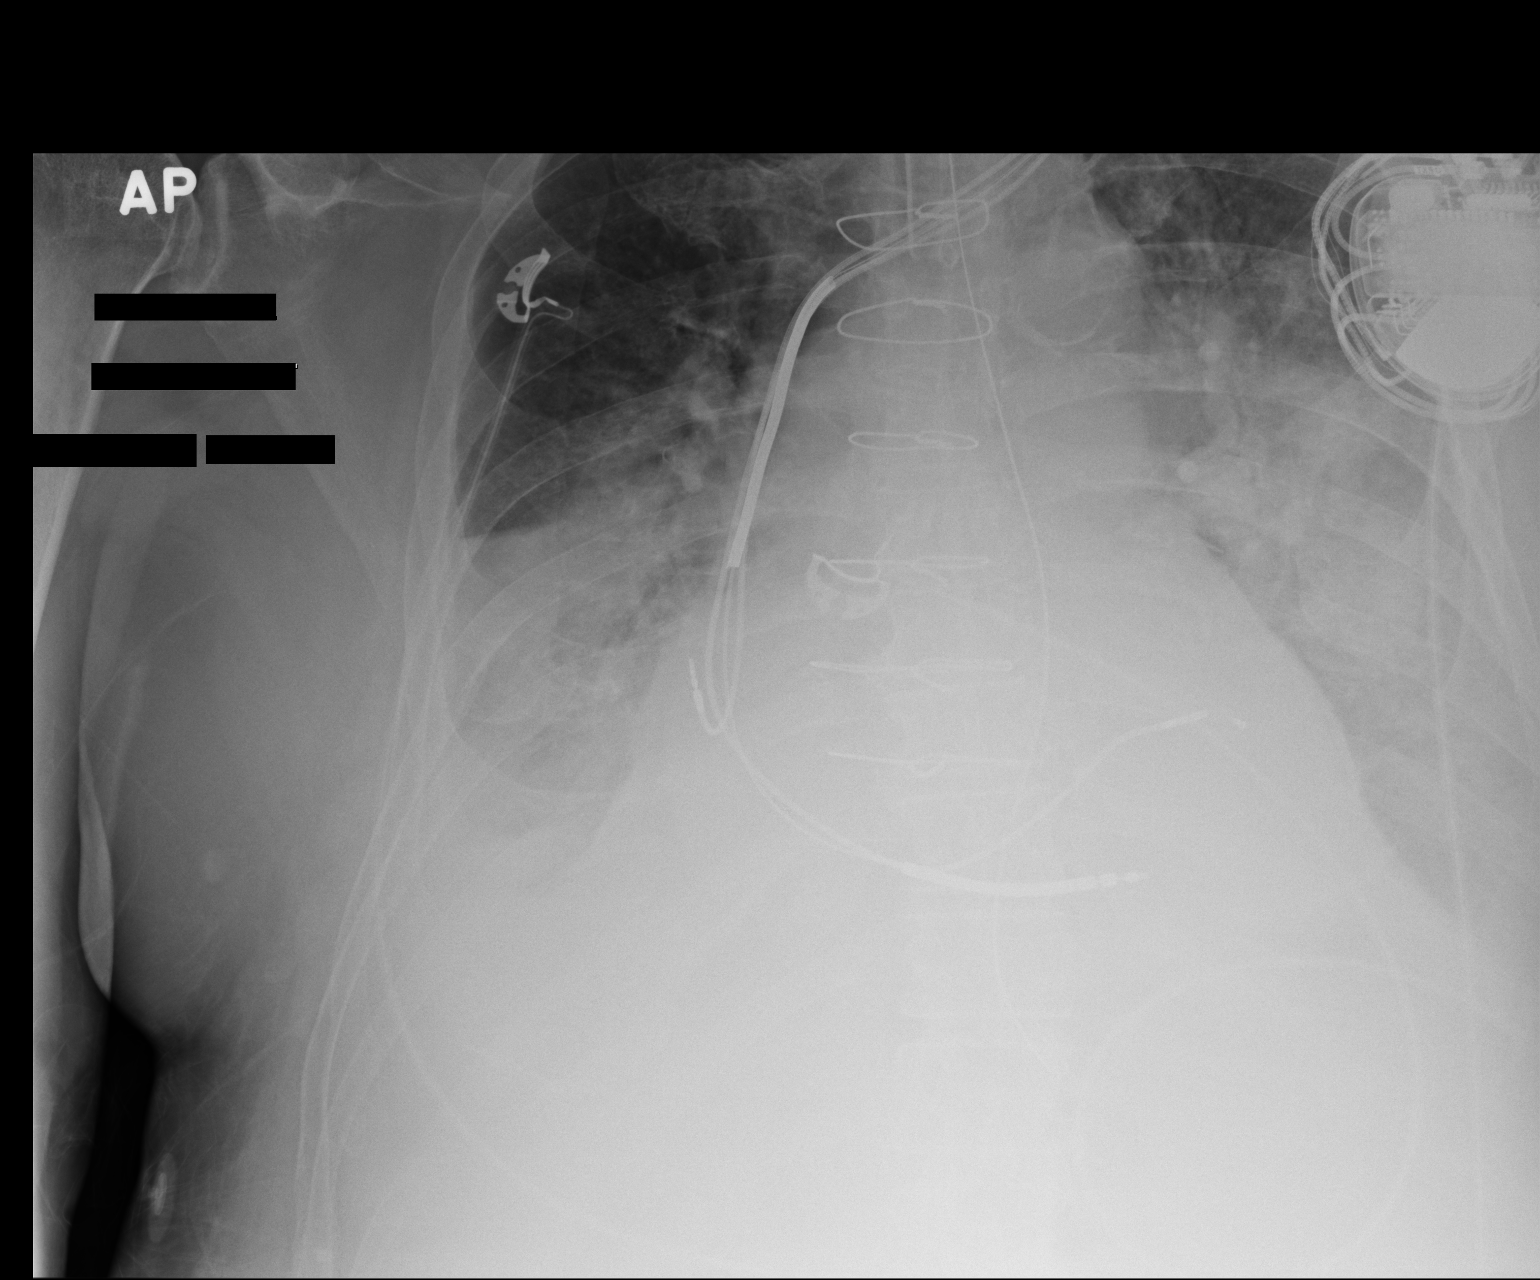

[1 of 1 positions shown; findings below may reference images not displayed]

FINDINGS: There has been interval placement of endotracheal tube with distal
tip 4 cm above the carinal. Sternotomy wires are noted. Left-sided
pacemaker is unchanged bilateral airspace opacities and effusions
are again noted and unchanged.
IMPRESSION: No change involving bilateral airspace opacities and effusions.
Endotracheal tube in grossly good position.

## 2014-05-01 IMAGING — CR DG CHEST 1V PORT
2 series · 2 of 2 positions shown · non-contrast
Comparison: Earlier today.

CLINICAL DATA: PICC placement.

EXAM:
PORTABLE CHEST - 1 VIEW

[AP (1 of 2)]
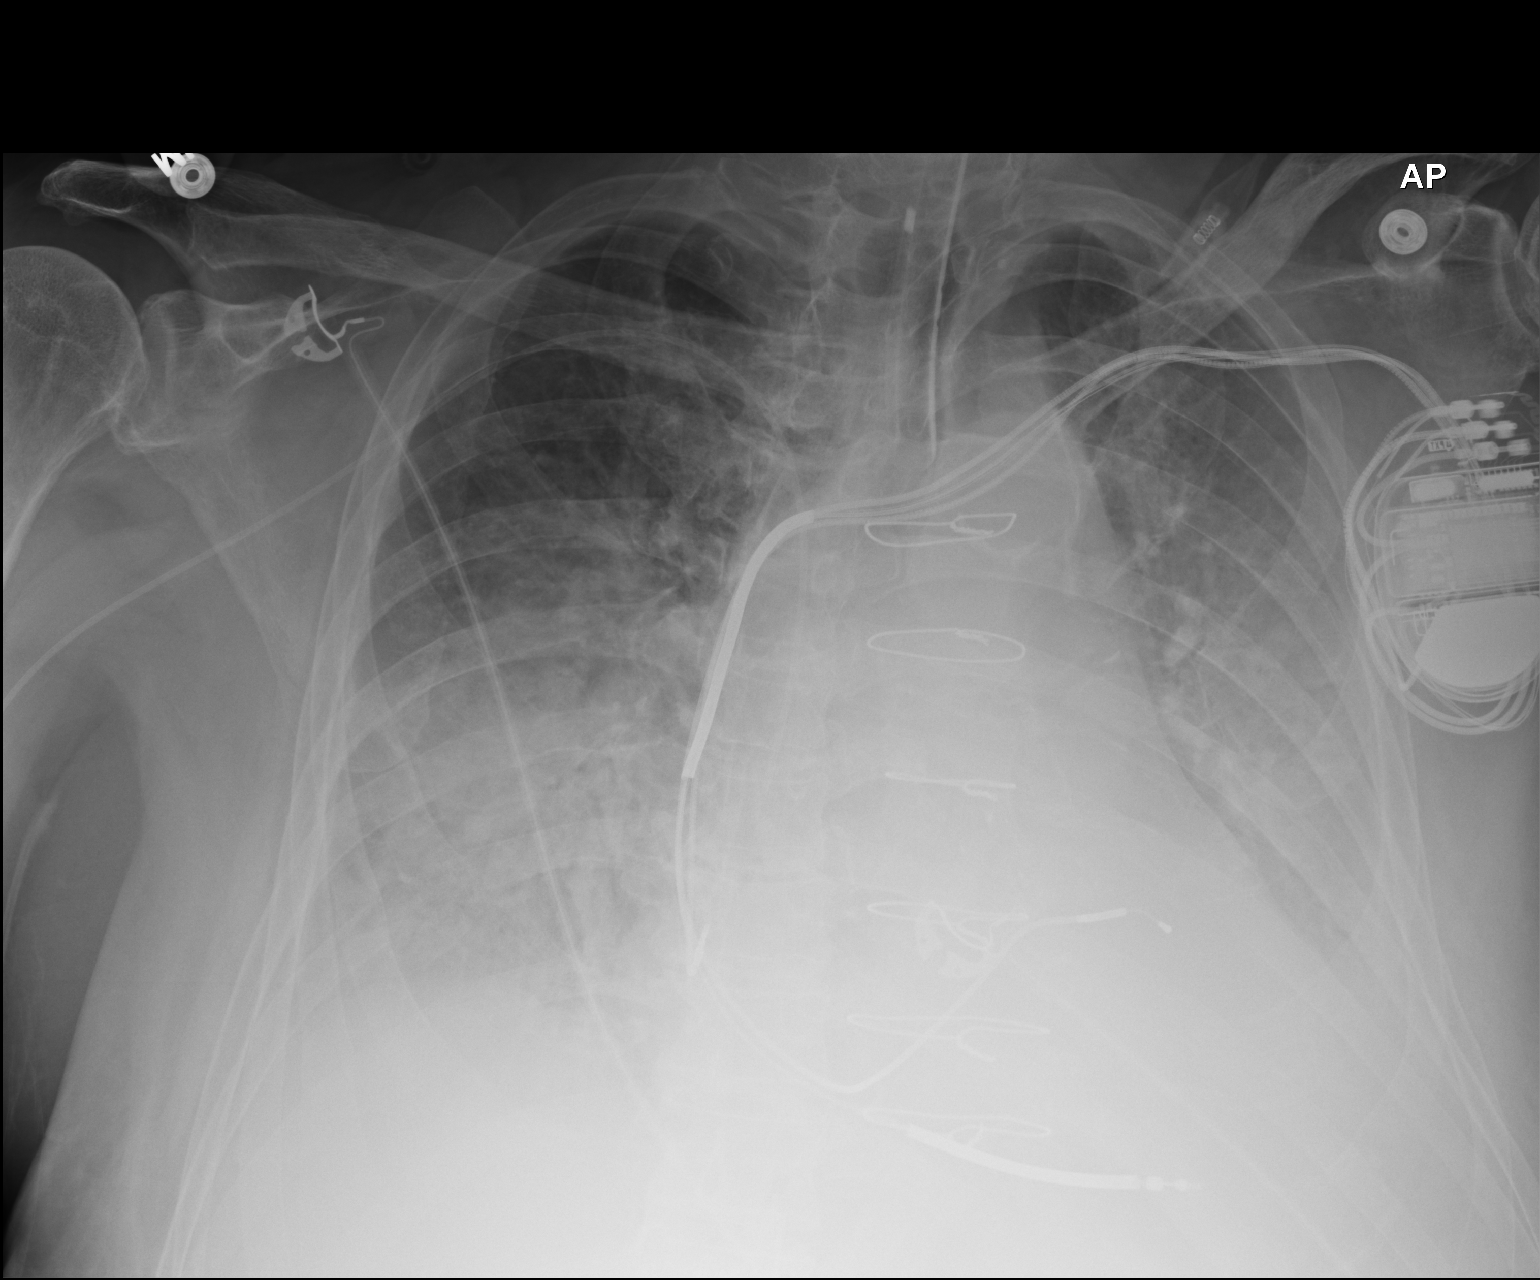

[AP (2 of 2)]
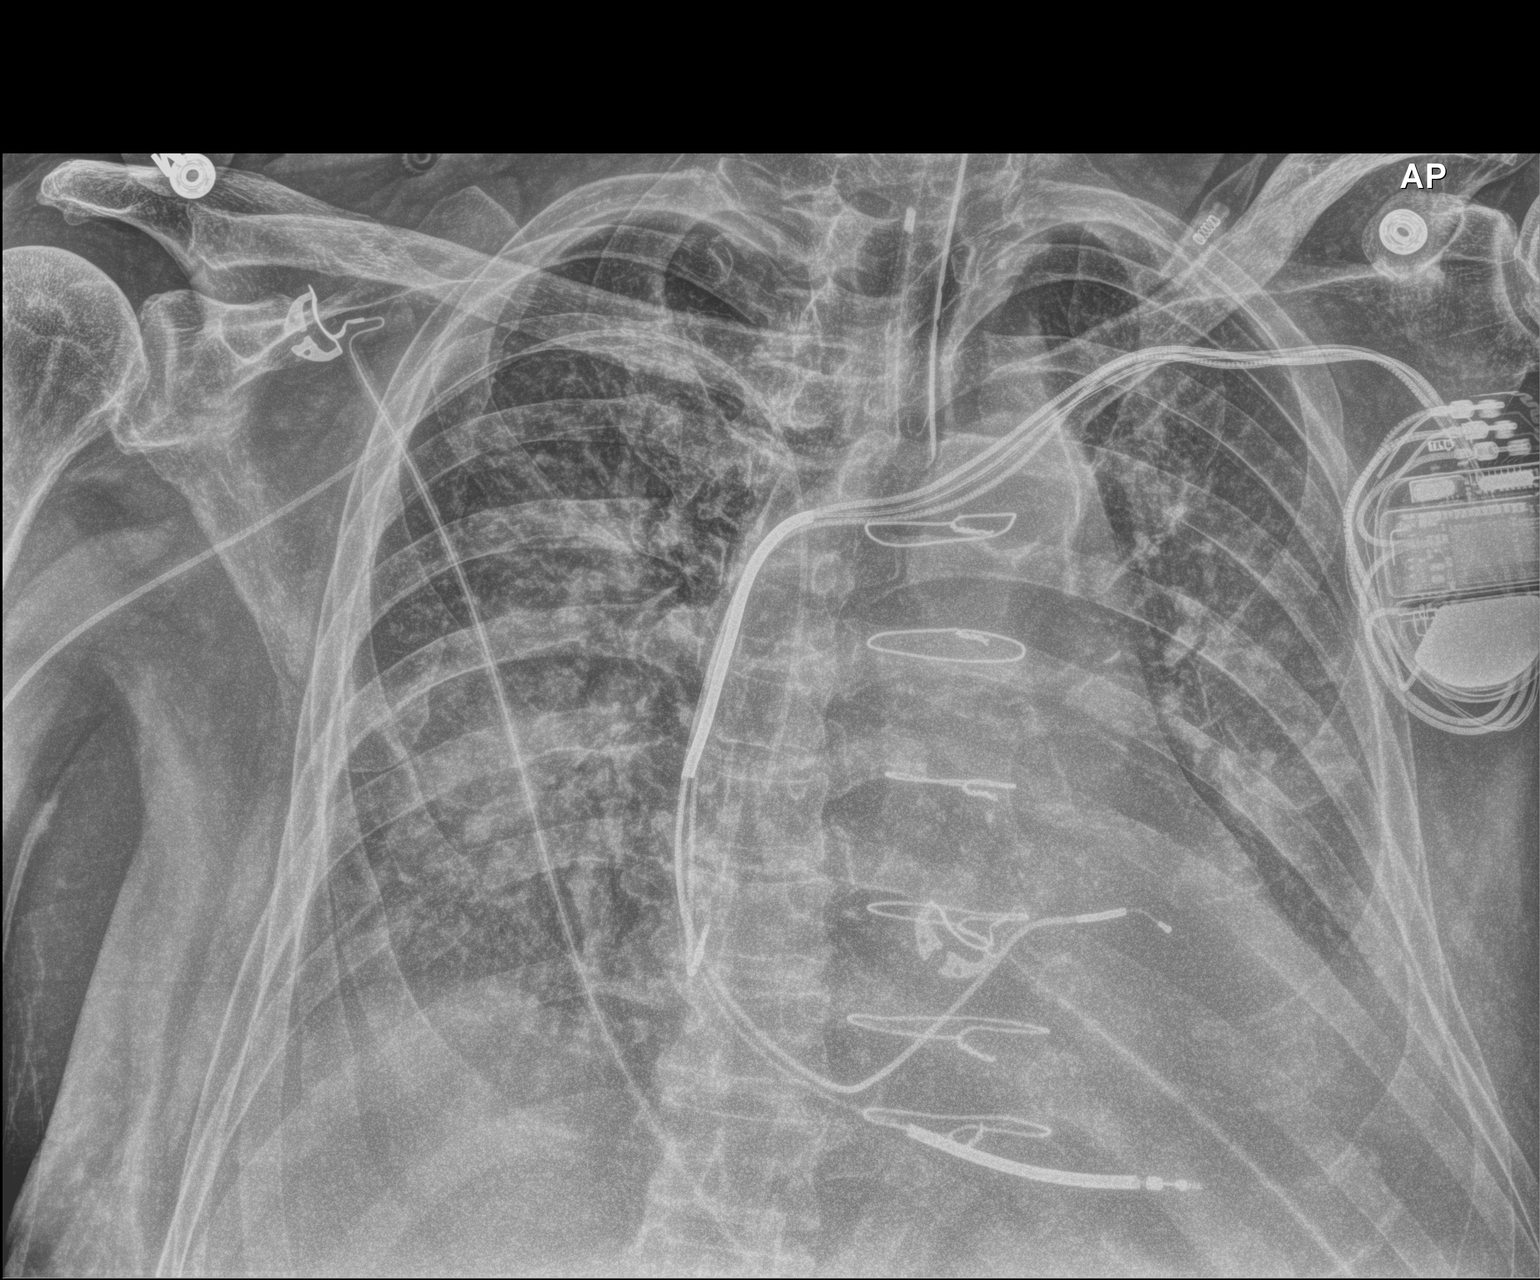

[2 of 2 positions shown; findings below may reference images not displayed]

FINDINGS: The endotracheal tube remains in satisfactory position. Interval
right PICC with its tip in the inferior aspect of the superior vena
cava near its junction with the right atrium. Stable left subclavian
pacer and AICD leads. Stable enlarged cardiac silhouette and dense
left lower lobe opacity. Slightly more confluent opacity in the mid
and lower lung zones elsewhere bilaterally. Continued evidence of
bilateral pleural fluid. No acute bony abnormality.
IMPRESSION: 1. Right PICC tip near the cavoatrial junction.
2. Mildly progressive pulmonary edema and bilateral pleural fluid.
Underlying pneumonia cannot be excluded.

## 2014-05-02 DIAGNOSIS — Z961 Presence of intraocular lens: Secondary | ICD-10-CM | POA: Diagnosis not present

## 2014-05-02 DIAGNOSIS — H26493 Other secondary cataract, bilateral: Secondary | ICD-10-CM | POA: Diagnosis not present

## 2014-05-02 DIAGNOSIS — H10413 Chronic giant papillary conjunctivitis, bilateral: Secondary | ICD-10-CM | POA: Diagnosis not present

## 2014-05-02 DIAGNOSIS — H3531 Nonexudative age-related macular degeneration: Secondary | ICD-10-CM | POA: Diagnosis not present

## 2014-05-19 DIAGNOSIS — R531 Weakness: Secondary | ICD-10-CM | POA: Diagnosis not present

## 2014-05-19 DIAGNOSIS — I959 Hypotension, unspecified: Secondary | ICD-10-CM | POA: Diagnosis not present

## 2014-05-19 DIAGNOSIS — R0982 Postnasal drip: Secondary | ICD-10-CM | POA: Diagnosis not present

## 2014-05-24 ENCOUNTER — Ambulatory Visit (HOSPITAL_COMMUNITY)
Admission: RE | Admit: 2014-05-24 | Discharge: 2014-05-24 | Disposition: A | Discharge status: Home / Self Care | Payer: Medicare Other | Source: Ambulatory Visit | Attending: Internal Medicine | Admitting: Internal Medicine

## 2014-05-24 VITALS — BP 124/76 | HR 88 | Wt 208.8 lb

## 2014-05-24 DIAGNOSIS — Z7901 Long term (current) use of anticoagulants: Secondary | ICD-10-CM | POA: Diagnosis not present

## 2014-05-24 DIAGNOSIS — I5022 Chronic systolic (congestive) heart failure: Secondary | ICD-10-CM | POA: Diagnosis not present

## 2014-05-24 DIAGNOSIS — Z95810 Presence of automatic (implantable) cardiac defibrillator: Secondary | ICD-10-CM | POA: Diagnosis not present

## 2014-05-24 DIAGNOSIS — I480 Paroxysmal atrial fibrillation: Secondary | ICD-10-CM | POA: Diagnosis not present

## 2014-05-24 DIAGNOSIS — Z9119 Patient's noncompliance with other medical treatment and regimen: Secondary | ICD-10-CM | POA: Insufficient documentation

## 2014-05-24 DIAGNOSIS — N183 Chronic kidney disease, stage 3 (moderate): Secondary | ICD-10-CM | POA: Insufficient documentation

## 2014-05-24 DIAGNOSIS — Z952 Presence of prosthetic heart valve: Secondary | ICD-10-CM | POA: Diagnosis not present

## 2014-05-24 DIAGNOSIS — Z79899 Other long term (current) drug therapy: Secondary | ICD-10-CM | POA: Diagnosis not present

## 2014-05-24 DIAGNOSIS — F0390 Unspecified dementia without behavioral disturbance: Secondary | ICD-10-CM | POA: Insufficient documentation

## 2014-05-24 DIAGNOSIS — I429 Cardiomyopathy, unspecified: Secondary | ICD-10-CM | POA: Diagnosis not present

## 2014-05-24 DIAGNOSIS — I447 Left bundle-branch block, unspecified: Secondary | ICD-10-CM | POA: Insufficient documentation

## 2014-05-24 DIAGNOSIS — I4891 Unspecified atrial fibrillation: Secondary | ICD-10-CM | POA: Insufficient documentation

## 2014-05-24 MED ORDER — FUROSEMIDE 40 MG PO TABS: 40.0000 mg | ORAL_TABLET | Freq: Every day | ORAL | Status: DC

## 2014-05-24 MED ORDER — METOLAZONE 2.5 MG PO TABS: 2.5000 mg | ORAL_TABLET | ORAL | Status: DC

## 2014-05-24 MED ORDER — POTASSIUM CHLORIDE CRYS ER 20 MEQ PO TBCR: 20.0000 meq | EXTENDED_RELEASE_TABLET | ORAL | Status: DC

## 2014-05-24 NOTE — Patient Instructions (Addendum)
Increase Furosemide to 40 mg daily  Take Metolazone 2.5 mg daily for 3 DAYS ONLY, then take only as needed  Take Potassium 20 meq (1 tab) when you take Metolazone  Labs Friday 5/20 at [place] recommends that you schedule a follow-up appointment in: 1 month

## 2014-05-24 NOTE — Progress Notes (Signed)
Patient ID: Jon Butler, male   DOB: [birth year], 79 y.o.   MRN: [id number]   Weight Range   Baseline proBNP    PCP: Jon Butler EP: [facility] HPI: Jon Butler is a 79 yo male with a history of chronic systolic HF EF 24% due to NICM s/p CRT-D (Medtronic), mechanical AVR (St Jude), LBBB, PAF S/P DC-CV 12/20/12, ETOH abuse and dementia and non-compliance.   Admitted to [facility] 10/10 through 10/26/12 with ADHF in setting of recurrent AF. Developed cardiogenic shock and VDRF   BB and ACE-I held and amiodarone gtt was started. Required short term Milrinone and lasix.Diuresed 26 pounds and transitioned to 40 mg daily. DC-CV on 10/20. Remains on Coumadin. D/C weight 206 pounds.   S/P successful DC-CV 12/20/12. Amio increased to 400 bid.   Recently saw Jon. Sherron Butler in Neurology for progressive cognitive decline in setting of ETOH abuse. Also had foot drop.   Returns for HF f/u. Says about 1 week ago Jon Butler cut lasix back. Unclear why it was done. No bloodwork drawn. Weight usually 195. More recently 200. He is 208 here.  Denies SOB/PND/Orthopnea. + fatigue and ab bloating. SBP at home 108-110. His Jon prepares his medications. Drinking 3 beers a day. Coumadin followed at [facility]. No bleeding currently.   11/01/12 Labs INR > 8 10/26/12 K 3.0 Creatinine 1.23  12/16/12 K 4.6 Creatinine 1.05 12/20/12 K 4.0 Creatinine 1.04 11/15      K 4.4 Creatinine 1.1  Optivol : Fluid trending up sin 4/12. Now over threshold.  No AFib/VT  Activity ~ 2 hours per day.   ROS: All systems negative except as listed in HPI, PMH and Problem List.  Past Medical History  Diagnosis Date  . Left bundle-branch block   . Cardiomyopathy   . Chronic systolic heart failure     Current Outpatient Prescriptions  Medication Sig Dispense Refill  . allopurinol (ZYLOPRIM) 300 MG tablet Take 200 mg by mouth daily.     Marland Kitchen amiodarone (PACERONE) 200 MG tablet Take 1 tablet by mouth 2 (two) times daily.    Marland Kitchen  amoxicillin (AMOXIL) 500 MG capsule Take 500 mg by mouth every other day.     Marland Kitchen aspirin EC 81 MG tablet Take 1 tablet (81 mg total) by mouth daily.    . carvedilol (COREG) 3.125 MG tablet Take 2 tablets (6.25 mg total) by mouth 2 (two) times daily with a meal. 120 tablet 1  . Coenzyme Q10 (CO Q-10 PO) Take 1 tablet by mouth daily.     . colchicine 0.6 MG tablet Take 0.6 mg by mouth daily as needed (gout pain).     Marland Kitchen donepezil (ARICEPT) 23 MG TABS tablet Take 1 tablet (23 mg total) by mouth at bedtime. 30 tablet 6  . fluticasone (FLONASE) 50 MCG/ACT nasal spray Place 2 sprays into both nostrils daily as needed for allergies or rhinitis.     . furosemide (LASIX) 40 MG tablet TAKE 1 TABLET BY MOUTH DAILY MAY TAKE AN EXTRA TABLET IF WEIGHT IS MORE THAN 209LBS 30 tablet 5  . lisinopril (PRINIVIL,ZESTRIL) 10 MG tablet Take 0.5 tablets (5 mg total) by mouth at bedtime. 30 tablet 4  . memantine (NAMENDA XR) 28 MG CP24 24 hr capsule Take 1 capsule (28 mg total) by mouth daily. 30 capsule 6  . Multiple Vitamins-Minerals (MULTIVITAMIN WITH MINERALS) tablet Take 1 tablet by mouth daily.     . rosuvastatin (CRESTOR) 10 MG tablet Take 10 mg  by mouth daily.      Marland Kitchen spironolactone (ALDACTONE) 25 MG tablet Take 0.5 tablets (12.5 mg total) by mouth daily. 30 tablet 3  . thiamine (VITAMIN B-1) 100 MG tablet Take 1 tablet (100 mg total) by mouth 2 (two) times daily. 60 tablet 6  . traZODone (DESYREL) 50 MG tablet Take 150 mg by mouth at bedtime.     Marland Kitchen warfarin (COUMADIN) 2 MG tablet Take as directed by coumadin clinic 90 tablet 1   No current facility-administered medications for this encounter.   Filed Vitals:   05/24/14 1558  BP: 124/76  Pulse: 88  Weight: 208 lb 12 oz (94.688 kg)  SpO2: 97%   PHYSICAL EXAM: General:  Elderly. No resp difficulty Jon Butler HEENT: normal Neck: supple. JVP to jaw. Carotids 2+ bilaterally; no bruits. No lymphadenopathy or thryomegaly appreciated. Cor: PMI normal. Regular  rate & rhythm. Mechanical s2 No rubs, gallops 2/6 TR. Lungs: clear Abdomen: soft, nontender, nondistended. No hepatosplenomegaly. No bruits or masses. Good bowel sounds. Extremities: no cyanosis, clubbing, rash, 2+  edema Neuro: alert & orientedx3, cranial nerves grossly intact. Moves all 4 extremities w/o difficulty. Affect pleasant.  ASSESSMENT & PLAN: 1. Chronic Systolic Heart Failure EF 20%  S/P Medtronic CRT-D 09/2012  - NYHA II-III symptoms. Volume status up 15 pounds.  Would like to keep his weight at home 194-197 pounds. - Increase lasix back to 40 daily. Add metolazone 2.5 and kcl 20 for 3 days and prn. Check BMET on Friday.   - Continue current meds. Titration limited by hypotension.  - Reinforced the need and ifmportance of daily weights, a low sodium diet, and fluid restriction (less than 2 L a day). Instructed to call the HF clinic if weight increases more than 3 lbs overnight or 5 lbs in a week.   2. A Fib-  S/P DC-CV 12/20/12. Continue amiodarone. On coumadin No bleeding problems. 3. CKD stage 3- stable will continue to follow 4. Alcohol- Reinforced alcohol abstinence especially with coumadin.  5. Memory loss-  Neurology following. On namenda and aricept. Needs to stop drinking ETOH.  6. Mechanical AVR - on coumadin. SBE prophylaxis for all procedures.   Follow up in 4 weeks.   Jon Bickers, MD  3:54 PM

## 2014-05-26 ENCOUNTER — Ambulatory Visit (INDEPENDENT_AMBULATORY_CARE_PROVIDER_SITE_OTHER): Payer: Medicare Other | Admitting: *Deleted

## 2014-05-26 ENCOUNTER — Other Ambulatory Visit (INDEPENDENT_AMBULATORY_CARE_PROVIDER_SITE_OTHER): Payer: Medicare Other | Admitting: *Deleted

## 2014-05-26 DIAGNOSIS — I5022 Chronic systolic (congestive) heart failure: Secondary | ICD-10-CM

## 2014-05-26 DIAGNOSIS — Z95810 Presence of automatic (implantable) cardiac defibrillator: Secondary | ICD-10-CM | POA: Diagnosis not present

## 2014-05-26 DIAGNOSIS — I4891 Unspecified atrial fibrillation: Secondary | ICD-10-CM

## 2014-05-26 DIAGNOSIS — Z7901 Long term (current) use of anticoagulants: Secondary | ICD-10-CM

## 2014-05-26 DIAGNOSIS — Z5181 Encounter for therapeutic drug level monitoring: Secondary | ICD-10-CM | POA: Diagnosis not present

## 2014-05-26 LAB — BASIC METABOLIC PANEL
BUN: 15 mg/dL (ref 6–23)
CO2: 34 mEq/L — ABNORMAL HIGH (ref 19–32)
Calcium: 8.5 mg/dL (ref 8.4–10.5)
Chloride: 100 mEq/L (ref 96–112)
Creatinine, Ser: 0.94 mg/dL (ref 0.40–1.50)
GFR: 82.79 mL/min (ref >60.00)
Glucose, Bld: 96 mg/dL (ref 70–99)
Potassium: 4.3 mEq/L (ref 3.5–5.1)
SODIUM: 137 meq/L (ref 135–145)

## 2014-05-26 LAB — POCT INR: INR: 2

## 2014-06-06 ENCOUNTER — Encounter: Payer: Self-pay | Admitting: Internal Medicine

## 2014-06-06 ENCOUNTER — Ambulatory Visit (INDEPENDENT_AMBULATORY_CARE_PROVIDER_SITE_OTHER): Payer: Medicare Other | Admitting: *Deleted

## 2014-06-06 DIAGNOSIS — I5022 Chronic systolic (congestive) heart failure: Secondary | ICD-10-CM | POA: Diagnosis not present

## 2014-06-06 DIAGNOSIS — I429 Cardiomyopathy, unspecified: Secondary | ICD-10-CM | POA: Diagnosis not present

## 2014-06-06 NOTE — Progress Notes (Signed)
Remote ICD transmission.   

## 2014-06-10 LAB — CUP PACEART REMOTE DEVICE CHECK
Battery Voltage: 2.99 V
Brady Statistic AP VS Percent: 0.72 %
Brady Statistic AS VS Percent: 0.88 %
Brady Statistic RA Percent Paced: 72.15 %
Date Time Interrogation Session: 20160531052408
HIGH POWER IMPEDANCE MEASURED VALUE: 45 Ohm
HighPow Impedance: 54 Ohm
Lead Channel Impedance Value: 342 Ohm
Lead Channel Impedance Value: 418 Ohm
Lead Channel Impedance Value: 665 Ohm
Lead Channel Pacing Threshold Amplitude: 1 V
Lead Channel Pacing Threshold Pulse Width: 0.4 ms
Lead Channel Pacing Threshold Pulse Width: 0.4 ms
Lead Channel Pacing Threshold Pulse Width: 0.4 ms
Lead Channel Sensing Intrinsic Amplitude: 0.375 mV
Lead Channel Sensing Intrinsic Amplitude: 0.375 mV
Lead Channel Sensing Intrinsic Amplitude: 23 mV
Lead Channel Setting Pacing Amplitude: 2 V
Lead Channel Setting Pacing Amplitude: 2.5 V
Lead Channel Setting Pacing Pulse Width: 0.4 ms
Lead Channel Setting Pacing Pulse Width: 0.4 ms
Lead Channel Setting Sensing Sensitivity: 0.6 mV
MDC IDC MSMT BATTERY REMAINING LONGEVITY: 75 mo
MDC IDC MSMT LEADCHNL LV IMPEDANCE VALUE: 456 Ohm
MDC IDC MSMT LEADCHNL RA IMPEDANCE VALUE: 418 Ohm
MDC IDC MSMT LEADCHNL RA PACING THRESHOLD AMPLITUDE: 1 V
MDC IDC MSMT LEADCHNL RV IMPEDANCE VALUE: 342 Ohm
MDC IDC MSMT LEADCHNL RV PACING THRESHOLD AMPLITUDE: 0.75 V
MDC IDC MSMT LEADCHNL RV SENSING INTR AMPL: 23 mV
MDC IDC SET LEADCHNL LV PACING AMPLITUDE: 2 V
MDC IDC SET ZONE DETECTION INTERVAL: 360 ms
MDC IDC SET ZONE DETECTION INTERVAL: 430 ms
MDC IDC STAT BRADY AP VP PERCENT: 71.43 %
MDC IDC STAT BRADY AS VP PERCENT: 26.97 %
MDC IDC STAT BRADY RV PERCENT PACED: 95.44 %
Zone Setting Detection Interval: 320 ms
Zone Setting Detection Interval: 400 ms

## 2014-06-16 ENCOUNTER — Encounter: Payer: Self-pay | Admitting: Cardiology

## 2014-06-21 ENCOUNTER — Encounter (HOSPITAL_COMMUNITY): Payer: Self-pay

## 2014-06-21 ENCOUNTER — Ambulatory Visit (HOSPITAL_COMMUNITY)
Admission: RE | Admit: 2014-06-21 | Discharge: 2014-06-21 | Disposition: A | Discharge status: Home / Self Care | Payer: Medicare Other | Source: Ambulatory Visit | Attending: Internal Medicine | Admitting: Internal Medicine

## 2014-06-21 VITALS — BP 104/64 | HR 81 | Wt 199.0 lb

## 2014-06-21 DIAGNOSIS — I5022 Chronic systolic (congestive) heart failure: Secondary | ICD-10-CM

## 2014-06-21 DIAGNOSIS — I480 Paroxysmal atrial fibrillation: Secondary | ICD-10-CM

## 2014-06-21 LAB — BASIC METABOLIC PANEL
Anion gap: 9 (ref 5–15)
BUN: 14 mg/dL (ref 6–20)
CHLORIDE: 102 mmol/L (ref 101–111)
CO2: 28 mmol/L (ref 22–32)
Calcium: 8.8 mg/dL — ABNORMAL LOW (ref 8.9–10.3)
Creatinine, Ser: 0.9 mg/dL (ref 0.61–1.24)
GFR calc Af Amer: >60 mL/min (ref >60)
Glucose, Bld: 86 mg/dL (ref 65–99)
Potassium: 4.3 mmol/L (ref 3.5–5.1)
Sodium: 139 mmol/L (ref 135–145)

## 2014-06-21 MED ORDER — POTASSIUM CHLORIDE CRYS ER 20 MEQ PO TBCR: 20.0000 meq | EXTENDED_RELEASE_TABLET | Freq: Every day | ORAL | Status: DC

## 2014-06-21 MED ORDER — METOLAZONE 2.5 MG PO TABS: 2.5000 mg | ORAL_TABLET | Freq: Every day | ORAL | Status: DC

## 2014-06-21 NOTE — Progress Notes (Signed)
Patient ID: Jon Butler, male   DOB: 04-21-1935, 79 y.o.   MRN: [id number] PCP: Dr Lysle Rubens EP: [facility] HPI: Jon Butler is a 79 yo male with a history of chronic systolic HF EF 70% due to NICM s/p CRT-D (Medtronic), mechanical AVR (St Jude), LBBB, PAF S/P DC-CV 12/20/12, ETOH abuse and dementia and non-compliance.   Admitted to [facility] 10/10 through 10/26/12 with ADHF in setting of recurrent AF. Developed cardiogenic shock and VDRF   BB and ACE-I held and amiodarone gtt was started. Required short term Milrinone and lasix.Diuresed 26 pounds and transitioned to 40 mg daily. DC-CV on 10/20. Remains on Coumadin. D/C weight 206 pounds.   S/P successful DC-CV 12/20/12. Amio increased to 400 bid.   Recently saw Dr. Sherron Flemings in Neurology for progressive cognitive decline in setting of ETOH abuse. Also had foot drop.   Returns for HF f/u. Last visit he was instructed to take metolazone for 3 days. Overall feeling pretty good. Denies SOB/PND/Orthopnea. His wife says he is SOB with exertion. Weight at home 198 pounds. Taking all medications. Drinking 2 beers a day. [facility] at [facility]. No bleeding currently.   11/01/12 Labs INR > 8 10/26/12 K 3.0 Creatinine 1.23  12/16/12 K 4.6 Creatinine 1.05 12/20/12 K 4.0 Creatinine 1.04 11/15      K 4.4 Creatinine 1.1 05/26/2014: K 4.3 Creatinine 0.94   ICD interrogation : Fluid up and down Not over threshold since May 22 No AFib/VT  Activity ~ 1.5 hours per day.   ROS: All systems negative except as listed in HPI, PMH and Problem List.  Past Medical History  Diagnosis Date  . Left bundle-branch block   . Cardiomyopathy   . Chronic systolic heart failure     Current Outpatient Prescriptions  Medication Sig Dispense Refill  . allopurinol (ZYLOPRIM) 300 MG tablet Take 200 mg by mouth daily.     Marland Kitchen amiodarone (PACERONE) 200 MG tablet Take 1 tablet by mouth 2 (two) times daily.    Marland Kitchen amoxicillin (AMOXIL) 500 MG capsule Take 500 mg by mouth  every other day.     Marland Kitchen aspirin EC 81 MG tablet Take 1 tablet (81 mg total) by mouth daily.    . carvedilol (COREG) 3.125 MG tablet Take 2 tablets (6.25 mg total) by mouth 2 (two) times daily with a meal. 120 tablet 1  . Coenzyme Q10 (CO Q-10 PO) Take 1 tablet by mouth daily.     . colchicine 0.6 MG tablet Take 0.6 mg by mouth daily as needed (gout pain).     Marland Kitchen donepezil (ARICEPT) 23 MG TABS tablet Take 1 tablet (23 mg total) by mouth at bedtime. 30 tablet 6  . fluticasone (FLONASE) 50 MCG/ACT nasal spray Place 2 sprays into both nostrils daily as needed for allergies or rhinitis.     . furosemide (LASIX) 40 MG tablet Take 1 tablet (40 mg total) by mouth daily. 30 tablet   . lisinopril (PRINIVIL,ZESTRIL) 10 MG tablet Take 0.5 tablets (5 mg total) by mouth at bedtime. 30 tablet 4  . memantine (NAMENDA XR) 28 MG CP24 24 hr capsule Take 1 capsule (28 mg total) by mouth daily. 30 capsule 6  . Multiple Vitamins-Minerals (MULTIVITAMIN WITH MINERALS) tablet Take 1 tablet by mouth daily.     . rosuvastatin (CRESTOR) 10 MG tablet Take 10 mg by mouth daily.      Marland Kitchen spironolactone (ALDACTONE) 25 MG tablet Take 0.5 tablets (12.5 mg total) by mouth daily.  30 tablet 3  . thiamine (VITAMIN B-1) 100 MG tablet Take 1 tablet (100 mg total) by mouth 2 (two) times daily. 60 tablet 6  . traZODone (DESYREL) 50 MG tablet Take 150 mg by mouth at bedtime.     Marland Kitchen warfarin (COUMADIN) 2 MG tablet Take as directed by coumadin clinic 90 tablet 1  . metolazone (ZAROXOLYN) 2.5 MG tablet Take 1 tablet (2.5 mg total) by mouth as directed. (Patient not taking: Reported on 06/21/2014) 10 tablet 3  . potassium chloride SA (K-DUR,KLOR-CON) 20 MEQ tablet Take 1 tablet (20 mEq total) by mouth as directed. Take 1 tab when you take Metolazone (Patient not taking: Reported on 06/21/2014) 10 tablet 3   No current facility-administered medications for this encounter.   Filed Vitals:   06/21/14 1410  BP: 104/64  Pulse: 81  Weight: 199 lb  (90.266 kg)  SpO2: 98%   PHYSICAL EXAM: General:  Elderly. No resp difficulty Wife present HEENT: normal Neck: supple. JVP 8-9. Carotids 2+ bilaterally; no bruits. No lymphadenopathy or thryomegaly appreciated. Cor: PMI normal. Regular rate & rhythm. Mechanical s2 No rubs, gallops 2/6 TR. Lungs: clear Abdomen: soft, nontender, nondistended. No hepatosplenomegaly. No bruits or masses. Good bowel sounds. Extremities: no cyanosis, clubbing, rash, 1-2+  edema Neuro: alert & orientedx3, cranial nerves grossly intact. Moves all 4 extremities w/o difficulty. Affect pleasant.  ASSESSMENT & PLAN: 1. Chronic Systolic Heart Failure EF 20%  S/P Medtronic CRT-D 09/2012  - NYHA II-III symptoms. Volume status up and down a bit   Would like to keep his weight at home 194-197 pounds. - Continue lasix 40 daily. Add metolazone 2.5 weekly with kcl 20.  Check BMET now and 2 weeks. .    - Continue current meds. Titration limited by hypotension.  - Reinforced the need and ifmportance of daily weights, a low sodium diet, and fluid restriction (less than 2 L a day). Instructed to call the HF clinic if weight increases more than 3 lbs overnight or 5 lbs in a week.   2. A Fib-  S/P DC-CV 12/20/12. Maintaining NSR on ICD on ICD interrogation. Continue amiodarone. On coumadin No bleeding problems. LFTs ok 3/16. Needs TSH at next visit.  3. CKD stage 3- stable will continue to follow 4. Alcohol- Reinforced alcohol abstinence especially with coumadin.  5. Memory loss-  Neurology following. On namenda and aricept. Needs to stop drinking ETOH.  6. Mechanical AVR - on coumadin. SBE prophylaxis for all procedures.   Follow up 2 months  CLEGG,AMY, MD  2:25 PM  Patient seen and examined with Darrick Grinder, NP. We discussed all aspects of the encounter. I agree with the assessment and plan as stated above. Volume status up and down but overall not too bad. Agree with weekly metolazone. ICD interrogation reviewed personally with  him and his wife.   Elysa Womac,MD 8:55 PM

## 2014-06-21 NOTE — Patient Instructions (Addendum)
TAKE Metolazone 2.5 mg (1 Tablet) daily.  TAKE Potassium 20 meq (1 Tablet) daily.  Labs today (bmet)  FOLLOW UP in 6 weeks. `  \

## 2014-06-29 ENCOUNTER — Ambulatory Visit (INDEPENDENT_AMBULATORY_CARE_PROVIDER_SITE_OTHER): Payer: Medicare Other

## 2014-06-29 DIAGNOSIS — Z5181 Encounter for therapeutic drug level monitoring: Secondary | ICD-10-CM

## 2014-06-29 DIAGNOSIS — I4891 Unspecified atrial fibrillation: Secondary | ICD-10-CM

## 2014-06-29 DIAGNOSIS — Z95810 Presence of automatic (implantable) cardiac defibrillator: Secondary | ICD-10-CM

## 2014-06-29 DIAGNOSIS — Z7901 Long term (current) use of anticoagulants: Secondary | ICD-10-CM | POA: Diagnosis not present

## 2014-06-29 DIAGNOSIS — I480 Paroxysmal atrial fibrillation: Secondary | ICD-10-CM

## 2014-06-29 LAB — POCT INR: INR: 1.9

## 2014-07-07 ENCOUNTER — Other Ambulatory Visit: Payer: Self-pay | Admitting: *Deleted

## 2014-07-07 MED ORDER — LISINOPRIL 10 MG PO TABS: 5.0000 mg | ORAL_TABLET | Freq: Every day | ORAL | Status: DC

## 2014-08-02 ENCOUNTER — Encounter (HOSPITAL_COMMUNITY): Payer: Self-pay

## 2014-08-02 ENCOUNTER — Ambulatory Visit (HOSPITAL_COMMUNITY)
Admission: RE | Admit: 2014-08-02 | Discharge: 2014-08-02 | Disposition: A | Discharge status: Home / Self Care | Payer: Medicare Other | Source: Ambulatory Visit | Attending: Internal Medicine | Admitting: Internal Medicine

## 2014-08-02 ENCOUNTER — Ambulatory Visit (INDEPENDENT_AMBULATORY_CARE_PROVIDER_SITE_OTHER): Payer: Medicare Other | Admitting: Cardiology

## 2014-08-02 VITALS — BP 101/71 | HR 88 | Resp 18 | Wt 201.0 lb

## 2014-08-02 DIAGNOSIS — I4891 Unspecified atrial fibrillation: Secondary | ICD-10-CM | POA: Insufficient documentation

## 2014-08-02 DIAGNOSIS — Z79899 Other long term (current) drug therapy: Secondary | ICD-10-CM | POA: Diagnosis not present

## 2014-08-02 DIAGNOSIS — R413 Other amnesia: Secondary | ICD-10-CM | POA: Insufficient documentation

## 2014-08-02 DIAGNOSIS — Z7901 Long term (current) use of anticoagulants: Secondary | ICD-10-CM | POA: Diagnosis not present

## 2014-08-02 DIAGNOSIS — Z952 Presence of prosthetic heart valve: Secondary | ICD-10-CM | POA: Insufficient documentation

## 2014-08-02 DIAGNOSIS — Z95810 Presence of automatic (implantable) cardiac defibrillator: Secondary | ICD-10-CM | POA: Insufficient documentation

## 2014-08-02 DIAGNOSIS — Z5181 Encounter for therapeutic drug level monitoring: Secondary | ICD-10-CM

## 2014-08-02 DIAGNOSIS — I5022 Chronic systolic (congestive) heart failure: Secondary | ICD-10-CM | POA: Insufficient documentation

## 2014-08-02 DIAGNOSIS — N183 Chronic kidney disease, stage 3 (moderate): Secondary | ICD-10-CM | POA: Insufficient documentation

## 2014-08-02 DIAGNOSIS — Z7982 Long term (current) use of aspirin: Secondary | ICD-10-CM | POA: Insufficient documentation

## 2014-08-02 DIAGNOSIS — I480 Paroxysmal atrial fibrillation: Secondary | ICD-10-CM

## 2014-08-02 LAB — BASIC METABOLIC PANEL
Anion gap: 9 (ref 5–15)
BUN: 16 mg/dL (ref 6–20)
CO2: 31 mmol/L (ref 22–32)
CREATININE: 1.12 mg/dL (ref 0.61–1.24)
Calcium: 8.4 mg/dL — ABNORMAL LOW (ref 8.9–10.3)
Chloride: 95 mmol/L — ABNORMAL LOW (ref 101–111)
GFR calc Af Amer: >60 mL/min (ref >60)
GFR calc non Af Amer: >60 mL/min (ref >60)
Glucose, Bld: 104 mg/dL — ABNORMAL HIGH (ref 65–99)
Potassium: 4 mmol/L (ref 3.5–5.1)
SODIUM: 135 mmol/L (ref 135–145)

## 2014-08-02 LAB — PROTIME-INR
INR: 1.63 — ABNORMAL HIGH (ref 0.00–1.49)
Prothrombin Time: 19.3 seconds — ABNORMAL HIGH (ref 11.6–15.2)

## 2014-08-02 MED ORDER — AMIODARONE HCL 400 MG PO TABS: 400.0000 mg | ORAL_TABLET | Freq: Two times a day (BID) | ORAL | Status: DC

## 2014-08-02 NOTE — Patient Instructions (Signed)
Routine lab work today. Will notify you of abnormal results, otherwise no news is good news!  Will schedule you for cardioversion if INR comes back therapeutic. Will contact you for further details.  Follow up in 2 weeks.  Do the following things EVERYDAY: 1) Weigh yourself in the morning before breakfast. Write it down and keep it in a log. 2) Take your medicines as prescribed 3) Eat low salt foods-Limit salt (sodium) to 2000 mg per day.  4) Stay as active as you can everyday 5) Limit all fluids for the day to less than 2 liters

## 2014-08-02 NOTE — Progress Notes (Signed)
Patient ID: Jon Butler, male   DOB: 1936/01/03, 79 y.o.   MRN: [id number] PCP: Dr Lysle Rubens EP: Dr [facility] HF: [place] Moring  HPI: Jon Butler is a 79 yo male with a history of chronic systolic HF EF 56% due to NICM s/p CRT-D (Medtronic), mechanical AVR (St Jude), LBBB, PAF S/P DC-CV 12/20/12, ETOH abuse and dementia and non-compliance.   Admitted to [facility] 10/10 through 10/26/12 with ADHF in setting of recurrent AF. Developed cardiogenic shock and VDRF   BB and ACE-I held and amiodarone gtt was started. Required short term Milrinone and lasix.Diuresed 26 pounds and transitioned to 40 mg daily. DC-CV on 10/20. Remains on Coumadin. D/C weight 206 pounds.   S/P successful DC-CV 12/20/12. Amio increased to 400 bid.   Has seen Dr. Sherron Flemings in Neurology for progressive cognitive decline in setting of ETOH abuse. Also had foot drop.   Returns for HF f/u. Last visit started on metolazone. He was instructed to take metolazone for 3 days. Overall feeling complaining of fatigue.  Denies SOB/PND/Orthopnea. SOB with steps.  Has not been weighing at home.  Taking all medications. Drinking 2 beers a day. Coumadin followed at [facility]. No bleeding currently.   11/01/12 Labs INR > 8 10/26/12 K 3.0 Creatinine 1.23  12/16/12 K 4.6 Creatinine 1.05 12/20/12 K 4.0 Creatinine 1.04 11/15      K 4.4 Creatinine 1.1 05/26/2014: K 4.3 Creatinine 0.94  06/29/14: INR 1.9   ICD interrogation : Trending up. Back in A fib.    ROS: All systems negative except as listed in HPI, PMH and Problem List.  Past Medical History  Diagnosis Date  . Left bundle-branch block   . Cardiomyopathy   . Chronic systolic heart failure     Current Outpatient Prescriptions  Medication Sig Dispense Refill  . allopurinol (ZYLOPRIM) 300 MG tablet Take 200 mg by mouth daily.     Marland Kitchen amiodarone (PACERONE) 400 MG tablet Take 1 tablet (400 mg total) by mouth 2 (two) times daily. 60 tablet 6  . amoxicillin (AMOXIL) 500 MG capsule Take  500 mg by mouth every other day.     Marland Kitchen aspirin EC 81 MG tablet Take 1 tablet (81 mg total) by mouth daily.    . carvedilol (COREG) 3.125 MG tablet Take 2 tablets (6.25 mg total) by mouth 2 (two) times daily with a meal. 120 tablet 1  . Coenzyme Q10 (CO Q-10 PO) Take 1 tablet by mouth daily.     . colchicine 0.6 MG tablet Take 0.6 mg by mouth daily as needed (gout pain).     Marland Kitchen donepezil (ARICEPT) 23 MG TABS tablet Take 1 tablet (23 mg total) by mouth at bedtime. 30 tablet 6  . fluticasone (FLONASE) 50 MCG/ACT nasal spray Place 2 sprays into both nostrils daily as needed for allergies or rhinitis.     . furosemide (LASIX) 40 MG tablet Take 1 tablet (40 mg total) by mouth daily. 30 tablet   . lisinopril (PRINIVIL,ZESTRIL) 10 MG tablet Take 0.5 tablets (5 mg total) by mouth at bedtime. 30 tablet 4  . memantine (NAMENDA XR) 28 MG CP24 24 hr capsule Take 1 capsule (28 mg total) by mouth daily. 30 capsule 6  . metolazone (ZAROXOLYN) 2.5 MG tablet Take 1 tablet (2.5 mg total) by mouth daily. 10 tablet 3  . Multiple Vitamins-Minerals (MULTIVITAMIN WITH MINERALS) tablet Take 1 tablet by mouth daily.     . potassium chloride SA (K-DUR,KLOR-CON) 20 MEQ tablet Take 1 tablet (  20 mEq total) by mouth daily. Take 1 tab when you take Metolazone 10 tablet 3  . rosuvastatin (CRESTOR) 10 MG tablet Take 10 mg by mouth daily.      Marland Kitchen spironolactone (ALDACTONE) 25 MG tablet Take 0.5 tablets (12.5 mg total) by mouth daily. 30 tablet 3  . thiamine (VITAMIN B-1) 100 MG tablet Take 1 tablet (100 mg total) by mouth 2 (two) times daily. 60 tablet 6  . traZODone (DESYREL) 50 MG tablet Take 300 mg by mouth at bedtime.     Marland Kitchen warfarin (COUMADIN) 2 MG tablet Take as directed by coumadin clinic 90 tablet 1   No current facility-administered medications for this encounter.   Filed Vitals:   08/02/14 1426  BP: 101/71  Pulse: 88  Resp: 18  Weight: 201 lb (91.173 kg)  SpO2: 96%   PHYSICAL EXAM: General:  Elderly. No resp  difficulty Wife present HEENT: normal Neck: supple. JVP to jaw.  Carotids 2+ bilaterally; no bruits. No lymphadenopathy or thryomegaly appreciated. Cor: PMI normal. Regular rate & rhythm. Mechanical s2 No rubs, gallops 2/6 TR. Lungs: clear Abdomen: soft, nontender, nondistended. No hepatosplenomegaly. No bruits or masses. Good bowel sounds. Extremities: no cyanosis, clubbing, rash, 1-2+  edema Neuro: alert & orientedx3, cranial nerves grossly intact. Moves all 4 extremities w/o difficulty. Affect pleasant.  ASSESSMENT & PLAN: 1. Chronic Systolic Heart Failure EF 20%  S/P Medtronic CRT-D 09/2012  Optivol- Volume trending up to threshold. AF present for the last few weeks  - NYHA II-III symptoms.  Volume status trending up likely due to A fib. AF playing a role in that.   - Continue lasix 40 daily. Continue metolazone 2.5 weekly with kcl 20.  -Continue current meds. Titration limited by hypotension.  - Reinforced the need and ifmportance of daily weights, a low sodium diet, and fluid restriction (less than 2 L a day). Instructed to call the HF clinic if weight increases more than 3 lbs overnight or 5 lbs in a week.   2. A Fib-  S/P DC-CV 12/20/12.  Today back in A fib on medtronic interrogation. INR 1.9 Check INR now. IF greater 2.0 greater will set up TEE/DC-Cv. .  3. CKD stage 3- Check BMET today.  4. Alcohol- Reinforced alcohol abstinence especially with coumadin.  5. Memory loss-  Neurology following. On namenda and aricept. Needs to stop drinking ETOH.  6. Mechanical AVR - on coumadin. SBE prophylaxis for all procedures.   Follow up 2 weeks  CLEGG,AMY, NP-C  3:14 PM  Patient seen and examined with Darrick Grinder, NP. We discussed all aspects of the encounter. I agree with the assessment and plan as stated above.   ICD interrogated personally. He is back in AF with volume up. Last INR 1.9. Will check INR today. If 2.0 or greater will plan TEE and DC-CV. If < 2.0 will await therapeutic INR  and then schedule for TEE/DC-CV. Increase amio to 400 bid. Can take metolazone prn for progressive volume overload.   Mataya Kilduff,MD 3:51 PM

## 2014-08-07 ENCOUNTER — Ambulatory Visit (INDEPENDENT_AMBULATORY_CARE_PROVIDER_SITE_OTHER): Payer: Medicare Other | Admitting: Pharmacist

## 2014-08-07 ENCOUNTER — Other Ambulatory Visit (HOSPITAL_COMMUNITY): Payer: Self-pay

## 2014-08-07 ENCOUNTER — Encounter (HOSPITAL_COMMUNITY): Payer: Self-pay | Admitting: Cardiology

## 2014-08-07 DIAGNOSIS — Z95810 Presence of automatic (implantable) cardiac defibrillator: Secondary | ICD-10-CM | POA: Diagnosis not present

## 2014-08-07 DIAGNOSIS — I4891 Unspecified atrial fibrillation: Secondary | ICD-10-CM

## 2014-08-07 DIAGNOSIS — Z5181 Encounter for therapeutic drug level monitoring: Secondary | ICD-10-CM | POA: Diagnosis not present

## 2014-08-07 DIAGNOSIS — I480 Paroxysmal atrial fibrillation: Secondary | ICD-10-CM

## 2014-08-07 DIAGNOSIS — I5022 Chronic systolic (congestive) heart failure: Secondary | ICD-10-CM

## 2014-08-07 DIAGNOSIS — J189 Pneumonia, unspecified organism: Secondary | ICD-10-CM

## 2014-08-07 DIAGNOSIS — Z7901 Long term (current) use of anticoagulants: Secondary | ICD-10-CM

## 2014-08-07 HISTORY — DX: Pneumonia, unspecified organism: J18.9

## 2014-08-07 LAB — POCT INR: INR: 2.8

## 2014-08-08 ENCOUNTER — Inpatient Hospital Stay (HOSPITAL_COMMUNITY)
Admission: EM | Admit: 2014-08-08 | Discharge: 2014-08-21 | DRG: 871 | Disposition: A | Discharge status: Home Health | Payer: Medicare Other | Attending: Internal Medicine | Admitting: Internal Medicine

## 2014-08-08 ENCOUNTER — Emergency Department (HOSPITAL_COMMUNITY): Payer: Medicare Other

## 2014-08-08 ENCOUNTER — Encounter (HOSPITAL_COMMUNITY): Payer: Self-pay | Admitting: *Deleted

## 2014-08-08 DIAGNOSIS — N179 Acute kidney failure, unspecified: Secondary | ICD-10-CM | POA: Diagnosis not present

## 2014-08-08 DIAGNOSIS — D696 Thrombocytopenia, unspecified: Secondary | ICD-10-CM | POA: Diagnosis present

## 2014-08-08 DIAGNOSIS — R933 Abnormal findings on diagnostic imaging of other parts of digestive tract: Secondary | ICD-10-CM | POA: Diagnosis not present

## 2014-08-08 DIAGNOSIS — R4189 Other symptoms and signs involving cognitive functions and awareness: Secondary | ICD-10-CM | POA: Diagnosis present

## 2014-08-08 DIAGNOSIS — I4891 Unspecified atrial fibrillation: Secondary | ICD-10-CM | POA: Diagnosis present

## 2014-08-08 DIAGNOSIS — K222 Esophageal obstruction: Secondary | ICD-10-CM | POA: Diagnosis present

## 2014-08-08 DIAGNOSIS — J189 Pneumonia, unspecified organism: Secondary | ICD-10-CM | POA: Diagnosis not present

## 2014-08-08 DIAGNOSIS — I482 Chronic atrial fibrillation: Secondary | ICD-10-CM | POA: Diagnosis present

## 2014-08-08 DIAGNOSIS — K220 Achalasia of cardia: Secondary | ICD-10-CM | POA: Diagnosis not present

## 2014-08-08 DIAGNOSIS — I480 Paroxysmal atrial fibrillation: Secondary | ICD-10-CM | POA: Diagnosis not present

## 2014-08-08 DIAGNOSIS — F1010 Alcohol abuse, uncomplicated: Secondary | ICD-10-CM | POA: Diagnosis present

## 2014-08-08 DIAGNOSIS — Z95810 Presence of automatic (implantable) cardiac defibrillator: Secondary | ICD-10-CM

## 2014-08-08 DIAGNOSIS — I509 Heart failure, unspecified: Secondary | ICD-10-CM

## 2014-08-08 DIAGNOSIS — I255 Ischemic cardiomyopathy: Secondary | ICD-10-CM | POA: Diagnosis present

## 2014-08-08 DIAGNOSIS — I429 Cardiomyopathy, unspecified: Secondary | ICD-10-CM | POA: Diagnosis present

## 2014-08-08 DIAGNOSIS — Z7982 Long term (current) use of aspirin: Secondary | ICD-10-CM | POA: Diagnosis not present

## 2014-08-08 DIAGNOSIS — I5023 Acute on chronic systolic (congestive) heart failure: Secondary | ICD-10-CM | POA: Diagnosis present

## 2014-08-08 DIAGNOSIS — R918 Other nonspecific abnormal finding of lung field: Secondary | ICD-10-CM | POA: Diagnosis not present

## 2014-08-08 DIAGNOSIS — R0602 Shortness of breath: Secondary | ICD-10-CM | POA: Diagnosis not present

## 2014-08-08 DIAGNOSIS — I959 Hypotension, unspecified: Secondary | ICD-10-CM | POA: Diagnosis present

## 2014-08-08 DIAGNOSIS — J9601 Acute respiratory failure with hypoxia: Secondary | ICD-10-CM | POA: Diagnosis present

## 2014-08-08 DIAGNOSIS — J9811 Atelectasis: Secondary | ICD-10-CM | POA: Diagnosis not present

## 2014-08-08 DIAGNOSIS — E876 Hypokalemia: Secondary | ICD-10-CM | POA: Diagnosis not present

## 2014-08-08 DIAGNOSIS — R1319 Other dysphagia: Secondary | ICD-10-CM | POA: Diagnosis present

## 2014-08-08 DIAGNOSIS — Z7901 Long term (current) use of anticoagulants: Secondary | ICD-10-CM | POA: Diagnosis not present

## 2014-08-08 DIAGNOSIS — Z7289 Other problems related to lifestyle: Secondary | ICD-10-CM | POA: Diagnosis present

## 2014-08-08 DIAGNOSIS — Z952 Presence of prosthetic heart valve: Secondary | ICD-10-CM | POA: Diagnosis not present

## 2014-08-08 DIAGNOSIS — Z789 Other specified health status: Secondary | ICD-10-CM | POA: Diagnosis present

## 2014-08-08 DIAGNOSIS — R1314 Dysphagia, pharyngoesophageal phase: Secondary | ICD-10-CM | POA: Diagnosis not present

## 2014-08-08 DIAGNOSIS — I5022 Chronic systolic (congestive) heart failure: Secondary | ICD-10-CM | POA: Diagnosis not present

## 2014-08-08 DIAGNOSIS — F1099 Alcohol use, unspecified with unspecified alcohol-induced disorder: Secondary | ICD-10-CM | POA: Diagnosis not present

## 2014-08-08 DIAGNOSIS — J810 Acute pulmonary edema: Secondary | ICD-10-CM | POA: Diagnosis not present

## 2014-08-08 DIAGNOSIS — Z515 Encounter for palliative care: Secondary | ICD-10-CM | POA: Diagnosis not present

## 2014-08-08 DIAGNOSIS — F0390 Unspecified dementia without behavioral disturbance: Secondary | ICD-10-CM | POA: Diagnosis present

## 2014-08-08 DIAGNOSIS — J90 Pleural effusion, not elsewhere classified: Secondary | ICD-10-CM

## 2014-08-08 DIAGNOSIS — I481 Persistent atrial fibrillation: Secondary | ICD-10-CM | POA: Diagnosis not present

## 2014-08-08 DIAGNOSIS — R0902 Hypoxemia: Secondary | ICD-10-CM | POA: Diagnosis present

## 2014-08-08 DIAGNOSIS — R1310 Dysphagia, unspecified: Secondary | ICD-10-CM | POA: Diagnosis present

## 2014-08-08 DIAGNOSIS — I4892 Unspecified atrial flutter: Secondary | ICD-10-CM | POA: Diagnosis present

## 2014-08-08 DIAGNOSIS — I447 Left bundle-branch block, unspecified: Secondary | ICD-10-CM | POA: Diagnosis present

## 2014-08-08 DIAGNOSIS — A419 Sepsis, unspecified organism: Principal | ICD-10-CM | POA: Diagnosis present

## 2014-08-08 HISTORY — DX: Unspecified atrial fibrillation: I48.91

## 2014-08-08 LAB — BASIC METABOLIC PANEL
ANION GAP: 10 (ref 5–15)
BUN: 21 mg/dL — ABNORMAL HIGH (ref 6–20)
CHLORIDE: 95 mmol/L — AB (ref 101–111)
CO2: 31 mmol/L (ref 22–32)
CREATININE: 1.28 mg/dL — AB (ref 0.61–1.24)
Calcium: 8.7 mg/dL — ABNORMAL LOW (ref 8.9–10.3)
GFR calc Af Amer: >60 mL/min (ref >60)
GFR calc non Af Amer: 53 mL/min — ABNORMAL LOW (ref >60)
GLUCOSE: 147 mg/dL — AB (ref 65–99)
Potassium: 3.9 mmol/L (ref 3.5–5.1)
Sodium: 136 mmol/L (ref 135–145)

## 2014-08-08 LAB — DIFFERENTIAL
BASOS ABS: 0 10*3/uL (ref 0.0–0.1)
BASOS PCT: 0 % (ref 0–1)
Eosinophils Absolute: 0 10*3/uL (ref 0.0–0.7)
Eosinophils Relative: 0 % (ref 0–5)
Lymphocytes Relative: 8 % — ABNORMAL LOW (ref 12–46)
Lymphs Abs: 1.1 10*3/uL (ref 0.7–4.0)
Monocytes Absolute: 0.9 10*3/uL (ref 0.1–1.0)
Monocytes Relative: 7 % (ref 3–12)
NEUTROS PCT: 85 % — AB (ref 43–77)
Neutro Abs: 11.7 10*3/uL — ABNORMAL HIGH (ref 1.7–7.7)

## 2014-08-08 LAB — CBC
HCT: 44.5 % (ref 39.0–52.0)
Hemoglobin: 14.7 g/dL (ref 13.0–17.0)
MCH: 31.3 pg (ref 26.0–34.0)
MCHC: 33 g/dL (ref 30.0–36.0)
MCV: 94.7 fL (ref 78.0–100.0)
PLATELETS: 123 10*3/uL — AB (ref 150–400)
RBC: 4.7 MIL/uL (ref 4.22–5.81)
RDW: 17.2 % — AB (ref 11.5–15.5)
WBC: 14.7 10*3/uL — ABNORMAL HIGH (ref 4.0–10.5)

## 2014-08-08 LAB — URINALYSIS, ROUTINE W REFLEX MICROSCOPIC
Bilirubin Urine: NEGATIVE
GLUCOSE, UA: NEGATIVE mg/dL
HGB URINE DIPSTICK: NEGATIVE
KETONES UR: 15 mg/dL — AB
Leukocytes, UA: NEGATIVE
NITRITE: NEGATIVE
Protein, ur: NEGATIVE mg/dL
Specific Gravity, Urine: 1.022 (ref 1.005–1.030)
Urobilinogen, UA: 1 mg/dL (ref 0.0–1.0)
pH: 5 (ref 5.0–8.0)

## 2014-08-08 LAB — LACTIC ACID, PLASMA: Lactic Acid, Venous: 1.3 mmol/L (ref 0.5–2.0)

## 2014-08-08 LAB — I-STAT CG4 LACTIC ACID, ED: LACTIC ACID, VENOUS: 1.08 mmol/L (ref 0.5–2.0)

## 2014-08-08 LAB — PROTIME-INR
INR: 2.86 — ABNORMAL HIGH (ref 0.00–1.49)
Prothrombin Time: 29.5 seconds — ABNORMAL HIGH (ref 11.6–15.2)

## 2014-08-08 LAB — I-STAT TROPONIN, ED: Troponin i, poc: 0.05 ng/mL (ref 0.00–0.08)

## 2014-08-08 MED ORDER — TRAZODONE HCL 100 MG PO TABS
400.0000 mg | ORAL_TABLET | Freq: Every day | ORAL | Status: DC
  Administered 2014-08-09 – 2014-08-20 (×11): 400 mg via ORAL
  Filled 2014-08-08: qty 1
  Filled 2014-08-08: qty 4
  Filled 2014-08-08 (×2): qty 1
  Filled 2014-08-08 (×2): qty 4
  Filled 2014-08-08 (×2): qty 1
  Filled 2014-08-08: qty 4
  Filled 2014-08-08 (×2): qty 1
  Filled 2014-08-08: qty 4
  Filled 2014-08-08: qty 1

## 2014-08-08 MED ORDER — ASPIRIN EC 81 MG PO TBEC
81.0000 mg | DELAYED_RELEASE_TABLET | Freq: Every day | ORAL | Status: DC
  Administered 2014-08-09 – 2014-08-16 (×4): 81 mg via ORAL
  Filled 2014-08-08 (×8): qty 1

## 2014-08-08 MED ORDER — AZITHROMYCIN 500 MG IV SOLR
500.0000 mg | INTRAVENOUS | Status: AC
  Administered 2014-08-09 – 2014-08-14 (×6): 500 mg via INTRAVENOUS
  Filled 2014-08-08 (×7): qty 500

## 2014-08-08 MED ORDER — WARFARIN SODIUM 3 MG PO TABS: 3.0000 mg | ORAL_TABLET | ORAL | Status: DC

## 2014-08-08 MED ORDER — ROSUVASTATIN CALCIUM 10 MG PO TABS
5.0000 mg | ORAL_TABLET | Freq: Every day | ORAL | Status: DC
  Administered 2014-08-09 – 2014-08-21 (×10): 5 mg via ORAL
  Filled 2014-08-08 (×13): qty 1

## 2014-08-08 MED ORDER — WARFARIN SODIUM 2 MG PO TABS
2.0000 mg | ORAL_TABLET | ORAL | Status: DC
  Filled 2014-08-08: qty 1

## 2014-08-08 MED ORDER — AZITHROMYCIN 500 MG IV SOLR
500.0000 mg | INTRAVENOUS | Status: DC
  Administered 2014-08-08: 500 mg via INTRAVENOUS
  Filled 2014-08-08: qty 500

## 2014-08-08 MED ORDER — DONEPEZIL HCL 23 MG PO TABS
23.0000 mg | ORAL_TABLET | Freq: Every day | ORAL | Status: DC
  Administered 2014-08-09 – 2014-08-20 (×9): 23 mg via ORAL
  Filled 2014-08-08 (×16): qty 1

## 2014-08-08 MED ORDER — VITAMIN B-1 100 MG PO TABS
100.0000 mg | ORAL_TABLET | Freq: Two times a day (BID) | ORAL | Status: DC
  Administered 2014-08-09 – 2014-08-21 (×19): 100 mg via ORAL
  Filled 2014-08-08 (×25): qty 1

## 2014-08-08 MED ORDER — WARFARIN - PHARMACIST DOSING INPATIENT: Freq: Every day | Status: DC

## 2014-08-08 MED ORDER — AMIODARONE HCL 200 MG PO TABS
400.0000 mg | ORAL_TABLET | Freq: Two times a day (BID) | ORAL | Status: DC
  Administered 2014-08-09 – 2014-08-10 (×4): 400 mg via ORAL
  Filled 2014-08-08 (×7): qty 2

## 2014-08-08 MED ORDER — FLUTICASONE PROPIONATE 50 MCG/ACT NA SUSP: 2.0000 | Freq: Every day | NASAL | Status: DC | PRN

## 2014-08-08 MED ORDER — DEXTROSE 5 % IV SOLN
1.0000 g | INTRAVENOUS | Status: DC
  Administered 2014-08-08: 1 g via INTRAVENOUS
  Filled 2014-08-08: qty 10

## 2014-08-08 MED ORDER — MEMANTINE HCL ER 28 MG PO CP24
28.0000 mg | ORAL_CAPSULE | Freq: Every day | ORAL | Status: DC
  Administered 2014-08-09 – 2014-08-21 (×9): 28 mg via ORAL
  Filled 2014-08-08 (×14): qty 1

## 2014-08-08 MED ORDER — DEXTROSE 5 % IV SOLN
1.0000 g | INTRAVENOUS | Status: AC
  Administered 2014-08-09 – 2014-08-14 (×6): 1 g via INTRAVENOUS
  Filled 2014-08-08 (×7): qty 10

## 2014-08-08 MED ORDER — ALLOPURINOL 300 MG PO TABS
300.0000 mg | ORAL_TABLET | Freq: Every day | ORAL | Status: DC
  Administered 2014-08-09 – 2014-08-21 (×9): 300 mg via ORAL
  Filled 2014-08-08 (×11): qty 1
  Filled 2014-08-08: qty 3
  Filled 2014-08-08: qty 1

## 2014-08-08 NOTE — ED Notes (Signed)
Per Dr. Posey Pronto pt will need a stepdown bed.

## 2014-08-08 NOTE — ED Notes (Signed)
Attempted to call report

## 2014-08-08 NOTE — Progress Notes (Addendum)
ANTICOAGULATION CONSULT NOTE - Initial Consult  Pharmacy Consult for Warfarin  Indication: atrial fibrillation/AVR  No Known Allergies  Patient Measurements: Height: 5\' 9"  (175.3 cm) Weight: 197 lb 12.8 oz (89.721 kg) IBW/kg (Calculated) : 70.7  Vital Signs: Temp: 99.9 F (37.7 C) (08/02 2047) Temp Source: Oral (08/02 2047) BP: 93/62 mmHg (08/02 2300) Pulse Rate: 73 (08/02 2300)  Labs:  Recent Labs  08/07/14 1423 08/08/14 1853  HGB  --  14.7  HCT  --  44.5  PLT  --  123*  LABPROT  --  29.5*  INR 2.8 2.86*  CREATININE  --  1.28*   Estimated Creatinine Clearance: 54.4 mL/min (by C-G formula based on Cr of 1.28).  Medical History: Past Medical History  Diagnosis Date  . Left bundle-branch block   . Cardiomyopathy   . Chronic systolic heart failure   . Atrial fibrillation     Assessment: 79 y/o M with shortness of breath, MD note states pt to have ablation for afib tomorrow, INR is therapeutic at 2.86 on admit, CBC good, mild bump in Scr, last dose warfarin 8/2, warfarin PTA dose is 3 mg on Tues/Sat and 2 mg all other days.   Goal of Therapy:  INR 2-3 Monitor platelets by anticoagulation protocol: Yes   Plan:  -Continue warfarin home dose as above -Daily PT/INR, adjust dose as needed -Monitor for bleeding  Narda Bonds 08/08/2014,11:28 PM  ADDENDUM:  INR taken this am has now trended up to 3.27. Will plan to continue home dose but give a lower dose tonight as a one time dose.  Plan: Give coumadin 1mg  PO x 1  Monitor daily INR, CBC, s/s of bleed

## 2014-08-08 NOTE — ED Provider Notes (Signed)
CSN: [id number]     Arrival date & time 08/08/14  1825 History   First MD Initiated Contact with Patient 08/08/14 2013     Chief Complaint  Patient presents with  . Shortness of Breath   HPI  Notes increase in shortness of breath, satting 89-91% on room air at presentation. Wife reports patient became pale, shaky, and very weak. Noted desaturation to 81% at home and HR of 100. Reports occasional cough, worse at night. Denies chest pain or shortness of breath. History of atrial fibrillation and scheduled to have ablation tomorrow morning. Also notes history of systolic HF. Orthopnea noted and usually sleeps in seated position. Wife reports up approximately 10 pounds from baseline. History from patient limited due to demented state.  Past Medical History  Diagnosis Date  . Left bundle-branch block   . Cardiomyopathy   . Chronic systolic heart failure   . Atrial fibrillation    Past Surgical History  Procedure Laterality Date  . Doppler echocardiography  2008, 2009  . Aortic valve replacement    . Tee without cardioversion N/A 10/15/2012    Procedure: TRANSESOPHAGEAL ECHOCARDIOGRAM (TEE);  Surgeon: Fay Records, MD;  Location: [facility] ENDOSCOPY;  Service: Cardiovascular;  Laterality: N/A;  . Cardioversion N/A 10/25/2012    Procedure: CARDIOVERSION;  Surgeon: Evans Lance, MD;  Location: [place];  Service: Cardiovascular;  Laterality: N/A;  . Cardioversion N/A 12/20/2012    Procedure: CARDIOVERSION;  Surgeon: Larey Dresser, MD;  Location: [place];  Service: Cardiovascular;  Laterality: N/A;  . Implantable cardioverter defibrillator generator change N/A 09/20/2012    Procedure: IMPLANTABLE CARDIOVERTER DEFIBRILLATOR GENERATOR CHANGE;  Surgeon: Evans Lance, MD;  Location: [facility] CATH LAB;  Service: Cardiovascular;  Laterality: N/A;   Family History  Problem Relation Age of Onset  . Aneurysm Mother     brain  . Heart attack Father 17   History  Substance Use Topics  . Smoking status:  Never Smoker   . Smokeless tobacco: Never Used  . Alcohol Use: 0.0 oz/week    2-3 Glasses of wine per week     Comment: 2-3 glasses    Review of Systems  Constitutional: Negative for fever.  Respiratory: Negative for shortness of breath.   Cardiovascular: Positive for leg swelling. Negative for chest pain.  Gastrointestinal: Negative for nausea, vomiting, diarrhea and constipation.  Musculoskeletal: Negative for back pain.  Neurological: Positive for weakness.      Allergies  Review of patient's allergies indicates no known allergies.  Home Medications   Prior to Admission medications   Medication Sig Start Date End Date Taking? Authorizing Provider  allopurinol (ZYLOPRIM) 300 MG tablet Take 200 mg by mouth daily.     Historical Provider, MD  amiodarone (PACERONE) 400 MG tablet Take 1 tablet (400 mg total) by mouth 2 (two) times daily. 08/02/14   Jolaine Artist, MD  amoxicillin (AMOXIL) 500 MG capsule Take 500 mg by mouth every other day.     Historical Provider, MD  aspirin EC 81 MG tablet Take 1 tablet (81 mg total) by mouth daily. 10/26/12   Brooke O Edmisten, PA-C  carvedilol (COREG) 3.125 MG tablet Take 2 tablets (6.25 mg total) by mouth 2 (two) times daily with a meal. 09/08/13   Evans Lance, MD  Coenzyme Q10 (CO Q-10 PO) Take 1 tablet by mouth daily.     Historical Provider, MD  colchicine 0.6 MG tablet Take 0.6 mg by mouth daily as needed (gout pain).  Historical Provider, MD  donepezil (ARICEPT) 23 MG TABS tablet Take 1 tablet (23 mg total) by mouth at bedtime. 04/20/14   Penni Bombard, MD  fluticasone (FLONASE) 50 MCG/ACT nasal spray Place 2 sprays into both nostrils daily as needed for allergies or rhinitis.  08/17/13   Historical Provider, MD  furosemide (LASIX) 40 MG tablet Take 1 tablet (40 mg total) by mouth daily. 05/24/14   Jolaine Artist, MD  lisinopril (PRINIVIL,ZESTRIL) 10 MG tablet Take 0.5 tablets (5 mg total) by mouth at bedtime. 07/07/14   Evans Lance, MD  memantine (NAMENDA XR) 28 MG CP24 24 hr capsule Take 1 capsule (28 mg total) by mouth daily. 04/20/14   Penni Bombard, MD  metolazone (ZAROXOLYN) 2.5 MG tablet Take 1 tablet (2.5 mg total) by mouth daily. 06/21/14   Larey Dresser, MD  Multiple Vitamins-Minerals (MULTIVITAMIN WITH MINERALS) tablet Take 1 tablet by mouth daily.     Historical Provider, MD  potassium chloride SA (K-DUR,KLOR-CON) 20 MEQ tablet Take 1 tablet (20 mEq total) by mouth daily. Take 1 tab when you take Metolazone 06/21/14   Larey Dresser, MD  rosuvastatin (CRESTOR) 10 MG tablet Take 10 mg by mouth daily.      Historical Provider, MD  spironolactone (ALDACTONE) 25 MG tablet Take 0.5 tablets (12.5 mg total) by mouth daily. 10/27/12   Evans Lance, MD  thiamine (VITAMIN B-1) 100 MG tablet Take 1 tablet (100 mg total) by mouth 2 (two) times daily. 12/16/12   Amy D Ninfa Meeker, NP  traZODone (DESYREL) 50 MG tablet Take 300 mg by mouth at bedtime.     Historical Provider, MD  warfarin (COUMADIN) 2 MG tablet Take as directed by coumadin clinic 01/05/14   Evans Lance, MD   BP 95/67 mmHg  Pulse 73  Temp(Src) 99.9 F (37.7 C) (Oral)  Resp 18  Ht 5\' 9"  (1.753 m)  Wt 197 lb 12.8 oz (89.721 kg)  BMI 29.20 kg/m2  SpO2 97% Physical Exam  Constitutional: He appears well-developed and well-nourished. No distress.  HENT:  Head: Normocephalic and atraumatic.  Eyes: Pupils are equal, round, and reactive to light. Right eye exhibits no discharge. Left eye exhibits no discharge.  Cardiovascular: Normal rate and regular rhythm.  Exam reveals no gallop and no friction rub.   No murmur heard. Pulmonary/Chest: Effort normal. No respiratory distress. He has no wheezes.  Crackles noted bilaterally, decreased breath sounds in right base  Abdominal: Soft. He exhibits no distension. There is no tenderness. There is no rebound.  Musculoskeletal:  2+ pitting edema    ED Course  Procedures (including critical care time) Labs  Review Labs Reviewed  BASIC METABOLIC PANEL - Abnormal; Notable for the following:    Chloride 95 (*)    Glucose, Bld 147 (*)    BUN 21 (*)    Creatinine, Ser 1.28 (*)    Calcium 8.7 (*)    GFR calc non Af Amer 53 (*)    All other components within normal limits  CBC - Abnormal; Notable for the following:    WBC 14.7 (*)    RDW 17.2 (*)    Platelets 123 (*)    All other components within normal limits  PROTIME-INR - Abnormal; Notable for the following:    Prothrombin Time 29.5 (*)    INR 2.86 (*)    All other components within normal limits  CULTURE, BLOOD (ROUTINE X 2)  CULTURE, BLOOD (ROUTINE X 2)  LACTIC ACID, PLASMA  LACTIC ACID, PLASMA  I-STAT TROPOININ, ED  I-STAT CG4 LACTIC ACID, ED    Imaging Review Dg Chest 2 View  08/08/2014   CLINICAL DATA:  Acute onset of shortness of breath and decreased O2 saturation. Initial encounter.  EXAM: CHEST  2 VIEW  COMPARISON:  Chest radiograph performed 03/11/2013  FINDINGS: Diffuse bibasilar airspace opacification, right greater than left, raises concern for multifocal pneumonia. Small bilateral pleural effusions are suspected. No pneumothorax is seen.  The heart is borderline enlarged. The patient is status post median sternotomy. A pacemaker/AICD is noted at the left chest wall, with leads ending at the right atrium, right ventricle and coronary sinus. No acute osseous abnormalities are seen.  IMPRESSION: Diffuse bibasilar airspace opacification, right greater than left, raises concern for multifocal pneumonia. Small bilateral pleural effusions suspected. Borderline cardiomegaly.   Electronically Signed   By: Garald Balding M.D.   On: 08/08/2014 19:19     EKG Interpretation   Date/Time:  Tuesday August 08 2014 18:43:06 EDT Ventricular Rate:  84 PR Interval:    QRS Duration: 178 QT Interval:  468 QTC Calculation: 553 R Axis:   148 Text Interpretation:  Ventricular-paced rhythm with occasional  supraventricular complexes Abnormal  ECG Confirmed by BEATON  MD, [place]  ([id number]) on 08/08/2014 8:52:25 PM      MDM   Final diagnoses:  Community acquired pneumonia  - BMP with elevated creatinine 1.28.  - Troponin negative - CBC with leukocytosis (14.7) and thrombocytopenia (123) - CXR with diffuse bibasilar airspace opacification R>L, concerning for multifocal pneumonia - EKG with ventricular pacing - Lactate normal  Admit for treatment of Community Acquired Pneumonia. Obtain blood cultures x2. Initiate on Rocephin and Azithromycin. Recommend contacting Cardiology concerning admission given scheduled for ablation tomorrow. [facility] for admission.    [place], [place] 08/08/14 2205  Leonard Schwartz, MD 08/08/14 2222

## 2014-08-08 NOTE — ED Notes (Signed)
Pt and family reports pt having afib and chf, scheduled for ablation tomorrow morning but having increase in sob and spo2 89-91% on room air. EKG done at triage. Pt denies any cp.

## 2014-08-09 ENCOUNTER — Inpatient Hospital Stay (HOSPITAL_COMMUNITY): Payer: Medicare Other

## 2014-08-09 ENCOUNTER — Encounter (HOSPITAL_COMMUNITY): Payer: Self-pay | Admitting: *Deleted

## 2014-08-09 ENCOUNTER — Ambulatory Visit (HOSPITAL_COMMUNITY): Admission: RE | Admit: 2014-08-09 | Payer: Medicare Other | Source: Ambulatory Visit | Admitting: Internal Medicine

## 2014-08-09 ENCOUNTER — Encounter (HOSPITAL_COMMUNITY)
Admission: EM | Disposition: A | Discharge status: Home Health | Payer: Self-pay | Source: Home / Self Care | Attending: Internal Medicine

## 2014-08-09 DIAGNOSIS — Z789 Other specified health status: Secondary | ICD-10-CM | POA: Diagnosis present

## 2014-08-09 DIAGNOSIS — J9601 Acute respiratory failure with hypoxia: Secondary | ICD-10-CM

## 2014-08-09 DIAGNOSIS — I480 Paroxysmal atrial fibrillation: Secondary | ICD-10-CM

## 2014-08-09 DIAGNOSIS — I5022 Chronic systolic (congestive) heart failure: Secondary | ICD-10-CM

## 2014-08-09 DIAGNOSIS — J189 Pneumonia, unspecified organism: Secondary | ICD-10-CM

## 2014-08-09 DIAGNOSIS — Z7289 Other problems related to lifestyle: Secondary | ICD-10-CM | POA: Diagnosis present

## 2014-08-09 DIAGNOSIS — I5023 Acute on chronic systolic (congestive) heart failure: Secondary | ICD-10-CM

## 2014-08-09 LAB — COMPREHENSIVE METABOLIC PANEL
ALBUMIN: 2.9 g/dL — AB (ref 3.5–5.0)
ALK PHOS: 82 U/L (ref 38–126)
ALT: 28 U/L (ref 17–63)
AST: 28 U/L (ref 15–41)
Anion gap: 7 (ref 5–15)
BUN: 19 mg/dL (ref 6–20)
CALCIUM: 8.1 mg/dL — AB (ref 8.9–10.3)
CHLORIDE: 100 mmol/L — AB (ref 101–111)
CO2: 32 mmol/L (ref 22–32)
CREATININE: 0.85 mg/dL (ref 0.61–1.24)
GLUCOSE: 85 mg/dL (ref 65–99)
Potassium: 3.6 mmol/L (ref 3.5–5.1)
Sodium: 139 mmol/L (ref 135–145)
Total Bilirubin: 1.5 mg/dL — ABNORMAL HIGH (ref 0.3–1.2)
Total Protein: 5.1 g/dL — ABNORMAL LOW (ref 6.5–8.1)

## 2014-08-09 LAB — HEPATIC FUNCTION PANEL
ALK PHOS: 96 U/L (ref 38–126)
ALT: 30 U/L (ref 17–63)
AST: 36 U/L (ref 15–41)
Albumin: 3.1 g/dL — ABNORMAL LOW (ref 3.5–5.0)
Bilirubin, Direct: 0.5 mg/dL (ref 0.1–0.5)
Indirect Bilirubin: 0.9 mg/dL (ref 0.3–0.9)
TOTAL PROTEIN: 5.6 g/dL — AB (ref 6.5–8.1)
Total Bilirubin: 1.4 mg/dL — ABNORMAL HIGH (ref 0.3–1.2)

## 2014-08-09 LAB — CBC WITH DIFFERENTIAL/PLATELET
BASOS ABS: 0 10*3/uL (ref 0.0–0.1)
BASOS PCT: 0 % (ref 0–1)
Eosinophils Absolute: 0 10*3/uL (ref 0.0–0.7)
Eosinophils Relative: 0 % (ref 0–5)
HCT: 34.6 % — ABNORMAL LOW (ref 39.0–52.0)
Hemoglobin: 11.4 g/dL — ABNORMAL LOW (ref 13.0–17.0)
LYMPHS ABS: 1.3 10*3/uL (ref 0.7–4.0)
Lymphocytes Relative: 15 % (ref 12–46)
MCH: 31 pg (ref 26.0–34.0)
MCHC: 32.9 g/dL (ref 30.0–36.0)
MCV: 94 fL (ref 78.0–100.0)
MONOS PCT: 9 % (ref 3–12)
Monocytes Absolute: 0.8 10*3/uL (ref 0.1–1.0)
Neutro Abs: 6.7 10*3/uL (ref 1.7–7.7)
Neutrophils Relative %: 76 % (ref 43–77)
PLATELETS: 99 10*3/uL — AB (ref 150–400)
RBC: 3.68 MIL/uL — ABNORMAL LOW (ref 4.22–5.81)
RDW: 17.1 % — ABNORMAL HIGH (ref 11.5–15.5)
WBC: 8.7 10*3/uL (ref 4.0–10.5)

## 2014-08-09 LAB — LEGIONELLA ANTIGEN, URINE

## 2014-08-09 LAB — MRSA PCR SCREENING: MRSA BY PCR: NEGATIVE

## 2014-08-09 LAB — LACTIC ACID, PLASMA
Lactic Acid, Venous: 1.2 mmol/L (ref 0.5–2.0)
Lactic Acid, Venous: 0.8 mmol/L (ref 0.5–2.0)
Lactic Acid, Venous: 0.9 mmol/L (ref 0.5–2.0)

## 2014-08-09 LAB — GLUCOSE, CAPILLARY: Glucose-Capillary: 104 mg/dL — ABNORMAL HIGH (ref 65–99)

## 2014-08-09 LAB — PROTIME-INR
INR: 3.27 — ABNORMAL HIGH (ref 0.00–1.49)
PROTHROMBIN TIME: 32.6 s — AB (ref 11.6–15.2)

## 2014-08-09 LAB — STREP PNEUMONIAE URINARY ANTIGEN: Strep Pneumo Urinary Antigen: NEGATIVE

## 2014-08-09 LAB — BRAIN NATRIURETIC PEPTIDE: B Natriuretic Peptide: 2479.2 pg/mL — ABNORMAL HIGH (ref 0.0–100.0)

## 2014-08-09 LAB — PROCALCITONIN: Procalcitonin: 0.88 ng/mL

## 2014-08-09 SURGERY — ECHOCARDIOGRAM, TRANSESOPHAGEAL
Anesthesia: Monitor Anesthesia Care

## 2014-08-09 MED ORDER — WARFARIN SODIUM 2 MG PO TABS: 2.0000 mg | ORAL_TABLET | ORAL | Status: DC

## 2014-08-09 MED ORDER — FUROSEMIDE 10 MG/ML IJ SOLN
INTRAMUSCULAR | Status: AC
  Administered 2014-08-09: 20:00:00
  Filled 2014-08-09: qty 4

## 2014-08-09 MED ORDER — ALBUMIN HUMAN 5 % IV SOLN
12.5000 g | Freq: Once | INTRAVENOUS | Status: AC
  Administered 2014-08-09: 12.5 g via INTRAVENOUS
  Filled 2014-08-09: qty 250

## 2014-08-09 MED ORDER — GUAIFENESIN 100 MG/5ML PO SYRP
600.0000 mg | ORAL_SOLUTION | Freq: Four times a day (QID) | ORAL | Status: DC
  Administered 2014-08-09 – 2014-08-21 (×24): 600 mg via ORAL
  Filled 2014-08-09 (×52): qty 30

## 2014-08-09 MED ORDER — FUROSEMIDE 10 MG/ML IJ SOLN
40.0000 mg | Freq: Once | INTRAMUSCULAR | Status: AC
  Administered 2014-08-09: 40 mg via INTRAVENOUS

## 2014-08-09 MED ORDER — GUAIFENESIN ER 600 MG PO TB12
1200.0000 mg | ORAL_TABLET | Freq: Two times a day (BID) | ORAL | Status: DC
  Administered 2014-08-09: 1200 mg via ORAL
  Filled 2014-08-09 (×3): qty 2

## 2014-08-09 MED ORDER — WARFARIN SODIUM 1 MG PO TABS
1.0000 mg | ORAL_TABLET | Freq: Once | ORAL | Status: AC
  Administered 2014-08-09: 1 mg via ORAL
  Filled 2014-08-09: qty 1

## 2014-08-09 NOTE — Progress Notes (Signed)
SPEECH PATHOLOGY MBS completed; under imaging section.  Dx: primary esophageal dysphagia.  Recs for esophageal w/u discussed with pt, wife, and Dr. Broadus John.  Marris Frontera L. Tivis Ringer, [place] CCC/SLP Pager [phone]

## 2014-08-09 NOTE — Consult Note (Signed)
Heart Failure CONSULT NOTE   Patient ID: Jon Butler MRN: [id number], DOB/AGE: [birth year]   Admit date: 08/08/2014 Date of Consult: 08/09/2014   Primary Physician: [name], MD Primary Cardiologist: Dr. Haroldine Laws Primary Electrophysiologist: Dr. Lovena Le  Pt. Profile  79 year old male with PMH of ETOH abuse, h/o aortic root aneurysm 1989, s/p St Jude Mechanical AVR, LBBB, PAF s/p DCCV in 12/2012, and h/o NICM with EF 20% s/p MDT CRT-D (device changed out 09/20/2012) present with low grade fever, chill, found to have likely multifocal PNA, family also noticed recent weight gain, HF consulted to r/o concomitant acute HF.   Problem List  Past Medical History  Diagnosis Date  . Left bundle-branch block   . Cardiomyopathy   . Chronic systolic heart failure   . Atrial fibrillation     Past Surgical History  Procedure Laterality Date  . Doppler echocardiography  2008, 2009  . Aortic valve replacement    . Tee without cardioversion N/A 10/15/2012    Procedure: TRANSESOPHAGEAL ECHOCARDIOGRAM (TEE);  Surgeon: Fay Records, MD;  Location: [facility] ENDOSCOPY;  Service: Cardiovascular;  Laterality: N/A;  . Cardioversion N/A 10/25/2012    Procedure: CARDIOVERSION;  Surgeon: Evans Lance, MD;  Location: [place];  Service: Cardiovascular;  Laterality: N/A;  . Cardioversion N/A 12/20/2012    Procedure: CARDIOVERSION;  Surgeon: Larey Dresser, MD;  Location: [place];  Service: Cardiovascular;  Laterality: N/A;  . Implantable cardioverter defibrillator generator change N/A 09/20/2012    Procedure: IMPLANTABLE CARDIOVERTER DEFIBRILLATOR GENERATOR CHANGE;  Surgeon: Evans Lance, MD;  Location: [facility] CATH LAB;  Service: Cardiovascular;  Laterality: N/A;     Allergies  No Known Allergies  HPI   79 year old male with PMH of ETOH abuse, h/o aortic root aneurysm 1989, s/p St Jude Mechanical AVR, LBBB, PAF s/p DCCV in 12/2012, and h/o NICM with EF 20% s/p MDT CRT-D (device changed out  09/20/2012). Per family account, his cardiac history dating back to the 1990s when he underwent aortic valve replacement with St. Jude mechanical device and aortic root replacement felt secondary to Marfan syndrome. This was performed at [facility]. He is on chronic Coumadin and he is EF is about 20%. Etiology was felt to be nonischemic in nature. Last cardiac catheterization appears to be on 03/11/2007 which showed widely patent left and right coronary system. The cardiac catheterization was done prior to placement of an AICD. He did have device change out on 09/20/2012. He has been followed by Dr. Haroldine Laws and HF clinic. He continues to drink 2 bottles of alcohol each day. He was previously admitted in October 2014 with cardiogenic shock and required short-term milrinone. Since then he has since been transitioned to 40 mg daily Lasix.  He was last seen in the heart failure clinic on 08/02/2014. It was noted patient had some recent weight gain, he was placed on weekly dose of metolazone in June 2016. Overall, he continued to complain of fatigue. Since his last visit, patient has been doing fairly well. Per his wife, his BP usually range in 100s. He states he has been compliant with his medication, low sodium diet and fluid restriction at home. He does weigh himself at home with weight has been in the 190s. He has been seen by neurology for progressive cognitive decline in the setting of EtOH abuse.  He presented to [facility] on 08/08/2014 after patient's family noticed he appeared pale and has decline in the last week. He also endorsed some chills, however  no fever. Family testes O2 saturation at home which dropped down to 81%. In the ED, chest x-ray showed diffuse bibasilar airspace dx, right greater than left which raises concern for multifocal pneumonia, small bilateral pleural effusion. Family also reports approximately 10 pounds weight gain from baseline. He was admitted to the internal medicine  service for suspected pneumonia and started on an antibiotic. Heart failure service has been consulted given recent weight gain and suspicion for concomitant acute systolic HF.   Of note, per report, patient has been having increasing cough at night. Speech therapy has been consulted and is undergoing workup for potential aspiration. Patient's device was interrogated while in the unit. Pulmonology has also been consulted at this time.   Inpatient Medications  . allopurinol  300 mg Oral Daily  . amiodarone  400 mg Oral BID  . aspirin EC  81 mg Oral Daily  . azithromycin  500 mg Intravenous Q24H  . cefTRIAXone (ROCEPHIN)  IV  1 g Intravenous Q24H  . donepezil  23 mg Oral QHS  . memantine  28 mg Oral Daily  . rosuvastatin  5 mg Oral Daily  . thiamine  100 mg Oral BID  . traZODone  400 mg Oral QHS  . warfarin  1 mg Oral ONCE-1800  . [START ON 08/10/2014] warfarin  2 mg Oral Once per day on Sun Mon Wed Thu Fri  . [START ON 08/12/2014] warfarin  3 mg Oral Once per day on Tue Sat  . Warfarin - Pharmacist Dosing Inpatient   Does not apply q1800    Family History Family History  Problem Relation Age of Onset  . Aneurysm Mother     brain  . Heart attack Father 31     Social History History   Social History  . Marital Status: Married    Spouse Name: [name]  . Number of Children: 5  . Years of Education: 12   Occupational History  . retired    Social History Main Topics  . Smoking status: Never Smoker   . Smokeless tobacco: Never Used  . Alcohol Use: 0.0 oz/week    2-3 Glasses of wine per week     Comment: 2-3 glasses  . Drug Use: No  . Sexual Activity: No   Other Topics Concern  . Not on file   Social History Narrative   Patient is married [name]).   Patient is retired.   5 children    12th grade   2 cups caffeine daily     Review of Systems  General:  No fever, night sweats.  +chill, but no fever. Family states he had 10 lbs weight gain Cardiovascular:  No chest  pain, edema, orthopnea, palpitations, paroxysmal nocturnal dyspnea. +dyspnea on exertion Dermatological: No rash, lesions/masses Respiratory: No dyspnea +chronic cough Urologic: No hematuria, dysuria Abdominal:   No nausea, vomiting, diarrhea, bright red blood per rectum, melena, or hematemesis Neurologic:  No visual changes, changes in mental status. +weakness All other systems reviewed and are otherwise negative except as noted above.  Physical Exam  Blood pressure 93/64, pulse 76, temperature 98.4 F (36.9 C), temperature source Oral, resp. rate 19, height 5\' 9"  (1.753 m), weight 197 lb 12 oz (89.7 kg), SpO2 98 %.  General: Pleasant, NAD Psych: Normal affect. Neuro: Alert and oriented X 3. Moves all extremities spontaneously. HEENT: Normal  Neck: Supple without bruits. JVP 10-11 Lungs:  Resp regular and unlabored. Markedly decreased breath sound in bilateral bases, but no obvious rale or  wheezing.  Heart: RRR no s3, s4. +loud AV click Abdomen: Soft, non-tender, + mildly distended, BS + x 4.  Extremities: No clubbing, cyanosis. Trace edema in RLE, less in LLE. Faint pulse in LE  Labs  No results for input(s): CKTOTAL, CKMB, TROPONINI in the last 72 hours. Lab Results  Component Value Date   WBC 8.7 08/09/2014   HGB 11.4* 08/09/2014   HCT 34.6* 08/09/2014   MCV 94.0 08/09/2014   PLT 99* 08/09/2014    Recent Labs Lab 08/09/14 0745  NA 139  K 3.6  CL 100*  CO2 32  BUN 19  CREATININE 0.85  CALCIUM 8.1*  PROT 5.1*  BILITOT 1.5*  ALKPHOS 82  ALT 28  AST 28  GLUCOSE 85   Lab Results  Component Value Date   CHOL  03/29/2007    183        ATP III CLASSIFICATION:  <200     mg/dL   Desirable  200-239  mg/dL   Borderline High  >=240    mg/dL   High   HDL 50 03/29/2007   LDLCALC * 03/29/2007    106        Total Cholesterol/HDL:CHD Risk Coronary Heart Disease Risk Table                     Men   Women  1/2 Average Risk   3.4   3.3   TRIG 133 03/29/2007   No  results found for: DDIMER  Radiology/Studies  Dg Chest 2 View  08/08/2014   CLINICAL DATA:  Acute onset of shortness of breath and decreased O2 saturation. Initial encounter.  EXAM: CHEST  2 VIEW  COMPARISON:  Chest radiograph performed 03/11/2013  FINDINGS: Diffuse bibasilar airspace opacification, right greater than left, raises concern for multifocal pneumonia. Small bilateral pleural effusions are suspected. No pneumothorax is seen.  The heart is borderline enlarged. The patient is status post median sternotomy. A pacemaker/AICD is noted at the left chest wall, with leads ending at the right atrium, right ventricle and coronary sinus. No acute osseous abnormalities are seen.  IMPRESSION: Diffuse bibasilar airspace opacification, right greater than left, raises concern for multifocal pneumonia. Small bilateral pleural effusions suspected. Borderline cardiomegaly.   Electronically Signed   By: Garald Balding M.D.   On: 08/08/2014 19:19    ECG  ventricularly paced rhythm HR 80s (underlying AFL)  ASSESSMENT AND PLAN  1. Acute respiratory failure, under treatment for PNA  2. Acute on chronic systolic HF, mild  - weight has been stable, goal 194-197 despite pt states he is 10 lbs over  - he does have markedly decreased breath sound near bilateral bases, but no obvious rale. JVD up Trace edema on R, but not in LLE    - medtronic interrogated device impedance decreasing, fluid index approaching threshold  - BP low in 80-90s, coreg and lisinopril on hold. Will discuss with MD regarding lasix, he appears to be only mild fluid overloaded, but degree of HF unlikely to explain his symptom and weakness alone. Agree that PNA likely exacerbated his symptom. May consider 40mg  IV lasix and see response given interrogation result but need treat underlying infection at the same time. Hesitant to be too aggressive with diuresis given low BP  Note interrogation also showed 2 episodes of VT, longest one 4  min?  3. Likely PNA: CXR concerning for PNA, WBC 14.7, trending down  - BP low in 80-90s. Blood culture pending  4.  Persistent atrial fibrillation: TEE DCCV cancelled  - rate controlled now. However coreg held due to BP  5. ETOH abuse  6. h/o NICM with EF 20% s/p MDT CRT-D (device changed out 09/20/2012)  - normal cath 2009  7. h/o aortic root aneurysm 1989 8. s/p St Jude Mechanical AVR 9. LBBB   Signed, Almyra Deforest, Hershal Coria 08/09/2014, 11:06 AM  Patient seen and examined with Almyra Deforest, PA-C. We discussed all aspects of the encounter. I agree with the assessment and plan as stated above. ICD interrogation reviewed personally.   Suspect respiratory compromise likely multifactorial. Due to CHF and possible R>L PNA. CHF clearly related to development of recurrent AF/AFL several weeks ago but was unable to under DC-CV at that time as INR was subtherapeutic. ICD interrogation shows persistent AFL with rising fluid level. Will give 1 dose IV lasix tonight as BP tolerates. Agree with abx. Will plan TEE and DC-CV tomorrow as endo schedules permits.   Bensimhon, Daniel,MD 8:24 PM

## 2014-08-09 NOTE — Progress Notes (Signed)
   08/09/14 0517  Vitals  BP (!) 83/56 mmHg  MAP (mmHg) 62  Pulse Rate (!) 59  ECG Heart Rate 61  Resp 13  Dr Curtis Sites about above Vs, new orders received will continue to monitor

## 2014-08-09 NOTE — Consult Note (Signed)
Name: Jon Butler MRN: [id number] DOB: [birth year]    ADMISSION DATE:  08/08/2014 CONSULTATION DATE:  08/09/14  REFERRING MD :  Dr. Broadus John   CHIEF COMPLAINT:  Concern for Sepsis   BRIEF PATIENT DESCRIPTION: 79 y/o M with PMH of LBBB, chronic systolic CHF with LVEF of 20%, mechanical AVR on coumadin, atrial fibrillation who presented to [facility] on 8/3 with reports of increased LE swelling, fatigue, desaturations (88-89% per son), cough with clear sputum production and chills.  PCCM consulted for possible sepsis.    SIGNIFICANT EVENTS  8/02  Admit   STUDIES:     HISTORY OF PRESENT ILLNESS:  79 y/o M with PMH of LBBB, chronic systolic CHF with LVEF of 20% due to NICM s/p pacemaker insertion, mechanical AVR on coumadin (replaced in late 1980's), atrial fibrillation, mild dementia and ETOH abuse who presented to [facility] on 8/3 with reports of increased LE swelling, fatigue, desaturations (88-89% per son), cough with clear sputum production and chills.    At baseline, the patient lives with his wife.  He does not drive but is independent of ADL's.  He is able to ambulate without rest and climb a flight of stairs.  He is not on oxygen.  He has chronic orthopnea and PND.  The patient reports increased lower extremity swelling up to knees and an approximate 10 lb weight gain. Office notes reflect his goal weight to be 194-197 lbs.  He uses additional sliding scale lasix for dyspnea / increased weight gain at home.    He was scheduled for an ablation on 8/3 with Dr. Haroldine Laws.  However, presented to the ER with above complaints.  He notes he did have some pink sputum late evening on  8/2.  Initial CXR shows small bilateral pleural effusions and mild R>L airspace disease.  BNP 2479, Na 139, CL 100, lactic acid negative, Hgb 11.4, platelets 99 and WBC 8.7.    Patient denies known fevers, chest pain, pain with inspiration, n/v/d, constipation.  Reports shaking chills the day prior to admit & recent fall with  abrasions on knees. Increased swelling up to knees bilaterally.     RN notes patient was on room air earlier in am with saturations of 98%.  He got up to the bedside commode and O2 did not change. Son later reapplied O2.    PCCM consulted for possible sepsis.    PAST MEDICAL HISTORY :   has a past medical history of Left bundle-branch block; Cardiomyopathy; Chronic systolic heart failure; and Atrial fibrillation.  has past surgical history that includes doppler echocardiography (2008, 2009); Aortic valve replacement; TEE without cardioversion (N/A, 10/15/2012); Cardioversion (N/A, 10/25/2012); Cardioversion (N/A, 12/20/2012); and implantable cardioverter defibrillator generator change (N/A, 09/20/2012).   Prior to Admission medications   Medication Sig Start Date End Date Taking? Authorizing Provider  allopurinol (ZYLOPRIM) 300 MG tablet Take 300 mg by mouth daily.    Yes Historical Provider, MD  amiodarone (PACERONE) 400 MG tablet Take 1 tablet (400 mg total) by mouth 2 (two) times daily. 08/02/14  Yes Jolaine Artist, MD  amoxicillin (AMOXIL) 500 MG capsule Take 500 mg by mouth every other day.    Yes Historical Provider, MD  aspirin EC 81 MG tablet Take 1 tablet (81 mg total) by mouth daily. 10/26/12  Yes Brooke O Edmisten, PA-C  carvedilol (COREG) 3.125 MG tablet Take 2 tablets (6.25 mg total) by mouth 2 (two) times daily with a meal. 09/08/13  Yes Evans Lance, MD  Coenzyme Q10 (CO Q-10 PO) Take 1 tablet by mouth daily.    Yes Historical Provider, MD  colchicine 0.6 MG tablet Take 0.6 mg by mouth daily as needed (gout pain).    Yes Historical Provider, MD  donepezil (ARICEPT) 23 MG TABS tablet Take 1 tablet (23 mg total) by mouth at bedtime. 04/20/14  Yes Penni Bombard, MD  fluticasone (FLONASE) 50 MCG/ACT nasal spray Place 2 sprays into both nostrils daily as needed for allergies or rhinitis.  08/17/13  Yes Historical Provider, MD  furosemide (LASIX) 40 MG tablet Take 1 tablet (40 mg  total) by mouth daily. 05/24/14  Yes Shaune Pascal Bensimhon, MD  lisinopril (PRINIVIL,ZESTRIL) 10 MG tablet Take 0.5 tablets (5 mg total) by mouth at bedtime. 07/07/14  Yes Evans Lance, MD  memantine (NAMENDA XR) 28 MG CP24 24 hr capsule Take 1 capsule (28 mg total) by mouth daily. 04/20/14  Yes Penni Bombard, MD  metolazone (ZAROXOLYN) 2.5 MG tablet Take 1 tablet (2.5 mg total) by mouth daily. 06/21/14  Yes Larey Dresser, MD  Multiple Vitamins-Minerals (MULTIVITAMIN WITH MINERALS) tablet Take 1 tablet by mouth daily.    Yes Historical Provider, MD  potassium chloride SA (K-DUR,KLOR-CON) 20 MEQ tablet Take 1 tablet (20 mEq total) by mouth daily. Take 1 tab when you take Metolazone 06/21/14  Yes Larey Dresser, MD  rosuvastatin (CRESTOR) 10 MG tablet Take 5 mg by mouth daily.    Yes Historical Provider, MD  spironolactone (ALDACTONE) 25 MG tablet Take 0.5 tablets (12.5 mg total) by mouth daily. 10/27/12  Yes Evans Lance, MD  thiamine (VITAMIN B-1) 100 MG tablet Take 1 tablet (100 mg total) by mouth 2 (two) times daily. 12/16/12  Yes Amy D Clegg, NP  traZODone (DESYREL) 100 MG tablet Take 400 mg by mouth at bedtime. 06/29/14  Yes Historical Provider, MD  warfarin (COUMADIN) 2 MG tablet Take as directed by coumadin clinic Patient taking differently: Take 2-3 mg by mouth daily at 6 PM. Take 2 mg daily except on Tuesday and Saturday. Take 3 mg on Tuesday and Saturday 01/05/14  Yes Evans Lance, MD   No Known Allergies  FAMILY HISTORY:  family history includes Aneurysm in his mother; Heart attack (age of onset: 85) in his father.   SOCIAL HISTORY:  reports that he has never smoked. He has never used smokeless tobacco. He reports that he drinks alcohol. He reports that he does not use illicit drugs.  REVIEW OF SYSTEMS:   Constitutional: Negative for fever, chills, weight loss, malaise/fatigue and diaphoresis.  HENT: Negative for hearing loss, ear pain, nosebleeds, congestion, sore throat, neck  pain, tinnitus and ear discharge.   Eyes: Negative for blurred vision, double vision, photophobia, pain, discharge and redness.  Respiratory: Negative for hemoptysis, wheezing and stridor.  Reports cough with clear sputum production, one episode of pink sputum, shortness of breath Cardiovascular: Negative for chest pain, palpitations, claudication.  Reports increased leg swelling up to knees, orthopnea and PND.  Gastrointestinal: Negative for heartburn, nausea, vomiting, abdominal pain, diarrhea, constipation, blood in stool and melena.  Genitourinary: Negative for dysuria, urgency, frequency, hematuria and flank pain.  Musculoskeletal: Negative for myalgias, back pain, joint pain and falls.  Skin: Negative for itching and rash.  Neurological: Negative for dizziness, tingling, tremors, sensory change, speech change, focal weakness, seizures, loss of consciousness, weakness and headaches.  Endo/Heme/Allergies: Negative for environmental allergies and polydipsia. Does not bruise/bleed easily.  SUBJECTIVE: Pt's son at bedside.  Patient denies acute complaints - states "I dont know why I am here".  Carries hx of dementia  VITAL SIGNS: Temp:  [98.4 F (36.9 C)-99.9 F (37.7 C)] 98.4 F (36.9 C) (08/03 0800) Pulse Rate:  [59-85] 76 (08/03 0800) Resp:  [13-24] 19 (08/03 0800) BP: (82-111)/(50-78) 93/64 mmHg (08/03 0800) SpO2:  [89 %-99 %] 98 % (08/03 0800) Weight:  [197 lb 12 oz (89.7 kg)-197 lb 12.8 oz (89.721 kg)] 197 lb 12 oz (89.7 kg) (08/03 0036)  PHYSICAL EXAMINATION: General:  wdwn elderly male in NAD Neuro:  Awake, alert, speech clear, MAE  HEENT:  MM pink/moist, good dentition Cardiovascular:  s1s2 irr irr, mechanical valvular click Lungs:  Even/non-labored, lungs bilaterally with fine posterior crackles  Abdomen:  Obese/soft, bsx4 active Musculoskeletal:  No acute deformities  Skin:  Warm/dry, old / healing abrasions on bilateral knees, pitting (1-2+) edema up to knees   Recent  Labs Lab 08/02/14 1500 08/08/14 1853 08/09/14 0745  NA 135 136 139  K 4.0 3.9 3.6  CL 95* 95* 100*  CO2 31 31 32  BUN 16 21* 19  CREATININE 1.12 1.28* 0.85  GLUCOSE 104* 147* 85    Recent Labs Lab 08/08/14 1853 08/09/14 0745  HGB 14.7 11.4*  HCT 44.5 34.6*  WBC 14.7* 8.7  PLT 123* 99*   Dg Chest 2 View  08/08/2014   CLINICAL DATA:  Acute onset of shortness of breath and decreased O2 saturation. Initial encounter.  EXAM: CHEST  2 VIEW  COMPARISON:  Chest radiograph performed 03/11/2013  FINDINGS: Diffuse bibasilar airspace opacification, right greater than left, raises concern for multifocal pneumonia. Small bilateral pleural effusions are suspected. No pneumothorax is seen.  The heart is borderline enlarged. The patient is status post median sternotomy. A pacemaker/AICD is noted at the left chest wall, with leads ending at the right atrium, right ventricle and coronary sinus. No acute osseous abnormalities are seen.  IMPRESSION: Diffuse bibasilar airspace opacification, right greater than left, raises concern for multifocal pneumonia. Small bilateral pleural effusions suspected. Borderline cardiomegaly.   Electronically Signed   By: Garald Balding M.D.   On: 08/08/2014 19:19    ASSESSMENT / PLAN:  Rule Out Sepsis - he does not meet SIRS criteria.  Chart review review reflects his blood pressure is within his normal limits (last office BP 101/71.  Lactic acid negative.  WBC within normal limits.    Plan:  Follow blood cultures Trend lactic acid, PCT ABX for RLL infiltrate Monitor fever curve / WBC  Hypoxemia - presentation saturations 88-91% and was noted to have decrease at home as well.     RLL PNA -  R>L airspace disease Fatigue  Plan: Oxygen as needed to support saturations > 92%.  Was seen earlier in am without O2 with saturations of 98% Pulmonary hygiene:  IS Assess swallowing with SLP (pt denies issues but with hx of dementia reasonable to assess) Repeat CXR in am  8/4 Azithro/Rocephin E7/M   Chronic Systolic CHF - EF 09% Atrial Fibrillation - suspect contributing to decompensated CHF Dyspnea - suspect in setting of decompensated CHF. CXR with bilateral effusions, R>L airspace disease concerning for edema.  Subjectively, he has had worsening swelling as well and does have pitting edema on exam.     Mechanical AVR - on coumadin   Plan: Defer to primary SVC Consider assessment of ECHO, will defer to Cardiology  Defer lasix to Cardiology / CHF   Noe Gens, NP-C [place]  Pgr: [phone] or if no answer [phone] 08/09/2014, 10:40 AM

## 2014-08-09 NOTE — Clinical Documentation Improvement (Signed)
Would you please help clarify medical condition related to current clinical findings?  Pt admitted with BUN of 21 and creatinine of 1.28.  GFR >60   Acute Renal Failure/Acute Kidney Injury Acute Tubular Necrosis Acute Renal Cortical Necrosis Acute Renal Medullary Necrosis Acute on Chronic Renal Failure Chronic Renal Failure Other Condition Cannot Clinically Determine   Thank You, Margretta Sidle ,RN Clinical Documentation Specialist:    [place] [facility] [phone] Cell - [phone]

## 2014-08-09 NOTE — Progress Notes (Signed)
UR COMPLETED  

## 2014-08-09 NOTE — H&P (Signed)
[facility] History and Physical  Patient: Jon Butler  MRN: [id number]  DOB: 1935/08/14  DOS: the patient was seen and examined on 08/08/2014 PCP: [name], MD  Referring physician: Dr. Audie Pinto Chief Complaint: Shortness of breath  HPI: [name] is a 79 y.o. male with Past medical history of left bundle-branch block, chronic systolic CHF, LVEF 56%, aortic replacement with mechanical valve, chronic anticoagulation, atrial fibrillation, alcohol use. The patient is presenting with complaints of shortness of breath. The history is limited since the family is not available and the patient denies having any acute complaints. As per the documentation wife has reported the patient has been becoming more tired fatigue and lethargic and also has developed cough with sputum production. He also had some shaking chills. With this at home when they check his oxygenation and he was saturating 81% on room air with elevated heart rate and therefore they brought to the hospital. The patient was seen by cardiology recently and was scheduled for an outpatient cardioversion for his A. fib. Patient does have chronic orthopnea and PND. Reportedly the patient has gained 10 pounds from his baseline but as per documentation the patient is actually lower than his recent visit with Dr. Tempie Hoist. Reportedly the patient is compliant with all his medications.  The patient is coming from home.  At his baseline ambulates with walkerAnd is dependent for most of his ADL does not manages her medication on his own.  Review of Systems: as mentioned in the history of present illness.  A comprehensive review of the other systems is negative.  Past Medical History  Diagnosis Date  . Left bundle-branch block   . Cardiomyopathy   . Chronic systolic heart failure   . Atrial fibrillation    Past Surgical History  Procedure Laterality Date  . Doppler echocardiography  2008, 2009  . Aortic valve  replacement    . Tee without cardioversion N/A 10/15/2012    Procedure: TRANSESOPHAGEAL ECHOCARDIOGRAM (TEE);  Surgeon: Fay Records, MD;  Location: [facility] ENDOSCOPY;  Service: Cardiovascular;  Laterality: N/A;  . Cardioversion N/A 10/25/2012    Procedure: CARDIOVERSION;  Surgeon: Evans Lance, MD;  Location: [place];  Service: Cardiovascular;  Laterality: N/A;  . Cardioversion N/A 12/20/2012    Procedure: CARDIOVERSION;  Surgeon: Larey Dresser, MD;  Location: [place];  Service: Cardiovascular;  Laterality: N/A;  . Implantable cardioverter defibrillator generator change N/A 09/20/2012    Procedure: IMPLANTABLE CARDIOVERTER DEFIBRILLATOR GENERATOR CHANGE;  Surgeon: Evans Lance, MD;  Location: [facility] CATH LAB;  Service: Cardiovascular;  Laterality: N/A;   Social History:  reports that he has never smoked. He has never used smokeless tobacco. He reports that he drinks alcohol. He reports that he does not use illicit drugs.  No Known Allergies  Family History  Problem Relation Age of Onset  . Aneurysm Mother     brain  . Heart attack Father 47    Prior to Admission medications   Medication Sig Start Date End Date Taking? Authorizing Provider  allopurinol (ZYLOPRIM) 300 MG tablet Take 300 mg by mouth daily.    Yes Historical Provider, MD  amiodarone (PACERONE) 400 MG tablet Take 1 tablet (400 mg total) by mouth 2 (two) times daily. 08/02/14  Yes Jolaine Artist, MD  amoxicillin (AMOXIL) 500 MG capsule Take 500 mg by mouth every other day.    Yes Historical Provider, MD  aspirin EC 81 MG tablet Take 1 tablet (81 mg total) by mouth daily. 10/26/12  Yes Brooke O Edmisten, PA-C  carvedilol (COREG) 3.125 MG tablet Take 2 tablets (6.25 mg total) by mouth 2 (two) times daily with a meal. 09/08/13  Yes Evans Lance, MD  Coenzyme Q10 (CO Q-10 PO) Take 1 tablet by mouth daily.    Yes Historical Provider, MD  colchicine 0.6 MG tablet Take 0.6 mg by mouth daily as needed (gout pain).    Yes Historical  Provider, MD  donepezil (ARICEPT) 23 MG TABS tablet Take 1 tablet (23 mg total) by mouth at bedtime. 04/20/14  Yes Penni Bombard, MD  fluticasone (FLONASE) 50 MCG/ACT nasal spray Place 2 sprays into both nostrils daily as needed for allergies or rhinitis.  08/17/13  Yes Historical Provider, MD  furosemide (LASIX) 40 MG tablet Take 1 tablet (40 mg total) by mouth daily. 05/24/14  Yes Shaune Pascal Bensimhon, MD  lisinopril (PRINIVIL,ZESTRIL) 10 MG tablet Take 0.5 tablets (5 mg total) by mouth at bedtime. 07/07/14  Yes Evans Lance, MD  memantine (NAMENDA XR) 28 MG CP24 24 hr capsule Take 1 capsule (28 mg total) by mouth daily. 04/20/14  Yes Penni Bombard, MD  metolazone (ZAROXOLYN) 2.5 MG tablet Take 1 tablet (2.5 mg total) by mouth daily. 06/21/14  Yes Larey Dresser, MD  Multiple Vitamins-Minerals (MULTIVITAMIN WITH MINERALS) tablet Take 1 tablet by mouth daily.    Yes Historical Provider, MD  potassium chloride SA (K-DUR,KLOR-CON) 20 MEQ tablet Take 1 tablet (20 mEq total) by mouth daily. Take 1 tab when you take Metolazone 06/21/14  Yes Larey Dresser, MD  rosuvastatin (CRESTOR) 10 MG tablet Take 5 mg by mouth daily.    Yes Historical Provider, MD  spironolactone (ALDACTONE) 25 MG tablet Take 0.5 tablets (12.5 mg total) by mouth daily. 10/27/12  Yes Evans Lance, MD  thiamine (VITAMIN B-1) 100 MG tablet Take 1 tablet (100 mg total) by mouth 2 (two) times daily. 12/16/12  Yes Amy D Clegg, NP  traZODone (DESYREL) 100 MG tablet Take 400 mg by mouth at bedtime. 06/29/14  Yes Historical Provider, MD  warfarin (COUMADIN) 2 MG tablet Take as directed by coumadin clinic Patient taking differently: Take 2-3 mg by mouth daily at 6 PM. Take 2 mg daily except on Tuesday and Saturday. Take 3 mg on Tuesday and Saturday 01/05/14  Yes Evans Lance, MD    Physical Exam: Filed Vitals:   08/09/14 0300 08/09/14 0320 08/09/14 0400 08/09/14 0517  BP: 87/52 93/65 102/78 83/56  Pulse: 73 75 74 59  Temp:  98.5  F (36.9 C)    TempSrc:  Oral    Resp: 20 16 20 13   Height:      Weight:      SpO2: 96% 97% 96% 96%    General: Alert, Awake and Oriented to Time, Place and Person. Appear in mild distress Eyes: PERRL ENT: Oral Mucosa clear moist. Neck: Mild JVD Cardiovascular: S1 and S2 Present, aortic systolic Murmur, Peripheral Pulses Present Respiratory: Bilateral Air entry equal and Decreased,  Bilateral basal Crackles, no wheezes Abdomen: Bowel Sound present, Soft and non- tender Skin: No Rash Extremities: Bilateral Pedal edema, no calf tenderness Neurologic: Grossly no focal neuro deficit.  Labs on Admission:  CBC:  Recent Labs Lab 08/08/14 1853 08/08/14 2328  WBC 14.7*  --   NEUTROABS  --  11.7*  HGB 14.7  --   HCT 44.5  --   MCV 94.7  --   PLT 123*  --  CMP     Component Value Date/Time   NA 136 08/08/2014 1853   K 3.9 08/08/2014 1853   CL 95* 08/08/2014 1853   CO2 31 08/08/2014 1853   GLUCOSE 147* 08/08/2014 1853   BUN 21* 08/08/2014 1853   CREATININE 1.28* 08/08/2014 1853   CALCIUM 8.7* 08/08/2014 1853   PROT 5.6* 08/08/2014 2328   ALBUMIN 3.1* 08/08/2014 2328   AST 36 08/08/2014 2328   ALT 30 08/08/2014 2328   ALKPHOS 96 08/08/2014 2328   BILITOT 1.4* 08/08/2014 2328   GFRNONAA 53* 08/08/2014 1853   GFRAA >60 08/08/2014 1853    No results for input(s): LIPASE, AMYLASE in the last 168 hours.  No results for input(s): CKTOTAL, CKMB, CKMBINDEX, TROPONINI in the last 168 hours. BNP (last 3 results) No results for input(s): BNP in the last 8760 hours.  ProBNP (last 3 results) No results for input(s): PROBNP in the last 8760 hours.   Radiological Exams on Admission: Dg Chest 2 View  08/08/2014   CLINICAL DATA:  Acute onset of shortness of breath and decreased O2 saturation. Initial encounter.  EXAM: CHEST  2 VIEW  COMPARISON:  Chest radiograph performed 03/11/2013  FINDINGS: Diffuse bibasilar airspace opacification, right greater than left, raises concern  for multifocal pneumonia. Small bilateral pleural effusions are suspected. No pneumothorax is seen.  The heart is borderline enlarged. The patient is status post median sternotomy. A pacemaker/AICD is noted at the left chest wall, with leads ending at the right atrium, right ventricle and coronary sinus. No acute osseous abnormalities are seen.  IMPRESSION: Diffuse bibasilar airspace opacification, right greater than left, raises concern for multifocal pneumonia. Small bilateral pleural effusions suspected. Borderline cardiomegaly.   Electronically Signed   By: Garald Balding M.D.   On: 08/08/2014 19:19   EKG: Independently reviewed. Regularly paced rhythm.  Assessment/Plan Principal Problem:   Sepsis Active Problems:   Biventricular implantable cardioverter-defibrillator in situ   Atrial fibrillation   Acute on chronic systolic heart failure   Long term current use of anticoagulant therapy   Community acquired pneumonia   Alcohol use   1. Sepsis The patient is presenting with complaints of shortness of breath cough as well as chills. Chest x-ray shows multifocal pneumonia. He has leukocytosis as well as hypoxia and low-grade temperature. He is also hypotensive. This could represent probable pneumonia based on chest x-ray. With this the patient will be admitted in stepdown unit. I will check lactic acid progress on level. Continue with him with ceftriaxone azithromycin for community-acquired coverage. Follow cultures. Aggressive IV hydration options are limited secondary to his chronic heart failure.  2. Possible acute on chronic CHF. The patient does have hypoxia as well as volume overload as per family. At present due to low blood pressure and ongoing sepsis aggressive treatment for CHF is limited. I will check his BNP level. Cardiology will be consulted in the morning.  3. Atrial fibrillation. The patient is scheduled to have cardioversion today morning. Unfortunately in the  setting of sepsis as well as ongoing CHF with low blood pressure DC CV need to be on hold. Continue anticoagulation.  4. Aortic mechanical valve. Continue anticoagulation.  5. History of alcohol use. The patient has history of alcohol use which is also associated with cardiomyopathy. We'll continue thiamin currently.  Advance goals of care discussion: Full code as per the discussion with patient   DVT Prophylaxis: on chronic therapeutic anticoagulation. Nutrition: Cardiac diet  Disposition: Admitted as inpatient, step-down unit.  Author:  Berle Mull, MD [facility] Pager: [phone] 08/08/2014  If 7PM-7AM, please contact night-coverage [email or web address] Password [email or web address]

## 2014-08-09 NOTE — Progress Notes (Addendum)
[facility] PROGRESS NOTE  Jon Butler TGG:[id number] DOB: 1935/06/07 DOA: 08/08/2014 PCP: [name], MD  Assessment/Plan: 1. Acute hypoxic respiratory failure -Suspect multifocal factorial secondary to community-acquired pneumonia as well as a component of systolic CHF -Continue current antibiotic regimen, follow blood cultures -Blood pressure too soft to tolerate diuresis, resume Coreg and lisinopril when appropriate -Cards to see -We will check swallow evaluation to rule out silent aspiration  2. Acute on chronic systolic CHF/NICM with EF of 20% status post MDT CRT -D -as above  3. Persistent atrial fibrillation -Was supposed to have DC CV today -Defer to cardiology -On amiodarone  4. History of aortic mechanical valve ([employer]) -Continue warfarin   5. History of EtOH abuse - continue thiamine,   6.  acute on chronic thrombocytopenia  -Monitor for now   7. Chronic left bundle branch block   8. Mild cognitive  Dysfunction  -suspected to be due to alcohol use - Code Status: Full Code Family Communication: son at bedside Disposition Plan: home when stable   Consultants:  CHF  HPI/Subjective: Feels ok, very poor historian, son reports weights stable even though Po intake poor  Objective: Filed Vitals:   08/09/14 1200  BP: 101/56  Pulse: 76  Temp:   Resp: 19    Intake/Output Summary (Last 24 hours) at 08/09/14 1242 Last data filed at 08/09/14 1200  Gross per 24 hour  Intake    440 ml  Output     75 ml  Net    365 ml   Filed Weights   08/08/14 1843 08/09/14 0036  Weight: 89.721 kg (197 lb 12.8 oz) 89.7 kg (197 lb 12 oz)    Exam:   General:  AAOx3, no distress  Cardiovascular: [id number], click noted  Respiratory: some basilar ronchi noted  Abdomen: soft, NT, BS present  Musculoskeletal: 1-2plus pitting edema, extending up to knee   Data Reviewed: Basic Metabolic Panel:  Recent Labs Lab 08/02/14 1500 08/08/14 1853  08/09/14 0745  NA 135 136 139  K 4.0 3.9 3.6  CL 95* 95* 100*  CO2 31 31 32  GLUCOSE 104* 147* 85  BUN 16 21* 19  CREATININE 1.12 1.28* 0.85  CALCIUM 8.4* 8.7* 8.1*   Liver Function Tests:  Recent Labs Lab 08/08/14 2328 08/09/14 0745  AST 36 28  ALT 30 28  ALKPHOS 96 82  BILITOT 1.4* 1.5*  PROT 5.6* 5.1*  ALBUMIN 3.1* 2.9*   No results for input(s): LIPASE, AMYLASE in the last 168 hours. No results for input(s): AMMONIA in the last 168 hours. CBC:  Recent Labs Lab 08/08/14 1853 08/08/14 2328 08/09/14 0745  WBC 14.7*  --  8.7  NEUTROABS  --  11.7* 6.7  HGB 14.7  --  11.4*  HCT 44.5  --  34.6*  MCV 94.7  --  94.0  PLT 123*  --  99*   Cardiac Enzymes: No results for input(s): CKTOTAL, CKMB, CKMBINDEX, TROPONINI in the last 168 hours. BNP (last 3 results)  Recent Labs  08/09/14 0745  BNP 2479.2*    ProBNP (last 3 results) No results for input(s): PROBNP in the last 8760 hours.  CBG: No results for input(s): GLUCAP in the last 168 hours.  Recent Results (from the past 240 hour(s))  MRSA PCR Screening     Status: None   Collection Time: 08/09/14  1:49 AM  Result Value Ref Range Status   MRSA by PCR NEGATIVE NEGATIVE Final    Comment:  The GeneXpert MRSA Assay (FDA approved for NASAL specimens only), is one component of a comprehensive MRSA colonization surveillance program. It is not intended to diagnose MRSA infection nor to guide or monitor treatment for MRSA infections.      Studies: Dg Chest 2 View  08/08/2014   CLINICAL DATA:  Acute onset of shortness of breath and decreased O2 saturation. Initial encounter.  EXAM: CHEST  2 VIEW  COMPARISON:  Chest radiograph performed 03/11/2013  FINDINGS: Diffuse bibasilar airspace opacification, right greater than left, raises concern for multifocal pneumonia. Small bilateral pleural effusions are suspected. No pneumothorax is seen.  The heart is borderline enlarged. The patient is status post median  sternotomy. A pacemaker/AICD is noted at the left chest wall, with leads ending at the right atrium, right ventricle and coronary sinus. No acute osseous abnormalities are seen.  IMPRESSION: Diffuse bibasilar airspace opacification, right greater than left, raises concern for multifocal pneumonia. Small bilateral pleural effusions suspected. Borderline cardiomegaly.   Electronically Signed   By: Garald Balding M.D.   On: 08/08/2014 19:19    Scheduled Meds: . allopurinol  300 mg Oral Daily  . amiodarone  400 mg Oral BID  . aspirin EC  81 mg Oral Daily  . azithromycin  500 mg Intravenous Q24H  . cefTRIAXone (ROCEPHIN)  IV  1 g Intravenous Q24H  . donepezil  23 mg Oral QHS  . memantine  28 mg Oral Daily  . rosuvastatin  5 mg Oral Daily  . thiamine  100 mg Oral BID  . traZODone  400 mg Oral QHS  . warfarin  1 mg Oral ONCE-1800  . Warfarin - Pharmacist Dosing Inpatient   Does not apply q1800   Continuous Infusions:  Antibiotics Given (last 72 hours)    None      Principal Problem:   Sepsis Active Problems:   Biventricular implantable cardioverter-defibrillator in situ   Atrial fibrillation   Acute on chronic systolic heart failure   Long term current use of anticoagulant therapy   Community acquired pneumonia   Alcohol use    Time spent:80min    [facility]  [facility] Pager [phone]. If 7PM-7AM, please contact night-coverage at [email or web address], password [facility] 08/09/2014, 12:42 PM  LOS: 1 day

## 2014-08-09 NOTE — Evaluation (Signed)
Clinical/Bedside Swallow Evaluation Patient Details  Name: Jon Butler MRN: [id number] Date of Birth: March 07, 1935  Today's Date: 08/09/2014 Time: SLP Start Time (ACUTE ONLY): 88 SLP Stop Time (ACUTE ONLY): 1120 SLP Time Calculation (min) (ACUTE ONLY): 15 min  Past Medical History:  Past Medical History  Diagnosis Date  . Left bundle-branch block   . Cardiomyopathy   . Chronic systolic heart failure   . Atrial fibrillation    Past Surgical History:  Past Surgical History  Procedure Laterality Date  . Doppler echocardiography  2008, 2009  . Aortic valve replacement    . Tee without cardioversion N/A 10/15/2012    Procedure: TRANSESOPHAGEAL ECHOCARDIOGRAM (TEE);  Surgeon: Fay Records, MD;  Location[facility] ENDOSCOPY;  Service: Cardiovascular;  Laterality: N/A;  . Cardioversion N/A 10/25/2012    Procedure: CARDIOVERSION;  Surgeon: Evans Lance, MD;  Location: [place];  Service: Cardiovascular;  Laterality: N/A;  . Cardioversion N/A 12/20/2012    Procedure: CARDIOVERSION;  Surgeon: Larey Dresser, MD;  Location: [place];  Service: Cardiovascular;  Laterality: N/A;  . Implantable cardioverter defibrillator generator change N/A 09/20/2012    Procedure: IMPLANTABLE CARDIOVERTER DEFIBRILLATOR GENERATOR CHANGE;  Surgeon: Evans Lance, MD;  Location: [facility] CATH LAB;  Service: Cardiovascular;  Laterality: N/A;   HPI:  Jon Butler is a 79 y.o. male with Past medical history of left bundle-branch block, chronic systolic CHF, LVEF 36%, aortic replacement with mechanical valve, chronic anticoagulation, atrial fibrillation, alcohol use.The patient is presenting with complaints of shortness of breath cough as well as chills. Chest x-ray shows multifocal pneumonia. He has leukocytosis as well as hypoxia and low-grade temperature.  Pt with a vague history of esophageal dysphagia.    Assessment / Plan / Recommendation Clinical Impression  Pt demonstrates evidence concerning for dysphagia with  aspiration, possibly esophageal in nature. Initially pt able to consume liquids and purees without any difficulty, but after RN gave him 5 pills, one at a time with sips of water, pt experienced a significant aspiration event with prolonged, wet, hard coughing. Concern for backflow from esophagus. Family and pt initally reported pt did not have any trouble swallowing, but then after choking event, "oh that happens all the time." Objective test warranted to determine if there is risk of aspiration with PO intake. Will f/u with [facility]BS today, continue current diet in the meantime with pills given crushed if possible, or whole in puree.     Aspiration Risk  Moderate    Diet Recommendation Age appropriate regular solids;Thin   Medication Administration: Crushed with puree Compensations: Slow rate;Small sips/bites    Other  Recommendations Oral Care Recommendations: Oral care BID   Follow Up Recommendations       Frequency and Duration min 2x/week      Pertinent Vitals/Pain NA    SLP Swallow Goals     Swallow Study Prior Functional Status       General Other Pertinent Information: Jon Butler is a 79 y.o. male with Past medical history of left bundle-branch block, chronic systolic CHF, LVEF 62%, aortic replacement with mechanical valve, chronic anticoagulation, atrial fibrillation, alcohol use.The patient is presenting with complaints of shortness of breath cough as well as chills. Chest x-ray shows multifocal pneumonia. He has leukocytosis as well as hypoxia and low-grade temperature.  Pt with a vague history of esophageal dysphagia.  Type of Study: Modified Barium Swallowing Study Previous Swallow Assessment: BSE 2014 Diet Prior to this Study: Regular;Thin liquids Temperature Spikes Noted: No Respiratory  Status: Supplemental O2 delivered via (comment) History of Recent Intubation: No Behavior/Cognition: Alert;Cooperative;Pleasant mood Oral Cavity - Dentition: Adequate natural  dentition/normal for age Self-Feeding Abilities: Able to feed self Patient Positioning: Upright in bed Baseline Vocal Quality: Normal Volitional Cough: Strong Volitional Swallow: Able to elicit    Oral/Motor/Sensory Function Overall Oral Motor/Sensory Function: Appears within functional limits for tasks assessed   Ice Chips     Thin Liquid Thin Liquid: Impaired Presentation: Cup;Straw;Self Fed Pharyngeal  Phase Impairments: Cough - Immediate;Wet Vocal Quality (Initially fine, after 5th pill with water hard wet cough)    Nectar Thick Nectar Thick Liquid: Not tested   Honey Thick Honey Thick Liquid: Not tested   Puree Puree: Within functional limits   Solid   GO    Solid: Not tested      Herbie Baltimore, MA CCC-SLP [phone]  Lynann Beaver 08/09/2014,11:28 AM

## 2014-08-10 ENCOUNTER — Inpatient Hospital Stay (HOSPITAL_COMMUNITY): Payer: Medicare Other

## 2014-08-10 ENCOUNTER — Encounter (HOSPITAL_COMMUNITY)
Admission: EM | Disposition: A | Discharge status: Home Health | Payer: Self-pay | Source: Home / Self Care | Attending: Internal Medicine

## 2014-08-10 ENCOUNTER — Ambulatory Visit (HOSPITAL_COMMUNITY): Payer: Medicare Other

## 2014-08-10 ENCOUNTER — Inpatient Hospital Stay (HOSPITAL_COMMUNITY): Payer: Medicare Other | Admitting: Certified Registered Nurse Anesthetist

## 2014-08-10 DIAGNOSIS — I481 Persistent atrial fibrillation: Secondary | ICD-10-CM

## 2014-08-10 DIAGNOSIS — Z95810 Presence of automatic (implantable) cardiac defibrillator: Secondary | ICD-10-CM

## 2014-08-10 HISTORY — PX: TEE WITHOUT CARDIOVERSION: SHX5443

## 2014-08-10 HISTORY — PX: CARDIOVERSION: SHX1299

## 2014-08-10 LAB — PROTIME-INR
INR: 3.67 — AB (ref 0.00–1.49)
Prothrombin Time: 35.6 seconds — ABNORMAL HIGH (ref 11.6–15.2)

## 2014-08-10 SURGERY — ECHOCARDIOGRAM, TRANSESOPHAGEAL
Anesthesia: Monitor Anesthesia Care

## 2014-08-10 MED ORDER — SODIUM CHLORIDE 0.9 % IJ SOLN
3.0000 mL | INTRAMUSCULAR | Status: DC | PRN
  Administered 2014-08-21: 3 mL via INTRAVENOUS
  Filled 2014-08-10: qty 3

## 2014-08-10 MED ORDER — SODIUM CHLORIDE 0.9 % IJ SOLN: 3.0000 mL | INTRAMUSCULAR | Status: DC | PRN

## 2014-08-10 MED ORDER — POTASSIUM CHLORIDE ER 10 MEQ PO TBCR
20.0000 meq | EXTENDED_RELEASE_TABLET | Freq: Two times a day (BID) | ORAL | Status: AC
  Administered 2014-08-10 (×2): 20 meq via ORAL
  Filled 2014-08-10 (×2): qty 2

## 2014-08-10 MED ORDER — LACTATED RINGERS IV SOLN
INTRAVENOUS | Status: DC | PRN
  Administered 2014-08-10: 14:00:00 via INTRAVENOUS

## 2014-08-10 MED ORDER — SODIUM CHLORIDE 0.9 % IV SOLN
250.0000 mL | INTRAVENOUS | Status: DC
Start: 2014-08-10 — End: 2014-08-21
  Administered 2014-08-16: 250 mL via INTRAVENOUS

## 2014-08-10 MED ORDER — LACTATED RINGERS IV SOLN
Freq: Once | INTRAVENOUS | Status: AC
  Administered 2014-08-10: 1000 mL via INTRAVENOUS

## 2014-08-10 MED ORDER — FUROSEMIDE 10 MG/ML IJ SOLN
40.0000 mg | Freq: Once | INTRAMUSCULAR | Status: AC
  Administered 2014-08-10: 40 mg via INTRAVENOUS

## 2014-08-10 MED ORDER — PROPOFOL INFUSION 10 MG/ML OPTIME
INTRAVENOUS | Status: DC | PRN
  Administered 2014-08-10: 50 ug/kg/min via INTRAVENOUS

## 2014-08-10 MED ORDER — PROPOFOL 10 MG/ML IV BOLUS
INTRAVENOUS | Status: DC | PRN
  Administered 2014-08-10: 60 mg via INTRAVENOUS

## 2014-08-10 MED ORDER — SODIUM CHLORIDE 0.9 % IJ SOLN: 3.0000 mL | Freq: Two times a day (BID) | INTRAMUSCULAR | Status: DC

## 2014-08-10 MED ORDER — FUROSEMIDE 10 MG/ML IJ SOLN
40.0000 mg | Freq: Two times a day (BID) | INTRAMUSCULAR | Status: AC
  Administered 2014-08-10: 40 mg via INTRAVENOUS
  Filled 2014-08-10 (×2): qty 4

## 2014-08-10 MED ORDER — SODIUM CHLORIDE 0.9 % IV SOLN: 250.0000 mL | INTRAVENOUS | Status: DC | PRN

## 2014-08-10 MED ORDER — SODIUM CHLORIDE 0.9 % IJ SOLN
3.0000 mL | Freq: Two times a day (BID) | INTRAMUSCULAR | Status: DC
Start: 2014-08-10 — End: 2014-08-21
  Administered 2014-08-10 – 2014-08-20 (×10): 3 mL via INTRAVENOUS

## 2014-08-10 NOTE — Progress Notes (Signed)
PHARMACY NOTE  Consult :  Coumadin Indication :  Atrial fibrillation, AVR  Dosing Wt :  90 kg  LABS :  Recent Labs  08/07/14 1423 08/08/14 1853 08/09/14 0312 08/09/14 0745 08/10/14 0234  HGB  --  14.7  --  11.4*  --   HCT  --  44.5  --  34.6*  --   PLT  --  123*  --  99*  --   LABPROT  --  29.5* 32.6*  --  35.6*  INR 2.8 2.86* 3.27*  --  3.67*  CREATININE  --  1.28*  --  0.85  --     MEDICATION: Medication PTA: Prescriptions prior to admission  Medication Sig Dispense Refill Last Dose  . allopurinol (ZYLOPRIM) 300 MG tablet Take 300 mg by mouth daily.    08/08/2014 at Unknown time  . amiodarone (PACERONE) 400 MG tablet Take 1 tablet (400 mg total) by mouth 2 (two) times daily. 60 tablet 6 08/08/2014 at Unknown time  . amoxicillin (AMOXIL) 500 MG capsule Take 500 mg by mouth every other day.    08/07/2014 at Unknown time  . aspirin EC 81 MG tablet Take 1 tablet (81 mg total) by mouth daily.   08/08/2014 at Unknown time  . carvedilol (COREG) 3.125 MG tablet Take 2 tablets (6.25 mg total) by mouth 2 (two) times daily with a meal. 120 tablet 1 08/08/2014 at 1600  . Coenzyme Q10 (CO Q-10 PO) Take 1 tablet by mouth daily.    08/08/2014 at Unknown time  . colchicine 0.6 MG tablet Take 0.6 mg by mouth daily as needed (gout pain).    over 30 days  . donepezil (ARICEPT) 23 MG TABS tablet Take 1 tablet (23 mg total) by mouth at bedtime. 30 tablet 6 08/08/2014 at Unknown time  . fluticasone (FLONASE) 50 MCG/ACT nasal spray Place 2 sprays into both nostrils daily as needed for allergies or rhinitis.    over 30 days  . furosemide (LASIX) 40 MG tablet Take 1 tablet (40 mg total) by mouth daily. 30 tablet  08/08/2014 at Unknown time  . lisinopril (PRINIVIL,ZESTRIL) 10 MG tablet Take 0.5 tablets (5 mg total) by mouth at bedtime. 30 tablet 4 08/08/2014 at Unknown time  . memantine (NAMENDA XR) 28 MG CP24 24 hr capsule Take 1 capsule (28 mg total) by mouth daily. 30 capsule 6  08/08/2014 at Unknown time  . metolazone (ZAROXOLYN) 2.5 MG tablet Take 1 tablet (2.5 mg total) by mouth daily. 10 tablet 3 08/08/2014 at Unknown time  . Multiple Vitamins-Minerals (MULTIVITAMIN WITH MINERALS) tablet Take 1 tablet by mouth daily.    08/08/2014 at Unknown time  . potassium chloride SA (K-DUR,KLOR-CON) 20 MEQ tablet Take 1 tablet (20 mEq total) by mouth daily. Take 1 tab when you take Metolazone 10 tablet 3 08/08/2014 at Unknown time  . rosuvastatin (CRESTOR) 10 MG tablet Take 5 mg by mouth daily.    08/08/2014 at Unknown time  . spironolactone (ALDACTONE) 25 MG tablet Take 0.5 tablets (12.5 mg total) by mouth daily. 30 tablet 3 08/08/2014 at Unknown time  . thiamine (VITAMIN B-1) 100 MG tablet Take 1 tablet (100 mg total) by mouth 2 (two) times daily. 60 tablet 6 08/08/2014 at Unknown time  . traZODone (DESYREL) 100 MG tablet Take 400 mg by mouth at bedtime.  2 08/08/2014 at Unknown time  . warfarin (COUMADIN) 2 MG tablet Take as directed by coumadin clinic (Patient taking differently: Take 2-3 mg by mouth  daily at 6 PM. Take 2 mg daily except on Tuesday and Saturday. Take 3 mg on Tuesday and Saturday) 90 tablet 1 08/08/2014 at Unknown time   Scheduled:  Scheduled:  . allopurinol  300 mg Oral Daily  . amiodarone  400 mg Oral BID  . aspirin EC  81 mg Oral Daily  . azithromycin  500 mg Intravenous Q24H  . cefTRIAXone (ROCEPHIN)  IV  1 g Intravenous Q24H  . donepezil  23 mg Oral QHS  . furosemide  40 mg Intravenous Once  . furosemide  40 mg Intravenous BID  . guaifenesin  600 mg Oral 4 times per day  . memantine  28 mg Oral Daily  . potassium chloride  20 mEq Oral BID  . rosuvastatin  5 mg Oral Daily  . thiamine  100 mg Oral BID  . traZODone  400 mg Oral QHS  . Warfarin - Pharmacist Dosing Inpatient   Does not apply q1800   Infusion[s]: Infusions:   Antibiotic[s]: Anti-infectives    Start     Dose/Rate Route Frequency Ordered Stop   08/08/14 2300  cefTRIAXone (ROCEPHIN) 1 g in dextrose 5  % 50 mL IVPB     1 g 100 mL/hr over 30 Minutes Intravenous Every 24 hours 08/08/14 2257 08/15/14 1959   08/08/14 2300  azithromycin (ZITHROMAX) 500 mg in dextrose 5 % 250 mL IVPB     500 mg 250 mL/hr over 60 Minutes Intravenous Every 24 hours 08/08/14 2257 08/15/14 2159   08/08/14 2100  cefTRIAXone (ROCEPHIN) 1 g in dextrose 5 % 50 mL IVPB  Status:  Discontinued     1 g 100 mL/hr over 30 Minutes Intravenous Every 24 hours 08/08/14 2059 08/09/14 0039   08/08/14 2100  azithromycin (ZITHROMAX) 500 mg in dextrose 5 % 250 mL IVPB  Status:  Discontinued     500 mg 250 mL/hr over 60 Minutes Intravenous Every 24 hours 08/08/14 2059 08/09/14 0039      ASSESSMENT :  79 y.o. male is currently on chronic Coumadin for Afib and AVR.   Today's INR continuing to climb despite dose reduction.   INR  3.67  Patient to have TEE, DCCV today  No evidence of bleeding complications observed.  GOAL :  TARGET INR 2-3   PLAN : 1. Will hold Coumadin dose today. 2. Daily INR's, CBC. Monitor for bleeding complications.   Follow Platelet counts.  Marthenia Rolling,  Pharm.D   08/10/2014,  10:28 AM    08/10/2014  10:28 AM

## 2014-08-10 NOTE — H&P (View-Only) (Signed)
Heart Failure CONSULT NOTE   Patient ID: Jon Butler MRN: [id number], DOB/AGE: 11-Sep-1935   Admit date: 08/08/2014 Date of Consult: 08/09/2014   Primary Physician: [name], MD Primary Cardiologist: Dr. Haroldine Laws Primary Electrophysiologist: Dr. Lovena Le  Pt. Profile  79 year old male with PMH of ETOH abuse, h/o aortic root aneurysm 1989, s/p St Jude Mechanical AVR, LBBB, PAF s/p DCCV in 12/2012, and h/o NICM with EF 20% s/p MDT CRT-D (device changed out 09/20/2012) present with low grade fever, chill, found to have likely multifocal PNA, family also noticed recent weight gain, HF consulted to r/o concomitant acute HF.   Problem List  Past Medical History  Diagnosis Date  . Left bundle-branch block   . Cardiomyopathy   . Chronic systolic heart failure   . Atrial fibrillation     Past Surgical History  Procedure Laterality Date  . Doppler echocardiography  2008, 2009  . Aortic valve replacement    . Tee without cardioversion N/A 10/15/2012    Procedure: TRANSESOPHAGEAL ECHOCARDIOGRAM (TEE);  Surgeon: Fay Records, MD;  Location: [facility] ENDOSCOPY;  Service: Cardiovascular;  Laterality: N/A;  . Cardioversion N/A 10/25/2012    Procedure: CARDIOVERSION;  Surgeon: Evans Lance, MD;  Location: [place];  Service: Cardiovascular;  Laterality: N/A;  . Cardioversion N/A 12/20/2012    Procedure: CARDIOVERSION;  Surgeon: Larey Dresser, MD;  Location: [place];  Service: Cardiovascular;  Laterality: N/A;  . Implantable cardioverter defibrillator generator change N/A 09/20/2012    Procedure: IMPLANTABLE CARDIOVERTER DEFIBRILLATOR GENERATOR CHANGE;  Surgeon: Evans Lance, MD;  Location: [facility] CATH LAB;  Service: Cardiovascular;  Laterality: N/A;     Allergies  No Known Allergies  HPI   79 year old male with PMH of ETOH abuse, h/o aortic root aneurysm 1989, s/p St Jude Mechanical AVR, LBBB, PAF s/p DCCV in 12/2012, and h/o NICM with EF 20% s/p MDT CRT-D (device changed out  09/20/2012). Per family account, his cardiac history dating back to the 1990s when he underwent aortic valve replacement with St. Jude mechanical device and aortic root replacement felt secondary to Marfan syndrome. This was performed at [facility]. He is on chronic Coumadin and he is EF is about 20%. Etiology was felt to be nonischemic in nature. Last cardiac catheterization appears to be on 03/11/2007 which showed widely patent left and right coronary system. The cardiac catheterization was done prior to placement of an AICD. He did have device change out on 09/20/2012. He has been followed by Dr. Haroldine Laws and HF clinic. He continues to drink 2 bottles of alcohol each day. He was previously admitted in October 2014 with cardiogenic shock and required short-term milrinone. Since then he has since been transitioned to 40 mg daily Lasix.  He was last seen in the heart failure clinic on 08/02/2014. It was noted patient had some recent weight gain, he was placed on weekly dose of metolazone in June 2016. Overall, he continued to complain of fatigue. Since his last visit, patient has been doing fairly well. Per his wife, his BP usually range in 100s. He states he has been compliant with his medication, low sodium diet and fluid restriction at home. He does weigh himself at home with weight has been in the 190s. He has been seen by neurology for progressive cognitive decline in the setting of EtOH abuse.  He presented to [facility] on 08/08/2014 after patient's family noticed he appeared pale and has decline in the last week. He also endorsed some chills, however  no fever. Family testes O2 saturation at home which dropped down to 81%. In the ED, chest x-ray showed diffuse bibasilar airspace dx, right greater than left which raises concern for multifocal pneumonia, small bilateral pleural effusion. Family also reports approximately 10 pounds weight gain from baseline. He was admitted to the internal medicine  service for suspected pneumonia and started on an antibiotic. Heart failure service has been consulted given recent weight gain and suspicion for concomitant acute systolic HF.   Of note, per report, patient has been having increasing cough at night. Speech therapy has been consulted and is undergoing workup for potential aspiration. Patient's device was interrogated while in the unit. Pulmonology has also been consulted at this time.   Inpatient Medications  . allopurinol  300 mg Oral Daily  . amiodarone  400 mg Oral BID  . aspirin EC  81 mg Oral Daily  . azithromycin  500 mg Intravenous Q24H  . cefTRIAXone (ROCEPHIN)  IV  1 g Intravenous Q24H  . donepezil  23 mg Oral QHS  . memantine  28 mg Oral Daily  . rosuvastatin  5 mg Oral Daily  . thiamine  100 mg Oral BID  . traZODone  400 mg Oral QHS  . warfarin  1 mg Oral ONCE-1800  . [START ON 08/10/2014] warfarin  2 mg Oral Once per day on Sun Mon Wed Thu Fri  . [START ON 08/12/2014] warfarin  3 mg Oral Once per day on Tue Sat  . Warfarin - Pharmacist Dosing Inpatient   Does not apply q1800    Family History Family History  Problem Relation Age of Onset  . Aneurysm Mother     brain  . Heart attack Father 40     Social History History   Social History  . Marital Status: Married    Spouse Name: [name]  . Number of Children: 5  . Years of Education: 12   Occupational History  . retired    Social History Main Topics  . Smoking status: Never Smoker   . Smokeless tobacco: Never Used  . Alcohol Use: 0.0 oz/week    2-3 Glasses of wine per week     Comment: 2-3 glasses  . Drug Use: No  . Sexual Activity: No   Other Topics Concern  . Not on file   Social History Narrative   Patient is married [name]).   Patient is retired.   5 children    12th grade   2 cups caffeine daily     Review of Systems  General:  No fever, night sweats.  +chill, but no fever. Family states he had 10 lbs weight gain Cardiovascular:  No chest  pain, edema, orthopnea, palpitations, paroxysmal nocturnal dyspnea. +dyspnea on exertion Dermatological: No rash, lesions/masses Respiratory: No dyspnea +chronic cough Urologic: No hematuria, dysuria Abdominal:   No nausea, vomiting, diarrhea, bright red blood per rectum, melena, or hematemesis Neurologic:  No visual changes, changes in mental status. +weakness All other systems reviewed and are otherwise negative except as noted above.  Physical Exam  Blood pressure 93/64, pulse 76, temperature 98.4 F (36.9 C), temperature source Oral, resp. rate 19, height 5\' 9"  (1.753 m), weight 197 lb 12 oz (89.7 kg), SpO2 98 %.  General: Pleasant, NAD Psych: Normal affect. Neuro: Alert and oriented X 3. Moves all extremities spontaneously. HEENT: Normal  Neck: Supple without bruits. JVP 10-11 Lungs:  Resp regular and unlabored. Markedly decreased breath sound in bilateral bases, but no obvious rale or  wheezing.  Heart: RRR no s3, s4. +loud AV click Abdomen: Soft, non-tender, + mildly distended, BS + x 4.  Extremities: No clubbing, cyanosis. Trace edema in RLE, less in LLE. Faint pulse in LE  Labs  No results for input(s): CKTOTAL, CKMB, TROPONINI in the last 72 hours. Lab Results  Component Value Date   WBC 8.7 08/09/2014   HGB 11.4* 08/09/2014   HCT 34.6* 08/09/2014   MCV 94.0 08/09/2014   PLT 99* 08/09/2014    Recent Labs Lab 08/09/14 0745  NA 139  K 3.6  CL 100*  CO2 32  BUN 19  CREATININE 0.85  CALCIUM 8.1*  PROT 5.1*  BILITOT 1.5*  ALKPHOS 82  ALT 28  AST 28  GLUCOSE 85   Lab Results  Component Value Date   CHOL  03/29/2007    183        ATP III CLASSIFICATION:  <200     mg/dL   Desirable  200-239  mg/dL   Borderline High  >=240    mg/dL   High   HDL 50 03/29/2007   LDLCALC * 03/29/2007    106        Total Cholesterol/HDL:CHD Risk Coronary Heart Disease Risk Table                     Men   Women  1/2 Average Risk   3.4   3.3   TRIG 133 03/29/2007   No  results found for: DDIMER  Radiology/Studies  Dg Chest 2 View  08/08/2014   CLINICAL DATA:  Acute onset of shortness of breath and decreased O2 saturation. Initial encounter.  EXAM: CHEST  2 VIEW  COMPARISON:  Chest radiograph performed 03/11/2013  FINDINGS: Diffuse bibasilar airspace opacification, right greater than left, raises concern for multifocal pneumonia. Small bilateral pleural effusions are suspected. No pneumothorax is seen.  The heart is borderline enlarged. The patient is status post median sternotomy. A pacemaker/AICD is noted at the left chest wall, with leads ending at the right atrium, right ventricle and coronary sinus. No acute osseous abnormalities are seen.  IMPRESSION: Diffuse bibasilar airspace opacification, right greater than left, raises concern for multifocal pneumonia. Small bilateral pleural effusions suspected. Borderline cardiomegaly.   Electronically Signed   By: Garald Balding M.D.   On: 08/08/2014 19:19    ECG  ventricularly paced rhythm HR 80s (underlying AFL)  ASSESSMENT AND PLAN  1. Acute respiratory failure, under treatment for PNA  2. Acute on chronic systolic HF, mild  - weight has been stable, goal 194-197 despite pt states he is 10 lbs over  - he does have markedly decreased breath sound near bilateral bases, but no obvious rale. JVD up Trace edema on R, but not in LLE    - medtronic interrogated device impedance decreasing, fluid index approaching threshold  - BP low in 80-90s, coreg and lisinopril on hold. Will discuss with MD regarding lasix, he appears to be only mild fluid overloaded, but degree of HF unlikely to explain his symptom and weakness alone. Agree that PNA likely exacerbated his symptom. May consider 40mg  IV lasix and see response given interrogation result but need treat underlying infection at the same time. Hesitant to be too aggressive with diuresis given low BP  Note interrogation also showed 2 episodes of VT, longest one 4  min?  3. Likely PNA: CXR concerning for PNA, WBC 14.7, trending down  - BP low in 80-90s. Blood culture pending  4.  Persistent atrial fibrillation: TEE DCCV cancelled  - rate controlled now. However coreg held due to BP  5. ETOH abuse  6. h/o NICM with EF 20% s/p MDT CRT-D (device changed out 09/20/2012)  - normal cath 2009  7. h/o aortic root aneurysm 1989 8. s/p St Jude Mechanical AVR 9. LBBB   Signed, Almyra Deforest, Hershal Coria 08/09/2014, 11:06 AM  Patient seen and examined with Almyra Deforest, PA-C. We discussed all aspects of the encounter. I agree with the assessment and plan as stated above. ICD interrogation reviewed personally.   Suspect respiratory compromise likely multifactorial. Due to CHF and possible R>L PNA. CHF clearly related to development of recurrent AF/AFL several weeks ago but was unable to under DC-CV at that time as INR was subtherapeutic. ICD interrogation shows persistent AFL with rising fluid level. Will give 1 dose IV lasix tonight as BP tolerates. Agree with abx. Will plan TEE and DC-CV tomorrow as endo schedules permits.   Bensimhon, Daniel,MD 8:24 PM

## 2014-08-10 NOTE — Progress Notes (Addendum)
[facility] PROGRESS NOTE  Jon Lamp Kipp JGG:[id number] DOB: Oct 12, 1935 DOA: 08/08/2014 PCP: [name], MD   Chief Complaint: Shortness of breath Narrative: NAHIEM Butler is a 79 y.o. male with Past medical history of left bundle-branch block, chronic systolic CHF, LVEF 54%, aortic replacement with mechanical valve, chronic anticoagulation, atrial fibrillation, alcohol use., presented with complaints of shortness of breath. Wife reported the patient has been becoming more tired fatigue and lethargic and also has developed cough with sputum production, also had some shaking chills. Admitted with CAP and CHF  Assessment/Plan: 1. Acute hypoxic respiratory failure - multifactorial secondary to community-acquired pneumonia as well as a component of systolic CHF -Continue current antibiotic regimen-Rocephin/Zithromax, follow blood cultures -Blood pressure too soft to tolerate diuresis, resume Coreg and lisinopril when appropriate -Cards following, diuretics per Cards -Blood and sputum Cx negative  2. Acute on chronic systolic CHF/NICM with EF of 20% status post MDT CRT -D -as above  3. Persistent atrial fibrillation -For DCCV today -On amiodarone  4. Dysphagia/per Speech eval, has suspected esophageal dysphagia -check Esophogram, may need EGD -tolerating D3 diet  5. History of aortic mechanical valve ([employer]) -Continue warfarin   6. History of EtOH abuse - continue thiamine,   7.  acute on chronic thrombocytopenia  -Monitor for now   8. Chronic left bundle branch block   9. Mild cognitive  Dysfunction  -suspected to be due to alcohol use  DVt proph: on warfarin  Code Status: Full Code Family Communication: son at bedside Disposition Plan: home when stable   Consultants:  CHF  HPI/Subjective: Feels ok, very poor historian, reports breathing better Objective: Filed Vitals:   08/10/14 0800  BP: 117/76  Pulse: 73  Temp: 98 F (36.7 C)  Resp: 13     Intake/Output Summary (Last 24 hours) at 08/10/14 1155 Last data filed at 08/09/14 2012  Gross per 24 hour  Intake    510 ml  Output    300 ml  Net    210 ml   Filed Weights   08/08/14 1843 08/09/14 0036  Weight: 89.721 kg (197 lb 12.8 oz) 89.7 kg (197 lb 12 oz)    Exam:   General:  AAOx3, no distress  Cardiovascular: Y5K3/TWS, click noted  Respiratory: some basilar ronchi noted  Abdomen: soft, NT, BS present  Musculoskeletal: 1-plus pitting edema, extending up to knee   Data Reviewed: Basic Metabolic Panel:  Recent Labs Lab 08/08/14 1853 08/09/14 0745  NA 136 139  K 3.9 3.6  CL 95* 100*  CO2 31 32  GLUCOSE 147* 85  BUN 21* 19  CREATININE 1.28* 0.85  CALCIUM 8.7* 8.1*   Liver Function Tests:  Recent Labs Lab 08/08/14 2328 08/09/14 0745  AST 36 28  ALT 30 28  ALKPHOS 96 82  BILITOT 1.4* 1.5*  PROT 5.6* 5.1*  ALBUMIN 3.1* 2.9*   No results for input(s): LIPASE, AMYLASE in the last 168 hours. No results for input(s): AMMONIA in the last 168 hours. CBC:  Recent Labs Lab 08/08/14 1853 08/08/14 2328 08/09/14 0745  WBC 14.7*  --  8.7  NEUTROABS  --  11.7* 6.7  HGB 14.7  --  11.4*  HCT 44.5  --  34.6*  MCV 94.7  --  94.0  PLT 123*  --  99*   Cardiac Enzymes: No results for input(s): CKTOTAL, CKMB, CKMBINDEX, TROPONINI in the last 168 hours. BNP (last 3 results)  Recent Labs  08/09/14 0745  BNP 2479.2*  ProBNP (last 3 results) No results for input(s): PROBNP in the last 8760 hours.  CBG:  Recent Labs Lab 08/09/14 1757  GLUCAP 104*    Recent Results (from the past 240 hour(s))  Culture, blood (routine x 2)     Status: None (Preliminary result)   Collection Time: 08/08/14  9:10 PM  Result Value Ref Range Status   Specimen Description BLOOD LEFT ARM  Final   Special Requests BOTTLES DRAWN AEROBIC AND ANAEROBIC 5CC  Final   Culture NO GROWTH < 24 HOURS  Final   Report Status PENDING  Incomplete  Culture, blood (routine x 2)      Status: None (Preliminary result)   Collection Time: 08/08/14  9:15 PM  Result Value Ref Range Status   Specimen Description BLOOD RIGHT WRIST  Final   Special Requests BOTTLES DRAWN AEROBIC AND ANAEROBIC 5CC  Final   Culture NO GROWTH < 24 HOURS  Final   Report Status PENDING  Incomplete  MRSA PCR Screening     Status: None   Collection Time: 08/09/14  1:49 AM  Result Value Ref Range Status   MRSA by PCR NEGATIVE NEGATIVE Final    Comment:        The GeneXpert MRSA Assay (FDA approved for NASAL specimens only), is one component of a comprehensive MRSA colonization surveillance program. It is not intended to diagnose MRSA infection nor to guide or monitor treatment for MRSA infections.      Studies: Dg Chest 2 View  08/08/2014   CLINICAL DATA:  Acute onset of shortness of breath and decreased O2 saturation. Initial encounter.  EXAM: CHEST  2 VIEW  COMPARISON:  Chest radiograph performed 03/11/2013  FINDINGS: Diffuse bibasilar airspace opacification, right greater than left, raises concern for multifocal pneumonia. Small bilateral pleural effusions are suspected. No pneumothorax is seen.  The heart is borderline enlarged. The patient is status post median sternotomy. A pacemaker/AICD is noted at the left chest wall, with leads ending at the right atrium, right ventricle and coronary sinus. No acute osseous abnormalities are seen.  IMPRESSION: Diffuse bibasilar airspace opacification, right greater than left, raises concern for multifocal pneumonia. Small bilateral pleural effusions suspected. Borderline cardiomegaly.   Electronically Signed   By: Garald Balding M.D.   On: 08/08/2014 19:19   Dg Chest Port 1 View  08/10/2014   CLINICAL DATA:  Pleural effusion.  EXAM: PORTABLE CHEST - 1 VIEW  COMPARISON:  08/08/2014  FINDINGS: Sequelae of prior median sternotomy are again identified. Pacemaker/ICD remains in place. Cardiac silhouette is mildly enlarged. Thoracic aortic calcification is  noted. Bibasilar airspace opacities remain, slightly improved on the right and unchanged on the left compared to the prior study. No new airspace consolidation, sizeable pleural effusion, or pneumothorax is identified.  IMPRESSION: Persistent bibasilar infiltrates, slightly improved on the right in the interim.   Electronically Signed   By: Logan Bores   On: 08/10/2014 07:57   Dg Swallowing Func-speech Pathology  08/09/2014    Objective Swallowing Evaluation:    Patient Details  Name: BARACK NICODEMUS MRN: [id number] Date of Birth: Feb 25, 1935  Today's Date: 08/09/2014 Time: SLP Start Time (ACUTE ONLY): 1310-SLP Stop Time (ACUTE ONLY): 1337 SLP Time Calculation (min) (ACUTE ONLY): 27 min  Past Medical History:  Past Medical History  Diagnosis Date  . Left bundle-branch block   . Cardiomyopathy   . Chronic systolic heart failure   . Atrial fibrillation    Past Surgical History:  Past Surgical History  Procedure Laterality Date  . Doppler echocardiography  2008, 2009  . Aortic valve replacement    . Tee without cardioversion N/A 10/15/2012    Procedure: TRANSESOPHAGEAL ECHOCARDIOGRAM (TEE);  Surgeon: Fay Records,  MD;  Location[facility] ENDOSCOPY;  Service: Cardiovascular;  Laterality: N/A;  . Cardioversion N/A 10/25/2012    Procedure: CARDIOVERSION;  Surgeon: Evans Lance, MD;  Location: [place];  Service: Cardiovascular;  Laterality: N/A;  . Cardioversion N/A 12/20/2012    Procedure: CARDIOVERSION;  Surgeon: Larey Dresser, MD;  Location: [place];  Service: Cardiovascular;  Laterality: N/A;  . Implantable cardioverter defibrillator generator change N/A 09/20/2012    Procedure: IMPLANTABLE CARDIOVERTER DEFIBRILLATOR GENERATOR CHANGE;   Surgeon: Evans Lance, MD;  Location: [facility] CATH LAB;  Service:  Cardiovascular;  Laterality: N/A;   HPI:  Other Pertinent Information: EDGEL DEGNAN is a 79 y.o. male with Past  medical history of left bundle-branch block, chronic systolic CHF, LVEF  38%, aortic replacement with  mechanical valve, chronic anticoagulation,  atrial fibrillation, alcohol use.The patient is presenting with complaints  of shortness of breath cough as well as chills. Chest x-ray shows  multifocal pneumonia. He has leukocytosis as well as hypoxia and low-grade  temperature.  Pt with a vague history of esophageal dysphagia.   No Data Recorded  Assessment / Plan / Recommendation CHL IP CLINICAL IMPRESSIONS 08/09/2014  Therapy Diagnosis Suspected primary esophageal dysphagia  Clinical Impression Pt presents with a likely primary esophageal  dysphagia.  Oropharyngeal swallow was functional with mild hypopharyngeal  residue noted with all consistencies; trace penetration thin liquids noted  on one occasion.  Esophageal screen revealed presence of barium  accumulating in esophagus - regardless of consistency -and eventually  filling entire column; back flow noted.  Stasis remained through course of  study.  Recommend  esophageal w/u to elucidate source of deficit  For now,  continue POs with esophageal strategies. D/W Dr. Broadus John, pt/wife.         CHL IP TREATMENT RECOMMENDATION 08/09/2014  Treatment Recommendations Therapy as outlined in treatment plan below     CHL IP DIET RECOMMENDATION 08/09/2014  SLP Diet Recommendations Age appropriate regular solids;Thin  Liquid Administration via (None)  Medication Administration Crushed with puree  Compensations Slow rate;Small sips/bites  Postural Changes and/or Swallow Maneuvers (None)     CHL IP OTHER RECOMMENDATIONS 08/09/2014  Recommended Consults Consider GI evaluation;Consider esophageal assessment   Oral Care Recommendations Oral care BID  Other Recommendations (None)     No flowsheet data found.          SLP Swallow Goals No flowsheet data found.  No flowsheet data found.    CHL IP REASON FOR REFERRAL 08/09/2014  Reason for Referral Objectively evaluate swallowing function     CHL IP ORAL PHASE 08/09/2014  Lips (None)  Tongue (None)  Mucous membranes (None)  Nutritional status  (None)  Other (None)  Oxygen therapy (None)  Oral Phase WFL  Oral - Pudding Teaspoon (None)  Oral - Pudding Cup (None)  Oral - Honey Teaspoon (None)  Oral - Honey Cup (None)  Oral - Honey Syringe (None)  Oral - Nectar Teaspoon (None)  Oral - Nectar Cup (None)  Oral - Nectar Straw (None)  Oral - Nectar Syringe (None)  Oral - Ice Chips (None)  Oral - Thin Teaspoon (None)  Oral - Thin Cup (None)  Oral - Thin Straw (None)  Oral - Thin Syringe (None)  Oral - Puree (None)  Oral -  Mechanical Soft (None)  Oral - Regular (None)  Oral - Multi-consistency (None)  Oral - Pill (None)  Oral Phase - Comment (None)      CHL IP PHARYNGEAL PHASE 08/09/2014  Pharyngeal Phase Impaired  Pharyngeal - Pudding Teaspoon (None)  Penetration/Aspiration details (pudding teaspoon) (None)  Pharyngeal - Pudding Cup (None)  Penetration/Aspiration details (pudding cup) (None)  Pharyngeal - Honey Teaspoon (None)  Penetration/Aspiration details (honey teaspoon) (None)  Pharyngeal - Honey Cup (None)  Penetration/Aspiration details (honey cup) (None)  Pharyngeal - Honey Syringe (None)  Penetration/Aspiration details (honey syringe) (None)  Pharyngeal - Nectar Teaspoon (None)  Penetration/Aspiration details (nectar teaspoon) (None)  Pharyngeal - Nectar Cup (None)  Penetration/Aspiration details (nectar cup) (None)  Pharyngeal - Nectar Straw (None)  Penetration/Aspiration details (nectar straw) (None)  Pharyngeal - Nectar Syringe (None)  Penetration/Aspiration details (nectar syringe) (None)  Pharyngeal - Ice Chips (None)  Penetration/Aspiration details (ice chips) (None)  Pharyngeal - Thin Teaspoon (None)  Penetration/Aspiration details (thin teaspoon) (None)  Pharyngeal - Thin Cup (None)  Penetration/Aspiration details (thin cup) (None)  Pharyngeal - Thin Straw (None)  Penetration/Aspiration details (thin straw) (None)  Pharyngeal - Thin Syringe (None)  Penetration/Aspiration details (thin syringe') (None)  Pharyngeal - Puree (None)   Penetration/Aspiration details (puree) (None)  Pharyngeal - Mechanical Soft (None)  Penetration/Aspiration details (mechanical soft) (None)  Pharyngeal - Regular (None)  Penetration/Aspiration details (regular) (None)  Pharyngeal - Multi-consistency (None)  Penetration/Aspiration details (multi-consistency) (None)  Pharyngeal - Pill (None)  Penetration/Aspiration details (pill) (None)  Pharyngeal Comment (None)      CHL IP CERVICAL ESOPHAGEAL PHASE 08/09/2014  Cervical Esophageal Phase Impaired  Pudding Teaspoon (None)  Pudding Cup (None)  Honey Teaspoon (None)  Honey Cup (None)  Honey Straw (None)  Nectar Teaspoon (None)  Nectar Cup (None)  Nectar Straw (None)  Nectar Sippy Cup (None)  Thin Teaspoon (None)  Thin Cup (None)  Thin Straw (None)  Thin Sippy Cup (None)  Cervical Esophageal Comment see clinical impressions    No flowsheet data found.         Juan Quam Laurice 08/09/2014, 2:00 PM     Scheduled Meds: . allopurinol  300 mg Oral Daily  . amiodarone  400 mg Oral BID  . aspirin EC  81 mg Oral Daily  . azithromycin  500 mg Intravenous Q24H  . cefTRIAXone (ROCEPHIN)  IV  1 g Intravenous Q24H  . donepezil  23 mg Oral QHS  . furosemide  40 mg Intravenous BID  . guaifenesin  600 mg Oral 4 times per day  . memantine  28 mg Oral Daily  . potassium chloride  20 mEq Oral BID  . rosuvastatin  5 mg Oral Daily  . thiamine  100 mg Oral BID  . traZODone  400 mg Oral QHS  . Warfarin - Pharmacist Dosing Inpatient   Does not apply q1800   Continuous Infusions:  Antibiotics Given (last 72 hours)    Date/Time Action Medication Dose Rate   08/09/14 2012 Given   cefTRIAXone (ROCEPHIN) 1 g in dextrose 5 % 50 mL IVPB 1 g 100 mL/hr   08/09/14 2208 Given   azithromycin (ZITHROMAX) 500 mg in dextrose 5 % 250 mL IVPB 500 mg 250 mL/hr      Principal Problem:   Sepsis Active Problems:   Biventricular implantable cardioverter-defibrillator in situ   Atrial fibrillation   Acute on chronic systolic heart  failure   Long term current use of anticoagulant therapy   Community acquired pneumonia  Alcohol use    Time spent:85min    [facility]  [facility] Pager [phone]. If 7PM-7AM, please contact night-coverage at [email or web address], password [facility] 08/10/2014, 11:55 AM  LOS: 2 days

## 2014-08-10 NOTE — Progress Notes (Signed)
SLP Cancellation Note  Patient Details Name: KEVYN BOQUET MRN: [id number] DOB: 1935/02/23   Cancelled treatment:       Reason Eval/Treat Not Completed: Patient at procedure or test/unavailable TEE today, plan for esophagram at some point. SLP will f/u when esophagram complete.    Kayceon Oki, Katherene Ponto 08/10/2014, 10:22 AM

## 2014-08-10 NOTE — Progress Notes (Signed)
  Echocardiogram 2D Echocardiogram has been performed.  Jon Butler 08/10/2014, 2:41 PM

## 2014-08-10 NOTE — Op Note (Signed)
INDICATIONS: atrial fibrillation, precardioversion  PROCEDURE:   Informed consent was obtained prior to the procedure. The risks, benefits and alternatives for the procedure were discussed and the patient comprehended these risks.  Risks include, but are not limited to, cough, sore throat, vomiting, nausea, somnolence, esophageal and stomach trauma or perforation, bleeding, low blood pressure, aspiration, pneumonia, infection, trauma to the teeth and death.    After a procedural time-out, the oropharynx was anesthetized with 20% benzocaine spray. The patient was given IV propofol by Dr. Ola Spurr, Anesthesiology.  The transesophageal probe was inserted in the esophagus and stomach without difficulty and multiple views were obtained.  The patient was kept under observation until the patient left the procedure room.  The patient left the procedure room in stable condition.   Agitated microbubble saline contrast was not administered.  COMPLICATIONS:    There were no immediate complications.  FINDINGS:  No left atrial thrombus. Severely depressed LVEF. Dilated and severely depressed right ventricle. Biatrial dilatation. Normally functioning AV mechanical prosthesis. Mild to moderate MR  RECOMMENDATIONS:    Proceed with cardioversion.  Time Spent Directly with the Patient:  30 minutes   Jon Butler 08/10/2014, 3:02 PM

## 2014-08-10 NOTE — CV Procedure (Signed)
Procedure: Electrical Cardioversion Indications:  Atrial Fibrillation  Procedure Details:  Consent: Risks of procedure as well as the alternatives and risks of each were explained to the (patient/caregiver).  Consent for procedure obtained.  Time Out: Verified patient identification, verified procedure, site/side was marked, verified correct patient position, special equipment/implants available, medications/allergies/relevent history reviewed, required imaging and test results available.  Performed  Patient placed on cardiac monitor, pulse oximetry, supplemental oxygen as necessary.  Sedation given: IV propofol Pacer pads placed anterior and posterior chest.  Cardioverted 1 time(s).  Cardioversion with synchronized biphasic 150J shock.  Evaluation: Attempted burst overdrive pacing via the device at gradually decreasing atrial cycle lengths was unsuccessful. Post procedure ICD interrogation showed atrial paced ventricular paced rhythm, but there was early atrial fibrillation recurrence after about 2-3 minutes. Complications: None Patient did tolerate procedure well.  Time Spent Directly with the Patient:  30 minutes   Jon Butler 08/10/2014, 3:05 PM

## 2014-08-10 NOTE — Interval H&P Note (Signed)
History and Physical Interval Note:  08/10/2014 2:10 PM  Jon Butler  has presented today for surgery, with the diagnosis of atrial fibrillation  The various methods of treatment have been discussed with the patient and family. After consideration of risks, benefits and other options for treatment, the patient has consented to  Procedure(s): TRANSESOPHAGEAL ECHOCARDIOGRAM (TEE) (N/A) CARDIOVERSION (N/A) as a surgical intervention .  The patient's history has been reviewed, patient examined, no change in status, stable for surgery.  I have reviewed the patient's chart and labs.  Questions were answered to the patient's satisfaction.     [name]

## 2014-08-10 NOTE — Anesthesia Preprocedure Evaluation (Addendum)
Anesthesia Evaluation  Patient identified by MRN, date of birth, ID band Patient awake    Reviewed: Allergy & Precautions, NPO status , Patient's Chart, lab work & pertinent test results  Airway Mallampati: II   Neck ROM: Full    Dental  (+) Teeth Intact, Dental Advisory Given   Pulmonary pneumonia -, unresolved,  breath sounds clear to auscultation        Cardiovascular +CHF + dysrhythmias + Cardiac Defibrillator Rhythm:Irregular Rate:Normal  EF 20%   Neuro/Psych negative neurological ROS     GI/Hepatic negative GI ROS, Neg liver ROS,   Endo/Other  negative endocrine ROS  Renal/GU negative Renal ROS     Musculoskeletal   Abdominal   Peds  Hematology  (+) anemia ,   Anesthesia Other Findings   Reproductive/Obstetrics                           Anesthesia Physical Anesthesia Plan  ASA: III  Anesthesia Plan: MAC   Post-op Pain Management:    Induction: Intravenous  Airway Management Planned: Natural Airway and Nasal Cannula  Additional Equipment:   Intra-op Plan:   Post-operative Plan:   Informed Consent: I have reviewed the patients History and Physical, chart, labs and discussed the procedure including the risks, benefits and alternatives for the proposed anesthesia with the patient or authorized representative who has indicated his/her understanding and acceptance.     Plan Discussed with: CRNA  Anesthesia Plan Comments:         Anesthesia Quick Evaluation

## 2014-08-10 NOTE — Progress Notes (Addendum)
Advanced Heart Failure Rounding Note   Subjective:     Denies dyspnea. Remains weak. Weight unchanged.    Objective:   Weight Range:  Vital Signs:   Temp:  [97.6 F (36.4 C)-98.7 F (37.1 C)] 98 F (36.7 C) (08/04 0800) Pulse Rate:  [63-80] 73 (08/04 0421) Resp:  [14-22] 19 (08/04 0421) BP: (90-109)/(54-79) 100/74 mmHg (08/04 0421) SpO2:  [94 %-100 %] 94 % (08/04 0421) Last BM Date: 08/08/14  Weight change: Filed Weights   08/08/14 1843 08/09/14 0036  Weight: 89.721 kg (197 lb 12.8 oz) 89.7 kg (197 lb 12 oz)    Intake/Output:   Intake/Output Summary (Last 24 hours) at 08/10/14 1517 Last data filed at 08/09/14 2012  Gross per 24 hour  Intake    510 ml  Output    300 ml  Net    210 ml     Physical Exam: General: Pleasant, NAD Psych: Normal affect. Neuro: Alert and oriented X 3. Moves all extremities spontaneously. HEENT: Normal Neck: Supple without bruits. JVP 10-11 Lungs: Resp regular and unlabored. Crackles r base Heart: RRR no s3, s4. +loud AV click Abdomen: Soft, non-tender, + mildly distended, BS + x 4.  Extremities: No clubbing, cyanosis. 1-2+ edema  Telemetry: Underlying AFL + vpaced  Labs: Basic Metabolic Panel:  Recent Labs Lab 08/08/14 1853 08/09/14 0745  NA 136 139  K 3.9 3.6  CL 95* 100*  CO2 31 32  GLUCOSE 147* 85  BUN 21* 19  CREATININE 1.28* 0.85  CALCIUM 8.7* 8.1*    Liver Function Tests:  Recent Labs Lab 08/08/14 2328 08/09/14 0745  AST 36 28  ALT 30 28  ALKPHOS 96 82  BILITOT 1.4* 1.5*  PROT 5.6* 5.1*  ALBUMIN 3.1* 2.9*   No results for input(s): LIPASE, AMYLASE in the last 168 hours. No results for input(s): AMMONIA in the last 168 hours.  CBC:  Recent Labs Lab 08/08/14 1853 08/08/14 2328 08/09/14 0745  WBC 14.7*  --  8.7  NEUTROABS  --  11.7* 6.7  HGB 14.7  --  11.4*  HCT 44.5  --  34.6*  MCV 94.7  --  94.0  PLT 123*  --  99*    Cardiac Enzymes: No results for input(s): CKTOTAL, CKMB,  CKMBINDEX, TROPONINI in the last 168 hours.  BNP: BNP (last 3 results)  Recent Labs  08/09/14 0745  BNP 2479.2*    ProBNP (last 3 results) No results for input(s): PROBNP in the last 8760 hours.    Other results:  Imaging: Dg Chest 2 View  08/08/2014   CLINICAL DATA:  Acute onset of shortness of breath and decreased O2 saturation. Initial encounter.  EXAM: CHEST  2 VIEW  COMPARISON:  Chest radiograph performed 03/11/2013  FINDINGS: Diffuse bibasilar airspace opacification, right greater than left, raises concern for multifocal pneumonia. Small bilateral pleural effusions are suspected. No pneumothorax is seen.  The heart is borderline enlarged. The patient is status post median sternotomy. A pacemaker/AICD is noted at the left chest wall, with leads ending at the right atrium, right ventricle and coronary sinus. No acute osseous abnormalities are seen.  IMPRESSION: Diffuse bibasilar airspace opacification, right greater than left, raises concern for multifocal pneumonia. Small bilateral pleural effusions suspected. Borderline cardiomegaly.   Electronically Signed   By: Garald Balding M.D.   On: 08/08/2014 19:19   Dg Chest Port 1 View  08/10/2014   CLINICAL DATA:  Pleural effusion.  EXAM: PORTABLE CHEST - 1 VIEW  COMPARISON:  08/08/2014  FINDINGS: Sequelae of prior median sternotomy are again identified. Pacemaker/ICD remains in place. Cardiac silhouette is mildly enlarged. Thoracic aortic calcification is noted. Bibasilar airspace opacities remain, slightly improved on the right and unchanged on the left compared to the prior study. No new airspace consolidation, sizeable pleural effusion, or pneumothorax is identified.  IMPRESSION: Persistent bibasilar infiltrates, slightly improved on the right in the interim.   Electronically Signed   By: Logan Bores   On: 08/10/2014 07:57   Dg Swallowing Func-speech Pathology  08/09/2014    Objective Swallowing Evaluation:    Patient Details  Name: Jon Butler MRN: [id number] Date of Birth: 16-Apr-1935  Today's Date: 08/09/2014 Time: SLP Start Time (ACUTE ONLY): 1310-SLP Stop Time (ACUTE ONLY): 1337 SLP Time Calculation (min) (ACUTE ONLY): 27 min  Past Medical History:  Past Medical History  Diagnosis Date  . Left bundle-branch block   . Cardiomyopathy   . Chronic systolic heart failure   . Atrial fibrillation    Past Surgical History:  Past Surgical History  Procedure Laterality Date  . Doppler echocardiography  2008, 2009  . Aortic valve replacement    . Tee without cardioversion N/A 10/15/2012    Procedure: TRANSESOPHAGEAL ECHOCARDIOGRAM (TEE);  Surgeon: Fay Records,  MD;  Location: [facility] ENDOSCOPY;  Service: Cardiovascular;  Laterality: N/A;  . Cardioversion N/A 10/25/2012    Procedure: CARDIOVERSION;  Surgeon: Evans Lance, MD;  Location: [place];  Service: Cardiovascular;  Laterality: N/A;  . Cardioversion N/A 12/20/2012    Procedure: CARDIOVERSION;  Surgeon: Larey Dresser, MD;  Location: [place];  Service: Cardiovascular;  Laterality: N/A;  . Implantable cardioverter defibrillator generator change N/A 09/20/2012    Procedure: IMPLANTABLE CARDIOVERTER DEFIBRILLATOR GENERATOR CHANGE;   Surgeon: Evans Lance, MD;  Location: [facility] CATH LAB;  Service:  Cardiovascular;  Laterality: N/A;   HPI:  Other Pertinent Information: Jon Butler is a 79 y.o. male with Past  medical history of left bundle-branch block, chronic systolic CHF, LVEF  85%, aortic replacement with mechanical valve, chronic anticoagulation,  atrial fibrillation, alcohol use.The patient is presenting with complaints  of shortness of breath cough as well as chills. Chest x-ray shows  multifocal pneumonia. He has leukocytosis as well as hypoxia and low-grade  temperature.  Pt with a vague history of esophageal dysphagia.   No Data Recorded  Assessment / Plan / Recommendation CHL IP CLINICAL IMPRESSIONS 08/09/2014  Therapy Diagnosis Suspected primary esophageal dysphagia  Clinical Impression Pt  presents with a likely primary esophageal  dysphagia.  Oropharyngeal swallow was functional with mild hypopharyngeal  residue noted with all consistencies; trace penetration thin liquids noted  on one occasion.  Esophageal screen revealed presence of barium  accumulating in esophagus - regardless of consistency -and eventually  filling entire column; back flow noted.  Stasis remained through course of  study.  Recommend  esophageal w/u to elucidate source of deficit  For now,  continue POs with esophageal strategies. D/W Dr. Broadus John, pt/wife.         CHL IP TREATMENT RECOMMENDATION 08/09/2014  Treatment Recommendations Therapy as outlined in treatment plan below     CHL IP DIET RECOMMENDATION 08/09/2014  SLP Diet Recommendations Age appropriate regular solids;Thin  Liquid Administration via (None)  Medication Administration Crushed with puree  Compensations Slow rate;Small sips/bites  Postural Changes and/or Swallow Maneuvers (None)     CHL IP OTHER RECOMMENDATIONS 08/09/2014  Recommended Consults Consider GI evaluation;Consider esophageal assessment   Oral  Care Recommendations Oral care BID  Other Recommendations (None)     No flowsheet data found.          SLP Swallow Goals No flowsheet data found.  No flowsheet data found.    CHL IP REASON FOR REFERRAL 08/09/2014  Reason for Referral Objectively evaluate swallowing function     CHL IP ORAL PHASE 08/09/2014  Lips (None)  Tongue (None)  Mucous membranes (None)  Nutritional status (None)  Other (None)  Oxygen therapy (None)  Oral Phase WFL  Oral - Pudding Teaspoon (None)  Oral - Pudding Cup (None)  Oral - Honey Teaspoon (None)  Oral - Honey Cup (None)  Oral - Honey Syringe (None)  Oral - Nectar Teaspoon (None)  Oral - Nectar Cup (None)  Oral - Nectar Straw (None)  Oral - Nectar Syringe (None)  Oral - Ice Chips (None)  Oral - Thin Teaspoon (None)  Oral - Thin Cup (None)  Oral - Thin Straw (None)  Oral - Thin Syringe (None)  Oral - Puree (None)  Oral - Mechanical Soft (None)   Oral - Regular (None)  Oral - Multi-consistency (None)  Oral - Pill (None)  Oral Phase - Comment (None)      CHL IP PHARYNGEAL PHASE 08/09/2014  Pharyngeal Phase Impaired  Pharyngeal - Pudding Teaspoon (None)  Penetration/Aspiration details (pudding teaspoon) (None)  Pharyngeal - Pudding Cup (None)  Penetration/Aspiration details (pudding cup) (None)  Pharyngeal - Honey Teaspoon (None)  Penetration/Aspiration details (honey teaspoon) (None)  Pharyngeal - Honey Cup (None)  Penetration/Aspiration details (honey cup) (None)  Pharyngeal - Honey Syringe (None)  Penetration/Aspiration details (honey syringe) (None)  Pharyngeal - Nectar Teaspoon (None)  Penetration/Aspiration details (nectar teaspoon) (None)  Pharyngeal - Nectar Cup (None)  Penetration/Aspiration details (nectar cup) (None)  Pharyngeal - Nectar Straw (None)  Penetration/Aspiration details (nectar straw) (None)  Pharyngeal - Nectar Syringe (None)  Penetration/Aspiration details (nectar syringe) (None)  Pharyngeal - Ice Chips (None)  Penetration/Aspiration details (ice chips) (None)  Pharyngeal - Thin Teaspoon (None)  Penetration/Aspiration details (thin teaspoon) (None)  Pharyngeal - Thin Cup (None)  Penetration/Aspiration details (thin cup) (None)  Pharyngeal - Thin Straw (None)  Penetration/Aspiration details (thin straw) (None)  Pharyngeal - Thin Syringe (None)  Penetration/Aspiration details (thin syringe') (None)  Pharyngeal - Puree (None)  Penetration/Aspiration details (puree) (None)  Pharyngeal - Mechanical Soft (None)  Penetration/Aspiration details (mechanical soft) (None)  Pharyngeal - Regular (None)  Penetration/Aspiration details (regular) (None)  Pharyngeal - Multi-consistency (None)  Penetration/Aspiration details (multi-consistency) (None)  Pharyngeal - Pill (None)  Penetration/Aspiration details (pill) (None)  Pharyngeal Comment (None)      CHL IP CERVICAL ESOPHAGEAL PHASE 08/09/2014  Cervical Esophageal Phase Impaired  Pudding Teaspoon (None)   Pudding Cup (None)  Honey Teaspoon (None)  Honey Cup (None)  Honey Straw (None)  Nectar Teaspoon (None)  Nectar Cup (None)  Nectar Straw (None)  Nectar Sippy Cup (None)  Thin Teaspoon (None)  Thin Cup (None)  Thin Straw (None)  Thin Sippy Cup (None)  Cervical Esophageal Comment see clinical impressions    No flowsheet data found.         Juan Quam Laurice 08/09/2014, 2:00 PM       Medications:     Scheduled Medications: . allopurinol  300 mg Oral Daily  . amiodarone  400 mg Oral BID  . aspirin EC  81 mg Oral Daily  . azithromycin  500 mg Intravenous Q24H  . cefTRIAXone (ROCEPHIN)  IV  1 g Intravenous Q24H  .  donepezil  23 mg Oral QHS  . furosemide  40 mg Intravenous Once  . guaifenesin  600 mg Oral 4 times per day  . memantine  28 mg Oral Daily  . rosuvastatin  5 mg Oral Daily  . thiamine  100 mg Oral BID  . traZODone  400 mg Oral QHS  . Warfarin - Pharmacist Dosing Inpatient   Does not apply q1800     Infusions:     PRN Medications:  fluticasone   Assessment:   1. Acute respiratory failure - multifactorial 2. Acute on chronic systolic HF   --NICM EF 31% 3. Recurrent atrial flutter/fib 4. PNA 5. Mechanical AVR  Plan/Discussion:     Suspect respiratory compromise likely multifactorial. Due to CHF and possible R>L PNA. CHF clearly related to development of recurrent AF/AFL several weeks ago but was unable to under DC-CV at that time as INR was subtherapeutic. ICD interrogation shows persistent AFL with rising fluid level.   Will start IV lasix as BP tolerates. Agree with abx. Repeat CXR in am tomorrow to look for clearing of infiltrates.   Plan TEE and DC-CV at 2pm. Given that rhythm is AFL may try overdrive pacing first if TEE negative for LAA clot. INR now therapeutic  Length of Stay: 2   Leira Regino 08/10/2014, 9:24 AM  Advanced Heart Failure Team Pager [phone] (M-F; 7a - 4p)  Please contact [facility] for night-coverage after hours (4p -7a )  and weekends on [email or web address]

## 2014-08-10 NOTE — Progress Notes (Signed)
Per request from CHF team, TEE/DCCV scheduled for 2pm today. Dayna Dunn PA-C

## 2014-08-10 NOTE — Transfer of Care (Signed)
Immediate Anesthesia Transfer of Care Note  Patient: Jon Butler  Procedure(s) Performed: Procedure(s): TRANSESOPHAGEAL ECHOCARDIOGRAM (TEE) (N/A) CARDIOVERSION (N/A)  Patient Location: Endoscopy Unit  Anesthesia Type:MAC  Level of Consciousness: awake, alert  and oriented  Airway & Oxygen Therapy: Patient Spontanous Breathing and Patient connected to nasal cannula oxygen  Post-op Assessment: Report given to RN, Post -op Vital signs reviewed and stable and Patient moving all extremities X 4  Post vital signs: Reviewed and stable  Last Vitals:  Filed Vitals:   08/10/14 1523  BP: 120/87  Pulse:   Temp:   Resp:     Complications: No apparent anesthesia complications

## 2014-08-11 ENCOUNTER — Inpatient Hospital Stay (HOSPITAL_COMMUNITY): Payer: Medicare Other

## 2014-08-11 ENCOUNTER — Encounter (HOSPITAL_COMMUNITY): Payer: Self-pay | Admitting: Cardiovascular Disease

## 2014-08-11 DIAGNOSIS — R1310 Dysphagia, unspecified: Secondary | ICD-10-CM | POA: Diagnosis present

## 2014-08-11 LAB — BASIC METABOLIC PANEL
Anion gap: 11 (ref 5–15)
BUN: 18 mg/dL (ref 6–20)
CALCIUM: 8.6 mg/dL — AB (ref 8.9–10.3)
CO2: 32 mmol/L (ref 22–32)
Chloride: 96 mmol/L — ABNORMAL LOW (ref 101–111)
Creatinine, Ser: 0.99 mg/dL (ref 0.61–1.24)
GLUCOSE: 115 mg/dL — AB (ref 65–99)
Potassium: 4.3 mmol/L (ref 3.5–5.1)
Sodium: 139 mmol/L (ref 135–145)

## 2014-08-11 LAB — PROTIME-INR
INR: 2.87 — ABNORMAL HIGH (ref 0.00–1.49)
PROTHROMBIN TIME: 29.6 s — AB (ref 11.6–15.2)

## 2014-08-11 MED ORDER — FUROSEMIDE 10 MG/ML IJ SOLN
40.0000 mg | Freq: Two times a day (BID) | INTRAMUSCULAR | Status: DC
  Administered 2014-08-11 – 2014-08-14 (×7): 40 mg via INTRAVENOUS
  Filled 2014-08-11 (×10): qty 4

## 2014-08-11 MED ORDER — LORAZEPAM 2 MG/ML IJ SOLN
0.5000 mg | Freq: Four times a day (QID) | INTRAMUSCULAR | Status: DC | PRN
  Administered 2014-08-11 – 2014-08-20 (×6): 0.5 mg via INTRAVENOUS
  Filled 2014-08-11 (×7): qty 1

## 2014-08-11 MED ORDER — WARFARIN SODIUM 1 MG PO TABS
1.5000 mg | ORAL_TABLET | Freq: Once | ORAL | Status: DC
  Filled 2014-08-11: qty 1

## 2014-08-11 MED ORDER — FUROSEMIDE 10 MG/ML IJ SOLN
40.0000 mg | Freq: Once | INTRAMUSCULAR | Status: AC
  Administered 2014-08-11: 40 mg via INTRAVENOUS
  Filled 2014-08-11: qty 4

## 2014-08-11 MED ORDER — AMIODARONE HCL IN DEXTROSE 360-4.14 MG/200ML-% IV SOLN
30.0000 mg/h | INTRAVENOUS | Status: DC
  Administered 2014-08-11 – 2014-08-12 (×2): 30 mg/h via INTRAVENOUS
  Filled 2014-08-11 (×8): qty 200

## 2014-08-11 MED ORDER — POTASSIUM CHLORIDE 10 MEQ/100ML IV SOLN
10.0000 meq | INTRAVENOUS | Status: AC
  Administered 2014-08-11 (×3): 10 meq via INTRAVENOUS
  Filled 2014-08-11 (×3): qty 100

## 2014-08-11 NOTE — Progress Notes (Addendum)
PHARMACY NOTE  Consult :  Coumadin>>hold, Heparin when INR < 2 Indication :  atrial fibrillation, AVR  Dosing Wt :  89.7 kg  LABS :  Recent Labs  08/08/14 1853 08/09/14 0312 08/09/14 0745 08/10/14 0234 08/11/14 0322  HGB 14.7  --  11.4*  --   --   HCT 44.5  --  34.6*  --   --   PLT 123*  --  99*  --   --   LABPROT 29.5* 32.6*  --  35.6* 29.6*  INR 2.86* 3.27*  --  3.67* 2.87*  CREATININE 1.28*  --  0.85  --   --     MEDICATION: Medication PTA: Medication Sig  . amiodarone (PACERONE) 400 MG tablet Take 1 tablet (400 mg total) by mouth 2 (two) times daily.  Marland Kitchen warfarin (COUMADIN) 2 MG tablet Take as directed by coumadin clinic (Patient taking differently: Take 2-3 mg by mouth daily at 6 PM. Take 2 mg daily except on Tuesday and Saturday. Take 3 mg on Tuesday and Saturday)   Scheduled:  Scheduled:  . allopurinol  300 mg Oral Daily  . amiodarone  400 mg Oral BID  . aspirin EC  81 mg Oral Daily  . azithromycin  500 mg Intravenous Q24H  . cefTRIAXone (ROCEPHIN)  IV  1 g Intravenous Q24H  . donepezil  23 mg Oral QHS  . guaifenesin  600 mg Oral 4 times per day  . memantine  28 mg Oral Daily  . rosuvastatin  5 mg Oral Daily  . sodium chloride  3 mL Intravenous Q12H  . thiamine  100 mg Oral BID  . traZODone  400 mg Oral QHS  . Warfarin - Pharmacist Dosing Inpatient   Does not apply q1800   Antibiotic[s]: Anti-infectives    Start     Dose/Rate Route Frequency Ordered Stop   08/08/14 2300  cefTRIAXone (ROCEPHIN) 1 g in dextrose 5 % 50 mL IVPB     1 g 100 mL/hr over 30 Minutes Intravenous Every 24 hours 08/08/14 2257 08/15/14 1959   08/08/14 2300  azithromycin (ZITHROMAX) 500 mg in dextrose 5 % 250 mL IVPB     500 mg 250 mL/hr over 60 Minutes Intravenous Every 24 hours 08/08/14 2257 08/15/14 2159   08/08/14 2100  cefTRIAXone (ROCEPHIN) 1 g in dextrose 5 % 50 mL IVPB  Status:  Discontinued     1 g 100 mL/hr over 30 Minutes Intravenous Every 24  hours 08/08/14 2059 08/09/14 0039   08/08/14 2100  azithromycin (ZITHROMAX) 500 mg in dextrose 5 % 250 mL IVPB  Status:  Discontinued     500 mg 250 mL/hr over 60 Minutes Intravenous Every 24 hours 08/08/14 2059 08/09/14 0039      ASSESSMENT :  79 y.o. male is currently on chronic Coumadin for atrial fibrillation and AVR.  Coumadin held yesterday.   Today's INR down to 2.87 falling within therapeutic range set by his MD.  No evidence of bleeding complications observed.  GOAL :  TARGET INR 2-3   PLAN : 1. Coumadin 1.5 mg po today. 2. Daily INR's, CBC. Monitor for bleeding complications.   Follow Platelet counts.  Marthenia Rolling,  Pharm.D   08/11/2014,  9:02 AM     Addendum: Patient is NPO. Pharmacy asked to hold coumadin and start heparin when INR < 2. No reversal agents given.  Plan: 1) Follow up daily INR and begin heparin when < 2  Nena Jordan, PharmD, BCPS 08/11/2014, 12:57  PM

## 2014-08-11 NOTE — Consult Note (Signed)
Referring Provider:  Dr. Eleonore Chiquito ([facility]) Primary Care Physician:  [name], MD Primary Gastroenterologist:  None (unassigned)  Reason for Consultation:  Dysphagia, abnormal barium swallow  HPI: Jon Butler is a 79 y.o. male admitted to the hospital with hypoxic respiratory failure felt to be due to community acquired pneumonia superimposed on a severe ischemic cardiomyopathy with an estimated ejection fraction of about 20%, maintained on chronic anticoagulation for a prosthetic aortic valve replacement more than 20 years ago. There is a history of progressive dementia and medical illness, for which reason his cardiologist, who has followed him for many years, is suggesting consideration of a palliative care consult with determination of goals of care.  With that background, there is a long-standing history of progressive dysphagia to food and liquids, and in the hospital, it was noted that he was having trouble swallowing so a couple of days ago, a swallowing evaluation was done which showed fairly adequate oropharyngeal function, but evidence of inadequate emptying of the esophagus.   This prompted a formal barium swallow which was performed today, and which I have reviewed. It shows a sharp cutoff of the barium column at the GE junction, with minimal flow of barium through a tiny strand into the stomach. The cutoff of the barium column at the GE junction is smooth and there is a hint of a "bird's beak," and the proximal esophagus is quite patulous, all of which would suggest the possibility of achalasia.   The patient has not had any significant weight loss, and denies any problem with reflux or heartburn symptoms.   Past Medical History  Diagnosis Date  . Left bundle-branch block   . Cardiomyopathy   . Chronic systolic heart failure   . Atrial fibrillation     Past Surgical History  Procedure Laterality Date  . Doppler echocardiography  2008, 2009  . Aortic valve  replacement    . Tee without cardioversion N/A 10/15/2012    Procedure: TRANSESOPHAGEAL ECHOCARDIOGRAM (TEE);  Surgeon: Fay Records, MD;  Location: [facility] ENDOSCOPY;  Service: Cardiovascular;  Laterality: N/A;  . Cardioversion N/A 10/25/2012    Procedure: CARDIOVERSION;  Surgeon: Evans Lance, MD;  Location: [place];  Service: Cardiovascular;  Laterality: N/A;  . Cardioversion N/A 12/20/2012    Procedure: CARDIOVERSION;  Surgeon: Larey Dresser, MD;  Location: [place];  Service: Cardiovascular;  Laterality: N/A;  . Implantable cardioverter defibrillator generator change N/A 09/20/2012    Procedure: IMPLANTABLE CARDIOVERTER DEFIBRILLATOR GENERATOR CHANGE;  Surgeon: Evans Lance, MD;  Location: [facility] CATH LAB;  Service: Cardiovascular;  Laterality: N/A;  . Tee without cardioversion N/A 08/10/2014    Procedure: TRANSESOPHAGEAL ECHOCARDIOGRAM (TEE);  Surgeon: Sanda Klein, MD;  Location: [place];  Service: Cardiovascular;  Laterality: N/A;  . Cardioversion N/A 08/10/2014    Procedure: CARDIOVERSION;  Surgeon: Sanda Klein, MD;  Location: [facility] ENDOSCOPY;  Service: Cardiovascular;  Laterality: N/A;    Prior to Admission medications   Medication Sig Start Date End Date Taking? Authorizing Provider  allopurinol (ZYLOPRIM) 300 MG tablet Take 300 mg by mouth daily.    Yes Historical Provider, MD  amiodarone (PACERONE) 400 MG tablet Take 1 tablet (400 mg total) by mouth 2 (two) times daily. 08/02/14  Yes Jolaine Artist, MD  amoxicillin (AMOXIL) 500 MG capsule Take 500 mg by mouth every other day.    Yes Historical Provider, MD  aspirin EC 81 MG tablet Take 1 tablet (81 mg total) by mouth daily. 10/26/12  Yes Brooke  O Edmisten, PA-C  carvedilol (COREG) 3.125 MG tablet Take 2 tablets (6.25 mg total) by mouth 2 (two) times daily with a meal. 09/08/13  Yes Evans Lance, MD  Coenzyme Q10 (CO Q-10 PO) Take 1 tablet by mouth daily.    Yes Historical Provider, MD  colchicine 0.6 MG tablet Take 0.6 mg by  mouth daily as needed (gout pain).    Yes Historical Provider, MD  donepezil (ARICEPT) 23 MG TABS tablet Take 1 tablet (23 mg total) by mouth at bedtime. 04/20/14  Yes Penni Bombard, MD  fluticasone (FLONASE) 50 MCG/ACT nasal spray Place 2 sprays into both nostrils daily as needed for allergies or rhinitis.  08/17/13  Yes Historical Provider, MD  furosemide (LASIX) 40 MG tablet Take 1 tablet (40 mg total) by mouth daily. 05/24/14  Yes Shaune Pascal Bensimhon, MD  lisinopril (PRINIVIL,ZESTRIL) 10 MG tablet Take 0.5 tablets (5 mg total) by mouth at bedtime. 07/07/14  Yes Evans Lance, MD  memantine (NAMENDA XR) 28 MG CP24 24 hr capsule Take 1 capsule (28 mg total) by mouth daily. 04/20/14  Yes Penni Bombard, MD  metolazone (ZAROXOLYN) 2.5 MG tablet Take 1 tablet (2.5 mg total) by mouth daily. 06/21/14  Yes Larey Dresser, MD  Multiple Vitamins-Minerals (MULTIVITAMIN WITH MINERALS) tablet Take 1 tablet by mouth daily.    Yes Historical Provider, MD  potassium chloride SA (K-DUR,KLOR-CON) 20 MEQ tablet Take 1 tablet (20 mEq total) by mouth daily. Take 1 tab when you take Metolazone 06/21/14  Yes Larey Dresser, MD  rosuvastatin (CRESTOR) 10 MG tablet Take 5 mg by mouth daily.    Yes Historical Provider, MD  spironolactone (ALDACTONE) 25 MG tablet Take 0.5 tablets (12.5 mg total) by mouth daily. 10/27/12  Yes Evans Lance, MD  thiamine (VITAMIN B-1) 100 MG tablet Take 1 tablet (100 mg total) by mouth 2 (two) times daily. 12/16/12  Yes Amy D Clegg, NP  traZODone (DESYREL) 100 MG tablet Take 400 mg by mouth at bedtime. 06/29/14  Yes Historical Provider, MD  warfarin (COUMADIN) 2 MG tablet Take as directed by coumadin clinic Patient taking differently: Take 2-3 mg by mouth daily at 6 PM. Take 2 mg daily except on Tuesday and Saturday. Take 3 mg on Tuesday and Saturday 01/05/14  Yes Evans Lance, MD    Current Facility-Administered Medications  Medication Dose Route Frequency Provider Last Rate Last  Dose  . 0.9 %  sodium chloride infusion  250 mL Intravenous Continuous Dayna N Dunn, PA-C      . allopurinol (ZYLOPRIM) tablet 300 mg  300 mg Oral Daily Lavina Hamman, MD   300 mg at 08/10/14 1000  . amiodarone (NEXTERONE PREMIX) 360 MG/200ML (1.8 mg/mL) IV infusion  30 mg/hr Intravenous Continuous Larey Dresser, MD 16.7 mL/hr at 08/11/14 1500 30 mg/hr at 08/11/14 1500  . aspirin EC tablet 81 mg  81 mg Oral Daily Lavina Hamman, MD   81 mg at 08/10/14 1000  . azithromycin (ZITHROMAX) 500 mg in dextrose 5 % 250 mL IVPB  500 mg Intravenous Q24H Lavina Hamman, MD   500 mg at 08/10/14 2323  . cefTRIAXone (ROCEPHIN) 1 g in dextrose 5 % 50 mL IVPB  1 g Intravenous Q24H Lavina Hamman, MD   1 g at 08/11/14 2131  . donepezil (ARICEPT) tablet 23 mg  23 mg Oral QHS Lavina Hamman, MD   23 mg at 08/10/14 2149  . fluticasone (FLONASE)  50 MCG/ACT nasal spray 2 spray  2 spray Each Nare Daily PRN Lavina Hamman, MD      . furosemide (LASIX) injection 40 mg  40 mg Intravenous BID Larey Dresser, MD   40 mg at 08/11/14 1719  . guaifenesin (ROBITUSSIN) 100 MG/5ML syrup 600 mg  600 mg Oral 4 times per day Domenic Polite, MD   600 mg at 08/10/14 1853  . LORazepam (ATIVAN) injection 0.5 mg  0.5 mg Intravenous Q6H PRN Ritta Slot, NP   0.5 mg at 08/11/14 2131  . memantine (NAMENDA XR) 24 hr capsule 28 mg  28 mg Oral Daily Lavina Hamman, MD   28 mg at 08/10/14 1000  . rosuvastatin (CRESTOR) tablet 5 mg  5 mg Oral Daily Lavina Hamman, MD   5 mg at 08/10/14 1000  . sodium chloride 0.9 % injection 3 mL  3 mL Intravenous Q12H Dayna N Dunn, PA-C   3 mL at 08/10/14 2151  . sodium chloride 0.9 % injection 3 mL  3 mL Intravenous PRN Dayna N Dunn, PA-C      . thiamine (VITAMIN B-1) tablet 100 mg  100 mg Oral BID Lavina Hamman, MD   100 mg at 08/10/14 2149  . traZODone (DESYREL) tablet 400 mg  400 mg Oral QHS Lavina Hamman, MD   400 mg at 08/10/14 2150    Allergies as of 08/08/2014  . (No Known Allergies)    Family  History  Problem Relation Age of Onset  . Aneurysm Mother     brain  . Heart attack Father 77    History   Social History  . Marital Status: Married    Spouse Name: [name]  . Number of Children: 5  . Years of Education: 12   Occupational History  . retired    Social History Main Topics  . Smoking status: Never Smoker   . Smokeless tobacco: Never Used  . Alcohol Use: 0.0 oz/week    2-3 Glasses of wine per week     Comment: 2-3 glasses  . Drug Use: No  . Sexual Activity: No   Other Topics Concern  . Not on file   Social History Narrative   Patient is married [name]).   Patient is retired.   5 children    12th grade   2 cups caffeine daily    Review of Systems: See history of present illness. At the present time, the patient indicates that his breathing is quite comfortable.  Physical Exam: Vital signs in last 24 hours: Temp:  [97 F (36.1 C)-98.7 F (37.1 C)] 97.6 F (36.4 C) (08/05 2026) Pulse Rate:  [58-71] 68 (08/05 2026) Resp:  [16-20] 19 (08/05 2026) BP: (117-125)/(72-83) 119/76 mmHg (08/05 2026) SpO2:  [97 %-100 %] 99 % (08/05 2026) Last BM Date: 08/08/14 General:   Alert,  Well-developed, well-nourished, pleasant and cooperative in NAD. He is sitting in a bedside chair. His mentation seems to be somewhat slow and he forgets things that were just told him, consistent with a history of some degree of dementia, although it is rather subtle and casual conversation. The patient is in no respiratory distress whatsoever. Head:  Normocephalic and atraumatic. Eyes:  Sclera clear, no icterus.   Conjunctiva pink. Mouth:   No ulcerations or lesions.  Oropharynx pink & moist. Neck:   No masses or thyromegaly. Lungs:  Clear throughout to auscultation.   No wheezes, crackles, or rhonchi. No evident respiratory distress. Heart:  The rate is well controlled. He has crisp prosthetic mechanical heart valve sounds. Abdomen:  Soft, nontender, nontympanitic, and  nondistended.  Msk:   Symmetrical without gross deformities. Pulses:  Normal radial pulse is noted. Extremities:   Without clubbing, cyanosis, or edema. Neurologic:  No focal abnormalities. The patient does appear to have some degree of organic brain syndrome, but is nonetheless overall quite coherent. Skin:  Intact without significant lesions or rashes. Cervical Nodes:  No significant cervical adenopathy. Psych:   Alert and cooperative. Normal mood and affect.  Intake/Output from previous day: 08/04 0701 - 08/05 0700 In: 700 [I.V.:150; IV Piggyback:550] Out: 200 [Urine:200] Intake/Output this shift:    Lab Results:  Recent Labs  08/09/14 0745  WBC 8.7  HGB 11.4*  HCT 34.6*  PLT 99*   BMET  Recent Labs  08/09/14 0745 08/11/14 1439  NA 139 139  K 3.6 4.3  CL 100* 96*  CO2 32 32  GLUCOSE 85 115*  BUN 19 18  CREATININE 0.85 0.99  CALCIUM 8.1* 8.6*   LFT  Recent Labs  08/08/14 2328 08/09/14 0745  PROT 5.6* 5.1*  ALBUMIN 3.1* 2.9*  AST 36 28  ALT 30 28  ALKPHOS 96 82  BILITOT 1.4* 1.5*  BILIDIR 0.5  --   IBILI 0.9  --    PT/INR  Recent Labs  08/10/14 0234 08/11/14 0322  LABPROT 35.6* 29.6*  INR 3.67* 2.87*    Studies/Results: Dg Esophagus  08/11/2014   CLINICAL DATA:  Esophageal dysmotility identified on modified barium swallow.  EXAM: ESOPHOGRAM/BARIUM SWALLOW  TECHNIQUE: Combined double contrast and single contrast examination performed using thin liquid barium.  FLUOROSCOPY TIME:  Radiation Exposure Index (as provided by the fluoroscopic device):  If the device does not provide the exposure index:  Fluoroscopy Time:  1 minutes 35 seconds  Number of Acquired Images:  91  COMPARISON:  CT 10/20/2012  FINDINGS: Exam is limited due to patient immobility. Oral contrast was administered by mouth to the patient in a supine in 30 degree position. Contrast flowed into the mildly distended esophagus. Contrast was restricted at the distal esophagus. Only a thin  stream of contrast flowed through the distal esophagus. Majority of the oral contrast remains within the esophagus which is mildly tortuous. There is no retained food stuff within the esophagus.  IMPRESSION: 1. No significant flow of contrast through the distal esophagus. Only a thin string of contrast flowed through to the stomach. There is abrupt narrowing of the distal esophagus. There is no food stuff within the distal esophagus. Findings suggest distal esophageal spasm. Cannot exclude esophageal mass however less likely with no retained food. Consider endoscopy.   Electronically Signed   By: Suzy Bouchard M.D.   On: 08/11/2014 10:43   Dg Chest Port 1 View  08/10/2014   CLINICAL DATA:  Pleural effusion.  EXAM: PORTABLE CHEST - 1 VIEW  COMPARISON:  08/08/2014  FINDINGS: Sequelae of prior median sternotomy are again identified. Pacemaker/ICD remains in place. Cardiac silhouette is mildly enlarged. Thoracic aortic calcification is noted. Bibasilar airspace opacities remain, slightly improved on the right and unchanged on the left compared to the prior study. No new airspace consolidation, sizeable pleural effusion, or pneumothorax is identified.  IMPRESSION: Persistent bibasilar infiltrates, slightly improved on the right in the interim.   Electronically Signed   By: Logan Bores   On: 08/10/2014 07:57   Dg Chest Port 1v Same Day  08/11/2014   CLINICAL DATA:  Congestive heart failure/cardiomyopathy  EXAM: PORTABLE CHEST - 1 VIEW SAME DAY  COMPARISON:  August 10, 2014  FINDINGS: There is patchy bibasilar atelectatic type change. There is trace interstitial edema, marginally less than on the study from 1 day prior. No new opacity. Heart is slightly enlarged with pulmonary vascularity within normal limits. Pacemaker leads are attached to the right atrium, right ventricle, and left ventricle. There is atherosclerotic change in aorta. No adenopathy. No pneumothorax.  IMPRESSION: Trace interstitial edema remains,  slightly less than 1 day prior. There is patchy bibasilar atelectasis. No new opacity. Heart prominent but stable. Suspect mild residual congestive heart failure.   Electronically Signed   By: Lowella Grip III M.D.   On: 08/11/2014 07:44    Impression: 1. Probable achalasia. Doubt cancer, given the absence of weight loss despite fairly prolonged symptoms, and based on the appearance of the esophageal narrowing on his barium swallow. 2. Severe comorbidities including severe CHF (EF 20%), chronic anticoagulation, and progressive overall decline in health status.  Plan: I think we have several options. One is to try to get by with oral feeding, which has certainly worked adequately up to the present time, emphasizing upright posture and frequent swallows of liquids while eating, to help food empty into the esophagus with the help of gravity. The next option would be placement of a feeding tube. The third option would be endoscopy with Botox injections at the lower esophageal sphincter. Each of these has advantages and disadvantages, which I will plan to discuss in more detail tomorrow with the patient and his wife, who seems very attentive and cognizant of the patient's condition.   LOS: 3 days   Jedrick Hutcherson,[place]  08/11/2014, 9:50 PM   Pager [phone] If no answer or after 5 PM call [phone]

## 2014-08-11 NOTE — Care Management Important Message (Signed)
Important Message  Patient Details  Name: COY ROCHFORD MRN: [id number] Date of Birth: 01/01/1936   Medicare Important Message Given:  Yes-second notification given    Nathen May 08/11/2014, 11:35 AMImportant Message  Patient Details  Name: HERALD VALLIN MRN: [id number] Date of Birth: 1935/03/07   Medicare Important Message Given:  Yes-second notification given    Nathen May 08/11/2014, 11:35 AM

## 2014-08-11 NOTE — Progress Notes (Signed)
[facility] PROGRESS NOTE  Jon Butler ZOX:[id number] DOB: 1935-12-08 DOA: 08/08/2014 PCP: [name], MD  Assessment/Plan: 1. Acute hypoxic respiratory failure- multifactorial due to community acquired pneumonia as well as systolic CHF. Improving, continue Rocephin and Zithromax. Started on IV Lasix. Follow BMP in a.m. 2. 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 Persistent atrial fibrillation- status post DCCV, patient remained in A. Fib. Cardiology to attempt to DCCV next week. Continue amiodarone 4. Dysphagia- patient underwent Esophogram, which showed distal esophageal obstruction versus spasm. Will consult GI 5. History of mechanical aortic valve- patient currently on warfarin, will start IV heparin when INR less than 2 6. History of alcohol abuse- continue thiamine 7. Acute on chronic thrombocytopenia- platelet count was 99 on 08/09/2014, check CBC in a.m. 8. DVT prophylaxis- continue warfarin  Code Status: Full code Family Communication: *No family at bedside Disposition Plan: To be decided   Consultants:  Cardiology   Procedures:  Esophogram  Antibiotics:  None  HPI/Subjective: 79 y.o. male with Past medical history of left bundle-branch block, chronic systolic CHF, LVEF 81%, aortic replacement with mechanical valve, chronic anticoagulation, atrial fibrillation, alcohol use., presented with complaints of shortness of breath. Wife reported the patient has been becoming more tired fatigue and lethargic and also has developed cough with sputum production, also had some shaking chills.Admitted with CAP and CHF. Patient had a choking episode last night, Esophogram done this morning shows possible distal esophageal mass versus spasm. Patient is currently nothing by mouth, denies any chest pain or shortness of breath.  Objective: Filed Vitals:   08/11/14 1200  BP: 117/72  Pulse: 67  Temp: 98.7 F  (37.1 C)  Resp: 18    Intake/Output Summary (Last 24 hours) at 08/11/14 1344 Last data filed at 08/10/14 2323  Gross per 24 hour  Intake    700 ml  Output      0 ml  Net    700 ml   Filed Weights   08/08/14 1843 08/09/14 0036  Weight: 89.721 kg (197 lb 12.8 oz) 89.7 kg (197 lb 12 oz)    Exam:   General:  Appears in no acute distress    Cardiovascular: S1-S2 regular  Respiratory: Clear to auscultation bilaterally  Abdomen: Soft, nontender, no organomegaly  Musculoskeletal: Bilateral 1+ edema of the lower extremities  Data Reviewed: Basic Metabolic Panel:  Recent Labs Lab 08/08/14 1853 08/09/14 0745  NA 136 139  K 3.9 3.6  CL 95* 100*  CO2 31 32  GLUCOSE 147* 85  BUN 21* 19  CREATININE 1.28* 0.85  CALCIUM 8.7* 8.1*   Liver Function Tests:  Recent Labs Lab 08/08/14 2328 08/09/14 0745  AST 36 28  ALT 30 28  ALKPHOS 96 82  BILITOT 1.4* 1.5*  PROT 5.6* 5.1*  ALBUMIN 3.1* 2.9*   No results for input(s): LIPASE, AMYLASE in the last 168 hours. No results for input(s): AMMONIA in the last 168 hours. CBC:  Recent Labs Lab 08/08/14 1853 08/08/14 2328 08/09/14 0745  WBC 14.7*  --  8.7  NEUTROABS  --  11.7* 6.7  HGB 14.7  --  11.4*  HCT 44.5  --  34.6*  MCV 94.7  --  94.0  PLT 123*  --  99*   Cardiac Enzymes: No results for input(s): CKTOTAL, CKMB, CKMBINDEX, TROPONINI in the last 168 hours. BNP (last 3 results)  Recent Labs  08/09/14 0745  BNP 2479.2*    ProBNP (last 3  results) No results for input(s): PROBNP in the last 8760 hours.  CBG:  Recent Labs Lab 08/09/14 1757  GLUCAP 104*    Recent Results (from the past 240 hour(s))  Culture, blood (routine x 2)     Status: None (Preliminary result)   Collection Time: 08/08/14  9:10 PM  Result Value Ref Range Status   Specimen Description BLOOD LEFT ARM  Final   Special Requests BOTTLES DRAWN AEROBIC AND ANAEROBIC 5CC  Final   Culture NO GROWTH 3 DAYS  Final   Report Status PENDING   Incomplete  Culture, blood (routine x 2)     Status: None (Preliminary result)   Collection Time: 08/08/14  9:15 PM  Result Value Ref Range Status   Specimen Description BLOOD RIGHT WRIST  Final   Special Requests BOTTLES DRAWN AEROBIC AND ANAEROBIC 5CC  Final   Culture NO GROWTH 3 DAYS  Final   Report Status PENDING  Incomplete  MRSA PCR Screening     Status: None   Collection Time: 08/09/14  1:49 AM  Result Value Ref Range Status   MRSA by PCR NEGATIVE NEGATIVE Final    Comment:        The GeneXpert MRSA Assay (FDA approved for NASAL specimens only), is one component of a comprehensive MRSA colonization surveillance program. It is not intended to diagnose MRSA infection nor to guide or monitor treatment for MRSA infections.      Studies: Dg Esophagus  08/11/2014   CLINICAL DATA:  Esophageal dysmotility identified on modified barium swallow.  EXAM: ESOPHOGRAM/BARIUM SWALLOW  TECHNIQUE: Combined double contrast and single contrast examination performed using thin liquid barium.  FLUOROSCOPY TIME:  Radiation Exposure Index (as provided by the fluoroscopic device):  If the device does not provide the exposure index:  Fluoroscopy Time:  1 minutes 35 seconds  Number of Acquired Images:  91  COMPARISON:  CT 10/20/2012  FINDINGS: Exam is limited due to patient immobility. Oral contrast was administered by mouth to the patient in a supine in 30 degree position. Contrast flowed into the mildly distended esophagus. Contrast was restricted at the distal esophagus. Only a thin stream of contrast flowed through the distal esophagus. Majority of the oral contrast remains within the esophagus which is mildly tortuous. There is no retained food stuff within the esophagus.  IMPRESSION: 1. No significant flow of contrast through the distal esophagus. Only a thin string of contrast flowed through to the stomach. There is abrupt narrowing of the distal esophagus. There is no food stuff within the distal  esophagus. Findings suggest distal esophageal spasm. Cannot exclude esophageal mass however less likely with no retained food. Consider endoscopy.   Electronically Signed   By: Suzy Bouchard M.D.   On: 08/11/2014 10:43   Dg Chest Port 1 View  08/10/2014   CLINICAL DATA:  Pleural effusion.  EXAM: PORTABLE CHEST - 1 VIEW  COMPARISON:  08/08/2014  FINDINGS: Sequelae of prior median sternotomy are again identified. Pacemaker/ICD remains in place. Cardiac silhouette is mildly enlarged. Thoracic aortic calcification is noted. Bibasilar airspace opacities remain, slightly improved on the right and unchanged on the left compared to the prior study. No new airspace consolidation, sizeable pleural effusion, or pneumothorax is identified.  IMPRESSION: Persistent bibasilar infiltrates, slightly improved on the right in the interim.   Electronically Signed   By: Logan Bores   On: 08/10/2014 07:57   Dg Chest Port 1v Same Day  08/11/2014   CLINICAL DATA:  Congestive heart failure/cardiomyopathy  EXAM: PORTABLE CHEST - 1 VIEW SAME DAY  COMPARISON:  August 10, 2014  FINDINGS: There is patchy bibasilar atelectatic type change. There is trace interstitial edema, marginally less than on the study from 1 day prior. No new opacity. Heart is slightly enlarged with pulmonary vascularity within normal limits. Pacemaker leads are attached to the right atrium, right ventricle, and left ventricle. There is atherosclerotic change in aorta. No adenopathy. No pneumothorax.  IMPRESSION: Trace interstitial edema remains, slightly less than 1 day prior. There is patchy bibasilar atelectasis. No new opacity. Heart prominent but stable. Suspect mild residual congestive heart failure.   Electronically Signed   By: Lowella Grip III M.D.   On: 08/11/2014 07:44    Scheduled Meds: . allopurinol  300 mg Oral Daily  . aspirin EC  81 mg Oral Daily  . azithromycin  500 mg Intravenous Q24H  . cefTRIAXone (ROCEPHIN)  IV  1 g Intravenous Q24H   . donepezil  23 mg Oral QHS  . furosemide  40 mg Intravenous BID  . guaifenesin  600 mg Oral 4 times per day  . memantine  28 mg Oral Daily  . potassium chloride  10 mEq Intravenous Q1 Hr x 3  . rosuvastatin  5 mg Oral Daily  . sodium chloride  3 mL Intravenous Q12H  . thiamine  100 mg Oral BID  . traZODone  400 mg Oral QHS   Continuous Infusions: . sodium chloride    . amiodarone 30 mg/hr (08/11/14 1320)    Principal Problem:   Sepsis Active Problems:   Biventricular implantable cardioverter-defibrillator in situ   Atrial fibrillation   Acute on chronic systolic heart failure   Long term current use of anticoagulant therapy   Community acquired pneumonia   Alcohol use    Time spent: 25 min    [facility]  [facility] Pager [phone]*. If 7PM-7AM, please contact night-coverage at [email or web address], password [facility] 08/11/2014, 1:44 PM  LOS: 3 days

## 2014-08-11 NOTE — Progress Notes (Signed)
Pt had a episode of choking after taking med crush in [place]. Pt had also had a drink of diet soda. The pt was able to cough up a thick brown sputum. There was not apparence of pills in emeses. Pt resp are 20 and pox is 98 on 2l O2. MD paged. Pt has no wheezing. Pt is able to cough and clear airway with out difficulty. Will continue to monitor pt.

## 2014-08-11 NOTE — Progress Notes (Signed)
SUBJECTIVE: The patient denies chest pain or shortness of breath.   CURRENT MEDICATIONS: . allopurinol  300 mg Oral Daily  . aspirin EC  81 mg Oral Daily  . azithromycin  500 mg Intravenous Q24H  . cefTRIAXone (ROCEPHIN)  IV  1 g Intravenous Q24H  . donepezil  23 mg Oral QHS  . furosemide  40 mg Intravenous BID  . guaifenesin  600 mg Oral 4 times per day  . memantine  28 mg Oral Daily  . rosuvastatin  5 mg Oral Daily  . sodium chloride  3 mL Intravenous Q12H  . thiamine  100 mg Oral BID  . traZODone  400 mg Oral QHS   . sodium chloride    . amiodarone 30 mg/hr (08/11/14 1500)    OBJECTIVE: Physical Exam: Filed Vitals:   08/11/14 0340 08/11/14 0720 08/11/14 1200 08/11/14 1514  BP: 119/77 120/76 117/72 123/73  Pulse: 58 64 67   Temp: 97.5 F (36.4 C) 97.7 F (36.5 C) 98.7 F (37.1 C) 98.5 F (36.9 C)  TempSrc: Oral Oral Oral Oral  Resp: 17 16 18    Height:      Weight:      SpO2: 100% 100% 100% 100%    Intake/Output Summary (Last 24 hours) at 08/11/14 1646 Last data filed at 08/11/14 1510  Gross per 24 hour  Intake    750 ml  Output      0 ml  Net    750 ml    Telemetry reveals atypical atrial flutter with ventricular pacing  GEN- The patient is well appearing, alert and oriented x 3 today.   Head- normocephalic, atraumatic Eyes-  Sclera clear, conjunctiva pink Ears- hearing intact Oropharynx- clear Neck- supple  Lungs- Clear to ausculation bilaterally, normal work of breathing Heart- Regular rate and rhythm (paced) GI- soft, NT, ND, + BS Extremities- no clubbing, cyanosis, or edema Skin- no rash or lesion Psych- euthymic mood, full affect Neuro- strength and sensation are intact  LABS: Basic Metabolic Panel:  Recent Labs  08/09/14 0745 08/11/14 1439  NA 139 139  K 3.6 4.3  CL 100* 96*  CO2 32 32  GLUCOSE 85 115*  BUN 19 18  CREATININE 0.85 0.99  CALCIUM 8.1* 8.6*   Liver Function Tests:  Recent Labs  08/08/14 2328 08/09/14 0745    AST 36 28  ALT 30 28  ALKPHOS 96 82  BILITOT 1.4* 1.5*  PROT 5.6* 5.1*  ALBUMIN 3.1* 2.9*   CBC:  Recent Labs  08/08/14 1853 08/08/14 2328 08/09/14 0745  WBC 14.7*  --  8.7  NEUTROABS  --  11.7* 6.7  HGB 14.7  --  11.4*  HCT 44.5  --  34.6*  MCV 94.7  --  94.0  PLT 123*  --  99*    RADIOLOGY: Dg Chest 2 View 08/08/2014   CLINICAL DATA:  Acute onset of shortness of breath and decreased O2 saturation. Initial encounter.  EXAM: CHEST  2 VIEW  COMPARISON:  Chest radiograph performed 03/11/2013  FINDINGS: Diffuse bibasilar airspace opacification, right greater than left, raises concern for multifocal pneumonia. Small bilateral pleural effusions are suspected. No pneumothorax is seen.  The heart is borderline enlarged. The patient is status post median sternotomy. A pacemaker/AICD is noted at the left chest wall, with leads ending at the right atrium, right ventricle and coronary sinus. No acute osseous abnormalities are seen.  IMPRESSION: Diffuse bibasilar airspace opacification, right greater than left, raises concern for multifocal pneumonia.  Small bilateral pleural effusions suspected. Borderline cardiomegaly.   Electronically Signed   By: Garald Balding M.D.   On: 08/08/2014 19:19   ASSESSMENT AND PLAN:  Principal Problem:   Sepsis Active Problems:   Biventricular implantable cardioverter-defibrillator in situ   Atrial fibrillation   Acute on chronic systolic heart failure   Long term current use of anticoagulant therapy   Community acquired pneumonia   Alcohol use   Dysphagia  1.  Atrial flutter Failed DCCV despite amiodarone Likelihood of maintaining SR at this point is low Would discontinue amiodarone and not pursue further rhythm control for now  2.  NICM/acute on chronic systolic heart failure Volume status improving Dr Lovena Le discussed tachy therapies with patient and his son today. Will disable tachy therapies Would recommend palliative care insult for goals of care  discussion  3.  [facility] GI work up pending NPO for now  [employer], NP 08/11/2014 4:50 PM  EP Attending  Patient seen and examined. Agree with above findings. I have known the patient for many years and watched his dementia worsen and he acquire multiple comorbidities. He is now near the end of his illness and I have recommended that we turn off tachy therapies, continue to treat the patient medically and get him discharged. We will have his tachy therapies turned off. We can stop IV amiodarone.   Mikle Bosworth.D.

## 2014-08-11 NOTE — Progress Notes (Signed)
Advanced Heart Failure Rounding Note   Subjective:   Admitted with recurrent A fib and volume overload.   Events  08/09/14- MBS- likely esophageal dysphagia  08/10/14-S/P TEE No thrombus, RV/LV severely depressed, Mild -Mod MR. Attempted over drive pace x3. Remained in Afib.  Episode of choking  08/11/14 - Esophagram completed. Weight unchanged. NPO  Denies SOB   Objective:   Weight Range:  Vital Signs:   Temp:  [97 F (36.1 C)-98.2 F (36.8 C)] 97.7 F (36.5 C) (08/05 0720) Pulse Rate:  [58-75] 64 (08/05 0720) Resp:  [15-21] 16 (08/05 0720) BP: (93-125)/(51-87) 120/76 mmHg (08/05 0720) SpO2:  [94 %-100 %] 100 % (08/05 0720) Last BM Date: 08/08/14  Weight change: Filed Weights   08/08/14 1843 08/09/14 0036  Weight: 197 lb 12.8 oz (89.721 kg) 197 lb 12 oz (89.7 kg)    Intake/Output:   Intake/Output Summary (Last 24 hours) at 08/11/14 1034 Last data filed at 08/10/14 2323  Gross per 24 hour  Intake    700 ml  Output    200 ml  Net    500 ml     Physical Exam: General: Pleasant, NAD Psych: Normal affect. Neuro: Alert and oriented X 3. Moves all extremities spontaneously. HEENT: Normal Neck: Supple without bruits. JVP 10-11 Lungs: Resp regular and unlabored. Decreased in bases.  Heart: RRR no s3, s4. +loud AV click Abdomen: Soft, non-tender, + mildly distended, BS + x 4.  Extremities: No clubbing, cyanosis. 1-2+ edema  Telemetry: VPaced  70s   Labs: Basic Metabolic Panel:  Recent Labs Lab 08/08/14 1853 08/09/14 0745  NA 136 139  K 3.9 3.6  CL 95* 100*  CO2 31 32  GLUCOSE 147* 85  BUN 21* 19  CREATININE 1.28* 0.85  CALCIUM 8.7* 8.1*    Liver Function Tests:  Recent Labs Lab 08/08/14 2328 08/09/14 0745  AST 36 28  ALT 30 28  ALKPHOS 96 82  BILITOT 1.4* 1.5*  PROT 5.6* 5.1*  ALBUMIN 3.1* 2.9*   No results for input(s): LIPASE, AMYLASE in the last 168 hours. No results for input(s): AMMONIA in the last 168 hours.  CBC:  Recent  Labs Lab 08/08/14 1853 08/08/14 2328 08/09/14 0745  WBC 14.7*  --  8.7  NEUTROABS  --  11.7* 6.7  HGB 14.7  --  11.4*  HCT 44.5  --  34.6*  MCV 94.7  --  94.0  PLT 123*  --  99*    Cardiac Enzymes: No results for input(s): CKTOTAL, CKMB, CKMBINDEX, TROPONINI in the last 168 hours.  BNP: BNP (last 3 results)  Recent Labs  08/09/14 0745  BNP 2479.2*    ProBNP (last 3 results) No results for input(s): PROBNP in the last 8760 hours.    Other results:  Imaging: Dg Chest Port 1 View  08/10/2014   CLINICAL DATA:  Pleural effusion.  EXAM: PORTABLE CHEST - 1 VIEW  COMPARISON:  08/08/2014  FINDINGS: Sequelae of prior median sternotomy are again identified. Pacemaker/ICD remains in place. Cardiac silhouette is mildly enlarged. Thoracic aortic calcification is noted. Bibasilar airspace opacities remain, slightly improved on the right and unchanged on the left compared to the prior study. No new airspace consolidation, sizeable pleural effusion, or pneumothorax is identified.  IMPRESSION: Persistent bibasilar infiltrates, slightly improved on the right in the interim.   Electronically Signed   By: Logan Bores   On: 08/10/2014 07:57   Dg Chest Port 1v Same Day  08/11/2014   CLINICAL DATA:  Congestive heart failure/cardiomyopathy  EXAM: PORTABLE CHEST - 1 VIEW SAME DAY  COMPARISON:  August 10, 2014  FINDINGS: There is patchy bibasilar atelectatic type change. There is trace interstitial edema, marginally less than on the study from 1 day prior. No new opacity. Heart is slightly enlarged with pulmonary vascularity within normal limits. Pacemaker leads are attached to the right atrium, right ventricle, and left ventricle. There is atherosclerotic change in aorta. No adenopathy. No pneumothorax.  IMPRESSION: Trace interstitial edema remains, slightly less than 1 day prior. There is patchy bibasilar atelectasis. No new opacity. Heart prominent but stable. Suspect mild residual congestive heart  failure.   Electronically Signed   By: Lowella Grip III M.D.   On: 08/11/2014 07:44   Dg Swallowing Func-speech Pathology  08/09/2014    Objective Swallowing Evaluation:    Patient Details  Name: TREVAUN RENDLEMAN MRN: [id number] Date of Birth: 1935/10/03  Today's Date: 08/09/2014 Time: SLP Start Time (ACUTE ONLY): 1310-SLP Stop Time (ACUTE ONLY): 1337 SLP Time Calculation (min) (ACUTE ONLY): 27 min  Past Medical History:  Past Medical History  Diagnosis Date  . Left bundle-branch block   . Cardiomyopathy   . Chronic systolic heart failure   . Atrial fibrillation    Past Surgical History:  Past Surgical History  Procedure Laterality Date  . Doppler echocardiography  2008, 2009  . Aortic valve replacement    . Tee without cardioversion N/A 10/15/2012    Procedure: TRANSESOPHAGEAL ECHOCARDIOGRAM (TEE);  Surgeon: Fay Records,  MD;  Location: [facility] ENDOSCOPY;  Service: Cardiovascular;  Laterality: N/A;  . Cardioversion N/A 10/25/2012    Procedure: CARDIOVERSION;  Surgeon: Evans Lance, MD;  Location: [place];  Service: Cardiovascular;  Laterality: N/A;  . Cardioversion N/A 12/20/2012    Procedure: CARDIOVERSION;  Surgeon: Larey Dresser, MD;  Location: [place];  Service: Cardiovascular;  Laterality: N/A;  . Implantable cardioverter defibrillator generator change N/A 09/20/2012    Procedure: IMPLANTABLE CARDIOVERTER DEFIBRILLATOR GENERATOR CHANGE;   Surgeon: Evans Lance, MD;  Location: [facility] CATH LAB;  Service:  Cardiovascular;  Laterality: N/A;   HPI:  Other Pertinent Information: ESTILL LLERENA is a 79 y.o. male with Past  medical history of left bundle-branch block, chronic systolic CHF, LVEF  57%, aortic replacement with mechanical valve, chronic anticoagulation,  atrial fibrillation, alcohol use.The patient is presenting with complaints  of shortness of breath cough as well as chills. Chest x-ray shows  multifocal pneumonia. He has leukocytosis as well as hypoxia and low-grade  temperature.  Pt with a vague  history of esophageal dysphagia.   No Data Recorded  Assessment / Plan / Recommendation CHL IP CLINICAL IMPRESSIONS 08/09/2014  Therapy Diagnosis Suspected primary esophageal dysphagia  Clinical Impression Pt presents with a likely primary esophageal  dysphagia.  Oropharyngeal swallow was functional with mild hypopharyngeal  residue noted with all consistencies; trace penetration thin liquids noted  on one occasion.  Esophageal screen revealed presence of barium  accumulating in esophagus - regardless of consistency -and eventually  filling entire column; back flow noted.  Stasis remained through course of  study.  Recommend  esophageal w/u to elucidate source of deficit  For now,  continue POs with esophageal strategies. D/W Dr. Broadus John, pt/wife.         CHL IP TREATMENT RECOMMENDATION 08/09/2014  Treatment Recommendations Therapy as outlined in treatment plan below     CHL IP DIET RECOMMENDATION 08/09/2014  SLP Diet Recommendations Age appropriate regular solids;Thin  Liquid Administration via (None)  Medication Administration Crushed with puree  Compensations Slow rate;Small sips/bites  Postural Changes and/or Swallow Maneuvers (None)     CHL IP OTHER RECOMMENDATIONS 08/09/2014  Recommended Consults Consider GI evaluation;Consider esophageal assessment   Oral Care Recommendations Oral care BID  Other Recommendations (None)     No flowsheet data found.          SLP Swallow Goals No flowsheet data found.  No flowsheet data found.    CHL IP REASON FOR REFERRAL 08/09/2014  Reason for Referral Objectively evaluate swallowing function     CHL IP ORAL PHASE 08/09/2014  Lips (None)  Tongue (None)  Mucous membranes (None)  Nutritional status (None)  Other (None)  Oxygen therapy (None)  Oral Phase WFL  Oral - Pudding Teaspoon (None)  Oral - Pudding Cup (None)  Oral - Honey Teaspoon (None)  Oral - Honey Cup (None)  Oral - Honey Syringe (None)  Oral - Nectar Teaspoon (None)  Oral - Nectar Cup (None)  Oral - Nectar Straw (None)  Oral -  Nectar Syringe (None)  Oral - Ice Chips (None)  Oral - Thin Teaspoon (None)  Oral - Thin Cup (None)  Oral - Thin Straw (None)  Oral - Thin Syringe (None)  Oral - Puree (None)  Oral - Mechanical Soft (None)  Oral - Regular (None)  Oral - Multi-consistency (None)  Oral - Pill (None)  Oral Phase - Comment (None)      CHL IP PHARYNGEAL PHASE 08/09/2014  Pharyngeal Phase Impaired  Pharyngeal - Pudding Teaspoon (None)  Penetration/Aspiration details (pudding teaspoon) (None)  Pharyngeal - Pudding Cup (None)  Penetration/Aspiration details (pudding cup) (None)  Pharyngeal - Honey Teaspoon (None)  Penetration/Aspiration details (honey teaspoon) (None)  Pharyngeal - Honey Cup (None)  Penetration/Aspiration details (honey cup) (None)  Pharyngeal - Honey Syringe (None)  Penetration/Aspiration details (honey syringe) (None)  Pharyngeal - Nectar Teaspoon (None)  Penetration/Aspiration details (nectar teaspoon) (None)  Pharyngeal - Nectar Cup (None)  Penetration/Aspiration details (nectar cup) (None)  Pharyngeal - Nectar Straw (None)  Penetration/Aspiration details (nectar straw) (None)  Pharyngeal - Nectar Syringe (None)  Penetration/Aspiration details (nectar syringe) (None)  Pharyngeal - Ice Chips (None)  Penetration/Aspiration details (ice chips) (None)  Pharyngeal - Thin Teaspoon (None)  Penetration/Aspiration details (thin teaspoon) (None)  Pharyngeal - Thin Cup (None)  Penetration/Aspiration details (thin cup) (None)  Pharyngeal - Thin Straw (None)  Penetration/Aspiration details (thin straw) (None)  Pharyngeal - Thin Syringe (None)  Penetration/Aspiration details (thin syringe') (None)  Pharyngeal - Puree (None)  Penetration/Aspiration details (puree) (None)  Pharyngeal - Mechanical Soft (None)  Penetration/Aspiration details (mechanical soft) (None)  Pharyngeal - Regular (None)  Penetration/Aspiration details (regular) (None)  Pharyngeal - Multi-consistency (None)  Penetration/Aspiration details (multi-consistency) (None)   Pharyngeal - Pill (None)  Penetration/Aspiration details (pill) (None)  Pharyngeal Comment (None)      CHL IP CERVICAL ESOPHAGEAL PHASE 08/09/2014  Cervical Esophageal Phase Impaired  Pudding Teaspoon (None)  Pudding Cup (None)  Honey Teaspoon (None)  Honey Cup (None)  Honey Straw (None)  Nectar Teaspoon (None)  Nectar Cup (None)  Nectar Straw (None)  Nectar Sippy Cup (None)  Thin Teaspoon (None)  Thin Cup (None)  Thin Straw (None)  Thin Sippy Cup (None)  Cervical Esophageal Comment see clinical impressions    No flowsheet data found.         Juan Quam Laurice 08/09/2014, 2:00 PM      Medications:     Scheduled Medications: . allopurinol  300 mg Oral Daily  . amiodarone  400 mg Oral BID  . aspirin EC  81 mg Oral Daily  . azithromycin  500 mg Intravenous Q24H  . cefTRIAXone (ROCEPHIN)  IV  1 g Intravenous Q24H  . donepezil  23 mg Oral QHS  . guaifenesin  600 mg Oral 4 times per day  . memantine  28 mg Oral Daily  . rosuvastatin  5 mg Oral Daily  . sodium chloride  3 mL Intravenous Q12H  . thiamine  100 mg Oral BID  . traZODone  400 mg Oral QHS  . warfarin  1.5 mg Oral ONCE-1800  . Warfarin - Pharmacist Dosing Inpatient   Does not apply q1800    Infusions: . sodium chloride      PRN Medications: fluticasone, sodium chloride   Assessment:   1. Acute respiratory failure - multifactorial 2. Acute on chronic systolic HF   --NICM EF 25% 3. Recurrent atrial flutter/fib 4. PNA 5. Mechanical AVR 6. Suspect Esophageal dysphagia   Plan/Discussion:   Suspect respiratory compromise likely multifactorial.   Currently NPO due to swallowing difficulties. Waiting on results of esophagram. For now hold off on bb, ace, spiro. Volume status elevated. Give 40 mg IV lasix now.   Due to CHF and possible R>L PNA. CHF clearly related to development of recurrent AF/AFL several weeks. Had TEE but unsuccessful overdrive pacing. 3 attempts made. Rate controled .  INR 2.87 Pharmacy dosing Coumadin.    On antibiotics for PNA.   Needs to address HF meds after swallowing issues figured out.   Length of Stay: 3   CLEGG,AMY NP-C  08/11/2014, 10:34 AM  Advanced Heart Failure Team Pager [phone] (M-F; [place])  Please contact [facility] for night-coverage after hours (4p -7a ) and weekends on [email or web address]  Patient seen with NP, agree with the above note.  Esophagram done today, results below. 1. No significant flow of contrast through the distal esophagus. Only a thin string of contrast flowed through to the stomach. There is abrupt narrowing of the distal esophagus. There is no food stuff within the distal esophagus. Findings suggest distal esophageal spasm. Cannot exclude esophageal mass however less likely with no retained food. Consider endoscopy.  Possible esophageal spasm? No trouble reported with TEE yesterday.  Will arrange for GI evaluation, looks like he needs EGD.  He will remain NPO for now.   As NPO, will make amiodarone IV.  Will need IV heparin gtt when INR falls close to 2 if still NPO.  When he is taking po again, will use ranolazine as adjunct to amiodarone with additional attempt at [place] in a week or two.   Patient tolerates atrial fibrillation poorly.  He is volume overloaded.  No BMET available today.  - Lasix 40 mg IV bid today.  - BMET today and in am - Will give KCl.  Loralie Champagne 08/11/2014 12:58 PM

## 2014-08-12 LAB — CBC
HCT: 38.9 % — ABNORMAL LOW (ref 39.0–52.0)
HEMOGLOBIN: 12.6 g/dL — AB (ref 13.0–17.0)
MCH: 31 pg (ref 26.0–34.0)
MCHC: 32.4 g/dL (ref 30.0–36.0)
MCV: 95.6 fL (ref 78.0–100.0)
Platelets: 107 10*3/uL — ABNORMAL LOW (ref 150–400)
RBC: 4.07 MIL/uL — ABNORMAL LOW (ref 4.22–5.81)
RDW: 17.3 % — AB (ref 11.5–15.5)
WBC: 6.3 10*3/uL (ref 4.0–10.5)

## 2014-08-12 LAB — BASIC METABOLIC PANEL
Anion gap: 10 (ref 5–15)
BUN: 18 mg/dL (ref 6–20)
CALCIUM: 8.3 mg/dL — AB (ref 8.9–10.3)
CHLORIDE: 95 mmol/L — AB (ref 101–111)
CO2: 33 mmol/L — AB (ref 22–32)
CREATININE: 0.87 mg/dL (ref 0.61–1.24)
GFR calc Af Amer: >60 mL/min (ref >60)
GFR calc non Af Amer: >60 mL/min (ref >60)
Glucose, Bld: 99 mg/dL (ref 65–99)
Potassium: 3.7 mmol/L (ref 3.5–5.1)
Sodium: 138 mmol/L (ref 135–145)

## 2014-08-12 LAB — PROTIME-INR
INR: 2.64 — AB (ref 0.00–1.49)
PROTHROMBIN TIME: 27.8 s — AB (ref 11.6–15.2)

## 2014-08-12 MED ORDER — CETYLPYRIDINIUM CHLORIDE 0.05 % MT LIQD
7.0000 mL | Freq: Two times a day (BID) | OROMUCOSAL | Status: DC
  Administered 2014-08-12 – 2014-08-21 (×16): 7 mL via OROMUCOSAL

## 2014-08-12 MED ORDER — CHLORHEXIDINE GLUCONATE 0.12% ORAL RINSE (MEDLINE KIT)
15.0000 mL | Freq: Two times a day (BID) | OROMUCOSAL | Status: DC
  Administered 2014-08-12 – 2014-08-20 (×12): 15 mL via OROMUCOSAL
  Filled 2014-08-12 (×2): qty 15

## 2014-08-12 NOTE — Progress Notes (Signed)
Additional 17 minute telephone conversation with patient's son, [name], concerning the same options that I reviewed with his mother. 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 He plans to contact me later this weekend after having a chance to talk things over with his mother.  Cleotis Nipper, M.D. Pager [phone] If no answer or after 5 PM call [phone]

## 2014-08-12 NOTE — Progress Notes (Signed)
[facility] PROGRESS NOTE  Jon Butler CWC:[id number] DOB: 04-17-1935 DOA: 08/08/2014 PCP: [name], MD  Assessment/Plan: 1. Acute hypoxic respiratory failure- multifactorial due to community acquired pneumonia as well as systolic CHF. Improving, continue Rocephin and Zithromax. Started on IV Lasix 40 mg twice a day.  2. 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 Persistent atrial fibrillation- status post DCCV, patient remained in A. Fib. Cardiology to attempt to DCCV next week. Amiodarone has been discontinued by cardiology 4. Dysphagia- patient underwent Esophogram, which showed distal esophageal obstruction versus spasm. GI has seen the patient and are discussing options with the patient's family. No final recommendations yet.  5. History of mechanical aortic valve- patient currently on warfarin, will start IV heparin when INR less than 2 6. History of alcohol abuse- continue thiamine 7. Acute on chronic thrombocytopenia- platelet count was 99 on 08/09/2014, this morning platelet count is 107 8. DVT prophylaxis- continue warfarin  Code Status: Full code Family Communication: *No family at bedside Disposition Plan: To be decided   Consultants:  Cardiology   Procedures:  Esophogram  Antibiotics:  None  HPI/Subjective: 79 y.o. male with Past medical history of left bundle-branch block, chronic systolic CHF, LVEF 76%, aortic replacement with mechanical valve, chronic anticoagulation, atrial fibrillation, alcohol use., presented with complaints of shortness of breath. Wife reported the patient has been becoming more tired fatigue and lethargic and also has developed cough with sputum production, also had some shaking chills.Admitted with CAP and CHF. Esophogram  Showed  possible distal esophageal mass versus spasm. Gastroenterology was consulted, and patient seen by Dr. Cristina Gong This morning patient denies any  complaints, no chest pain or shortness of breath.   Objective: Filed Vitals:   08/12/14 1300  BP: 130/79  Pulse: 65  Temp:   Resp:     Intake/Output Summary (Last 24 hours) at 08/12/14 1352 Last data filed at 08/12/14 1100  Gross per 24 hour  Intake 891.83 ml  Output    300 ml  Net 591.83 ml   Filed Weights   08/08/14 1843 08/09/14 0036  Weight: 89.721 kg (197 lb 12.8 oz) 89.7 kg (197 lb 12 oz)    Exam:   General:  Appears in no acute distress    Cardiovascular: S1-S2 regular  Respiratory: Clear to auscultation bilaterally  Abdomen: Soft, nontender, no organomegaly  Musculoskeletal: Bilateral 1+ edema of the lower extremities  Data Reviewed: Basic Metabolic Panel:  Recent Labs Lab 08/08/14 1853 08/09/14 0745 08/11/14 1439 08/12/14 0244  NA 136 139 139 138  K 3.9 3.6 4.3 3.7  CL 95* 100* 96* 95*  CO2 31 32 32 33*  GLUCOSE 147* 85 115* 99  BUN 21* 19 18 18   CREATININE 1.28* 0.85 0.99 0.87  CALCIUM 8.7* 8.1* 8.6* 8.3*   Liver Function Tests:  Recent Labs Lab 08/08/14 2328 08/09/14 0745  AST 36 28  ALT 30 28  ALKPHOS 96 82  BILITOT 1.4* 1.5*  PROT 5.6* 5.1*  ALBUMIN 3.1* 2.9*   No results for input(s): LIPASE, AMYLASE in the last 168 hours. No results for input(s): AMMONIA in the last 168 hours. CBC:  Recent Labs Lab 08/08/14 1853 08/08/14 2328 08/09/14 0745 08/12/14 0244  WBC 14.7*  --  8.7 6.3  NEUTROABS  --  11.7* 6.7  --   HGB 14.7  --  11.4* 12.6*  HCT 44.5  --  34.6* 38.9*  MCV 94.7  --  94.0 95.6  PLT 123*  --  99* 107*   Cardiac Enzymes: No results for input(s): CKTOTAL, CKMB, CKMBINDEX, TROPONINI in the last 168 hours. BNP (last 3 results)  Recent Labs  08/09/14 0745  BNP 2479.2*    ProBNP (last 3 results) No results for input(s): PROBNP in the last 8760 hours.  CBG:  Recent Labs Lab 08/09/14 1757  GLUCAP 104*    Recent Results (from the past 240 hour(s))  Culture, blood (routine x 2)     Status: None  (Preliminary result)   Collection Time: 08/08/14  9:10 PM  Result Value Ref Range Status   Specimen Description BLOOD LEFT ARM  Final   Special Requests BOTTLES DRAWN AEROBIC AND ANAEROBIC 5CC  Final   Culture NO GROWTH 4 DAYS  Final   Report Status PENDING  Incomplete  Culture, blood (routine x 2)     Status: None (Preliminary result)   Collection Time: 08/08/14  9:15 PM  Result Value Ref Range Status   Specimen Description BLOOD RIGHT WRIST  Final   Special Requests BOTTLES DRAWN AEROBIC AND ANAEROBIC 5CC  Final   Culture NO GROWTH 4 DAYS  Final   Report Status PENDING  Incomplete  MRSA PCR Screening     Status: None   Collection Time: 08/09/14  1:49 AM  Result Value Ref Range Status   MRSA by PCR NEGATIVE NEGATIVE Final    Comment:        The GeneXpert MRSA Assay (FDA approved for NASAL specimens only), is one component of a comprehensive MRSA colonization surveillance program. It is not intended to diagnose MRSA infection nor to guide or monitor treatment for MRSA infections.      Studies: Dg Esophagus  08/11/2014   CLINICAL DATA:  Esophageal dysmotility identified on modified barium swallow.  EXAM: ESOPHOGRAM/BARIUM SWALLOW  TECHNIQUE: Combined double contrast and single contrast examination performed using thin liquid barium.  FLUOROSCOPY TIME:  Radiation Exposure Index (as provided by the fluoroscopic device):  If the device does not provide the exposure index:  Fluoroscopy Time:  1 minutes 35 seconds  Number of Acquired Images:  91  COMPARISON:  CT 10/20/2012  FINDINGS: Exam is limited due to patient immobility. Oral contrast was administered by mouth to the patient in a supine in 30 degree position. Contrast flowed into the mildly distended esophagus. Contrast was restricted at the distal esophagus. Only a thin stream of contrast flowed through the distal esophagus. Majority of the oral contrast remains within the esophagus which is mildly tortuous. There is no retained food  stuff within the esophagus.  IMPRESSION: 1. No significant flow of contrast through the distal esophagus. Only a thin string of contrast flowed through to the stomach. There is abrupt narrowing of the distal esophagus. There is no food stuff within the distal esophagus. Findings suggest distal esophageal spasm. Cannot exclude esophageal mass however less likely with no retained food. Consider endoscopy.   Electronically Signed   By: Suzy Bouchard M.D.   On: 08/11/2014 10:43   Dg Chest Port 1v Same Day  08/11/2014   CLINICAL DATA:  Congestive heart failure/cardiomyopathy  EXAM: PORTABLE CHEST - 1 VIEW SAME DAY  COMPARISON:  August 10, 2014  FINDINGS: There is patchy bibasilar atelectatic type change. There is trace interstitial edema, marginally less than on the study from 1 day prior. No new opacity. Heart is slightly enlarged with pulmonary vascularity within normal limits. Pacemaker leads are attached to the right atrium, right ventricle, and left ventricle. There  is atherosclerotic change in aorta. No adenopathy. No pneumothorax.  IMPRESSION: Trace interstitial edema remains, slightly less than 1 day prior. There is patchy bibasilar atelectasis. No new opacity. Heart prominent but stable. Suspect mild residual congestive heart failure.   Electronically Signed   By: Lowella Grip III M.D.   On: 08/11/2014 07:44    Scheduled Meds: . allopurinol  300 mg Oral Daily  . antiseptic oral rinse  7 mL Mouth Rinse q12n4p  . aspirin EC  81 mg Oral Daily  . azithromycin  500 mg Intravenous Q24H  . cefTRIAXone (ROCEPHIN)  IV  1 g Intravenous Q24H  . chlorhexidine gluconate  15 mL Mouth Rinse BID  . donepezil  23 mg Oral QHS  . furosemide  40 mg Intravenous BID  . guaifenesin  600 mg Oral 4 times per day  . memantine  28 mg Oral Daily  . rosuvastatin  5 mg Oral Daily  . sodium chloride  3 mL Intravenous Q12H  . thiamine  100 mg Oral BID  . traZODone  400 mg Oral QHS   Continuous Infusions: . sodium  chloride    . amiodarone 30 mg/hr (08/12/14 1054)    Principal Problem:   Sepsis Active Problems:   Biventricular implantable cardioverter-defibrillator in situ   Atrial fibrillation   Acute on chronic systolic heart failure   Long term current use of anticoagulant therapy   Community acquired pneumonia   Alcohol use   Dysphagia    Time spent: 25 min    [facility]  [facility] Pager [phone]*. If 7PM-7AM, please contact night-coverage at [email or web address], password [facility] 08/12/2014, 1:52 PM  LOS: 4 days

## 2014-08-12 NOTE — Progress Notes (Signed)
ANTICOAGULATION CONSULT NOTE - Follow Up Consult  Pharmacy Consult  :  Heparin Indication  :  Bridging when INR < 2   Recent Labs  08/10/14 0234 08/11/14 0322 08/11/14 1439 08/12/14 0244  HGB  --   --   --  12.6*  HCT  --   --   --  38.9*  PLT  --   --   --  107*  LABPROT 35.6* 29.6*  --  27.8*  INR 3.67* 2.87*  --  2.64*  CREATININE  --   --  0.99 0.87    Medications: Scheduled:  . allopurinol  300 mg Oral Daily  . antiseptic oral rinse  7 mL Mouth Rinse q12n4p  . aspirin EC  81 mg Oral Daily  . azithromycin  500 mg Intravenous Q24H  . cefTRIAXone (ROCEPHIN)  IV  1 g Intravenous Q24H  . chlorhexidine gluconate  15 mL Mouth Rinse BID  . donepezil  23 mg Oral QHS  . furosemide  40 mg Intravenous BID  . guaifenesin  600 mg Oral 4 times per day  . memantine  28 mg Oral Daily  . rosuvastatin  5 mg Oral Daily  . sodium chloride  3 mL Intravenous Q12H  . thiamine  100 mg Oral BID  . traZODone  400 mg Oral QHS    Infusions:  . sodium chloride    . amiodarone 30 mg/hr (08/12/14 1054)    Assessment:  79 y/o male with chronic atrial fibrillation/flutter, AVR on chronic Coumadin.  Coumadin on hold for rescheduled DCCV.    INR remains > 2.6.  Heparin to be started when INR < 2.  Note, patient remains on amiodarone and likely reason for slow INR drip down.  Goal:  Target INR 2-3   Heparin level 0.3-0.7 units/ml   Plan: 1. Continue to hold Coumadin 2. Heparin will not be started until INR drops to < 2. 3. Daily INR's, Platelet counts,  CBC.    Violia Knopf, Craig Guess,  Pharm.D  08/12/2014, 12:47 PM

## 2014-08-12 NOTE — Progress Notes (Signed)
Since talking with the patient and his wife yesterday, I have had a chance to review his barium swallow films, and get a better picture of his overall medical status including reviewing Dr. Hillery Jacks very helpful note from yesterday. My opinions are summarized in my consultation note from yesterday, but are different from those expressed to the patient and his wife when I was talking with them yesterday.  I explained the various therapeutic options with the patient today. 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 The one question he asked me is, when can he eat.  I also called the patient's wife at home, and spoke to her for 22 minutes, discussing the options which are outlined in my consult note from yesterday. She seems to have a very clear understanding of what the options are, and would like for me to speak with her son, [name], who is in the medical field. The 2 of them could then arrive at a decision as to how to proceed. I will try to reach him later today.  Cleotis Nipper, M.D. Pager [phone] If no answer or after 5 PM call [phone]

## 2014-08-12 NOTE — Care Management Note (Signed)
Case Management Note  Patient Details  Name: Jon Butler MRN: [id number] Date of Birth: 1935/09/06  Subjective/Objective:            Pt from home with wife admitted with sepsis,  history of left bundle-branch block, chronic systolic CHF, LVEF 49%, aortic replacement with mechanical valve, chronic anticoagulation, atrial fibrillation, alcohol use. Wife states plan is for pt to be d/c to home, if home health services are needed [facility] would be their choice.   Action/Plan:  Return to home when medically stable. CM to f/u with disposition needs. Expected Discharge Date:                  Expected Discharge Plan:  [place]  In-House Referral:     Discharge planning Services  CM Consult  Post Acute Care Choice:    Choice offered to:     DME Arranged:    DME Agency:     HH Arranged:    [place] Agency:     Status of Service:  In process, will continue to follow  Medicare Important Message Given:  Yes-second notification given Date Medicare IM Given:    Medicare IM give by:    Date Additional Medicare IM Given:    Additional Medicare Important Message give by:     If discussed at [place] of Stay Meetings, dates discussed:    Additional Comments:  Sharin Mons, RN 08/12/2014, 7:48 PM

## 2014-08-12 NOTE — Progress Notes (Signed)
Subjective:  Patient is lying in bed and is comfortable with no complaints.  Evidently has dementia.  Dr. Cristina Gong and Dr. Tanna Furry notes from yesterday read.  No shortness of breath or chest pain.  Objective:  Vital Signs in the last 24 hours: BP 125/79 mmHg  Pulse 88  Temp(Src) 97.4 F (36.3 C) (Oral)  Resp 19  Ht 5\' 9"  (1.753 m)  Wt 89.7 kg (197 lb 12 oz)  BMI 29.19 kg/m2  SpO2 98%  Physical Exam: Elderly male in no acute distress lying in bed Lungs:  Clear  Cardiac:  Irregular rhythm, normal S1 and S2, no S3, 1 to 2/6 systolic murmur Abdomen:  Soft, nontender, no masses Extremities:  No edema present  Intake/Output from previous day: 08/05 0701 - 08/06 0700 In: 895 [I.V.:295; IV Piggyback:600] Out: -  Weight Filed Weights   08/08/14 1843 08/09/14 0036  Weight: 89.721 kg (197 lb 12.8 oz) 89.7 kg (197 lb 12 oz)    Lab Results: Basic Metabolic Panel:  Recent Labs  08/11/14 1439 08/12/14 0244  NA 139 138  K 4.3 3.7  CL 96* 95*  CO2 32 33*  GLUCOSE 115* 99  BUN 18 18  CREATININE 0.99 0.87    CBC:  Recent Labs  08/12/14 0244  WBC 6.3  HGB 12.6*  HCT 38.9*  MCV 95.6  PLT 107*    BNP    Component Value Date/Time   BNP 2479.2* 08/09/2014 0745    PROTIME: Lab Results  Component Value Date   INR 2.64* 08/12/2014   INR 2.87* 08/11/2014   INR 3.67* 08/10/2014    Telemetry: Paced rhythm, underlying rhythm is atrial fibrillation  Assessment/Plan:   1.  Chronic atrial fibrillation flutter unable to convert 2.  Nonischemic cardiomyopathy with acute on chronic systolic heart failure-volume status is improved 3.  Evidently recommendations made for palliative care discussion  Recommendations:  Discontinue IV amiodarone as it is still infusing.  Continue medical treatment for heart failure.  Evidently tachycardia therapies are going to be turned off for his defibrillator.  Volume status appears reasonable.   Kerry Hough  MD  [facility]  08/12/2014, 12:32 PM

## 2014-08-13 DIAGNOSIS — Z7901 Long term (current) use of anticoagulants: Secondary | ICD-10-CM

## 2014-08-13 DIAGNOSIS — Z515 Encounter for palliative care: Secondary | ICD-10-CM | POA: Diagnosis not present

## 2014-08-13 DIAGNOSIS — R4189 Other symptoms and signs involving cognitive functions and awareness: Secondary | ICD-10-CM

## 2014-08-13 LAB — CULTURE, BLOOD (ROUTINE X 2)
Culture: NO GROWTH
Culture: NO GROWTH

## 2014-08-13 LAB — BASIC METABOLIC PANEL
Anion gap: 9 (ref 5–15)
BUN: 16 mg/dL (ref 6–20)
CALCIUM: 8.4 mg/dL — AB (ref 8.9–10.3)
CO2: 34 mmol/L — ABNORMAL HIGH (ref 22–32)
Chloride: 93 mmol/L — ABNORMAL LOW (ref 101–111)
Creatinine, Ser: 0.89 mg/dL (ref 0.61–1.24)
GFR calc Af Amer: >60 mL/min (ref >60)
GFR calc non Af Amer: >60 mL/min (ref >60)
GLUCOSE: 117 mg/dL — AB (ref 65–99)
Potassium: 3.4 mmol/L — ABNORMAL LOW (ref 3.5–5.1)
Sodium: 136 mmol/L (ref 135–145)

## 2014-08-13 LAB — PROTIME-INR
INR: 2.35 — ABNORMAL HIGH (ref 0.00–1.49)
PROTHROMBIN TIME: 25.5 s — AB (ref 11.6–15.2)

## 2014-08-13 MED ORDER — POTASSIUM CHLORIDE CRYS ER 20 MEQ PO TBCR: 40.0000 meq | EXTENDED_RELEASE_TABLET | Freq: Once | ORAL | Status: DC

## 2014-08-13 NOTE — Progress Notes (Signed)
Order placed to give oral potassium replacement but pt is NPO, MD paged requesting change in route of medication. No new orders at this time. Will continue to monitor.

## 2014-08-13 NOTE — Consult Note (Signed)
Consultation Note Date: 08/13/2014   Patient Name: Jon Butler  DOB: 1935-08-01  MRN: [id number]  Age / Sex: 79 y.o., male   PCP: Jon Low, MD Referring Physician: Oswald Hillock, MD  Reason for Consultation: Establishing goals of care  Palliative Care Assessment and Plan Summary of Established Goals of Care and Medical Treatment Preferences   Clinical Assessment/Narrative: Pt is a 79 yo man adm with PNA, and h/o systolic heart failure with ef 10%, BBB, aortic valve replacement 26 years ago, chronic anticoagulation, a-fib, and dysphagia. Pt MBS reflects stricture. Dr. Cristina Gong with GI consulted and offered family 3 options: 1.) continue eating for comfort as he was. Pt has been experiencing trouble swallowing now for about 1 year. No weight loss  2.) Botox 3.) PEG. Son and wife share HCPOA. Met with wife and husband. Pt has profound short term memory deficits and cannot articulate why he is in the hospital, only that he wants something to eat and drink and to go home. Wife reports that she "knew this day was coming" and that "he wouldn't want to live this way". She describes Jon Butler as a self-made man, "brilliant, could figure out how to do anything". 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 He and his wife have 1 daughter and he has 2 from a previous marriage and they adopted 2 other children. Jon Butler states she has addressed specific issue with her son regarding ? Achalasia treatment options and wish to proceed with botox injection to stricture. It is their hope that this gives him some quality in terms of beng able to eat and drink more efficiently. She does seem aware of the risks. We did review disease progression secondary to dementia in the setting of end stage systolic heart failure.   Contacts/Participants in Discussion: Primary Decision Maker: Wife Jon Butler at [phone] and son Jon Butler at [phone] HCPOA: yes  Wife Jon Butler and son Jon Butler share [name].  Jon Butler and pt present for discussion  Code Status/Advance Care Planning:  DNR  AICD: Per spouse and RN this has already been deactivated which is their wish  No feeding tube  Want to go through with botox injection to esophageal stricture site. Aware of risks and benefits  Home with hospice home care  Family will need to have further discussion regarding risk benefit profile of coumadin in setting of pt falling multiple times at home  Symptom Management:   Pain: Pt denies pain Dr. Cristina Gong to manage after procedure if present  Dyspnea: Pt denies dyspnea  Additional Recommendations (Limitations, Scope, Preferences):  Continuation of coumadin Psycho-social/Spiritual:   Support System: yes  Desire for further Chaplaincy support:no  Prognosis: < 6 months  Discharge Planning:  Home with Hospice       Chief Complaint/History of Present Illness: Pt is a 79 yo man admitted with dyspnea, cough and found to have PNA. He also now in acute onchronic systolic heart failure with EF of 10%.  Primary Diagnoses  Present on Admission:  . Community acquired pneumonia . Sepsis . Atrial fibrillation . Biventricular implantable cardioverter-defibrillator in situ . Acute on chronic systolic heart failure . Alcohol use  Palliative Review of Systems: Pt very confused. Denies pain dyspnea, n/v. Asking for something to eat and drink I have reviewed the medical record, interviewed the patient and family, and examined the patient. The following aspects are pertinent.  Past Medical History  Diagnosis Date  . Left bundle-branch block   .  Cardiomyopathy   . Chronic systolic heart failure   . Atrial fibrillation    History   Social History  . Marital Status: Married    Spouse Name: Jon Butler  . Number of Children: 5  . Years of Education: 12   Occupational History  . retired    Social History Main Topics  . Smoking status: Never Smoker   . Smokeless tobacco: Never Used    . Alcohol Use: 0.0 oz/week    2-3 Glasses of wine per week     Comment: 2-3 glasses  . Drug Use: No  . Sexual Activity: No   Other Topics Concern  . None   Social History Narrative   Patient is married Jon Butler).   Patient is retired.   5 children    12th grade   2 cups caffeine daily   Family History  Problem Relation Age of Onset  . Aneurysm Mother     brain  . Heart attack Father 95   Scheduled Meds: . allopurinol  300 mg Oral Daily  . antiseptic oral rinse  7 mL Mouth Rinse q12n4p  . aspirin EC  81 mg Oral Daily  . azithromycin  500 mg Intravenous Q24H  . cefTRIAXone (ROCEPHIN)  IV  1 g Intravenous Q24H  . chlorhexidine gluconate  15 mL Mouth Rinse BID  . donepezil  23 mg Oral QHS  . furosemide  40 mg Intravenous BID  . guaifenesin  600 mg Oral 4 times per day  . memantine  28 mg Oral Daily  . potassium chloride  40 mEq Oral Once  . rosuvastatin  5 mg Oral Daily  . sodium chloride  3 mL Intravenous Q12H  . thiamine  100 mg Oral BID  . traZODone  400 mg Oral QHS   Continuous Infusions: . sodium chloride     PRN Meds:.fluticasone, LORazepam, sodium chloride Medications Prior to Admission:  Prior to Admission medications   Medication Sig Start Date End Date Taking? Authorizing Provider  allopurinol (ZYLOPRIM) 300 MG tablet Take 300 mg by mouth daily.    Yes Historical Provider, MD  amiodarone (PACERONE) 400 MG tablet Take 1 tablet (400 mg total) by mouth 2 (two) times daily. 08/02/14  Yes Jolaine Artist, MD  amoxicillin (AMOXIL) 500 MG capsule Take 500 mg by mouth every other day.    Yes Historical Provider, MD  aspirin EC 81 MG tablet Take 1 tablet (81 mg total) by mouth daily. 10/26/12  Yes Brooke O Edmisten, PA-C  carvedilol (COREG) 3.125 MG tablet Take 2 tablets (6.25 mg total) by mouth 2 (two) times daily with a meal. 09/08/13  Yes Evans Lance, MD  Coenzyme Q10 (CO Q-10 PO) Take 1 tablet by mouth daily.    Yes Historical Provider, MD  colchicine 0.6 MG  tablet Take 0.6 mg by mouth daily as needed (gout pain).    Yes Historical Provider, MD  donepezil (ARICEPT) 23 MG TABS tablet Take 1 tablet (23 mg total) by mouth at bedtime. 04/20/14  Yes Penni Bombard, MD  fluticasone (FLONASE) 50 MCG/ACT nasal spray Place 2 sprays into both nostrils daily as needed for allergies or rhinitis.  08/17/13  Yes Historical Provider, MD  furosemide (LASIX) 40 MG tablet Take 1 tablet (40 mg total) by mouth daily. 05/24/14  Yes Shaune Pascal Bensimhon, MD  lisinopril (PRINIVIL,ZESTRIL) 10 MG tablet Take 0.5 tablets (5 mg total) by mouth at bedtime. 07/07/14  Yes Evans Lance, MD  memantine (NAMENDA XR) 28  MG CP24 24 hr capsule Take 1 capsule (28 mg total) by mouth daily. 04/20/14  Yes Suanne Marker, MD  metolazone (ZAROXOLYN) 2.5 MG tablet Take 1 tablet (2.5 mg total) by mouth daily. 06/21/14  Yes Laurey Morale, MD  Multiple Vitamins-Minerals (MULTIVITAMIN WITH MINERALS) tablet Take 1 tablet by mouth daily.    Yes Historical Provider, MD  potassium chloride SA (K-DUR,KLOR-CON) 20 MEQ tablet Take 1 tablet (20 mEq total) by mouth daily. Take 1 tab when you take Metolazone 06/21/14  Yes Laurey Morale, MD  rosuvastatin (CRESTOR) 10 MG tablet Take 5 mg by mouth daily.    Yes Historical Provider, MD  spironolactone (ALDACTONE) 25 MG tablet Take 0.5 tablets (12.5 mg total) by mouth daily. 10/27/12  Yes Marinus Maw, MD  thiamine (VITAMIN B-1) 100 MG tablet Take 1 tablet (100 mg total) by mouth 2 (two) times daily. 12/16/12  Yes Amy D Clegg, NP  traZODone (DESYREL) 100 MG tablet Take 400 mg by mouth at bedtime. 06/29/14  Yes Historical Provider, MD  warfarin (COUMADIN) 2 MG tablet Take as directed by coumadin clinic Patient taking differently: Take 2-3 mg by mouth daily at 6 PM. Take 2 mg daily except on Tuesday and Saturday. Take 3 mg on Tuesday and Saturday 01/05/14  Yes Marinus Maw, MD   No Known Allergies CBC:    Component Value Date/Time   WBC 6.3 08/12/2014 0244     HGB 12.6* 08/12/2014 0244   HCT 38.9* 08/12/2014 0244   PLT 107* 08/12/2014 0244   MCV 95.6 08/12/2014 0244   NEUTROABS 6.7 08/09/2014 0745   LYMPHSABS 1.3 08/09/2014 0745   MONOABS 0.8 08/09/2014 0745   EOSABS 0.0 08/09/2014 0745   BASOSABS 0.0 08/09/2014 0745   Comprehensive Metabolic Panel:    Component Value Date/Time   NA 136 08/13/2014 0257   K 3.4* 08/13/2014 0257   CL 93* 08/13/2014 0257   CO2 34* 08/13/2014 0257   BUN 16 08/13/2014 0257   CREATININE 0.89 08/13/2014 0257   GLUCOSE 117* 08/13/2014 0257   CALCIUM 8.4* 08/13/2014 0257   AST 28 08/09/2014 0745   ALT 28 08/09/2014 0745   ALKPHOS 82 08/09/2014 0745   BILITOT 1.5* 08/09/2014 0745   PROT 5.1* 08/09/2014 0745   ALBUMIN 2.9* 08/09/2014 0745    Physical Exam: Vital Signs: BP 120/83 mmHg  Pulse 71  Temp(Src) 98 F (36.7 C) (Oral)  Resp 16  Ht 5\' 9"  (1.753 m)  Wt 89.7 kg (197 lb 12 oz)  BMI 29.19 kg/m2  SpO2 99% SpO2: SpO2: 99 % O2 Device: O2 Device: Nasal Cannula O2 Flow Rate: O2 Flow Rate (L/min): 2 L/min Intake/output summary:  Intake/Output Summary (Last 24 hours) at 08/13/14 1525 Last data filed at 08/13/14 1501  Gross per 24 hour  Intake    467 ml  Output    950 ml  Net   -483 ml   LBM: Last BM Date: 08/12/14 Baseline Weight: Weight: 89.721 kg (197 lb 12.8 oz) Most recent weight: Weight: 89.7 kg (197 lb 12 oz)  Exam Findings:  General: Well nourished older man lethargic but in no acute distress Resp: No work of breathing observed. No cough Cardiac: No edema Neuro: Oriented to self. Significant short term memory deficits         Palliative Performance Scale: 40%              Additional Data Reviewed: Recent Labs     08/12/14  0244  08/13/14  0257  WBC  6.3   --   HGB  12.6*   --   PLT  107*   --   NA  138  136  BUN  18  16  CREATININE  0.87  0.89     Time In: 1400 Time Out: 1515 Time Total: 75 min Greater than 50%  of this time was spent counseling and coordinating  care related to the above assessment and plan. Updated bedside RN  Signed by: Dory Horn, NP  Dory Horn, NP  08/13/2014, 3:25 PM  Please contact Palliative Medicine Team phone at [phone] for questions and concerns.

## 2014-08-13 NOTE — Progress Notes (Addendum)
SUBJECTIVE:  No complaints  OBJECTIVE:   Vitals:   Filed Vitals:   08/12/14 2012 08/13/14 0037 08/13/14 0428 08/13/14 0757  BP: 122/72 112/55 116/70 119/74  Pulse: 69 67 71 61  Temp: 98 F (36.7 C) 97.8 F (36.6 C) 98 F (36.7 C) 98.7 F (37.1 C)  TempSrc: Oral Oral Oral Oral  Resp: 16 16 16 16   Height:      Weight:      SpO2: 100% 96% 98% 97%   I&O's:   Intake/Output Summary (Last 24 hours) at 08/13/14 1129 Last data filed at 08/13/14 0800  Gross per 24 hour  Intake  533.8 ml  Output    650 ml  Net -116.2 ml   TELEMETRY: Reviewed telemetry pt in atrial fibrillation with V pacing and HR in 70's     PHYSICAL EXAM General: Well developed, well nourished, in no acute distress Head: Eyes PERRLA, No xanthomas.   Normal cephalic and atramatic  Lungs:   Clear bilaterally to auscultation and percussion. Heart:   Irregularly irregular S1 S2 Pulses are 2+ & equal. Abdomen: Bowel sounds are positive, abdomen soft and non-tender without masses Extremities:   No clubbing, cyanosis or edema.  DP +1 Neuro: Alert and oriented X 3. Psych:  Good affect, responds appropriately   LABS: Basic Metabolic Panel:  Recent Labs  08/12/14 0244 08/13/14 0257  NA 138 136  K 3.7 3.4*  CL 95* 93*  CO2 33* 34*  GLUCOSE 99 117*  BUN 18 16  CREATININE 0.87 0.89  CALCIUM 8.3* 8.4*   Liver Function Tests: No results for input(s): AST, ALT, ALKPHOS, BILITOT, PROT, ALBUMIN in the last 72 hours. No results for input(s): LIPASE, AMYLASE in the last 72 hours. CBC:  Recent Labs  08/12/14 0244  WBC 6.3  HGB 12.6*  HCT 38.9*  MCV 95.6  PLT 107*   Cardiac Enzymes: No results for input(s): CKTOTAL, CKMB, CKMBINDEX, TROPONINI in the last 72 hours. BNP: Invalid input(s): POCBNP D-Dimer: No results for input(s): DDIMER in the last 72 hours. Hemoglobin A1C: No results for input(s): HGBA1C in the last 72 hours. Fasting Lipid Panel: No results for input(s): CHOL, HDL, LDLCALC, TRIG,  CHOLHDL, LDLDIRECT in the last 72 hours. Thyroid Function Tests: No results for input(s): TSH, T4TOTAL, T3FREE, THYROIDAB in the last 72 hours.  Invalid input(s): FREET3 Anemia Panel: No results for input(s): VITAMINB12, FOLATE, FERRITIN, TIBC, IRON, RETICCTPCT in the last 72 hours. Coag Panel:   Lab Results  Component Value Date   INR 2.35* 08/13/2014   INR 2.64* 08/12/2014   INR 2.87* 08/11/2014    RADIOLOGY: Dg Chest 2 View  08/08/2014   CLINICAL DATA:  Acute onset of shortness of breath and decreased O2 saturation. Initial encounter.  EXAM: CHEST  2 VIEW  COMPARISON:  Chest radiograph performed 03/11/2013  FINDINGS: Diffuse bibasilar airspace opacification, right greater than left, raises concern for multifocal pneumonia. Small bilateral pleural effusions are suspected. No pneumothorax is seen.  The heart is borderline enlarged. The patient is status post median sternotomy. A pacemaker/AICD is noted at the left chest wall, with leads ending at the right atrium, right ventricle and coronary sinus. No acute osseous abnormalities are seen.  IMPRESSION: Diffuse bibasilar airspace opacification, right greater than left, raises concern for multifocal pneumonia. Small bilateral pleural effusions suspected. Borderline cardiomegaly.   Electronically Signed   By: Garald Balding M.D.   On: 08/08/2014 19:19   Dg Esophagus  08/11/2014   CLINICAL DATA:  Esophageal dysmotility identified on modified barium swallow.  EXAM: ESOPHOGRAM/BARIUM SWALLOW  TECHNIQUE: Combined double contrast and single contrast examination performed using thin liquid barium.  FLUOROSCOPY TIME:  Radiation Exposure Index (as provided by the fluoroscopic device):  If the device does not provide the exposure index:  Fluoroscopy Time:  1 minutes 35 seconds  Number of Acquired Images:  91  COMPARISON:  CT 10/20/2012  FINDINGS: Exam is limited due to patient immobility. Oral contrast was administered by mouth to the patient in a supine in 30  degree position. Contrast flowed into the mildly distended esophagus. Contrast was restricted at the distal esophagus. Only a thin stream of contrast flowed through the distal esophagus. Majority of the oral contrast remains within the esophagus which is mildly tortuous. There is no retained food stuff within the esophagus.  IMPRESSION: 1. No significant flow of contrast through the distal esophagus. Only a thin string of contrast flowed through to the stomach. There is abrupt narrowing of the distal esophagus. There is no food stuff within the distal esophagus. Findings suggest distal esophageal spasm. Cannot exclude esophageal mass however less likely with no retained food. Consider endoscopy.   Electronically Signed   By: Suzy Bouchard M.D.   On: 08/11/2014 10:43   Dg Chest Port 1 View  08/10/2014   CLINICAL DATA:  Pleural effusion.  EXAM: PORTABLE CHEST - 1 VIEW  COMPARISON:  08/08/2014  FINDINGS: Sequelae of prior median sternotomy are again identified. Pacemaker/ICD remains in place. Cardiac silhouette is mildly enlarged. Thoracic aortic calcification is noted. Bibasilar airspace opacities remain, slightly improved on the right and unchanged on the left compared to the prior study. No new airspace consolidation, sizeable pleural effusion, or pneumothorax is identified.  IMPRESSION: Persistent bibasilar infiltrates, slightly improved on the right in the interim.   Electronically Signed   By: Logan Bores   On: 08/10/2014 07:57   Dg Chest Port 1v Same Day  08/11/2014   CLINICAL DATA:  Congestive heart failure/cardiomyopathy  EXAM: PORTABLE CHEST - 1 VIEW SAME DAY  COMPARISON:  August 10, 2014  FINDINGS: There is patchy bibasilar atelectatic type change. There is trace interstitial edema, marginally less than on the study from 1 day prior. No new opacity. Heart is slightly enlarged with pulmonary vascularity within normal limits. Pacemaker leads are attached to the right atrium, right ventricle, and left  ventricle. There is atherosclerotic change in aorta. No adenopathy. No pneumothorax.  IMPRESSION: Trace interstitial edema remains, slightly less than 1 day prior. There is patchy bibasilar atelectasis. No new opacity. Heart prominent but stable. Suspect mild residual congestive heart failure.   Electronically Signed   By: Lowella Grip III M.D.   On: 08/11/2014 07:44   Dg Swallowing Func-speech Pathology  08/09/2014    Objective Swallowing Evaluation:    Patient Details  Name: Jon Butler MRN: [id number] Date of Birth: 1935-06-27  Today's Date: 08/09/2014 Time: SLP Start Time (ACUTE ONLY): 1310-SLP Stop Time (ACUTE ONLY): 1337 SLP Time Calculation (min) (ACUTE ONLY): 27 min  Past Medical History:  Past Medical History  Diagnosis Date  . Left bundle-branch block   . Cardiomyopathy   . Chronic systolic heart failure   . Atrial fibrillation    Past Surgical History:  Past Surgical History  Procedure Laterality Date  . Doppler echocardiography  2008, 2009  . Aortic valve replacement    . Tee without cardioversion N/A 10/15/2012    Procedure: TRANSESOPHAGEAL ECHOCARDIOGRAM (TEE);  Surgeon: Fay Records,  MD;  Location: [facility];  Service: Cardiovascular;  Laterality: N/A;  . Cardioversion N/A 10/25/2012    Procedure: CARDIOVERSION;  Surgeon: Evans Lance, MD;  Location: [facility];  Service: Cardiovascular;  Laterality: N/A;  . Cardioversion N/A 12/20/2012    Procedure: CARDIOVERSION;  Surgeon: Larey Dresser, MD;  Location: [facility];  Service: Cardiovascular;  Laterality: N/A;  . Implantable cardioverter defibrillator generator change N/A 09/20/2012    Procedure: IMPLANTABLE CARDIOVERTER DEFIBRILLATOR GENERATOR CHANGE;   Surgeon: Evans Lance, MD;  Location: [facility] CATH LAB;  Service:  Cardiovascular;  Laterality: N/A;   HPI:  Other Pertinent Information: Jon Butler is a 79 y.o. male with Past  medical history of left bundle-branch block, chronic systolic CHF, LVEF  77%, aortic replacement with  mechanical valve, chronic anticoagulation,  atrial fibrillation, alcohol use.The patient is presenting with complaints  of shortness of breath cough as well as chills. Chest x-ray shows  multifocal pneumonia. He has leukocytosis as well as hypoxia and low-grade  temperature.  Pt with a vague history of esophageal dysphagia.   No Data Recorded  Assessment / Plan / Recommendation CHL IP CLINICAL IMPRESSIONS 08/09/2014  Therapy Diagnosis Suspected primary esophageal dysphagia  Clinical Impression Pt presents with a likely primary esophageal  dysphagia.  Oropharyngeal swallow was functional with mild hypopharyngeal  residue noted with all consistencies; trace penetration thin liquids noted  on one occasion.  Esophageal screen revealed presence of barium  accumulating in esophagus - regardless of consistency -and eventually  filling entire column; back flow noted.  Stasis remained through course of  study.  Recommend  esophageal w/u to elucidate source of deficit  For now,  continue POs with esophageal strategies. D/W Dr. Broadus John, pt/wife.         CHL IP TREATMENT RECOMMENDATION 08/09/2014  Treatment Recommendations Therapy as outlined in treatment plan below     CHL IP DIET RECOMMENDATION 08/09/2014  SLP Diet Recommendations Age appropriate regular solids;Thin  Liquid Administration via (None)  Medication Administration Crushed with puree  Compensations Slow rate;Small sips/bites  Postural Changes and/or Swallow Maneuvers (None)     CHL IP OTHER RECOMMENDATIONS 08/09/2014  Recommended Consults Consider GI evaluation;Consider esophageal assessment   Oral Care Recommendations Oral care BID  Other Recommendations (None)     No flowsheet data found.          SLP Swallow Goals No flowsheet data found.  No flowsheet data found.    CHL IP REASON FOR REFERRAL 08/09/2014  Reason for Referral Objectively evaluate swallowing function     CHL IP ORAL PHASE 08/09/2014  Lips (None)  Tongue (None)  Mucous membranes (None)  Nutritional status  (None)  Other (None)  Oxygen therapy (None)  Oral Phase WFL  Oral - Pudding Teaspoon (None)  Oral - Pudding Cup (None)  Oral - Honey Teaspoon (None)  Oral - Honey Cup (None)  Oral - Honey Syringe (None)  Oral - Nectar Teaspoon (None)  Oral - Nectar Cup (None)  Oral - Nectar Straw (None)  Oral - Nectar Syringe (None)  Oral - Ice Chips (None)  Oral - Thin Teaspoon (None)  Oral - Thin Cup (None)  Oral - Thin Straw (None)  Oral - Thin Syringe (None)  Oral - Puree (None)  Oral - Mechanical Soft (None)  Oral - Regular (None)  Oral - Multi-consistency (None)  Oral - Pill (None)  Oral Phase - Comment (None)      CHL IP PHARYNGEAL PHASE 08/09/2014  Pharyngeal Phase Impaired  Pharyngeal - Pudding Teaspoon (None)  Penetration/Aspiration details (pudding teaspoon) (None)  Pharyngeal - Pudding Cup (None)  Penetration/Aspiration details (pudding cup) (None)  Pharyngeal - Honey Teaspoon (None)  Penetration/Aspiration details (honey teaspoon) (None)  Pharyngeal - Honey Cup (None)  Penetration/Aspiration details (honey cup) (None)  Pharyngeal - Honey Syringe (None)  Penetration/Aspiration details (honey syringe) (None)  Pharyngeal - Nectar Teaspoon (None)  Penetration/Aspiration details (nectar teaspoon) (None)  Pharyngeal - Nectar Cup (None)  Penetration/Aspiration details (nectar cup) (None)  Pharyngeal - Nectar Straw (None)  Penetration/Aspiration details (nectar straw) (None)  Pharyngeal - Nectar Syringe (None)  Penetration/Aspiration details (nectar syringe) (None)  Pharyngeal - Ice Chips (None)  Penetration/Aspiration details (ice chips) (None)  Pharyngeal - Thin Teaspoon (None)  Penetration/Aspiration details (thin teaspoon) (None)  Pharyngeal - Thin Cup (None)  Penetration/Aspiration details (thin cup) (None)  Pharyngeal - Thin Straw (None)  Penetration/Aspiration details (thin straw) (None)  Pharyngeal - Thin Syringe (None)  Penetration/Aspiration details (thin syringe') (None)  Pharyngeal - Puree (None)   Penetration/Aspiration details (puree) (None)  Pharyngeal - Mechanical Soft (None)  Penetration/Aspiration details (mechanical soft) (None)  Pharyngeal - Regular (None)  Penetration/Aspiration details (regular) (None)  Pharyngeal - Multi-consistency (None)  Penetration/Aspiration details (multi-consistency) (None)  Pharyngeal - Pill (None)  Penetration/Aspiration details (pill) (None)  Pharyngeal Comment (None)      CHL IP CERVICAL ESOPHAGEAL PHASE 08/09/2014  Cervical Esophageal Phase Impaired  Pudding Teaspoon (None)  Pudding Cup (None)  Honey Teaspoon (None)  Honey Cup (None)  Honey Straw (None)  Nectar Teaspoon (None)  Nectar Cup (None)  Nectar Straw (None)  Nectar Sippy Cup (None)  Thin Teaspoon (None)  Thin Cup (None)  Thin Straw (None)  Thin Sippy Cup (None)  Cervical Esophageal Comment see clinical impressions    No flowsheet data found.         Juan Quam Laurice 08/09/2014, 2:00 PM    ASSESSMENT AND PLAN:  Principal Problem:  Sepsis Active Problems:  Biventricular implantable cardioverter-defibrillator in situ  Atrial fibrillation  Acute on chronic systolic heart failure  Long term current use of anticoagulant therapy  Community acquired pneumonia  Alcohol use  Dysphagia  1. Atrial flutter Failed DCCV despite amiodarone.  Currently HR in 70's with V pacing. Appreciate EP input - Dr. Lovena Le feels likelihood of maintaining SR at this point is low.  Dr Lovena Le felt he is now near the end of his illness and has recommended that  tachy therapies be turned off and continue to treat the patient medically and get him discharged. He recommended discontinuing amiodarone and not pursue further rhythm control for now  2. NICM/acute on chronic systolic heart failure Volume status improving on IV Lasix Dr Lovena Le discussed tachy therapies with patient and his son. Tachy therapies were disabled. He recommended palliative care insult for goals of care discussion.    3. Aspiration GI  work up pending NPO for now Family to discuss goals of care  Sueanne Margarita, MD  08/13/2014  11:29 AM

## 2014-08-13 NOTE — Progress Notes (Signed)
[facility] PROGRESS NOTE  Whit Bruni Nicholes EGB:[id number] DOB: 1935/03/20 DOA: 08/08/2014 PCP: [name], MD  Assessment/Plan: 1. Acute hypoxic respiratory failure- multifactorial due to community acquired pneumonia as well as systolic CHF. Improving, continue Rocephin and Zithromax. Started on IV Lasix 40 mg twice a day.  2. 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 3. Persistent atrial fibrillation- status post DCCV, patient remained in A. Fib. Cardiology to attempt to DCCV next week. Amiodarone has been discontinued by cardiology 4. Dysphagia- patient underwent Esophogram, which showed distal esophageal obstruction versus spasm. GI has seen the patient and are discussing options with the patient's family. No final recommendations yet.  5. History of mechanical aortic valve- patient currently on warfarin, will start IV heparin when INR less than 2 6. History of alcohol abuse- continue thiamine 7. Hypokalemia- replace the potassium and check bmp in am. 8. Acute on chronic thrombocytopenia- check cbc in am. 9. DVT prophylaxis- heparin  Code Status: Full code Family Communication: *discussed with wife and son, they are agreeable for goals of care discussion, son is the [place]. Disposition Plan: To be decided   Consultants:  Cardiology   Procedures:  Esophogram  Antibiotics:  None  HPI/Subjective: 79 y.o. male with Past medical history of left bundle-branch block, chronic systolic CHF, LVEF 37%, aortic replacement with mechanical valve, chronic anticoagulation, atrial fibrillation, alcohol use., presented with complaints of shortness of breath. Wife reported the patient has been becoming more tired fatigue and lethargic and also has developed cough with sputum production, also had some shaking chills.Admitted with CAP and CHF. Esophogram  Showed  possible  distal esophageal mass versus spasm. Gastroenterology was consulted, and patient seen by Dr. Cristina Gong  This morning he denies pain , no shortness of breath.   Objective: Filed Vitals:   08/13/14 1200  BP:   Pulse:   Temp: 98.4 F (36.9 C)  Resp:     Intake/Output Summary (Last 24 hours) at 08/13/14 1305 Last data filed at 08/13/14 0800  Gross per 24 hour  Intake  533.8 ml  Output    650 ml  Net -116.2 ml   Filed Weights   08/08/14 1843 08/09/14 0036  Weight: 89.721 kg (197 lb 12.8 oz) 89.7 kg (197 lb 12 oz)    Exam:   General:  Appears in no acute distress    Cardiovascular: S1-S2 regular  Respiratory: Clear to auscultation bilaterally  Abdomen: Soft, nontender, no organomegaly  Musculoskeletal: Bilateral 1+ edema of the lower extremities  Data Reviewed: Basic Metabolic Panel:  Recent Labs Lab 08/08/14 1853 08/09/14 0745 08/11/14 1439 08/12/14 0244 08/13/14 0257  NA 136 139 139 138 136  K 3.9 3.6 4.3 3.7 3.4*  CL 95* 100* 96* 95* 93*  CO2 31 32 32 33* 34*  GLUCOSE 147* 85 115* 99 117*  BUN 21* 19 18 18 16   CREATININE 1.28* 0.85 0.99 0.87 0.89  CALCIUM 8.7* 8.1* 8.6* 8.3* 8.4*   Liver Function Tests:  Recent Labs Lab 08/08/14 2328 08/09/14 0745  AST 36 28  ALT 30 28  ALKPHOS 96 82  BILITOT 1.4* 1.5*  PROT 5.6* 5.1*  ALBUMIN 3.1* 2.9*   No results for input(s): LIPASE, AMYLASE in the last 168 hours. No results for input(s): AMMONIA in the last 168 hours. CBC:  Recent Labs Lab 08/08/14 1853 08/08/14 2328 08/09/14 0745 08/12/14 0244  WBC 14.7*  --  8.7  6.3  NEUTROABS  --  11.7* 6.7  --   HGB 14.7  --  11.4* 12.6*  HCT 44.5  --  34.6* 38.9*  MCV 94.7  --  94.0 95.6  PLT 123*  --  99* 107*   Cardiac Enzymes: No results for input(s): CKTOTAL, CKMB, CKMBINDEX, TROPONINI in the last 168 hours. BNP (last 3 results)  Recent Labs  08/09/14 0745  BNP 2479.2*    ProBNP (last 3 results) No results for input(s): PROBNP in the last  8760 hours.  CBG:  Recent Labs Lab 08/09/14 1757  GLUCAP 104*    Recent Results (from the past 240 hour(s))  Culture, blood (routine x 2)     Status: None   Collection Time: 08/08/14  9:10 PM  Result Value Ref Range Status   Specimen Description BLOOD LEFT ARM  Final   Special Requests BOTTLES DRAWN AEROBIC AND ANAEROBIC 5CC  Final   Culture NO GROWTH 5 DAYS  Final   Report Status 08/13/2014 FINAL  Final  Culture, blood (routine x 2)     Status: None   Collection Time: 08/08/14  9:15 PM  Result Value Ref Range Status   Specimen Description BLOOD RIGHT WRIST  Final   Special Requests BOTTLES DRAWN AEROBIC AND ANAEROBIC 5CC  Final   Culture NO GROWTH 5 DAYS  Final   Report Status 08/13/2014 FINAL  Final  MRSA PCR Screening     Status: None   Collection Time: 08/09/14  1:49 AM  Result Value Ref Range Status   MRSA by PCR NEGATIVE NEGATIVE Final    Comment:        The GeneXpert MRSA Assay (FDA approved for NASAL specimens only), is one component of a comprehensive MRSA colonization surveillance program. It is not intended to diagnose MRSA infection nor to guide or monitor treatment for MRSA infections.      Studies: No results found.  Scheduled Meds: . allopurinol  300 mg Oral Daily  . antiseptic oral rinse  7 mL Mouth Rinse q12n4p  . aspirin EC  81 mg Oral Daily  . azithromycin  500 mg Intravenous Q24H  . cefTRIAXone (ROCEPHIN)  IV  1 g Intravenous Q24H  . chlorhexidine gluconate  15 mL Mouth Rinse BID  . donepezil  23 mg Oral QHS  . furosemide  40 mg Intravenous BID  . guaifenesin  600 mg Oral 4 times per day  . memantine  28 mg Oral Daily  . potassium chloride  40 mEq Oral Once  . rosuvastatin  5 mg Oral Daily  . sodium chloride  3 mL Intravenous Q12H  . thiamine  100 mg Oral BID  . traZODone  400 mg Oral QHS   Continuous Infusions: . sodium chloride      Principal Problem:   Sepsis Active Problems:   Biventricular implantable  cardioverter-defibrillator in situ   Atrial fibrillation   Acute on chronic systolic heart failure   Long term current use of anticoagulant therapy   Community acquired pneumonia   Alcohol use   Dysphagia    Time spent: 25 min    [facility]  [facility] Pager [phone]*. If 7PM-7AM, please contact night-coverage at [email or web address], password [facility] 08/13/2014, 1:05 PM  LOS: 5 days

## 2014-08-13 NOTE — Anesthesia Postprocedure Evaluation (Signed)
  Anesthesia Post-op Note  Patient: Jon Butler Main  Procedure(s) Performed: Procedure(s): TRANSESOPHAGEAL ECHOCARDIOGRAM (TEE) (N/A) CARDIOVERSION (N/A)  Patient Location: PACU  Anesthesia Type:MAC  Level of Consciousness: awake and alert   Airway and Oxygen Therapy: Patient Spontanous Breathing  Post-op Pain: none  Post-op Assessment: Post-op Vital signs reviewed LLE Motor Response: Purposeful movement, Responds to commands   RLE Motor Response: Purposeful movement, Responds to commands        Post-op Vital Signs: Reviewed  Last Vitals:  Filed Vitals:   08/13/14 0757  BP:   Pulse:   Temp: 37.1 C  Resp:     Complications: No apparent anesthesia complications

## 2014-08-13 NOTE — Progress Notes (Signed)
Pt's wife and son, w/ whom I spoke this morning, have decided that they would like to proceed with endoscopic evaluation and, if appropriate, Botox injection of the LES. They are aware of the significantly increased risk of this procedure in this patient with a very low cardiac ejection fraction.  Plan: Endoscopy when the patient's clotting function has normalized and he has been transitioned to heparin.  At the moment, the patient's INR is still above 2. It is possible that he will have been transitioned to heparin by tomorrow morning when his INR is next checked, in which case it may be possible to stop the heparin and to an afternoon endoscopy. We will follow the patient with you.  I have reviewed the risks of the procedure with the patient and his wife, and also discussed it at considerable length with his son on the telephone.  Cleotis Nipper, M.D. Pager [phone] If no answer or after 5 PM call [phone]

## 2014-08-14 DIAGNOSIS — Z515 Encounter for palliative care: Secondary | ICD-10-CM

## 2014-08-14 DIAGNOSIS — R1310 Dysphagia, unspecified: Secondary | ICD-10-CM

## 2014-08-14 LAB — BASIC METABOLIC PANEL
Anion gap: 9 (ref 5–15)
BUN: 13 mg/dL (ref 6–20)
CHLORIDE: 94 mmol/L — AB (ref 101–111)
CO2: 36 mmol/L — AB (ref 22–32)
Calcium: 8.2 mg/dL — ABNORMAL LOW (ref 8.9–10.3)
Creatinine, Ser: 0.84 mg/dL (ref 0.61–1.24)
GFR calc Af Amer: >60 mL/min (ref >60)
GFR calc non Af Amer: >60 mL/min (ref >60)
Glucose, Bld: 87 mg/dL (ref 65–99)
Potassium: 3.2 mmol/L — ABNORMAL LOW (ref 3.5–5.1)
Sodium: 139 mmol/L (ref 135–145)

## 2014-08-14 LAB — CBC
HEMATOCRIT: 38.2 % — AB (ref 39.0–52.0)
HEMOGLOBIN: 12.4 g/dL — AB (ref 13.0–17.0)
MCH: 30.8 pg (ref 26.0–34.0)
MCHC: 32.5 g/dL (ref 30.0–36.0)
MCV: 95 fL (ref 78.0–100.0)
Platelets: 120 10*3/uL — ABNORMAL LOW (ref 150–400)
RBC: 4.02 MIL/uL — AB (ref 4.22–5.81)
RDW: 16.8 % — ABNORMAL HIGH (ref 11.5–15.5)
WBC: 6.2 10*3/uL (ref 4.0–10.5)

## 2014-08-14 LAB — PROTIME-INR
INR: 2.27 — ABNORMAL HIGH (ref 0.00–1.49)
Prothrombin Time: 24.8 seconds — ABNORMAL HIGH (ref 11.6–15.2)

## 2014-08-14 LAB — MAGNESIUM: Magnesium: 1.7 mg/dL (ref 1.7–2.4)

## 2014-08-14 MED ORDER — POTASSIUM CHLORIDE 10 MEQ/100ML IV SOLN
10.0000 meq | INTRAVENOUS | Status: DC
  Administered 2014-08-14: 10 meq via INTRAVENOUS
  Filled 2014-08-14: qty 100

## 2014-08-14 MED ORDER — SODIUM CHLORIDE 0.9 % IV SOLN
INTRAVENOUS | Status: DC
  Filled 2014-08-14 (×2): qty 1000

## 2014-08-14 MED ORDER — MAGNESIUM SULFATE 2 GM/50ML IV SOLN
2.0000 g | Freq: Once | INTRAVENOUS | Status: AC
  Administered 2014-08-14: 2 g via INTRAVENOUS
  Filled 2014-08-14: qty 50

## 2014-08-14 MED ORDER — POTASSIUM CHLORIDE CRYS ER 20 MEQ PO TBCR: 40.0000 meq | EXTENDED_RELEASE_TABLET | ORAL | Status: DC

## 2014-08-14 MED ORDER — POTASSIUM CHLORIDE CRYS ER 20 MEQ PO TBCR
40.0000 meq | EXTENDED_RELEASE_TABLET | ORAL | Status: AC
  Administered 2014-08-14 (×2): 40 meq via ORAL
  Filled 2014-08-14: qty 2

## 2014-08-14 MED ORDER — POTASSIUM CHLORIDE 20 MEQ PO PACK
40.0000 meq | PACK | ORAL | Status: DC
  Filled 2014-08-14 (×2): qty 2

## 2014-08-14 NOTE — Progress Notes (Signed)
Patient ID: Jon Butler, male   DOB: 1935-07-13, 79 y.o.   MRN: [id number] [facility] Progress Note  Jon Butler 79 y.o. 17-Jan-1935   Subjective: Son at bedside. Tolerating ice chips. No complaints. Nurse in room.  Objective: Vital signs in last 24 hours: Filed Vitals:   08/14/14 0748  BP: 105/69  Pulse: 67  Temp: 97.5 F (36.4 C)  Resp: 14    Physical Exam: Gen: elderly, alert, no acute distress CV: RRR Chest: CTA B Abd: soft, nontender, nondistended, +BS  Lab Results:  Recent Labs  08/13/14 0257 08/14/14 0224  NA 136 139  K 3.4* 3.2*  CL 93* 94*  CO2 34* 36*  GLUCOSE 117* 87  BUN 16 13  CREATININE 0.89 0.84  CALCIUM 8.4* 8.2*  MG  --  1.7   No results for input(s): AST, ALT, ALKPHOS, BILITOT, PROT, ALBUMIN in the last 72 hours.  Recent Labs  08/12/14 0244 08/14/14 0224  WBC 6.3 6.2  HGB 12.6* 12.4*  HCT 38.9* 38.2*  MCV 95.6 95.0  PLT 107* 120*    Recent Labs  08/13/14 0257 08/14/14 0224  LABPROT 25.5* 24.8*  INR 2.35* 2.27*      Assessment/Plan: Dysphagia - question achalasia vs. Esophageal spasm. Tolerating diet prior to admit but had intermittent trouble with swallowing pills at home. Will put on clear liquid diet with aspiration precautions. If tolerates then will slowly advance. Tentatively plan for EGD with Botox mid-week when INR is less than 2. Son and patient aware of plan/recs. Will follow.   [name] C. 08/14/2014, 1:01 PM  Pager [phone]  If no answer or after 5 PM call [phone]

## 2014-08-14 NOTE — Progress Notes (Signed)
[facility] PROGRESS NOTE  Jon Butler ZJI:[id number] DOB: 16-Dec-1935 DOA: 08/08/2014 PCP: [name], MD  Assessment/Plan: 1. Acute hypoxic respiratory failure- multifactorial due to community acquired pneumonia as well as systolic CHF. Improving clinically. Patient currently 96% on 2 L nasal cannula. Patient is -1.2 L over the past 24 hours. Continue IV Lasix. Continue empiric IV Rocephin and Zithromax. Follow. 2. Acute on chronic systolic CHF- patient has nonischemic cardiomyopathy with EF 20%, followed by cardiology. I/O = -1.2L/24hrs. patient currently nothing by mouth with high aspiration risk and assess still on IV diuretics. Patient has been seen in consultation by palliative care and patient currently a DO NOT RESUSCITATE. Patient wanted to go through GI procedure for esophageal stricture and possibly home with hospice. Per cardiology. 3. Persistent atrial fibrillation- status post failed DCCV despite amiodarone on 08/10/2014. Patient remained in A. fib with rate controlled. Amiodarone has been stopped per cardiology. Rate seems to be controlled. Per cardiology. 4. Dysphagia- patient underwent Esophogram, which showed distal esophageal obstruction versus spasm. GI has seen the patient and are discussing options with the patient's family. Patient was also seen by palliative care from their note seems as if patient and family want to go through procedure for esophageal stricture at this time and as such awaiting GI procedure once INR is subtherapeutic. Per GI.  5. History of mechanical aortic valve- patient currently on warfarin, will start IV heparin when INR less than 2 6. History of alcohol abuse- continue thiamine 7. Hypokalemia- will replete. Keep magnesium greater than 2. 8. Acute on chronic thrombocytopenia- stable. Follow.  9. DVT prophylaxis- INR therapeutic. Was on Coumadin. Will transition to heparin once INR less than 2.   Code Status: Full code Family Communication:  Updated patient. No family present. Disposition Plan: Home with hospice once medically stable, and when okay with cardiology and after GI procedure.   Consultants:  Cardiology: Dr Haroldine Laws 08/09/2014  Gastroenterology: Dr. Cristina Gong 08/11/2014   Palliative care: Imelda Pillow, NP 08/13/2014  Procedures:  Esophogram 08/11/2014  Chest x-ray 08/08/2014, 08/09/2014, 08/11/2014  TEE with cardioversion (unsuccessful) 08/10/2014 Dr Sallyanne Kuster  Antibiotics:  IV azithromycin 08/08/2014>>>>>> 08/15/2014  IV Rocephin 08/08/2014>>>>>>> 08/15/2014  HPI/Subjective: 79 y.o. male with Past medical history of left bundle-branch block, chronic systolic CHF, LVEF 17%, aortic replacement with mechanical valve, chronic anticoagulation, atrial fibrillation, alcohol use., presented with complaints of shortness of breath. Wife reported the patient has been becoming more tired fatigue and lethargic and also has developed cough with sputum production, also had some shaking chills.Admitted with CAP and CHF. Esophogram  Showed  possible distal esophageal mass versus spasm. Gastroenterology was consulted, and patient seen by Dr. Cristina Gong. Currently awaiting INR to be subtherapeutic prior to upper endoscopy with intervention per GI. Patient is currently nothing by mouth. Cardiology following.     Patient denies any chest pain. No shortness of breath. Patient asking when he can go home.   Objective: Filed Vitals:   08/14/14 0748  BP:   Pulse: 67  Temp: 97.5 F (36.4 C)  Resp: 14    Intake/Output Summary (Last 24 hours) at 08/14/14 1041 Last data filed at 08/14/14 0947  Gross per 24 hour  Intake    300 ml  Output   1450 ml  Net  -1150 ml   Filed Weights   08/08/14 1843 08/09/14 0036  Weight: 89.721 kg (197 lb 12.8 oz) 89.7 kg (197 lb 12 oz)    Exam:   General:  Appears in no acute distress  Cardiovascular: Irregularly irregular  Respiratory: Clear to auscultation  bilaterally  Abdomen: Soft, nontender, no organomegaly  Musculoskeletal: Trace to Bilateral 1+ edema of the lower extremities  Data Reviewed: Basic Metabolic Panel:  Recent Labs Lab 08/09/14 0745 08/11/14 1439 08/12/14 0244 08/13/14 0257 08/14/14 0224  NA 139 139 138 136 139  K 3.6 4.3 3.7 3.4* 3.2*  CL 100* 96* 95* 93* 94*  CO2 32 32 33* 34* 36*  GLUCOSE 85 115* 99 117* 87  BUN 19 18 18 16 13   CREATININE 0.85 0.99 0.87 0.89 0.84  CALCIUM 8.1* 8.6* 8.3* 8.4* 8.2*  MG  --   --   --   --  1.7   Liver Function Tests:  Recent Labs Lab 08/08/14 2328 08/09/14 0745  AST 36 28  ALT 30 28  ALKPHOS 96 82  BILITOT 1.4* 1.5*  PROT 5.6* 5.1*  ALBUMIN 3.1* 2.9*   No results for input(s): LIPASE, AMYLASE in the last 168 hours. No results for input(s): AMMONIA in the last 168 hours. CBC:  Recent Labs Lab 08/08/14 1853 08/08/14 2328 08/09/14 0745 08/12/14 0244 08/14/14 0224  WBC 14.7*  --  8.7 6.3 6.2  NEUTROABS  --  11.7* 6.7  --   --   HGB 14.7  --  11.4* 12.6* 12.4*  HCT 44.5  --  34.6* 38.9* 38.2*  MCV 94.7  --  94.0 95.6 95.0  PLT 123*  --  99* 107* 120*   Cardiac Enzymes: No results for input(s): CKTOTAL, CKMB, CKMBINDEX, TROPONINI in the last 168 hours. BNP (last 3 results)  Recent Labs  08/09/14 0745  BNP 2479.2*    ProBNP (last 3 results) No results for input(s): PROBNP in the last 8760 hours.  CBG:  Recent Labs Lab 08/09/14 1757  GLUCAP 104*    Recent Results (from the past 240 hour(s))  Culture, blood (routine x 2)     Status: None   Collection Time: 08/08/14  9:10 PM  Result Value Ref Range Status   Specimen Description BLOOD LEFT ARM  Final   Special Requests BOTTLES DRAWN AEROBIC AND ANAEROBIC 5CC  Final   Culture NO GROWTH 5 DAYS  Final   Report Status 08/13/2014 FINAL  Final  Culture, blood (routine x 2)     Status: None   Collection Time: 08/08/14  9:15 PM  Result Value Ref Range Status   Specimen Description BLOOD RIGHT WRIST   Final   Special Requests BOTTLES DRAWN AEROBIC AND ANAEROBIC 5CC  Final   Culture NO GROWTH 5 DAYS  Final   Report Status 08/13/2014 FINAL  Final  MRSA PCR Screening     Status: None   Collection Time: 08/09/14  1:49 AM  Result Value Ref Range Status   MRSA by PCR NEGATIVE NEGATIVE Final    Comment:        The GeneXpert MRSA Assay (FDA approved for NASAL specimens only), is one component of a comprehensive MRSA colonization surveillance program. It is not intended to diagnose MRSA infection nor to guide or monitor treatment for MRSA infections.      Studies: No results found.  Scheduled Meds: . allopurinol  300 mg Oral Daily  . antiseptic oral rinse  7 mL Mouth Rinse q12n4p  . aspirin EC  81 mg Oral Daily  . azithromycin  500 mg Intravenous Q24H  . cefTRIAXone (ROCEPHIN)  IV  1 g Intravenous Q24H  . chlorhexidine gluconate  15 mL Mouth Rinse BID  . donepezil  23 mg Oral QHS  . furosemide  40 mg Intravenous BID  . guaifenesin  600 mg Oral 4 times per day  . magnesium sulfate 1 - 4 g bolus IVPB  2 g Intravenous Once  . memantine  28 mg Oral Daily  . potassium chloride  10 mEq Intravenous Q1 Hr x 5  . rosuvastatin  5 mg Oral Daily  . sodium chloride  3 mL Intravenous Q12H  . thiamine  100 mg Oral BID  . traZODone  400 mg Oral QHS   Continuous Infusions: . sodium chloride      Principal Problem:   Acute respiratory failure with hypoxia Active Problems:   Atrial fibrillation   Acute on chronic systolic heart failure   Community acquired pneumonia   Biventricular implantable cardioverter-defibrillator in situ   Long term current use of anticoagulant therapy   Sepsis   Alcohol use   Dysphagia   Palliative care encounter   Cognitive impairment    Time spent: 102 min    [facility] MD [facility] Pager [phone]. If 7PM-7AM, please contact night-coverage at [email or web address], password [facility] 08/14/2014, 10:41 AM  LOS: 6 days

## 2014-08-14 NOTE — Progress Notes (Signed)
UR COMPLETED  

## 2014-08-14 NOTE — Progress Notes (Signed)
Speech Language Pathology Contact Note  ST f/u following results of Esophagram.  The esophagram noted a possible stricture and the patient is pending botox injections.  Given results of MBS and esophagram ST to sign off at this time.  If need please feel free to reconsult ST.  Thank you for the consult.  Shelly Flatten, [place], [place] SLP [phone]

## 2014-08-14 NOTE — Progress Notes (Signed)
ANTICOAGULATION CONSULT NOTE - Initial Consult  Pharmacy Consult for Heparin when INR <2 Indication: afib, mechanical aortic valve  No Known Allergies  Patient Measurements: Height: 5\' 9"  (175.3 cm) Weight: 197 lb 12 oz (89.7 kg) IBW/kg (Calculated) : 70.7 Heparin Dosing Weight: 88 kg  Vital Signs: Temp: 97.5 F (36.4 C) (08/08 0748) Temp Source: Oral (08/08 0748) BP: 105/69 mmHg (08/08 0509) Pulse Rate: 67 (08/08 0748)  Labs:  Recent Labs  08/12/14 0244 08/13/14 0257 08/14/14 0224  HGB 12.6*  --  12.4*  HCT 38.9*  --  38.2*  PLT 107*  --  120*  LABPROT 27.8* 25.5* 24.8*  INR 2.64* 2.35* 2.27*  CREATININE 0.87 0.89 0.84    Estimated Creatinine Clearance: 82.9 mL/min (by C-G formula based on Cr of 0.84).   Medical History: Past Medical History  Diagnosis Date  . Left bundle-branch block   . Cardiomyopathy   . Chronic systolic heart failure   . Atrial fibrillation     Medications:  Scheduled:  . allopurinol  300 mg Oral Daily  . antiseptic oral rinse  7 mL Mouth Rinse q12n4p  . aspirin EC  81 mg Oral Daily  . azithromycin  500 mg Intravenous Q24H  . cefTRIAXone (ROCEPHIN)  IV  1 g Intravenous Q24H  . chlorhexidine gluconate  15 mL Mouth Rinse BID  . donepezil  23 mg Oral QHS  . furosemide  40 mg Intravenous BID  . guaifenesin  600 mg Oral 4 times per day  . magnesium sulfate 1 - 4 g bolus IVPB  2 g Intravenous Once  . memantine  28 mg Oral Daily  . potassium chloride  40 mEq Oral Q4H  . rosuvastatin  5 mg Oral Daily  . sodium chloride  3 mL Intravenous Q12H  . thiamine  100 mg Oral BID  . traZODone  400 mg Oral QHS   Infusions:  . sodium chloride      Assessment: 79 yo M on Coumadin PTA for hx afib and mechanical aortic valve.  Coumadin on hold for EGD with plans to bridge with heparin.  Will start heparin when INR <2.  Goal of Therapy:  INR 2-3 Heparin level 0.3-0.7 units/ml Monitor platelets by anticoagulation protocol: Yes   Plan:  No  heparin at this time. INR in AM  [employer], Pharm.D., BCPS Clinical Pharmacist Pager [phone] 08/14/2014 1:35 PM

## 2014-08-14 NOTE — Care Management Important Message (Signed)
Important Message  Patient Details  Name: Jon Butler MRN: [id number] Date of Birth: [birth year]   Medicare Important Message Given:  Yes-third notification given    Delorse Lek 08/14/2014, 4:07 PM

## 2014-08-14 NOTE — Progress Notes (Signed)
Subjective: No SOB, orthopnea alert but not oriented, son at bedside  Objective: Vital signs in last 24 hours: Temp:  [97.4 F (36.3 C)-98.4 F (36.9 C)] 97.5 F (36.4 C) (08/08 0748) Pulse Rate:  [58-73] 64 (08/08 0509) Resp:  [16-21] 18 (08/08 0509) BP: (103-121)/(62-83) 105/69 mmHg (08/08 0509) SpO2:  [97 %-100 %] 97 % (08/08 0509) Last BM Date: 08/12/14  Intake/Output from previous day: 08/07 0701 - 08/08 0700 In: 300 [IV Piggyback:300] Out: 1500 [Urine:1500] Intake/Output this shift:    Medications Scheduled Meds: . allopurinol  300 mg Oral Daily  . antiseptic oral rinse  7 mL Mouth Rinse q12n4p  . aspirin EC  81 mg Oral Daily  . azithromycin  500 mg Intravenous Q24H  . cefTRIAXone (ROCEPHIN)  IV  1 g Intravenous Q24H  . chlorhexidine gluconate  15 mL Mouth Rinse BID  . donepezil  23 mg Oral QHS  . furosemide  40 mg Intravenous BID  . guaifenesin  600 mg Oral 4 times per day  . memantine  28 mg Oral Daily  . potassium chloride  10 mEq Intravenous Q1 Hr x 5  . rosuvastatin  5 mg Oral Daily  . sodium chloride  3 mL Intravenous Q12H  . thiamine  100 mg Oral BID  . traZODone  400 mg Oral QHS   Continuous Infusions: . sodium chloride    . sodium chloride 0.9 % 1,000 mL with potassium chloride 40 mEq infusion     PRN Meds:.fluticasone, LORazepam, sodium chloride  PE: General appearance: alert, cooperative and no distress Lungs: clear to auscultation bilaterally Heart: irregularly irregular rhythm and no mm. mech valve clicks Abdomen: +BS, soft, nontender Extremities: No LEE Pulses: 2+ and symmetric Skin: Warm and dry Neurologic: Grossly normal  Lab Results:   Recent Labs  08/12/14 0244 08/14/14 0224  WBC 6.3 6.2  HGB 12.6* 12.4*  HCT 38.9* 38.2*  PLT 107* 120*   BMET  Recent Labs  08/12/14 0244 08/13/14 0257 08/14/14 0224  NA 138 136 139  K 3.7 3.4* 3.2*  CL 95* 93* 94*  CO2 33* 34* 36*  GLUCOSE 99 117* 87  BUN 18 16 13   CREATININE  0.87 0.89 0.84  CALCIUM 8.3* 8.4* 8.2*   PT/INR  Recent Labs  08/12/14 0244 08/13/14 0257 08/14/14 0224  LABPROT 27.8* 25.5* 24.8*  INR 2.64* 2.35* 2.27*     Assessment/Plan 79 year old male with PMH of ETOH abuse, h/o aortic root aneurysm 1989, s/p [employer] Mechanical AVR, LBBB, PAF s/p DCCV in 12/2012, and h/o NICM with EF 20% s/p MDT CRT-D (device changed out 09/20/2012) presented with low grade fever, chill, found to have likely multifocal PNA, family also noticed recent weight gain.  Principal Problem:   Sepsis Active Problems:   Biventricular implantable cardioverter-defibrillator in situ   Atrial fibrillation   Acute on chronic systolic heart failure   Long term current use of anticoagulant therapy   Community acquired pneumonia   Alcohol use   Dysphagia   Palliative care encounter   Cognitive impairment  1. Atrial flutter Failed DCCV despite amiodarone.  Mostly V-Pacing.  A couple short V-tach runs. Likelihood of maintaining SR at this point is low  Rate control primary goal and it is controlled -  Amiodarone stopped.    2. NICM/acute on chronic systolic heart failure, SF68-12% Dr Lovena Le discussed tachy therapies with patient and his son. They were disabled.  Palliative med following.  Net fluids: -1.2L/+0.9L. Plus several unmeasured occurrences.  He appears euvolemic or very close to it.  No orthopnea, LEE, lungs clear.  Consider switching to PO lasix.   3. Aspiration GI work up pending-EGD when INR normalized.  NPO for now  4.  Hypokalemia  Being replaced.  No extra fluids with the IV K+.     LOS: 6 days    HAGER, BRYAN PA-C 08/14/2014 8:28 AM  Agree we shoould change lasix to po but right now is NPO so lets just hold steady for now

## 2014-08-15 LAB — CBC
HCT: 38.1 % — ABNORMAL LOW (ref 39.0–52.0)
Hemoglobin: 12.4 g/dL — ABNORMAL LOW (ref 13.0–17.0)
MCH: 30.9 pg (ref 26.0–34.0)
MCHC: 32.5 g/dL (ref 30.0–36.0)
MCV: 95 fL (ref 78.0–100.0)
PLATELETS: 125 10*3/uL — AB (ref 150–400)
RBC: 4.01 MIL/uL — AB (ref 4.22–5.81)
RDW: 16.7 % — ABNORMAL HIGH (ref 11.5–15.5)
WBC: 6.5 10*3/uL (ref 4.0–10.5)

## 2014-08-15 LAB — BASIC METABOLIC PANEL
Anion gap: 9 (ref 5–15)
BUN: 13 mg/dL (ref 6–20)
CHLORIDE: 94 mmol/L — AB (ref 101–111)
CO2: 33 mmol/L — AB (ref 22–32)
Calcium: 8.2 mg/dL — ABNORMAL LOW (ref 8.9–10.3)
Creatinine, Ser: 0.87 mg/dL (ref 0.61–1.24)
GFR calc non Af Amer: >60 mL/min (ref >60)
Glucose, Bld: 98 mg/dL (ref 65–99)
Potassium: 4 mmol/L (ref 3.5–5.1)
SODIUM: 136 mmol/L (ref 135–145)

## 2014-08-15 LAB — MAGNESIUM: Magnesium: 2 mg/dL (ref 1.7–2.4)

## 2014-08-15 LAB — PROTIME-INR
INR: 2.42 — ABNORMAL HIGH (ref 0.00–1.49)
Prothrombin Time: 26 seconds — ABNORMAL HIGH (ref 11.6–15.2)

## 2014-08-15 MED ORDER — FUROSEMIDE 10 MG/ML IJ SOLN
20.0000 mg | Freq: Every day | INTRAMUSCULAR | Status: DC
  Administered 2014-08-16 – 2014-08-18 (×3): 20 mg via INTRAVENOUS
  Filled 2014-08-15 (×2): qty 2

## 2014-08-15 MED ORDER — FUROSEMIDE 40 MG PO TABS
40.0000 mg | ORAL_TABLET | Freq: Every day | ORAL | Status: DC
  Administered 2014-08-15: 40 mg via ORAL
  Filled 2014-08-15: qty 1

## 2014-08-15 NOTE — Progress Notes (Addendum)
Pt dicussed in LOS meeting.

## 2014-08-15 NOTE — Progress Notes (Signed)
Report given to RN, 6E, all questions answered, wife at bedside. No complaints, transferred.

## 2014-08-15 NOTE — Progress Notes (Signed)
[facility] PROGRESS NOTE  Jon Butler RSW:[id number] DOB: [birth year] DOA: 08/08/2014 PCP: [name], MD  Assessment/Plan: 1. Acute hypoxic respiratory failure- multifactorial due to community acquired pneumonia as well as systolic CHF. Improving clinically. Patient currently 98% on 2 L nasal cannula. Will change IV Lasix to oral Lasix. Patient is status post 7 days of IV antibiotics and has appropriately been treated. Follow. 2. Acute on chronic systolic CHF- patient has nonischemic cardiomyopathy with EF 20%, followed by cardiology. I/O = 780cc/24hrs. patient currently on full liquids with aspiration precautions, with high aspiration risk until INR is subtherapeutic for GI procedure. Change IV Lasix to oral home dose.Patient has been seen in consultation by palliative care and patient currently a DO NOT RESUSCITATE. Patient wanted to go through GI procedure for esophageal stricture and possibly home with hospice. Per cardiology. 3. Persistent atrial fibrillation- status post failed DCCV despite amiodarone on 08/10/2014. Patient remained in A. fib with rate controlled. Amiodarone has been stopped per cardiology. Rate seems to be controlled. Per cardiology. 4. Community-acquired pneumonia--- clinical improvement. Patient with improved oxygenation requirements. Patient is status post 7 days of IV antibiotics. No further antibiotics needed at this time. 5. Dysphagia- patient underwent Esophogram, which showed distal esophageal obstruction versus spasm. GI has seen the patient and are discussing options with the patient's family. Patient was also seen by palliative care from their note seems as if patient and family want to go through procedure for esophageal stricture at this time and as such awaiting GI procedure once INR is subtherapeutic. Continue current full liquid diet. Per GI.  6. History of mechanical aortic valve- patient currently on warfarin, will start IV heparin when INR less than  2 7. History of alcohol abuse- continue thiamine 8. Hypokalemia- will repleted. Keep magnesium greater than 2. 9. Acute on chronic thrombocytopenia- stable. Follow.  10. DVT prophylaxis- INR therapeutic. Was on Coumadin. Will transition to heparin once INR less than 2.   Code Status: Full code Family Communication: Updated patient and wife at bedside. Disposition Plan: Transfer to telemetry. Home with hospice once medically stable, and when okay with cardiology and after GI procedure.   Consultants:  Cardiology: Dr Haroldine Laws 08/09/2014  Gastroenterology: Dr. Cristina Gong 08/11/2014   Palliative care: Imelda Pillow, NP 08/13/2014  Procedures:  Esophogram 08/11/2014  Chest x-ray 08/08/2014, 08/09/2014, 08/11/2014  TEE with cardioversion (unsuccessful) 08/10/2014 Dr Sallyanne Kuster  Antibiotics:  IV azithromycin 08/08/2014>>>>>> 08/15/2014  IV Rocephin 08/08/2014>>>>>>> 08/15/2014  HPI/Subjective: 79 y.o. male with Past medical history of left bundle-branch block, chronic systolic CHF, LVEF 09%, aortic replacement with mechanical valve, chronic anticoagulation, atrial fibrillation, alcohol use., presented with complaints of shortness of breath. Wife reported the patient has been becoming more tired fatigue and lethargic and also has developed cough with sputum production, also had some shaking chills.Admitted with CAP and CHF. Esophogram  Showed  possible distal esophageal mass versus spasm. Gastroenterology was consulted, and patient seen by Dr. Cristina Gong. Currently awaiting INR to be subtherapeutic prior to upper endoscopy with intervention per GI. Patient is currently nothing by mouth. Cardiology following.     Patient denies any chest pain. No shortness of breath. Tolerating current diet.   Objective: Filed Vitals:   08/15/14 0700  BP:   Pulse:   Temp: 97.8 F (36.6 C)  Resp:     Intake/Output Summary (Last 24 hours) at 08/15/14 1102 Last data filed at 08/15/14 0400   Gross per 24 hour  Intake    120 ml  Output  600 ml  Net   -480 ml   Filed Weights   08/08/14 1843 08/09/14 0036  Weight: 89.721 kg (197 lb 12.8 oz) 89.7 kg (197 lb 12 oz)    Exam:   General:  Appears in no acute distress    Cardiovascular: Irregularly irregular  Respiratory: Clear to auscultation bilaterally  Abdomen: Soft, nontender, no organomegaly  Musculoskeletal: Trace to Bilateral 1+ edema of the lower extremities  Data Reviewed: Basic Metabolic Panel:  Recent Labs Lab 08/11/14 1439 08/12/14 0244 08/13/14 0257 08/14/14 0224 08/15/14 0250  NA 139 138 136 139 136  K 4.3 3.7 3.4* 3.2* 4.0  CL 96* 95* 93* 94* 94*  CO2 32 33* 34* 36* 33*  GLUCOSE 115* 99 117* 87 98  BUN 18 18 16 13 13   CREATININE 0.99 0.87 0.89 0.84 0.87  CALCIUM 8.6* 8.3* 8.4* 8.2* 8.2*  MG  --   --   --  1.7 2.0   Liver Function Tests:  Recent Labs Lab 08/08/14 2328 08/09/14 0745  AST 36 28  ALT 30 28  ALKPHOS 96 82  BILITOT 1.4* 1.5*  PROT 5.6* 5.1*  ALBUMIN 3.1* 2.9*   No results for input(s): LIPASE, AMYLASE in the last 168 hours. No results for input(s): AMMONIA in the last 168 hours. CBC:  Recent Labs Lab 08/08/14 1853 08/08/14 2328 08/09/14 0745 08/12/14 0244 08/14/14 0224 08/15/14 0250  WBC 14.7*  --  8.7 6.3 6.2 6.5  NEUTROABS  --  11.7* 6.7  --   --   --   HGB 14.7  --  11.4* 12.6* 12.4* 12.4*  HCT 44.5  --  34.6* 38.9* 38.2* 38.1*  MCV 94.7  --  94.0 95.6 95.0 95.0  PLT 123*  --  99* 107* 120* 125*   Cardiac Enzymes: No results for input(s): CKTOTAL, CKMB, CKMBINDEX, TROPONINI in the last 168 hours. BNP (last 3 results)  Recent Labs  08/09/14 0745  BNP 2479.2*    ProBNP (last 3 results) No results for input(s): PROBNP in the last 8760 hours.  CBG:  Recent Labs Lab 08/09/14 1757  GLUCAP 104*    Recent Results (from the past 240 hour(s))  Culture, blood (routine x 2)     Status: None   Collection Time: 08/08/14  9:10 PM  Result Value Ref  Range Status   Specimen Description BLOOD LEFT ARM  Final   Special Requests BOTTLES DRAWN AEROBIC AND ANAEROBIC 5CC  Final   Culture NO GROWTH 5 DAYS  Final   Report Status 08/13/2014 FINAL  Final  Culture, blood (routine x 2)     Status: None   Collection Time: 08/08/14  9:15 PM  Result Value Ref Range Status   Specimen Description BLOOD RIGHT WRIST  Final   Special Requests BOTTLES DRAWN AEROBIC AND ANAEROBIC 5CC  Final   Culture NO GROWTH 5 DAYS  Final   Report Status 08/13/2014 FINAL  Final  MRSA PCR Screening     Status: None   Collection Time: 08/09/14  1:49 AM  Result Value Ref Range Status   MRSA by PCR NEGATIVE NEGATIVE Final    Comment:        The GeneXpert MRSA Assay (FDA approved for NASAL specimens only), is one component of a comprehensive MRSA colonization surveillance program. It is not intended to diagnose MRSA infection nor to guide or monitor treatment for MRSA infections.      Studies: No results found.  Scheduled Meds: . allopurinol  300 mg  Oral Daily  . antiseptic oral rinse  7 mL Mouth Rinse q12n4p  . aspirin EC  81 mg Oral Daily  . chlorhexidine gluconate  15 mL Mouth Rinse BID  . donepezil  23 mg Oral QHS  . furosemide  40 mg Oral Daily  . guaifenesin  600 mg Oral 4 times per day  . memantine  28 mg Oral Daily  . rosuvastatin  5 mg Oral Daily  . sodium chloride  3 mL Intravenous Q12H  . thiamine  100 mg Oral BID  . traZODone  400 mg Oral QHS   Continuous Infusions: . sodium chloride      Principal Problem:   Acute respiratory failure with hypoxia Active Problems:   Atrial fibrillation   Acute on chronic systolic heart failure   Community acquired pneumonia   Biventricular implantable cardioverter-defibrillator in situ   Long term current use of anticoagulant therapy   Sepsis   Alcohol use   Dysphagia   Palliative care encounter   Cognitive impairment    Time spent: 35 min    [facility] MD [facility] Pager  [phone]. If 7PM-7AM, please contact night-coverage at [email or web address], password [facility] 08/15/2014, 11:02 AM  LOS: 7 days

## 2014-08-15 NOTE — Progress Notes (Signed)
SUBJECTIVE: The patient is doing well today.  At this time, he denies chest pain, shortness of breath, or any new concerns. Some intolerance of full liquids, but overall tolerating ok. Pending EGD when INR <2  CURRENT MEDICATIONS: . allopurinol  300 mg Oral Daily  . antiseptic oral rinse  7 mL Mouth Rinse q12n4p  . aspirin EC  81 mg Oral Daily  . chlorhexidine gluconate  15 mL Mouth Rinse BID  . donepezil  23 mg Oral QHS  . furosemide  40 mg Oral Daily  . guaifenesin  600 mg Oral 4 times per day  . memantine  28 mg Oral Daily  . rosuvastatin  5 mg Oral Daily  . sodium chloride  3 mL Intravenous Q12H  . thiamine  100 mg Oral BID  . traZODone  400 mg Oral QHS   . sodium chloride      OBJECTIVE: Physical Exam: Filed Vitals:   08/15/14 0003 08/15/14 0505 08/15/14 0508 08/15/14 0700  BP: 92/70  97/67   Pulse: 67  62   Temp: 98.3 F (36.8 C) 98 F (36.7 C)  97.8 F (36.6 C)  TempSrc:  Oral  Oral  Resp: 17  16   Height:      Weight:      SpO2: 99%  98%     Intake/Output Summary (Last 24 hours) at 08/15/14 1135 Last data filed at 08/15/14 0400  Gross per 24 hour  Intake    120 ml  Output    600 ml  Net   -480 ml    Telemetry reveals atrial fib/flutter with V pacing  GEN- The patient is well appearing  Head- normocephalic, atraumatic Eyes-  Sclera clear, conjunctiva pink Ears- hearing intact Oropharynx- clear Neck- supple  Lungs- Clear to ausculation bilaterally, normal work of breathing Heart- Irregular rate and rhythm GI- soft, NT, ND, + BS Extremities- no clubbing, cyanosis, or edema Skin- no rash or lesion Psych- euthymic mood, full affect Neuro- strength and sensation are intact  LABS: Basic Metabolic Panel:  Recent Labs  08/14/14 0224 08/15/14 0250  NA 139 136  K 3.2* 4.0  CL 94* 94*  CO2 36* 33*  GLUCOSE 87 98  BUN 13 13  CREATININE 0.84 0.87  CALCIUM 8.2* 8.2*  MG 1.7 2.0   CBC:  Recent Labs  08/14/14 0224 08/15/14 0250  WBC 6.2  6.5  HGB 12.4* 12.4*  HCT 38.2* 38.1*  MCV 95.0 95.0  PLT 120* 125*    RADIOLOGY: Dg Chest Port 1v Same Day 08/11/2014   CLINICAL DATA:  Congestive heart failure/cardiomyopathy  EXAM: PORTABLE CHEST - 1 VIEW SAME DAY  COMPARISON:  August 10, 2014  FINDINGS: There is patchy bibasilar atelectatic type change. There is trace interstitial edema, marginally less than on the study from 1 day prior. No new opacity. Heart is slightly enlarged with pulmonary vascularity within normal limits. Pacemaker leads are attached to the right atrium, right ventricle, and left ventricle. There is atherosclerotic change in aorta. No adenopathy. No pneumothorax.  IMPRESSION: Trace interstitial edema remains, slightly less than 1 day prior. There is patchy bibasilar atelectasis. No new opacity. Heart prominent but stable. Suspect mild residual congestive heart failure.   Electronically Signed   By: Lowella Grip III M.D.   On: 08/11/2014 07:44   ASSESSMENT AND PLAN:  Principal Problem:   Acute respiratory failure with hypoxia Active Problems:   Biventricular implantable cardioverter-defibrillator in situ   Atrial fibrillation   Acute  on chronic systolic heart failure   Long term current use of anticoagulant therapy   Community acquired pneumonia   Sepsis   Alcohol use   Dysphagia   Palliative care encounter   Cognitive impairment   1. Atrial flutter Failed DCCV despite amiodarone Likelihood of maintaining SR at this point is low Will pursue rhythm control long term Continue Warfarin long term for CHADS2VASC of at least 3 (on hold for now pending GI eval)  2. NICM/acute on chronic systolic heart failure Volume status stable Continue IV lasix for now until GI eval complete Tachy therapies on ICD disabled Palliative care consult appreciated  3. Aspiration GI work up pending INR <2  Chanetta Marshall, NP 08/15/2014 11:39 AM    Agree euvolemic  Await assessment of swallowing abiity and keep on IV  diuretics to avoid unncessary aspiration risk

## 2014-08-15 NOTE — Progress Notes (Signed)
Patient ID: Jon Butler, male   DOB: [birth year], 79 y.o.   MRN: [id number]  Tolerating clear liquids. No complaints. No family at bedside at this time.  INR 2.42 (2.27).  Will change to full liquids. Plan for EGD with Botox injection when INR less than 2 (tentatively scheduled for 08/17/14).

## 2014-08-15 NOTE — Progress Notes (Signed)
ANTICOAGULATION CONSULT NOTE - Follow-Up Consult  Pharmacy Consult for Heparin when INR <2 Indication: afib, mechanical aortic valve  No Known Allergies  Patient Measurements: Height: 5\' 9"  (175.3 cm) Weight: 197 lb 12 oz (89.7 kg) IBW/kg (Calculated) : 70.7 Heparin Dosing Weight: 88 kg  Vital Signs: Temp: 97.9 F (36.6 C) (08/09 1100) Temp Source: Oral (08/09 1100) BP: 104/69 mmHg (08/09 1123) Pulse Rate: 62 (08/09 0839)  Labs:  Recent Labs  08/13/14 0257 08/14/14 0224 08/15/14 0250  HGB  --  12.4* 12.4*  HCT  --  38.2* 38.1*  PLT  --  120* 125*  LABPROT 25.5* 24.8* 26.0*  INR 2.35* 2.27* 2.42*  CREATININE 0.89 0.84 0.87    Estimated Creatinine Clearance: 80 mL/min (by C-G formula based on Cr of 0.87).   Medical History: Past Medical History  Diagnosis Date  . Left bundle-branch block   . Cardiomyopathy   . Chronic systolic heart failure   . Atrial fibrillation     Medications:  Scheduled:  . allopurinol  300 mg Oral Daily  . antiseptic oral rinse  7 mL Mouth Rinse q12n4p  . aspirin EC  81 mg Oral Daily  . chlorhexidine gluconate  15 mL Mouth Rinse BID  . donepezil  23 mg Oral QHS  . furosemide  40 mg Oral Daily  . guaifenesin  600 mg Oral 4 times per day  . memantine  28 mg Oral Daily  . rosuvastatin  5 mg Oral Daily  . sodium chloride  3 mL Intravenous Q12H  . thiamine  100 mg Oral BID  . traZODone  400 mg Oral QHS   Infusions:  . sodium chloride      Assessment: 79 yo M on Coumadin PTA for hx afib and mechanical aortic valve.  Coumadin on hold for EGD with plans to bridge with heparin.  Will start heparin when INR <2.  Goal of Therapy:  INR 2-3 Heparin level 0.3-0.7 units/ml Monitor platelets by anticoagulation protocol: Yes   Plan:  No heparin at this time. INR in AM  [employer], Pharm.D., BCPS Clinical Pharmacist Pager [phone] 08/15/2014 1:04 PM

## 2014-08-15 NOTE — Progress Notes (Signed)
CM spoke with wife  regarding  [place] and  provided choice list . Wife stated someone from the palliative team will be meeting with she and son [name] before any decisions are further made, however  stated plan is for pt  to be d/c with home hospice. CM to f/u with disposition needs. Whitman Hero RN,BSN,CM [phone]

## 2014-08-16 ENCOUNTER — Encounter: Payer: Self-pay | Admitting: Internal Medicine

## 2014-08-16 DIAGNOSIS — F1099 Alcohol use, unspecified with unspecified alcohol-induced disorder: Secondary | ICD-10-CM

## 2014-08-16 LAB — ABO/RH: ABO/RH(D): B POS

## 2014-08-16 LAB — BASIC METABOLIC PANEL
Anion gap: 8 (ref 5–15)
BUN: 15 mg/dL (ref 6–20)
CALCIUM: 8.5 mg/dL — AB (ref 8.9–10.3)
CO2: 33 mmol/L — ABNORMAL HIGH (ref 22–32)
Chloride: 96 mmol/L — ABNORMAL LOW (ref 101–111)
Creatinine, Ser: 0.86 mg/dL (ref 0.61–1.24)
GFR calc Af Amer: >60 mL/min (ref >60)
GLUCOSE: 85 mg/dL (ref 65–99)
Potassium: 4.2 mmol/L (ref 3.5–5.1)
Sodium: 137 mmol/L (ref 135–145)

## 2014-08-16 LAB — PROTIME-INR
INR: 2.51 — ABNORMAL HIGH (ref 0.00–1.49)
INR: 2.27 — ABNORMAL HIGH (ref 0.00–1.49)
INR: 1.78 — ABNORMAL HIGH (ref 0.00–1.49)
PROTHROMBIN TIME: 24.9 s — AB (ref 11.6–15.2)
PROTHROMBIN TIME: 20.6 s — AB (ref 11.6–15.2)
Prothrombin Time: 26.8 seconds — ABNORMAL HIGH (ref 11.6–15.2)

## 2014-08-16 MED ORDER — HEPARIN (PORCINE) IN NACL 100-0.45 UNIT/ML-% IJ SOLN
1300.0000 [IU]/h | INTRAMUSCULAR | Status: DC
  Administered 2014-08-16: 1000 [IU]/h via INTRAVENOUS
  Filled 2014-08-16: qty 250

## 2014-08-16 MED ORDER — SODIUM CHLORIDE 0.9 % IV SOLN
Freq: Once | INTRAVENOUS | Status: AC
  Administered 2014-08-16: 12:00:00 via INTRAVENOUS

## 2014-08-16 NOTE — Progress Notes (Signed)
Patient ID: Jon Butler, male   DOB: [birth year], 79 y.o.   MRN: [id number] [facility]astroenterology Progress Note  Jon Butler 79 y.o. 22-Apr-1935   Subjective: Feels ok. Tolerating full liquids. Wife and son at bedside. Denies abdominal pain.  Objective: Vital signs in last 24 hours: Filed Vitals:   08/16/14 0802  BP: 117/70  Pulse: 65  Temp: 98.6 F (37 C)  Resp: 17    Physical Exam: Gen: alert, no acute distress CV: RRR Chest: CTA B Abd: soft, nontender, nondistended, +BS  Lab Results:  Recent Labs  08/14/14 0224 08/15/14 0250 08/16/14 0510  NA 139 136 137  K 3.2* 4.0 4.2  CL 94* 94* 96*  CO2 36* 33* 33*  GLUCOSE 87 98 85  BUN 13 13 15   CREATININE 0.84 0.87 0.86  CALCIUM 8.2* 8.2* 8.5*  MG 1.7 2.0  --    No results for input(s): AST, ALT, ALKPHOS, BILITOT, PROT, ALBUMIN in the last 72 hours.  Recent Labs  08/14/14 0224 08/15/14 0250  WBC 6.2 6.5  HGB 12.4* 12.4*  HCT 38.2* 38.1*  MCV 95.0 95.0  PLT 120* 125*    Recent Labs  08/15/14 0250 08/16/14 0510  LABPROT 26.0* 26.8*  INR 2.42* 2.51*      Assessment/Plan: Dysphagia question Achalasia vs spasm. Plan for EGD with Botox when INR < 2. INR has risen the last 2 days and will need to correct with FFP in order to do the EGD. Discussed with Dr. Caryl Comes and risk of stroke increased by using FFP and ok with that plan if family understands risk. Discussed increased risk of stroke with family by use of FFP and since overall goal is to have patient home and having a better chance of eating, family and patient agreeable with this plan. Will give FFP today. NPO p MN. EGD with Botox tomorrow morning by Dr. Penelope Coop.   [place] C. 08/16/2014, 9:44 AM  Pager [phone]  If no answer or after 5 PM call [phone]

## 2014-08-16 NOTE — Progress Notes (Signed)
08/16/2014 patient signed both blood administration and surgical consent forms for his EGD. Theodosia Paling, RN

## 2014-08-16 NOTE — Progress Notes (Signed)
ANTICOAGULATION CONSULT NOTE - Follow-Up Consult  Pharmacy Consult for Heparin when INR <2 Indication: h/o afib, mechanical aortic valve  No Known Allergies  Patient Measurements: Height: 5\' 9"  (175.3 cm) Weight: 194 lb 10.7 oz (88.3 kg) IBW/kg (Calculated) : 70.7 Heparin Dosing Weight: 88 kg  Vital Signs: Temp: 97.9 F (36.6 C) (08/10 2010) Temp Source: Oral (08/10 2010) BP: 109/76 mmHg (08/10 2010) Pulse Rate: 67 (08/10 2010)  Labs:  Recent Labs  08/14/14 0224 08/15/14 0250 08/16/14 0510 08/16/14 1131 08/16/14 1803  HGB 12.4* 12.4*  --   --   --   HCT 38.2* 38.1*  --   --   --   PLT 120* 125*  --   --   --   LABPROT 24.8* 26.0* 26.8* 24.9* 20.6*  INR 2.27* 2.42* 2.51* 2.27* 1.78*  CREATININE 0.84 0.87 0.86  --   --     Estimated Creatinine Clearance: 80.3 mL/min (by C-G formula based on Cr of 0.86).   Assessment: 79 yo M on Coumadin PTA for hx afib and mechanical aortic valve.  Coumadin on hold for EGD with plans to bridge with heparin. INR tonight is down to 1.78 after receivine 2 units FFP. Heparin IV infusio to start when INR <2, thus will start IV heparin drip now.  hgb and plts low but stable. No bleeding noted.  Goal of Therapy:  INR 2-3 Heparin level 0.3-0.7 units/ml Monitor platelets by anticoagulation protocol: Yes   Plan:  -Start heparin drip (no bolus) at 1000 units/hr.  -Check 6 hour heparin level -Daily heparin level, INR and CBC.  -follow for resumption of warfarin after procedure -follow for s/s bleeding  Nicole Cella, RPh Clinical Pharmacist Pager: [phone] 08/16/2014 8:15 PM

## 2014-08-16 NOTE — Progress Notes (Addendum)
[facility] PROGRESS NOTE  Jon Butler ZOX:[id number] DOB: 1935/06/27 DOA: 08/08/2014 PCP: [name], MD  Assessment/Plan: 1. Acute hypoxic respiratory failure- multifactorial due to community acquired pneumonia as well as systolic CHF. Improving clinically. Patient currently 98% on 2 L nasal cannula. Will change IV Lasix to oral Lasix. Patient is status post 7 days of IV antibiotics and has appropriately been treated. Follow. 2. Acute on chronic systolic CHF- patient has nonischemic cardiomyopathy with EF 20%, followed by cardiology. I/O = 780cc/24hrs. patient currently on full liquids with aspiration precautions, with high aspiration risk until INR is subtherapeutic for GI procedure. Change IV Lasix to oral home dose.Patient has been seen in consultation by palliative care and patient currently a DO NOT RESUSCITATE. Patient wanted to go through GI procedure for esophageal stricture and possibly home with hospice. Per cardiology. 3. Persistent atrial fibrillation- status post failed DCCV despite amiodarone on 08/10/2014. Patient remained in A. fib with rate controlled. Amiodarone has been stopped per cardiology. Rate seems to be controlled. Per cardiology. 4. Community-acquired pneumonia--- clinical improvement. Patient with improved oxygenation requirements. Patient is status post 7 days of IV antibiotics. No further antibiotics needed at this time. 5. Dysphagia- patient underwent Esophogram, which showed distal esophageal obstruction versus spasm. GI has seen the patient and are discussing options with the patient's family. Patient was also seen by palliative care from their note seems as if patient and family want to go through procedure for esophageal stricture at this time and as such awaiting GI procedure once INR is subtherapeutic. Patient receiving FFP today in order to reverse INR for possible procedure tomorrow. Continue current full liquid diet. Per GI.  6. History of mechanical  aortic valve- patient currently on warfarin, will start IV heparin when INR less than 2 7. History of alcohol abuse- continue thiamine 8. Hypokalemia- will repleted. Keep magnesium greater than 2. 9. Acute on chronic thrombocytopenia- stable. Follow.  10. DVT prophylaxis- INR therapeutic. Was on Coumadin. Will transition to heparin once INR less than 2.   Code Status: Full code Family Communication: Updated patient and wife at bedside. Disposition Plan: Transfer to telemetry. Home with hospice once medically stable, and when okay with cardiology and after GI procedure.   Consultants:  Cardiology: Dr Haroldine Laws 08/09/2014  Gastroenterology: Dr. Cristina Gong 08/11/2014   Palliative care: Imelda Pillow, NP 08/13/2014  Procedures:  Esophogram 08/11/2014  Chest x-ray 08/08/2014, 08/09/2014, 08/11/2014  TEE with cardioversion (unsuccessful) 08/10/2014 Dr Sallyanne Kuster  Antibiotics:  IV azithromycin 08/08/2014>>>>>> 08/15/2014  IV Rocephin 08/08/2014>>>>>>> 08/15/2014  HPI/Subjective: 79 y.o. male with Past medical history of left bundle-branch block, chronic systolic CHF, LVEF 81%, aortic replacement with mechanical valve, chronic anticoagulation, atrial fibrillation, alcohol use., presented with complaints of shortness of breath. Wife reported the patient has been becoming more tired fatigue and lethargic and also has developed cough with sputum production, also had some shaking chills.Admitted with CAP and CHF. Esophogram  Showed  possible distal esophageal mass versus spasm. Gastroenterology was consulted, and patient seen by Dr. Cristina Gong. Currently awaiting INR to be subtherapeutic prior to upper endoscopy with intervention per GI. Patient is currently nothing by mouth. Cardiology following.     Patient denies any chest pain. No shortness of breath. Tolerating current diet.   Objective: Filed Vitals:   08/16/14 1532  BP: 100/71  Pulse: 62  Temp: 98 F (36.7 C)  Resp: 17     Intake/Output Summary (Last 24 hours) at 08/16/14 1659 Last data filed at 08/16/14 1532  Gross per  24 hour  Intake   1155 ml  Output    673 ml  Net    482 ml   Filed Weights   08/08/14 1843 08/09/14 0036 08/16/14 0551  Weight: 89.721 kg (197 lb 12.8 oz) 89.7 kg (197 lb 12 oz) 84.7 kg (186 lb 11.7 oz)    Exam:   General:  Appears in no acute distress    Cardiovascular: Irregularly irregular  Respiratory: Clear to auscultation bilaterally  Abdomen: Soft, nontender, no organomegaly  Musculoskeletal: Trace to Bilateral 1+ edema of the lower extremities  Data Reviewed: Basic Metabolic Panel:  Recent Labs Lab 08/12/14 0244 08/13/14 0257 08/14/14 0224 08/15/14 0250 08/16/14 0510  NA 138 136 139 136 137  K 3.7 3.4* 3.2* 4.0 4.2  CL 95* 93* 94* 94* 96*  CO2 33* 34* 36* 33* 33*  GLUCOSE 99 117* 87 98 85  BUN 18 16 13 13 15   CREATININE 0.87 0.89 0.84 0.87 0.86  CALCIUM 8.3* 8.4* 8.2* 8.2* 8.5*  MG  --   --  1.7 2.0  --    Liver Function Tests: No results for input(s): AST, ALT, ALKPHOS, BILITOT, PROT, ALBUMIN in the last 168 hours. No results for input(s): LIPASE, AMYLASE in the last 168 hours. No results for input(s): AMMONIA in the last 168 hours. CBC:  Recent Labs Lab 08/12/14 0244 08/14/14 0224 08/15/14 0250  WBC 6.3 6.2 6.5  HGB 12.6* 12.4* 12.4*  HCT 38.9* 38.2* 38.1*  MCV 95.6 95.0 95.0  PLT 107* 120* 125*   Cardiac Enzymes: No results for input(s): CKTOTAL, CKMB, CKMBINDEX, TROPONINI in the last 168 hours. BNP (last 3 results)  Recent Labs  08/09/14 0745  BNP 2479.2*    ProBNP (last 3 results) No results for input(s): PROBNP in the last 8760 hours.  CBG:  Recent Labs Lab 08/09/14 1757  GLUCAP 104*    Recent Results (from the past 240 hour(s))  Culture, blood (routine x 2)     Status: None   Collection Time: 08/08/14  9:10 PM  Result Value Ref Range Status   Specimen Description BLOOD LEFT ARM  Final   Special Requests BOTTLES  DRAWN AEROBIC AND ANAEROBIC 5CC  Final   Culture NO GROWTH 5 DAYS  Final   Report Status 08/13/2014 FINAL  Final  Culture, blood (routine x 2)     Status: None   Collection Time: 08/08/14  9:15 PM  Result Value Ref Range Status   Specimen Description BLOOD RIGHT WRIST  Final   Special Requests BOTTLES DRAWN AEROBIC AND ANAEROBIC 5CC  Final   Culture NO GROWTH 5 DAYS  Final   Report Status 08/13/2014 FINAL  Final  MRSA PCR Screening     Status: None   Collection Time: 08/09/14  1:49 AM  Result Value Ref Range Status   MRSA by PCR NEGATIVE NEGATIVE Final    Comment:        The GeneXpert MRSA Assay (FDA approved for NASAL specimens only), is one component of a comprehensive MRSA colonization surveillance program. It is not intended to diagnose MRSA infection nor to guide or monitor treatment for MRSA infections.      Studies: No results found.  Scheduled Meds: . allopurinol  300 mg Oral Daily  . antiseptic oral rinse  7 mL Mouth Rinse q12n4p  . aspirin EC  81 mg Oral Daily  . chlorhexidine gluconate  15 mL Mouth Rinse BID  . donepezil  23 mg Oral QHS  . furosemide  20 mg Intravenous Daily  . guaifenesin  600 mg Oral 4 times per day  . memantine  28 mg Oral Daily  . rosuvastatin  5 mg Oral Daily  . sodium chloride  3 mL Intravenous Q12H  . thiamine  100 mg Oral BID  . traZODone  400 mg Oral QHS   Continuous Infusions: . sodium chloride      Principal Problem:   Acute respiratory failure with hypoxia Active Problems:   Atrial fibrillation   Acute on chronic systolic heart failure   Community acquired pneumonia   Biventricular implantable cardioverter-defibrillator in situ   Long term current use of anticoagulant therapy   Sepsis   Alcohol use   Dysphagia   Palliative care encounter   Cognitive impairment    Time spent: 82 min    [facility] MD [facility] Pager [phone]. If 7PM-7AM, please contact night-coverage at [email or web address], password  [facility] 08/16/2014, 4:59 PM  LOS: 8 days

## 2014-08-16 NOTE — Progress Notes (Signed)
Late entry:  Pt transferred to 6e08. Orders have been reviewed and implemented. Telemetry applied, CCMD notified. Patient and family oriented to unit and unit staff. Patient lying in bed with no complaints at this time; call light within reach and bed alarm activated, will continue to monitor.  Shelbie Hutching, RN, BSN

## 2014-08-16 NOTE — Progress Notes (Signed)
ANTICOAGULATION CONSULT NOTE - Follow-Up Consult  Pharmacy Consult for Heparin when INR <2 Indication: afib, mechanical aortic valve  No Known Allergies  Patient Measurements: Height: 5\' 9"  (175.3 cm) Weight: 186 lb 11.7 oz (84.7 kg) IBW/kg (Calculated) : 70.7 Heparin Dosing Weight: 88 kg  Vital Signs: Temp: 98 F (36.7 C) (08/10 1240) Temp Source: Oral (08/10 1240) BP: 93/58 mmHg (08/10 1240) Pulse Rate: 59 (08/10 1240)  Labs:  Recent Labs  08/14/14 0224 08/15/14 0250 08/16/14 0510 08/16/14 1131  HGB 12.4* 12.4*  --   --   HCT 38.2* 38.1*  --   --   PLT 120* 125*  --   --   LABPROT 24.8* 26.0* 26.8* 24.9*  INR 2.27* 2.42* 2.51* 2.27*  CREATININE 0.84 0.87 0.86  --     Estimated Creatinine Clearance: 73.1 mL/min (by C-G formula based on Cr of 0.86).   Assessment: 79 yo M on Coumadin PTA for hx afib and mechanical aortic valve.  Coumadin on hold for EGD with plans to bridge with heparin. INR this morning was 2.5 and GI made decision to give 2 units FFP. INR at 1100 was down to 2.27- does not appear FFP has been given yet. To start heparin when INR <2.  hgb and plts low but stable. No bleeding noted.  Goal of Therapy:  INR 2-3 Heparin level 0.3-0.7 units/ml Monitor platelets by anticoagulation protocol: Yes   Plan:  -repeat INR at 1800 -start heparin when INR <2 -follow for resumption of warfarin after procedure -follow for s/s bleeding  .Warrick Llera D. Orval Dortch, PharmD, BCPS Clinical Pharmacist Pager: [phone] 08/16/2014 1:38 PM

## 2014-08-17 ENCOUNTER — Encounter (HOSPITAL_COMMUNITY): Payer: Self-pay | Admitting: Anesthesiology

## 2014-08-17 DIAGNOSIS — Z954 Presence of other heart-valve replacement: Secondary | ICD-10-CM

## 2014-08-17 DIAGNOSIS — Z952 Presence of prosthetic heart valve: Secondary | ICD-10-CM | POA: Diagnosis present

## 2014-08-17 LAB — PREPARE FRESH FROZEN PLASMA
Unit division: 0
Unit division: 0

## 2014-08-17 LAB — BASIC METABOLIC PANEL
Anion gap: 9 (ref 5–15)
BUN: 14 mg/dL (ref 6–20)
CO2: 33 mmol/L — AB (ref 22–32)
Calcium: 8.7 mg/dL — ABNORMAL LOW (ref 8.9–10.3)
Chloride: 94 mmol/L — ABNORMAL LOW (ref 101–111)
Creatinine, Ser: 0.86 mg/dL (ref 0.61–1.24)
GFR calc Af Amer: >60 mL/min (ref >60)
GFR calc non Af Amer: >60 mL/min (ref >60)
GLUCOSE: 91 mg/dL (ref 65–99)
POTASSIUM: 4 mmol/L (ref 3.5–5.1)
Sodium: 136 mmol/L (ref 135–145)

## 2014-08-17 LAB — CBC
HEMATOCRIT: 39.7 % (ref 39.0–52.0)
HEMOGLOBIN: 12.8 g/dL — AB (ref 13.0–17.0)
MCH: 30.9 pg (ref 26.0–34.0)
MCHC: 32.2 g/dL (ref 30.0–36.0)
MCV: 95.9 fL (ref 78.0–100.0)
Platelets: 125 10*3/uL — ABNORMAL LOW (ref 150–400)
RBC: 4.14 MIL/uL — ABNORMAL LOW (ref 4.22–5.81)
RDW: 16.8 % — ABNORMAL HIGH (ref 11.5–15.5)
WBC: 6.4 10*3/uL (ref 4.0–10.5)

## 2014-08-17 LAB — PROTIME-INR
INR: 1.77 — ABNORMAL HIGH (ref 0.00–1.49)
Prothrombin Time: 20.6 seconds — ABNORMAL HIGH (ref 11.6–15.2)

## 2014-08-17 LAB — HEPARIN LEVEL (UNFRACTIONATED)
Heparin Unfractionated: 0.13 IU/mL — ABNORMAL LOW (ref 0.30–0.70)
Heparin Unfractionated: <0.1 IU/mL — ABNORMAL LOW (ref 0.30–0.70)

## 2014-08-17 MED ORDER — HEPARIN (PORCINE) IN NACL 100-0.45 UNIT/ML-% IJ SOLN
1350.0000 [IU]/h | INTRAMUSCULAR | Status: DC
  Administered 2014-08-17: 1300 [IU]/h via INTRAVENOUS
  Filled 2014-08-17: qty 250

## 2014-08-17 NOTE — Progress Notes (Signed)
ANTICOAGULATION CONSULT NOTE - Follow-Up Consult  Pharmacy Consult for Heparin when INR <2 Indication: h/o afib, mechanical aortic valve  No Known Allergies  Patient Measurements: Height: 5\' 9"  (175.3 cm) Weight: 194 lb 10.7 oz (88.3 kg) IBW/kg (Calculated) : 70.7 Heparin Dosing Weight: 88 kg  Vital Signs: Temp: 98.2 F (36.8 C) (08/11 0345) Temp Source: Oral (08/11 0345) BP: 126/74 mmHg (08/11 0345) Pulse Rate: 65 (08/11 0345)  Labs:  Recent Labs  08/15/14 0250 08/16/14 0510 08/16/14 1131 08/16/14 1803 08/17/14 0707  HGB 12.4*  --   --   --   --   HCT 38.1*  --   --   --   --   PLT 125*  --   --   --   --   LABPROT 26.0* 26.8* 24.9* 20.6* 20.6*  INR 2.42* 2.51* 2.27* 1.78* 1.77*  HEPARINUNFRC  --   --   --   --  0.13*  CREATININE 0.87 0.86  --   --   --     Estimated Creatinine Clearance: 80.3 mL/min (by C-G formula based on Cr of 0.86).   Assessment: 79 yo M on Coumadin PTA for hx afib and mechanical aortic valve.  Coumadin on hold for EGD - pt on heparin bridge. INR 1.77. Heparin level 0.13 (subtherapeutic) on 1000 units/hr. No bleeding noted.  Goal of Therapy:  INR 2-3 Heparin level 0.3-0.7 units/ml Monitor platelets by anticoagulation protocol: Yes   Plan:  -Increase heparin drip to 1300 units/hr.  -Check 8 hour heparin level  Sherlon Handing, PharmD, BCPS Clinical pharmacist, pager [phone] 08/17/2014 7:41 AM

## 2014-08-17 NOTE — Progress Notes (Signed)
08/17/2014 3:24 PM  This is a late entry note summarizing the events of today's interactions with the patient's son, [name].  In brief, heparin drip was started on the patient last night and was not discontinued in anticipation for EGD that was scheduled for this am.  I was called to the room by [name] this morning to talk through what had happened.  I explained in detail what had happened and that we were awaiting a call back from Dr. Penelope Coop for rescheduling.  I apologized numerous times and although the son seemed to comprehend what had happened, he still wanted to speak with a doctor.  Dr. Grandville Silos was paged and came up shortly thereafter to speak with the patient.  He, too, reviewed everything that had happened leading up to this morning's events with the son and apologized for the misunderstanding.  He also relayed to [facility] that Dr. Penelope Coop would not be able to perform the procedure today due to scheduling limitations with anesthesiology. At this point, [name] insisted that the heparin drip be turned off for a few hours so that he could "call around and see if he could find someone to perform the procedure since he worked here for 15 years and he knew people."  Dr. Grandville Silos obliged and discontinued the heparin at this time.  A few hours later, Dr. Penelope Coop was able to come up and speak with the son and patient.  Dr. Penelope Coop explained what had happened earlier today (again) and stated that we would restart the heparin immediately and DC tomorrow morning at 7am in anticipation of the procedure early tomorrow afternoon.  After Dr. Penelope Coop left, the son approached the first desk and asked for me again.  He stated that he wanted to file a formal complaint and he wanted a case manager to speak with to perform a chart audit to see what had led to the breakdown in communication that delayed the procedure from being done today.  I provided him with the number to the office of patient experience, as well as risk management.  The  Surveyor, quantity of [place], [place], also spoke at [employer] with [name] and gave him her contact information.  [name] stated that he would attempt to get in touch with the office of patient experience shortly.  Dr. Grandville Silos made aware of interactions and wrote orders accordingly to prepare for procedure tomorrow.  Princella Pellegrini

## 2014-08-17 NOTE — Care Management Important Message (Signed)
Important Message  Patient Details  Name: Jon Butler MRN: [id number] Date of Birth: 04-17-1935   Medicare Important Message Given:  Yes-fourth notification given    Delorse Lek 08/17/2014, 2:59 PM

## 2014-08-17 NOTE — Anesthesia Preprocedure Evaluation (Addendum)
Anesthesia Evaluation  Patient identified by MRN, date of birth, ID band Patient awake    Reviewed: Allergy & Precautions, NPO status , Patient's Chart, lab work & pertinent test results, reviewed documented beta blocker date and time   Airway Mallampati: II  TM Distance: >3 FB Neck ROM: Full    Dental  (+) Teeth Intact   Pulmonary pneumonia -, unresolved,  breath sounds clear to auscultation        Cardiovascular Exercise Tolerance: Poor + dysrhythmias Atrial Fibrillation Rhythm:Irregular Rate:Abnormal  Systolic HF w/ EF 16%, ICD present   Neuro/Psych negative neurological ROS  negative psych ROS   GI/Hepatic negative GI ROS, Neg liver ROS,   Endo/Other    Renal/GU   negative genitourinary   Musculoskeletal negative musculoskeletal ROS (+)   Abdominal   Peds negative pediatric ROS (+)  Hematology negative hematology ROS (+)   Anesthesia Other Findings   Reproductive/Obstetrics negative OB ROS                           Anesthesia Physical Anesthesia Plan  ASA: III  Anesthesia Plan: MAC   Post-op Pain Management:    Induction: Intravenous  Airway Management Planned: Natural Airway  Additional Equipment:   Intra-op Plan:   Post-operative Plan:   Informed Consent: I have reviewed the patients History and Physical, chart, labs and discussed the procedure including the risks, benefits and alternatives for the proposed anesthesia with the patient or authorized representative who has indicated his/her understanding and acceptance.   Dental advisory given and Consent reviewed with POA  Plan Discussed with: CRNA  Anesthesia Plan Comments:         Anesthesia Quick Evaluation          Lab Results  Component Value Date   WBC 5.1 08/18/2014   HGB 12.5* 08/18/2014   HCT 38.4* 08/18/2014   MCV 94.6 08/18/2014   PLT 124* 08/18/2014   Lab Results  Component Value Date   CREATININE 0.93 08/18/2014   BUN 12 08/18/2014   NA 137 08/18/2014   K 3.9 08/18/2014   CL 95* 08/18/2014   CO2 34* 08/18/2014   Lab Results  Component Value Date   INR 1.57* 08/18/2014   INR 1.77* 08/17/2014   INR 1.78* 08/16/2014

## 2014-08-17 NOTE — Progress Notes (Signed)
[facility] PROGRESS NOTE  Jon Butler CVE:[id number] DOB: 1935-04-18 DOA: 08/08/2014 PCP: [name], MD  Assessment/Plan: 1. Acute hypoxic respiratory failure- multifactorial due to community acquired pneumonia as well as systolic CHF. Improving clinically. Patient currently 95% on RA. Continue IV Lasix. Patient is status post 7 days of IV antibiotics and has appropriately been treated. Follow. 2. Acute on chronic systolic CHF- patient has nonischemic cardiomyopathy with EF 20%, followed by cardiology.  Patient was on full liquids with aspiration precautions, with high aspiration risk until INR became subtherapeutic for GI procedure. Continue IV Lasix 20 mg daily. Patient has been seen in consultation by palliative care and patient currently a DO NOT RESUSCITATE. Patient wanted to go through GI procedure for esophageal stricture and possibly home with hospice. Per cardiology. 3. Persistent atrial fibrillation- status post failed DCCV despite amiodarone on 08/10/2014. Patient remained in A. fib with rate controlled. Amiodarone has been stopped per cardiology. Rate seems to be controlled. Per cardiology. 4. Community-acquired pneumonia--- clinical improvement. Patient with improved oxygenation requirements. Patient is status post 7 days of IV antibiotics. No further antibiotics needed at this time. 5. Dysphagia- patient underwent Esophogram, which showed distal esophageal obstruction versus spasm. GI has seen the patient and discussed options with the patient's family. Patient was also seen by palliative care, from their note seems as if patient and family want to go through procedure for esophageal stricture at this time and as such awaiting GI procedure once INR is subtherapeutic. Patient s/p 2 units FFP yesterday in order to reverse INR for possible procedure today. INR currently at 1.77. IV heparin was started last night and not discontinued after midnight and as such procedure was canceled  for this morning. Family upset that IV heparin was not discontinued in a timely fashion and at the insistence asking for IV heparin to be discontinued now while they try to see whether procedure is able to be done later on today. Patient is currently nothing by mouth. Per GI.  6. History of mechanical aortic valve- patient currently off warfarin. Patient's INR was reversed yesterday with 2 units of FFP and IV heparin started overnight as INR was less than 2. IV heparin has been discontinued now at family's insistence while awaiting to see if GI procedure can be done later on today.  7. History of alcohol abuse- continue thiamine 8. Hypokalemia- Repleted. Keep magnesium greater than 2. 9. Acute on chronic thrombocytopenia- stable. Follow.  10. DVT prophylaxis- SCDs preprocedure as INR is now soft therapeutic. Coumadin on hold. IV heparin discontinued in anticipation of possible procedure.   Code Status: Full code Family Communication: Updated patient and wife at bedside. Disposition Plan: Home with hospice once medically stable, and when okay with cardiology and after GI procedure.   Consultants:  Cardiology: Dr Haroldine Laws 08/09/2014  Gastroenterology: Dr. Cristina Gong 08/11/2014   Palliative care: Imelda Pillow, NP 08/13/2014  Procedures:  Esophogram 08/11/2014  Chest x-ray 08/08/2014, 08/09/2014, 08/11/2014  TEE with cardioversion (unsuccessful) 08/10/2014 Dr Croitoru  2 units FFP 08/16/2014  Antibiotics:  IV azithromycin 08/08/2014>>>>>> 08/15/2014  IV Rocephin 08/08/2014>>>>>>> 08/15/2014  HPI/Subjective: 79 y.o. male with Past medical history of left bundle-branch block, chronic systolic CHF, LVEF 02%, aortic replacement with mechanical valve, chronic anticoagulation, atrial fibrillation, alcohol use., presented with complaints of shortness of breath. Wife reported the patient has been becoming more tired fatigue and lethargic and also has developed cough with sputum  production, also had some shaking chills.Admitted with CAP and CHF. Esophogram  Showed  possible distal esophageal mass versus spasm. Gastroenterology was consulted, and patient seen by Dr. Cristina Gong. Currently awaiting INR to be subtherapeutic prior to upper endoscopy with intervention per GI. Patient is currently nothing by mouth. Cardiology following.     Patient denies any chest pain. No shortness of breath. Tolerating current diet.Hasn't eaten since yesterday.   Objective: Filed Vitals:   08/17/14 0852  BP: 115/74  Pulse: 69  Temp: 98 F (36.7 C)  Resp: 18    Intake/Output Summary (Last 24 hours) at 08/17/14 1047 Last data filed at 08/17/14 1023  Gross per 24 hour  Intake 1411.32 ml  Output    250 ml  Net 1161.32 ml   Filed Weights   08/09/14 0036 08/16/14 0551 08/16/14 2010  Weight: 89.7 kg (197 lb 12 oz) 84.7 kg (186 lb 11.7 oz) 88.3 kg (194 lb 10.7 oz)    Exam:   General:  Appears in no acute distress    Cardiovascular: Irregularly irregular  Respiratory: Clear to auscultation bilaterally  Abdomen: Soft, nontender, no organomegaly  Musculoskeletal: Trace to Bilateral  edema of the lower extremities  Data Reviewed: Basic Metabolic Panel:  Recent Labs Lab 08/13/14 0257 08/14/14 0224 08/15/14 0250 08/16/14 0510 08/17/14 0707  NA 136 139 136 137 136  K 3.4* 3.2* 4.0 4.2 4.0  CL 93* 94* 94* 96* 94*  CO2 34* 36* 33* 33* 33*  GLUCOSE 117* 87 98 85 91  BUN 16 13 13 15 14   CREATININE 0.89 0.84 0.87 0.86 0.86  CALCIUM 8.4* 8.2* 8.2* 8.5* 8.7*  MG  --  1.7 2.0  --   --    Liver Function Tests: No results for input(s): AST, ALT, ALKPHOS, BILITOT, PROT, ALBUMIN in the last 168 hours. No results for input(s): LIPASE, AMYLASE in the last 168 hours. No results for input(s): AMMONIA in the last 168 hours. CBC:  Recent Labs Lab 08/12/14 0244 08/14/14 0224 08/15/14 0250 08/17/14 0707  WBC 6.3 6.2 6.5 6.4  HGB 12.6* 12.4* 12.4* 12.8*  HCT 38.9* 38.2*  38.1* 39.7  MCV 95.6 95.0 95.0 95.9  PLT 107* 120* 125* 125*   Cardiac Enzymes: No results for input(s): CKTOTAL, CKMB, CKMBINDEX, TROPONINI in the last 168 hours. BNP (last 3 results)  Recent Labs  08/09/14 0745  BNP 2479.2*    ProBNP (last 3 results) No results for input(s): PROBNP in the last 8760 hours.  CBG: No results for input(s): GLUCAP in the last 168 hours.  Recent Results (from the past 240 hour(s))  Culture, blood (routine x 2)     Status: None   Collection Time: 08/08/14  9:10 PM  Result Value Ref Range Status   Specimen Description BLOOD LEFT ARM  Final   Special Requests BOTTLES DRAWN AEROBIC AND ANAEROBIC 5CC  Final   Culture NO GROWTH 5 DAYS  Final   Report Status 08/13/2014 FINAL  Final  Culture, blood (routine x 2)     Status: None   Collection Time: 08/08/14  9:15 PM  Result Value Ref Range Status   Specimen Description BLOOD RIGHT WRIST  Final   Special Requests BOTTLES DRAWN AEROBIC AND ANAEROBIC 5CC  Final   Culture NO GROWTH 5 DAYS  Final   Report Status 08/13/2014 FINAL  Final  MRSA PCR Screening     Status: None   Collection Time: 08/09/14  1:49 AM  Result Value Ref Range Status   MRSA by PCR NEGATIVE NEGATIVE Final    Comment:  The GeneXpert MRSA Assay (FDA approved for NASAL specimens only), is one component of a comprehensive MRSA colonization surveillance program. It is not intended to diagnose MRSA infection nor to guide or monitor treatment for MRSA infections.      Studies: No results found.  Scheduled Meds: . allopurinol  300 mg Oral Daily  . antiseptic oral rinse  7 mL Mouth Rinse q12n4p  . chlorhexidine gluconate  15 mL Mouth Rinse BID  . donepezil  23 mg Oral QHS  . furosemide  20 mg Intravenous Daily  . guaifenesin  600 mg Oral 4 times per day  . memantine  28 mg Oral Daily  . rosuvastatin  5 mg Oral Daily  . sodium chloride  3 mL Intravenous Q12H  . thiamine  100 mg Oral BID  . traZODone  400 mg Oral QHS    Continuous Infusions: . sodium chloride 250 mL (08/16/14 2036)    Principal Problem:   Acute respiratory failure with hypoxia Active Problems:   Atrial fibrillation   Acute on chronic systolic heart failure   Community acquired pneumonia   Biventricular implantable cardioverter-defibrillator in situ   Long term current use of anticoagulant therapy   Sepsis   Alcohol use   Dysphagia   Palliative care encounter   Cognitive impairment   History of mechanical aortic valve replacement    Time spent: 35 min    [facility] MD [facility] Pager [phone]. If 7PM-7AM, please contact night-coverage at [email or web address], password [facility] 08/17/2014, 10:47 AM  LOS: 9 days

## 2014-08-17 NOTE — Progress Notes (Signed)
ANTICOAGULATION CONSULT NOTE - Follow-Up Consult  Pharmacy Consult for Heparin  Indication: h/o afib, mechanical aortic valve  No Known Allergies  Patient Measurements: Height: 5\' 9"  (175.3 cm) Weight: 194 lb 10.7 oz (88.3 kg) IBW/kg (Calculated) : 70.7 Heparin Dosing Weight: 88 kg  Vital Signs: Temp: 98 F (36.7 C) (08/11 0852) Temp Source: Oral (08/11 0852) BP: 115/74 mmHg (08/11 0852) Pulse Rate: 69 (08/11 0852)  Labs:  Recent Labs  08/15/14 0250 08/16/14 0510 08/16/14 1131 08/16/14 1803 08/17/14 0707  HGB 12.4*  --   --   --  12.8*  HCT 38.1*  --   --   --  39.7  PLT 125*  --   --   --  125*  LABPROT 26.0* 26.8* 24.9* 20.6* 20.6*  INR 2.42* 2.51* 2.27* 1.78* 1.77*  HEPARINUNFRC  --   --   --   --  0.13*  CREATININE 0.87 0.86  --   --  0.86    Estimated Creatinine Clearance: 80.3 mL/min (by C-G formula based on Cr of 0.86).   Assessment: 79 yo M on Coumadin PTA for hx afib and mechanical aortic valve.  Coumadin on hold for EGD - pt on heparin bridge. To be restarted now prior to planned surgery tomorrow. To be turned off at 0700 on 8/12. No bleeding noted. INR 1.77.   Goal of Therapy:  INR 2-3 Heparin level 0.3-0.7 units/ml Monitor platelets by anticoagulation protocol: Yes   Plan:  -restart heparin drip at 1300 units/hr.  -TURN OFF HEPARIN GTT AT 0700 ON 8/12 -Check 8 hour heparin level at 2300 -f/u resumption of AC post EGD (hep + warf vs warf alone)  Ruthann Angulo D. Ferlando Lia, PharmD, BCPS Clinical Pharmacist Pager: [phone] 08/17/2014 3:03 PM

## 2014-08-17 NOTE — Progress Notes (Signed)
SUBJECTIVE: The patient is doing well today.  At this time, he denies chest pain, shortness of breath, or any new concerns. Some intolerance of full liquids, according to the family he is spitting up what he is trying to eat  CURRENT MEDICATIONS: . allopurinol  300 mg Oral Daily  . antiseptic oral rinse  7 mL Mouth Rinse q12n4p  . aspirin EC  81 mg Oral Daily  . chlorhexidine gluconate  15 mL Mouth Rinse BID  . donepezil  23 mg Oral QHS  . furosemide  20 mg Intravenous Daily  . guaifenesin  600 mg Oral 4 times per day  . memantine  28 mg Oral Daily  . rosuvastatin  5 mg Oral Daily  . sodium chloride  3 mL Intravenous Q12H  . thiamine  100 mg Oral BID  . traZODone  400 mg Oral QHS   . sodium chloride 250 mL (08/16/14 2036)  . heparin 1,300 Units/hr (08/17/14 0833)    OBJECTIVE: Physical Exam: Filed Vitals:   08/16/14 1706 08/16/14 2010 08/17/14 0345 08/17/14 0852  BP: 114/73 109/76 126/74 115/74  Pulse: 63 67 65 69  Temp: 98 F (36.7 C) 97.9 F (36.6 C) 98.2 F (36.8 C) 98 F (36.7 C)  TempSrc: Oral Oral Oral Oral  Resp: 16 18 17 18   Height:      Weight:  194 lb 10.7 oz (88.3 kg)    SpO2: 96% 96% 91% 95%    Intake/Output Summary (Last 24 hours) at 08/17/14 1032 Last data filed at 08/17/14 1023  Gross per 24 hour  Intake 1411.32 ml  Output    250 ml  Net 1161.32 ml    Telemetry reveals atrial fib/flutter with V pacing  GEN- The patient is well appearing  Head- normocephalic, atraumatic Eyes-  Sclera clear, conjunctiva pink Ears- hearing intact Oropharynx- clear Neck- supple  Lungs- Clear to ausculation bilaterally, normal work of breathing Heart- Irregular rate and rhythm GI- soft, NT, ND, + BS Extremities- no clubbing, cyanosis, or edema Skin- no rash or lesion Psych- euthymic mood, full affect Neuro- strength and sensation are intact  LABS: Basic Metabolic Panel:  Recent Labs  08/15/14 0250 08/16/14 0510 08/17/14 0707  NA 136 137 136  K  4.0 4.2 4.0  CL 94* 96* 94*  CO2 33* 33* 33*  GLUCOSE 98 85 91  BUN 13 15 14   CREATININE 0.87 0.86 0.86  CALCIUM 8.2* 8.5* 8.7*  MG 2.0  --   --    CBC:  Recent Labs  08/15/14 0250 08/17/14 0707  WBC 6.5 6.4  HGB 12.4* 12.8*  HCT 38.1* 39.7  MCV 95.0 95.9  PLT 125* 125*    RADIOLOGY: Dg Chest Port 1v Same Day 08/11/2014   CLINICAL DATA:  Congestive heart failure/cardiomyopathy  EXAM: PORTABLE CHEST - 1 VIEW SAME DAY  COMPARISON:  August 10, 2014  FINDINGS: There is patchy bibasilar atelectatic type change. There is trace interstitial edema, marginally less than on the study from 1 day prior. No new opacity. Heart is slightly enlarged with pulmonary vascularity within normal limits. Pacemaker leads are attached to the right atrium, right ventricle, and left ventricle. There is atherosclerotic change in aorta. No adenopathy. No pneumothorax.  IMPRESSION: Trace interstitial edema remains, slightly less than 1 day prior. There is patchy bibasilar atelectasis. No new opacity. Heart prominent but stable. Suspect mild residual congestive heart failure.   Electronically Signed   By: Lowella Grip III M.D.   On: 08/11/2014  07:44   ASSESSMENT AND PLAN:  Principal Problem:   Acute respiratory failure with hypoxia Active Problems:   Biventricular implantable cardioverter-defibrillator in situ   Atrial fibrillation   Acute on chronic systolic heart failure   Long term current use of anticoagulant therapy   Community acquired pneumonia   Sepsis   Alcohol use   Dysphagia   Palliative care encounter   Cognitive impairment   1. Atrial flutter Failed DCCV despite amiodarone Likelihood of maintaining SR at this point is low Will pursue rate  control long term Continue Warfarin long term for CHADS2VASC of at least 3 (on hold for now pending GI eval)  2. NICM/acute on chronic systolic heart failure Volume status stable Continue IV lasix for now until GI eval complete Tachy therapies on  ICD disabled Palliative care consult appreciated  3. Aspiration GI work up pending INR <2  Chanetta Marshall, NP 08/17/2014 10:32 AM    Agree euvolemic  Await assessment of swallowing abiity and keep on IV diuretics to avoid unncessary aspiration risk  i have spoken with GI this am to review the issue of whtheter he should be eating as according to the family he is regurgitating; also does he need cough meds  Will stop ASA  Did not get procedure today as his heparin was not stopped ; have spoken with hospitalist team re resumption of coumadin post procedure

## 2014-08-17 NOTE — Progress Notes (Signed)
The patient was scheduled to have an EGD with Botox injection at 8 AM this morning. Unfortunately it had to be canceled because he was on a heparin drip. We did not know this in gastroenterology or endoscopy until we went to get the patient. It appears that he was started on a heparin drip last night but it was not discontinued in anticipation of this morning's endoscopy. Because of scheduling arrangements for the rest of the day in endoscopy in coordination with anesthesia we could not reschedule the procedure for today. Apparently the family insisted that his heparin drip be discontinued today thinking that the procedure might get scheduled for today but it could not. In any event I saw the patient discussed it with him and his son. The heparin is going to be restarted today. His procedure with EGD and Botox will be planned for tomorrow. I also told them that in the event that he had a definite esophageal stricture dilation of the esophagus might be in order as well. They understand. We will resume heparin now. We will stop it at 7 AM in anticipation of a 1 PM procedure tomorrow with EGD, Botox, possible dilation.

## 2014-08-17 NOTE — Care Management Note (Addendum)
Case Management Note  Patient Details  Name: Jon Butler MRN: [id number] Date of Birth: 1935-10-28  Subjective/Objective:       CHF, Afib, CAP             Action/Plan: Home Hospice  Expected Discharge Date:                  Expected Discharge Plan:  [place]  In-House Referral:  Hospice / Palliative Care  Discharge planning Services  CM Consult  Post Acute Care Choice:  Hospice Choice offered to:  Adult Children  DME Arranged:    DME Agency:     HH Arranged:    HH Agency:     Status of Service:  In process, will continue to follow  Medicare Important Message Given:  Yes-fourth notification given Date Medicare IM Given:    Medicare IM give by:    Date Additional Medicare IM Given:    Additional Medicare Important Message give by:     If discussed at [place] of Stay Meetings, dates discussed:    Additional Comments: 08/18/2014 1330 NCM spoke to [facility] and requested [place]. Referral faxed to [place]. Son request that [facility] speak to him prior to speak to the pt. They do not want pt to know Hospice is coming into the home at this time to help reduce anxiety. Spoke to Hospice referral line and they arrange for Hospital Liaison to speak with son. [name] RN CCM Case Mgmt phone [phone]  NCM spoke to son, Jon Butler, [phone]. He does have list of Hospice Providers. States he wants to speak to Hospice Liaison about Hospice services and what is offered at home. He and pt's wife will review list and give NCM info on 08/18/2014. NCM will send referral to Hospice Liaison once choice received.  Erenest Rasher, RN 08/17/2014, 3:43 PM

## 2014-08-18 ENCOUNTER — Encounter (HOSPITAL_COMMUNITY)
Admission: EM | Disposition: A | Discharge status: Home Health | Payer: Self-pay | Source: Home / Self Care | Attending: Internal Medicine

## 2014-08-18 ENCOUNTER — Inpatient Hospital Stay (HOSPITAL_COMMUNITY): Payer: Medicare Other | Admitting: Anesthesiology

## 2014-08-18 ENCOUNTER — Encounter (HOSPITAL_COMMUNITY): Payer: Self-pay | Admitting: *Deleted

## 2014-08-18 HISTORY — PX: ESOPHAGOGASTRODUODENOSCOPY (EGD) WITH PROPOFOL: SHX5813

## 2014-08-18 HISTORY — PX: BOTOX INJECTION: SHX5754

## 2014-08-18 LAB — CBC
HEMATOCRIT: 38.4 % — AB (ref 39.0–52.0)
HEMOGLOBIN: 12.5 g/dL — AB (ref 13.0–17.0)
MCH: 30.8 pg (ref 26.0–34.0)
MCHC: 32.6 g/dL (ref 30.0–36.0)
MCV: 94.6 fL (ref 78.0–100.0)
Platelets: 124 10*3/uL — ABNORMAL LOW (ref 150–400)
RBC: 4.06 MIL/uL — ABNORMAL LOW (ref 4.22–5.81)
RDW: 16.9 % — ABNORMAL HIGH (ref 11.5–15.5)
WBC: 5.1 10*3/uL (ref 4.0–10.5)

## 2014-08-18 LAB — BASIC METABOLIC PANEL
Anion gap: 8 (ref 5–15)
BUN: 12 mg/dL (ref 6–20)
CO2: 34 mmol/L — ABNORMAL HIGH (ref 22–32)
Calcium: 8.6 mg/dL — ABNORMAL LOW (ref 8.9–10.3)
Chloride: 95 mmol/L — ABNORMAL LOW (ref 101–111)
Creatinine, Ser: 0.93 mg/dL (ref 0.61–1.24)
GFR calc non Af Amer: >60 mL/min (ref >60)
GLUCOSE: 83 mg/dL (ref 65–99)
POTASSIUM: 3.9 mmol/L (ref 3.5–5.1)
SODIUM: 137 mmol/L (ref 135–145)

## 2014-08-18 LAB — PROTIME-INR
INR: 1.57 — AB (ref 0.00–1.49)
Prothrombin Time: 18.8 seconds — ABNORMAL HIGH (ref 11.6–15.2)

## 2014-08-18 LAB — HEPARIN LEVEL (UNFRACTIONATED): Heparin Unfractionated: 0.32 IU/mL (ref 0.30–0.70)

## 2014-08-18 SURGERY — ESOPHAGOGASTRODUODENOSCOPY (EGD) WITH PROPOFOL
Anesthesia: Monitor Anesthesia Care

## 2014-08-18 MED ORDER — LACTATED RINGERS IV SOLN
INTRAVENOUS | Status: DC
  Administered 2014-08-18: 1000 mL via INTRAVENOUS

## 2014-08-18 MED ORDER — PROPOFOL 10 MG/ML IV BOLUS
INTRAVENOUS | Status: DC | PRN
  Administered 2014-08-18 (×2): 30 mg via INTRAVENOUS

## 2014-08-18 MED ORDER — SODIUM CHLORIDE 0.9 % IJ SOLN
100.0000 [IU] | Freq: Once | INTRAMUSCULAR | Status: DC
  Filled 2014-08-18: qty 100

## 2014-08-18 MED ORDER — PROPOFOL INFUSION 10 MG/ML OPTIME
INTRAVENOUS | Status: DC | PRN
  Administered 2014-08-18: 50 ug/kg/min via INTRAVENOUS

## 2014-08-18 MED ORDER — WARFARIN SODIUM 4 MG PO TABS
4.0000 mg | ORAL_TABLET | Freq: Once | ORAL | Status: AC
  Administered 2014-08-18: 4 mg via ORAL
  Filled 2014-08-18: qty 1

## 2014-08-18 MED ORDER — WARFARIN - PHARMACIST DOSING INPATIENT: Freq: Every day | Status: DC

## 2014-08-18 MED ORDER — HEPARIN (PORCINE) IN NACL 100-0.45 UNIT/ML-% IJ SOLN
1350.0000 [IU]/h | INTRAMUSCULAR | Status: DC
  Administered 2014-08-18: 1350 [IU]/h via INTRAVENOUS
  Filled 2014-08-18: qty 250

## 2014-08-18 MED ORDER — SODIUM CHLORIDE 0.9 % IJ SOLN
INTRAMUSCULAR | Status: DC | PRN
  Administered 2014-08-18: 25 mL via SUBMUCOSAL

## 2014-08-18 MED ORDER — BUTAMBEN-TETRACAINE-BENZOCAINE 2-2-14 % EX AERO
INHALATION_SPRAY | CUTANEOUS | Status: DC | PRN
  Administered 2014-08-18: 2 via TOPICAL

## 2014-08-18 MED ORDER — SODIUM CHLORIDE 0.9 % IV SOLN
INTRAVENOUS | Status: DC
Start: 2014-08-18 — End: 2014-08-18

## 2014-08-18 NOTE — Transfer of Care (Signed)
Immediate Anesthesia Transfer of Care Note  Patient: Jon Butler  Procedure(s) Performed: Procedure(s): ESOPHAGOGASTRODUODENOSCOPY (EGD) WITH PROPOFOL (N/A) BOTOX INJECTION (N/A)  Patient Location: Endoscopy Unit  Anesthesia Type:MAC  Level of Consciousness: awake  Airway & Oxygen Therapy: Patient Spontanous Breathing and Patient connected to nasal cannula oxygen  Post-op Assessment: Report given to RN, Post -op Vital signs reviewed and stable and Patient moving all extremities  Post vital signs: Reviewed and stable  Last Vitals:  Filed Vitals:   08/18/14 1244  BP: 132/87  Pulse:   Temp: 36.6 C  Resp: 18    Complications: No apparent anesthesia complications

## 2014-08-18 NOTE — Progress Notes (Signed)
ANTICOAGULATION CONSULT NOTE - Follow-Up Consult  Pharmacy Consult for Heparin  Indication: h/o afib, mechanical aortic valve  No Known Allergies  Patient Measurements: Height: 5\' 9"  (175.3 cm) Weight: 194 lb 10.7 oz (88.3 kg) IBW/kg (Calculated) : 70.7 Heparin Dosing Weight: 88 kg  Vital Signs: Temp: 98.2 F (36.8 C) (08/11 2030) Temp Source: Oral (08/11 2030) BP: 110/73 mmHg (08/11 2030) Pulse Rate: 66 (08/11 2030)  Labs:  Recent Labs  08/15/14 0250 08/16/14 0510 08/16/14 1131 08/16/14 1803 08/17/14 0707 08/17/14 1650 08/17/14 2332  HGB 12.4*  --   --   --  12.8*  --   --   HCT 38.1*  --   --   --  39.7  --   --   PLT 125*  --   --   --  125*  --   --   LABPROT 26.0* 26.8* 24.9* 20.6* 20.6*  --   --   INR 2.42* 2.51* 2.27* 1.78* 1.77*  --   --   HEPARINUNFRC  --   --   --   --  0.13* <0.10* 0.32  CREATININE 0.87 0.86  --   --  0.86  --   --     Estimated Creatinine Clearance: 80.3 mL/min (by C-G formula based on Cr of 0.86).   Assessment: 79 yo M on Coumadin PTA for hx afib and mechanical aortic valve.  Coumadin on hold for EGD - pt on heparin bridge at 1300 units/hr. Planned surgery tomorrow. To be turned off at 0700 on 8/12. HL is now therapeutic at 0.32 but on lower end of goal range  No bleeding noted. INR 1.77.   Goal of Therapy:  INR 2-3 Heparin level 0.3-0.7 units/ml Monitor platelets by anticoagulation protocol: Yes   Plan:  -Increase heparin infusion to 1350 units/hr -TURN OFF HEPARIN GTT AT 0700 ON 8/12 -f/u resumption of AC post EGD (hep + warf vs warf alone)  Albertina Parr, PharmD., BCPS Clinical Pharmacist Pager [phone]

## 2014-08-18 NOTE — H&P (Signed)
The patient presents to the endoscopy department for EGD with Botox injection of distal esophagus for suspected achalasia. I told the patient also that if there appears to be a fixed firm stricture there however we might need to do esophageal dilatation. He understands and consents to that. He understands the risks of bleeding infection perforation.  Physical:  He doesn't appear in any acute distress  Heart irregular., systolic murmur  Lungs clear  Abdomen soft nontender  Impression: Probable achalasia rule out esophageal stricture versus other abnormalities  Plan: EGD with Botox injection of lower esophageal sphincter area. Possible dilatation if fixed stricture is seen.

## 2014-08-18 NOTE — Progress Notes (Signed)
ANTICOAGULATION CONSULT NOTE - Follow-Up Consult  Pharmacy Consult for Heparin + warfarin  Indication: h/o afib, mechanical aortic valve  No Known Allergies  Patient Measurements: Height: 5\' 9"  (175.3 cm) Weight: 194 lb 10.7 oz (88.3 kg) IBW/kg (Calculated) : 70.7 Heparin Dosing Weight: 88 kg  Vital Signs: Temp: 97.5 F (36.4 C) (08/12 1352) Temp Source: Oral (08/12 1352) BP: 128/83 mmHg (08/12 1410) Pulse Rate: 59 (08/12 1410)  Labs:  Recent Labs  08/16/14 0510  08/16/14 1803 08/17/14 0707 08/17/14 1650 08/17/14 2332 08/18/14 0405  HGB  --   --   --  12.8*  --   --  12.5*  HCT  --   --   --  39.7  --   --  38.4*  PLT  --   --   --  125*  --   --  124*  LABPROT 26.8*  < > 20.6* 20.6*  --   --  18.8*  INR 2.51*  < > 1.78* 1.77*  --   --  1.57*  HEPARINUNFRC  --   --   --  0.13* <0.10* 0.32  --   CREATININE 0.86  --   --  0.86  --   --  0.93  < > = values in this interval not displayed.  Estimated Creatinine Clearance: 74.3 mL/min (by C-G formula based on Cr of 0.93).   Assessment: 79 yo M on Coumadin PTA for hx afib and mechanical aortic valve.  Coumadin was held for EGD. He is now s/p procedure and I spoke with Dr. Penelope Coop from GI about restarting anticoagulation. Dr. Biagio Borg note from this morning indicates cardiology had stated he would not need heparin post-procedure. Due to mechanical valve, GI would like to restart heparin bridge with warfarin and defer changes to primary/cardiology.  Was previously running on heparin at 1350 units/hr.  Home warfarin dose is 2mg  daily except 3mg  on Tuesdays and Saturdays.  INR this morning 1.57. He did receive FFP to reverse INR for procedure,   Goal of Therapy:  INR 2-3 Heparin level 0.3-0.7 units/ml Monitor platelets by anticoagulation protocol: Yes   Plan:  -as per discussion with Dr. Penelope Coop, restart heparin at 1600 tonight at rate of 1350 units/h -heparin level at midnight (8h after restart) -warfarin 4mg  po x1  tonight -daily HL, CBC and INR  Marten Iles D. Shanaia Sievers, PharmD, BCPS Clinical Pharmacist Pager: [phone] 08/18/2014 2:18 PM

## 2014-08-18 NOTE — Progress Notes (Signed)
Patient scheduled to receive multiple PO meds that are unable to be changed to a liquid form. Called Dr. Penelope Coop who stated it was ok to give meds crushed in applesauce (as they were previously being administered) despite him being on a clear liquid only diet.   Joellen Jersey, RN.

## 2014-08-18 NOTE — Progress Notes (Signed)
[facility] PROGRESS NOTE  Jon Butler HUD:[id number] DOB: Jan 19, 1935 DOA: 08/08/2014 PCP: [name], MD  Assessment/Plan: 1. Acute hypoxic respiratory failure- multifactorial due to community acquired pneumonia as well as systolic CHF. Improved clinically. Patient currently 95% on RA. Continue IV Lasix. Patient is status post 7 days of IV antibiotics and has appropriately been treated. Follow. 2. Acute on chronic systolic CHF- patient has nonischemic cardiomyopathy with EF 20%, followed by cardiology.  Patient was on full liquids with aspiration precautions, with high aspiration risk until INR became subtherapeutic for GI procedure. Continue IV Lasix 20 mg daily. Patient has been seen in consultation by palliative care and patient currently a DO NOT RESUSCITATE. Patient wanted to go through GI procedure for esophageal stricture and possibly home with hospice. Per cardiology. 3. Persistent atrial fibrillation- status post failed DCCV despite amiodarone on 08/10/2014. Patient remained in A. fib with rate controlled. Amiodarone has been stopped per cardiology. Rate seems to be controlled. Per cardiology. 4. Community-acquired pneumonia--- clinical improvement. Patient with improved oxygenation requirements. Patient is status post 7 days of IV antibiotics. No further antibiotics needed at this time. 5. Dysphagia- patient underwent Esophogram, which showed distal esophageal obstruction versus spasm. GI has seen the patient and discussed options with the patient's family. Patient was also seen by palliative care, from their note seems as if patient and family want to go through procedure for esophageal stricture at this time and as such awaiting GI procedure once INR is subtherapeutic. Patient s/p 2 units FFP in order to reverse INR for GI procedure today. INR currently at 1.57. IV heparin was stopped temporary yesterday and resumed yesterday afternoon when it was noted that GI procedure was unable to  be done yesterday. Heparin has been discontinued as of 7 AM this morning in anticipation of GI procedure early this afternoon. Per GI.  6. History of mechanical aortic valve- patient currently off warfarin. Patient's INR was reversed with 2 units of FFP and IV heparin started overnight as INR was less than 2. IV heparin was initially discontinued now at family's insistence while awaiting to see if GI procedure can be done later yesterday. IV heparin was resumed yesterday after GI procedure could not be done and stopped this morning at 7 AM in anticipation of GI procedure early this afternoon. Postprocedure GI to advise when patient can be resumed on his Coumadin anticoagulation. Postprocedure heparin bridge is not needed per cardiology.  7. History of alcohol abuse- continue thiamine 8. Hypokalemia- Repleted. Keep magnesium greater than 2. 9. Acute on chronic thrombocytopenia- stable. Follow.  10. DVT prophylaxis- SCDs preprocedure as INR is now soft therapeutic. Coumadin on hold. IV heparin discontinued in anticipation of possible procedure.   Code Status: Full code Family Communication: Updated patient and wife at bedside. Disposition Plan: Home with hospice once medically stable, and when okay with cardiology and after GI procedure.   Consultants:  Cardiology: Dr Haroldine Laws 08/09/2014  Gastroenterology: Dr. Cristina Gong 08/11/2014   Palliative care: Imelda Pillow, NP 08/13/2014  Procedures:  Esophogram 08/11/2014  Chest x-ray 08/08/2014, 08/09/2014, 08/11/2014  TEE with cardioversion (unsuccessful) 08/10/2014 Dr Croitoru  2 units FFP 08/16/2014  Antibiotics:  IV azithromycin 08/08/2014>>>>>> 08/15/2014  IV Rocephin 08/08/2014>>>>>>> 08/15/2014  HPI/Subjective: 79 y.o. male with Past medical history of left bundle-branch block, chronic systolic CHF, LVEF 85%, aortic replacement with mechanical valve, chronic anticoagulation, atrial fibrillation, alcohol use., presented with  complaints of shortness of breath. Wife reported the patient has been becoming more tired fatigue and lethargic  and also has developed cough with sputum production, also had some shaking chills.Admitted with CAP and CHF. Esophogram  Showed  possible distal esophageal mass versus spasm. Gastroenterology was consulted, and patient seen by Dr. Cristina Gong. Currently awaiting INR to be subtherapeutic prior to upper endoscopy with intervention per GI. Patient is currently nothing by mouth. Cardiology following.     Patient with no complaints. No chest. No shortness of breath. Patient asking when procedures is going to take place and how long it will take, and recovery time.   Objective: Filed Vitals:   08/18/14 0441  BP: 113/61  Pulse: 63  Temp: 98.6 F (37 C)  Resp: 18    Intake/Output Summary (Last 24 hours) at 08/18/14 1006 Last data filed at 08/18/14 8841  Gross per 24 hour  Intake 1359.36 ml  Output    201 ml  Net 1158.36 ml   Filed Weights   08/09/14 0036 08/16/14 0551 08/16/14 2010  Weight: 89.7 kg (197 lb 12 oz) 84.7 kg (186 lb 11.7 oz) 88.3 kg (194 lb 10.7 oz)    Exam:   General:  Appears in no acute distress    Cardiovascular: Irregularly irregular. Crisp click. With  Respiratory: Clear to auscultation bilaterally  Abdomen: Soft, nontender, no organomegaly  Musculoskeletal: Trace to 1 + Bilateral  edema of the lower extremities  Data Reviewed: Basic Metabolic Panel:  Recent Labs Lab 08/14/14 0224 08/15/14 0250 08/16/14 0510 08/17/14 0707 08/18/14 0405  NA 139 136 137 136 137  K 3.2* 4.0 4.2 4.0 3.9  CL 94* 94* 96* 94* 95*  CO2 36* 33* 33* 33* 34*  GLUCOSE 87 98 85 91 83  BUN 13 13 15 14 12   CREATININE 0.84 0.87 0.86 0.86 0.93  CALCIUM 8.2* 8.2* 8.5* 8.7* 8.6*  MG 1.7 2.0  --   --   --    Liver Function Tests: No results for input(s): AST, ALT, ALKPHOS, BILITOT, PROT, ALBUMIN in the last 168 hours. No results for input(s): LIPASE, AMYLASE in the  last 168 hours. No results for input(s): AMMONIA in the last 168 hours. CBC:  Recent Labs Lab 08/12/14 0244 08/14/14 0224 08/15/14 0250 08/17/14 0707 08/18/14 0405  WBC 6.3 6.2 6.5 6.4 5.1  HGB 12.6* 12.4* 12.4* 12.8* 12.5*  HCT 38.9* 38.2* 38.1* 39.7 38.4*  MCV 95.6 95.0 95.0 95.9 94.6  PLT 107* 120* 125* 125* 124*   Cardiac Enzymes: No results for input(s): CKTOTAL, CKMB, CKMBINDEX, TROPONINI in the last 168 hours. BNP (last 3 results)  Recent Labs  08/09/14 0745  BNP 2479.2*    ProBNP (last 3 results) No results for input(s): PROBNP in the last 8760 hours.  CBG: No results for input(s): GLUCAP in the last 168 hours.  Recent Results (from the past 240 hour(s))  Culture, blood (routine x 2)     Status: None   Collection Time: 08/08/14  9:10 PM  Result Value Ref Range Status   Specimen Description BLOOD LEFT ARM  Final   Special Requests BOTTLES DRAWN AEROBIC AND ANAEROBIC 5CC  Final   Culture NO GROWTH 5 DAYS  Final   Report Status 08/13/2014 FINAL  Final  Culture, blood (routine x 2)     Status: None   Collection Time: 08/08/14  9:15 PM  Result Value Ref Range Status   Specimen Description BLOOD RIGHT WRIST  Final   Special Requests BOTTLES DRAWN AEROBIC AND ANAEROBIC 5CC  Final   Culture NO GROWTH 5 DAYS  Final  Report Status 08/13/2014 FINAL  Final  MRSA PCR Screening     Status: None   Collection Time: 08/09/14  1:49 AM  Result Value Ref Range Status   MRSA by PCR NEGATIVE NEGATIVE Final    Comment:        The GeneXpert MRSA Assay (FDA approved for NASAL specimens only), is one component of a comprehensive MRSA colonization surveillance program. It is not intended to diagnose MRSA infection nor to guide or monitor treatment for MRSA infections.      Studies: No results found.  Scheduled Meds: . allopurinol  300 mg Oral Daily  . antiseptic oral rinse  7 mL Mouth Rinse q12n4p  . chlorhexidine gluconate  15 mL Mouth Rinse BID  . donepezil  23  mg Oral QHS  . furosemide  20 mg Intravenous Daily  . guaifenesin  600 mg Oral 4 times per day  . memantine  28 mg Oral Daily  . rosuvastatin  5 mg Oral Daily  . sodium chloride  3 mL Intravenous Q12H  . thiamine  100 mg Oral BID  . traZODone  400 mg Oral QHS   Continuous Infusions: . sodium chloride 250 mL (08/16/14 2036)    Principal Problem:   Acute respiratory failure with hypoxia Active Problems:   Atrial fibrillation   Acute on chronic systolic heart failure   Community acquired pneumonia   Biventricular implantable cardioverter-defibrillator in situ   Long term current use of anticoagulant therapy   Sepsis   Alcohol use   Dysphagia   Palliative care encounter   Cognitive impairment   History of mechanical aortic valve replacement    Time spent: 35 min    [facility] MD [facility] Pager [phone]. If 7PM-7AM, please contact night-coverage at [email or web address], password [facility] 08/18/2014, 10:06 AM  LOS: 10 days

## 2014-08-18 NOTE — Op Note (Signed)
[facility] [place], [address]   ENDOSCOPY PROCEDURE REPORT  PATIENT: Jon, Butler  MR#: [id number] BIRTHDATE: 11/20/1935 , 78  yrs. old GENDER: male ENDOSCOPIST: Acquanetta Sit, MD REFERRED BY: PROCEDURE DATE:  09/06/14 PROCEDURE:  EGD with toxin injection ASA CLASS:     3 INDICATIONS:  dysphagia, achalasia MEDICATIONS: propofol per anesthesia TOPICAL ANESTHETIC: Cetacaine spray to the oropharynx  DESCRIPTION OF PROCEDURE: After the risks benefits and alternatives of the procedure were thoroughly explained, informed consent was obtained.  The Pentax      endoscope was introduced through the mouth and advanced to the second portion of the duodenum , Without limitations.  The instrument was slowly withdrawn as the mucosa was fully examined. Estimated blood loss is zero unless otherwise noted in this procedure report.  Findings:  Esophagus: The proximal and mid esophagus were normal, and the mucosa of the distal esophagus was normal. Upon reaching the level of the distal esophagus at the level of the lower esophageal sphincter there was spasm which with gentle pressure and air insufflation allowed passage of the scope into the stomach with somewhat of a pop feel as one  feels with achalasia. There was no evidence of inflammation, tumor, or ulcer in this region, and no evidence of a fixed stricture.  Stomach: Normal including retroflexion of the fundus and cardia.  Duodenum: Normal  After the inspection was completed of the upper GI tract the scope was brought back into the esophagus. Where the narrowing from spasm was occurring which was just above the region of the esophagogastric junction and consistent with achalasia Botox was injected. A 100 unit vile of Botox was drawn up with a total of 4 mL total. 1 mL was injected into each quadrant at the level of the lower esophageal sphincter. No bleeding occurred. I could see  some weals from the injections.      The scope was then withdrawn from the patient and the procedure completed.  COMPLICATIONS: There were no immediate complications.  ENDOSCOPIC IMPRESSION: endoscopic findings compatible with achalasia. Status post Botox injection.   RECOMMENDATIONS: observe for response to Botox injection. We can resume heparin in a couple of hours. We will allow clear liquids today and advance diet tomorrow as tolerated.   REPEAT EXAM:  eSignedAcquanetta Sit, MD 09/06/2014 1:54 PM    CC:  CPT CODES: ICD CODES:  The ICD and CPT codes recommended by this software are interpretations from the data that the clinical staff has captured with the software.  The verification of the translation of this report to the ICD and CPT codes and modifiers is the sole responsibility of the health care institution and practicing physician where this report was generated.  [employer] will not be held responsible for the validity of the ICD and CPT codes included on this report.  AMA assumes no liability for data contained or not contained herein. CPT is a Designer, television/film set of the [employer].  PATIENT NAME:  Jon, Butler MR#: [id number]

## 2014-08-18 NOTE — Anesthesia Procedure Notes (Signed)
Procedure Name: MAC Date/Time: 08/18/2014 1:20 PM Performed by: [place] Pink Pre-anesthesia Checklist: Patient identified, Emergency Drugs available, Suction available, Patient being monitored and Timeout performed Patient Re-evaluated:Patient Re-evaluated prior to inductionOxygen Delivery Method: Nasal cannula Placement Confirmation: positive ETCO2 Dental Injury: Teeth and Oropharynx as per pre-operative assessment

## 2014-08-18 NOTE — Progress Notes (Signed)
Hospital and Palliative Care Liaison called and talked with pt's son at length after [facility] referral received. Jon Butler had multiple questions about the services hospice provides. He reports being interested if his father is eligible and states he really needs physical help in the home for several hours a day. Liaison advised that the hospice team supplements the care that is in the home. He states he will have to look at an agency to come in and help with his father. Jon Butler states he still has a lot of questions to ask some of the doctors and also states he is hoping his father will remain in the hospital for 3-4 more days. Jon Butler advised that the [facility] MD will review his medcial information for eligibility and this liaison will call him  tomorrow. Contact information provided.   [facility] Liaison [phone]

## 2014-08-19 DIAGNOSIS — K220 Achalasia of cardia: Secondary | ICD-10-CM | POA: Diagnosis present

## 2014-08-19 LAB — PROTIME-INR
INR: 1.57 — AB (ref 0.00–1.49)
Prothrombin Time: 18.8 seconds — ABNORMAL HIGH (ref 11.6–15.2)

## 2014-08-19 LAB — BASIC METABOLIC PANEL
ANION GAP: 9 (ref 5–15)
BUN: 11 mg/dL (ref 6–20)
CALCIUM: 8.4 mg/dL — AB (ref 8.9–10.3)
CO2: 29 mmol/L (ref 22–32)
CREATININE: 0.85 mg/dL (ref 0.61–1.24)
Chloride: 97 mmol/L — ABNORMAL LOW (ref 101–111)
GFR calc Af Amer: >60 mL/min (ref >60)
GFR calc non Af Amer: >60 mL/min (ref >60)
GLUCOSE: 108 mg/dL — AB (ref 65–99)
Potassium: 3.7 mmol/L (ref 3.5–5.1)
Sodium: 135 mmol/L (ref 135–145)

## 2014-08-19 LAB — CBC
HCT: 37.2 % — ABNORMAL LOW (ref 39.0–52.0)
HEMOGLOBIN: 12.2 g/dL — AB (ref 13.0–17.0)
MCH: 31 pg (ref 26.0–34.0)
MCHC: 32.8 g/dL (ref 30.0–36.0)
MCV: 94.7 fL (ref 78.0–100.0)
PLATELETS: 115 10*3/uL — AB (ref 150–400)
RBC: 3.93 MIL/uL — ABNORMAL LOW (ref 4.22–5.81)
RDW: 16.8 % — AB (ref 11.5–15.5)
WBC: 5.4 10*3/uL (ref 4.0–10.5)

## 2014-08-19 LAB — HEPARIN LEVEL (UNFRACTIONATED): HEPARIN UNFRACTIONATED: 0.36 [IU]/mL (ref 0.30–0.70)

## 2014-08-19 MED ORDER — WARFARIN SODIUM 4 MG PO TABS
4.0000 mg | ORAL_TABLET | Freq: Once | ORAL | Status: AC
  Administered 2014-08-19: 4 mg via ORAL
  Filled 2014-08-19: qty 1

## 2014-08-19 MED ORDER — FUROSEMIDE 40 MG PO TABS
40.0000 mg | ORAL_TABLET | Freq: Every day | ORAL | Status: DC
  Administered 2014-08-19 – 2014-08-21 (×3): 40 mg via ORAL
  Filled 2014-08-19 (×3): qty 1

## 2014-08-19 NOTE — Progress Notes (Signed)
ANTICOAGULATION CONSULT NOTE - Follow Up Consult  Pharmacy Consult for coumadin Indication: atrial fibrillation and hx of mech AVR  No Known Allergies  Patient Measurements: Height: 5\' 9"  (175.3 cm) Weight: 202 lb 8.7 oz (91.873 kg) IBW/kg (Calculated) : 70.7 Heparin Dosing Weight:   Vital Signs: Temp: 97.7 F (36.5 C) (08/13 0833) Temp Source: Oral (08/13 0833) BP: 93/63 mmHg (08/13 0833) Pulse Rate: 59 (08/13 0833)  Labs:  Recent Labs  08/17/14 0707 08/17/14 1650 08/17/14 2332 08/18/14 0405 08/19/14 0029  HGB 12.8*  --   --  12.5* 12.2*  HCT 39.7  --   --  38.4* 37.2*  PLT 125*  --   --  124* 115*  LABPROT 20.6*  --   --  18.8* 18.8*  INR 1.77*  --   --  1.57* 1.57*  HEPARINUNFRC 0.13* <0.10* 0.32  --  0.36  CREATININE 0.86  --   --  0.93 0.85    Estimated Creatinine Clearance: 82.8 mL/min (by C-G formula based on Cr of 0.85).   Medications:  Scheduled:  . allopurinol  300 mg Oral Daily  . antiseptic oral rinse  7 mL Mouth Rinse q12n4p  . chlorhexidine gluconate  15 mL Mouth Rinse BID  . donepezil  23 mg Oral QHS  . furosemide  40 mg Oral Daily  . guaifenesin  600 mg Oral 4 times per day  . memantine  28 mg Oral Daily  . rosuvastatin  5 mg Oral Daily  . sodium chloride  3 mL Intravenous Q12H  . thiamine  100 mg Oral BID  . traZODone  400 mg Oral QHS  . Warfarin - Pharmacist Dosing Inpatient   Does not apply q1800   Infusions:  . sodium chloride 250 mL (08/16/14 2036)    Assessment: 79 yo male with hx of mechanical AVR and afib is currently on subtherapeutic heparin.  MD d/c'ed heparin and want coumadin only.  INR today is 1.57.  Home dose was 3 mg on TuSa and 2 mg all other days.  Goal of Therapy:  INR 2-3 Monitor platelets by anticoagulation protocol: Yes   Plan:  - coumadin 4 mg po x1 - INR in am  Dayani Winbush, Tsz-Yin 08/19/2014,12:00 PM

## 2014-08-19 NOTE — Progress Notes (Signed)
Patient ID: Jon Butler, male   DOB: March 10, 1935, 79 y.o.   MRN: [id number] [facility] Gastroenterology Progress Note  Jon Butler 79 y.o. 11-Apr-1935   Subjective: Sitting up in bed. Tolerating clear liquids. No complaints. No family in room.  Objective: Vital signs in last 24 hours: Filed Vitals:   08/19/14 0833  BP: 93/63  Pulse: 59  Temp: 97.7 F (36.5 C)  Resp: 18    Physical Exam: Gen: alert, no acute distress   Lab Results:  Recent Labs  08/18/14 0405 08/19/14 0029  NA 137 135  K 3.9 3.7  CL 95* 97*  CO2 34* 29  GLUCOSE 83 108*  BUN 12 11  CREATININE 0.93 0.85  CALCIUM 8.6* 8.4*   No results for input(s): AST, ALT, ALKPHOS, BILITOT, PROT, ALBUMIN in the last 72 hours.  Recent Labs  08/18/14 0405 08/19/14 0029  WBC 5.1 5.4  HGB 12.5* 12.2*  HCT 38.4* 37.2*  MCV 94.6 94.7  PLT 124* 115*    Recent Labs  08/18/14 0405 08/19/14 0029  LABPROT 18.8* 18.8*  INR 1.57* 1.57*      Assessment/Plan: S/P Botox injection for achalasia yesterday. Tolerating clear liquids. Will advance diet as tolerated. Will sign off. Call if questions.   [place] C. 08/19/2014, 9:21 AM  Pager [phone]  If no answer or after 5 PM call [phone]

## 2014-08-19 NOTE — Progress Notes (Signed)
    SUBJECTIVE:  Taking liquids this morning.  No SOB.  No pain   PHYSICAL EXAM Filed Vitals:   08/18/14 1746 08/18/14 2049 08/19/14 0537 08/19/14 0833  BP: 128/88 114/69 114/67 93/63  Pulse: 75 65 60 59  Temp: 98.2 F (36.8 C) 97.9 F (36.6 C) 97.8 F (36.6 C) 97.7 F (36.5 C)  TempSrc: Oral Oral Oral Oral  Resp: 18 16 16 18   Height:      Weight:  202 lb 8.7 oz (91.873 kg)    SpO2: 100% 100% 97% 97%   General:  No distress Lungs:  Decreased breath sounds Heart:  Irregular Abdomen:  Positive bowel sounds, no rebound no guarding Extremities:  No edema   LABS:  Results for orders placed or performed during the hospital encounter of 08/08/14 (from the past 24 hour(s))  Heparin level (unfractionated)     Status: None   Collection Time: 08/19/14 12:29 AM  Result Value Ref Range   Heparin Unfractionated 0.36 0.30 - 0.70 IU/mL  Basic metabolic panel     Status: Abnormal   Collection Time: 08/19/14 12:29 AM  Result Value Ref Range   Sodium 135 135 - 145 mmol/L   Potassium 3.7 3.5 - 5.1 mmol/L   Chloride 97 (L) 101 - 111 mmol/L   CO2 29 22 - 32 mmol/L   Glucose, Bld 108 (H) 65 - 99 mg/dL   BUN 11 6 - 20 mg/dL   Creatinine, Ser 0.85 0.61 - 1.24 mg/dL   Calcium 8.4 (L) 8.9 - 10.3 mg/dL   GFR calc non Af Amer >60 >60 mL/min   GFR calc Af Amer >60 >60 mL/min   Anion gap 9 5 - 15  CBC     Status: Abnormal   Collection Time: 08/19/14 12:29 AM  Result Value Ref Range   WBC 5.4 4.0 - 10.5 K/uL   RBC 3.93 (L) 4.22 - 5.81 MIL/uL   Hemoglobin 12.2 (L) 13.0 - 17.0 g/dL   HCT 37.2 (L) 39.0 - 52.0 %   MCV 94.7 78.0 - 100.0 fL   MCH 31.0 26.0 - 34.0 pg   MCHC 32.8 30.0 - 36.0 g/dL   RDW 16.8 (H) 11.5 - 15.5 %   Platelets 115 (L) 150 - 400 K/uL  Protime-INR     Status: Abnormal   Collection Time: 08/19/14 12:29 AM  Result Value Ref Range   Prothrombin Time 18.8 (H) 11.6 - 15.2 seconds   INR 1.57 (H) 0.00 - 1.49    Intake/Output Summary (Last 24 hours) at 08/19/14 4782 Last  data filed at 08/18/14 1747  Gross per 24 hour  Intake    960 ml  Output      0 ml  Net    960 ml     ASSESSMENT AND PLAN:  ATRIAL FLUTTER:  Plan rate control and warfarin.   Warfarin was resumed.    NICM:  Palliative care consulted.  Tachy therapy in device is disabled.  Seems to be euvolemic.   Resume PO Lasix.     Jeneen Rinks [facility] 08/19/2014 9:21 AM

## 2014-08-19 NOTE — Progress Notes (Signed)
ANTICOAGULATION CONSULT NOTE - Follow-Up Consult  Pharmacy Consult for Heparin Indication: h/o afib, mechanical aortic valve  No Known Allergies  Patient Measurements: Height: 5\' 9"  (175.3 cm) Weight: 202 lb 8.7 oz (91.873 kg) IBW/kg (Calculated) : 70.7 Heparin Dosing Weight: 88 kg  Vital Signs: Temp: 97.9 F (36.6 C) (08/12 2049) Temp Source: Oral (08/12 2049) BP: 114/69 mmHg (08/12 2049) Pulse Rate: 65 (08/12 2049)  Labs:  Recent Labs  08/17/14 0707 08/17/14 1650 08/17/14 2332 08/18/14 0405 08/19/14 0029  HGB 12.8*  --   --  12.5* 12.2*  HCT 39.7  --   --  38.4* 37.2*  PLT 125*  --   --  124* 115*  LABPROT 20.6*  --   --  18.8* 18.8*  INR 1.77*  --   --  1.57* 1.57*  HEPARINUNFRC 0.13* <0.10* 0.32  --  0.36  CREATININE 0.86  --   --  0.93 0.85    Estimated Creatinine Clearance: 82.8 mL/min (by C-G formula based on Cr of 0.85).   Assessment: 79 yo M on Coumadin PTA for hx afib and mechanical aortic valve. Coumadin was held for EGD. Heparin bridge continuing until INR therapeutic.  Heparin level 0.36 (therapeutic) on 1350 units/hr. No bleeding noted. Hgb, plt down slightly.  Goal of Therapy:  INR 2-3 Heparin level 0.3-0.7 units/ml Monitor platelets by anticoagulation protocol: Yes   Plan:  -continue heparin at 1350 units/hr -Will f/u daily heparin level  Sherlon Handing, PharmD, BCPS Clinical pharmacist, pager [phone] 08/19/2014 2:20 AM

## 2014-08-19 NOTE — Progress Notes (Signed)
[facility] PROGRESS NOTE  Jon Butler WHQ:[id number] DOB: 1935/04/07 DOA: 08/08/2014 PCP: [name], MD  Assessment/Plan: 1. Acute hypoxic respiratory failure- multifactorial due to community acquired pneumonia as well as systolic CHF. Improved clinically. Patient currently 95% on RA. IV Lasix was changed to oral Lasix per cardiology. Patient is status post 7 days of IV antibiotics and has appropriately been treated. Follow. 2. Acute on chronic systolic CHF- patient has nonischemic cardiomyopathy with EF 20%, followed by cardiology.  Patient currently euvolemic. Patient was on full liquids with aspiration precautions post GI procedure. IV Lasix has been changed to oral Lasix per cardiology.  Patient has been seen in consultation by palliative care and patient currently a DO NOT RESUSCITATE. Continue oral medications. Per cardiology. 3. Persistent atrial fibrillation- status post failed DCCV despite amiodarone on 08/10/2014. Patient remained in A. fib with rate controlled. Amiodarone has been stopped per cardiology. Rate seems to be controlled. Per cardiology.Coumadin resumed. 4. Community-acquired pneumonia--- clinical improvement. Patient with improved oxygenation requirements. Patient is status post 7 days of IV antibiotics. No further antibiotics needed at this time. 5. Dysphagia/Achalasia- patient underwent Esophogram, which showed distal esophageal obstruction versus spasm. GI has seen the patient and discussed options with the patient's family. Patient was also seen by palliative care, from their note seems as if patient and family want to go through procedure for esophageal stricture at this time. Patient status post upper endoscopy with Botox injection. Patient noted to have achalasia. Patient currently on full liquid diet. Advance as tolerated. Per GI.  6. History of mechanical aortic valve- patient back on warfarin. Patient's INR was reversed with 2 units of FFP and IV heparin started  in order for GI procedure. Postprocedure heparin bridge is not needed per cardiology. Coumadin has been resumed. Per cardiology. 7. History of alcohol abuse- continue thiamine 8. Hypokalemia- Repleted. Keep magnesium greater than 2. 9. Acute on chronic thrombocytopenia- stable. Follow.  10. DVT prophylaxis- SCDs as INR is subtherapeutic. Coumadin is resumed.   Code Status: Full code Family Communication: Updated patient and wife at bedside. Disposition Plan: Home with hospice once medically stable, and when okay with cardiology and after GI procedure.   Consultants:  Cardiology: Dr Haroldine Laws 08/09/2014  Gastroenterology: Dr. Cristina Gong 08/11/2014   Palliative care: Imelda Pillow, NP 08/13/2014  Procedures:  Esophogram 08/11/2014  Chest x-ray 08/08/2014, 08/09/2014, 08/11/2014  TEE with cardioversion (unsuccessful) 08/10/2014 Dr Croitoru  2 units FFP 08/16/2014  EGD with botox injection 08/18/14  Antibiotics:  IV azithromycin 08/08/2014>>>>>> 08/15/2014  IV Rocephin 08/08/2014>>>>>>> 08/15/2014  HPI/Subjective: 79 y.o. male with Past medical history of left bundle-branch block, chronic systolic CHF, LVEF 65%, aortic replacement with mechanical valve, chronic anticoagulation, atrial fibrillation, alcohol use., presented with complaints of shortness of breath. Wife reported the patient has been becoming more tired fatigue and lethargic and also has developed cough with sputum production, also had some shaking chills.Admitted with CAP and CHF. Esophogram  Showed  possible distal esophageal mass versus spasm. Gastroenterology was consulted, and patient seen by Dr. Cristina Gong. Currently awaiting INR to be subtherapeutic prior to upper endoscopy with intervention per GI. Patient is currently nothing by mouth. Cardiology following.     Patient with no complaints. No chest. No shortness of breath. Patient  tolerated full liquids. No complaints. Family at  bedside.   Objective: Filed Vitals:   08/19/14 0833  BP: 93/63  Pulse: 59  Temp: 97.7 F (36.5 C)  Resp: 18    Intake/Output Summary (Last 24 hours) at  08/19/14 1309 Last data filed at 08/19/14 1040  Gross per 24 hour  Intake   1440 ml  Output      0 ml  Net   1440 ml   Filed Weights   08/16/14 0551 08/16/14 2010 08/18/14 2049  Weight: 84.7 kg (186 lb 11.7 oz) 88.3 kg (194 lb 10.7 oz) 91.873 kg (202 lb 8.7 oz)    Exam:   General:  Appears in no acute distress    Cardiovascular: Irregularly irregular. Crisp click.   Respiratory: Clear to auscultation bilaterally  Abdomen: Soft, nontender, no organomegaly  Musculoskeletal: Trace to 1 + Bilateral  edema of the lower extremities  Data Reviewed: Basic Metabolic Panel:  Recent Labs Lab 08/14/14 0224 08/15/14 0250 08/16/14 0510 08/17/14 0707 08/18/14 0405 08/19/14 0029  NA 139 136 137 136 137 135  K 3.2* 4.0 4.2 4.0 3.9 3.7  CL 94* 94* 96* 94* 95* 97*  CO2 36* 33* 33* 33* 34* 29  GLUCOSE 87 98 85 91 83 108*  BUN 13 13 15 14 12 11   CREATININE 0.84 0.87 0.86 0.86 0.93 0.85  CALCIUM 8.2* 8.2* 8.5* 8.7* 8.6* 8.4*  MG 1.7 2.0  --   --   --   --    Liver Function Tests: No results for input(s): AST, ALT, ALKPHOS, BILITOT, PROT, ALBUMIN in the last 168 hours. No results for input(s): LIPASE, AMYLASE in the last 168 hours. No results for input(s): AMMONIA in the last 168 hours. CBC:  Recent Labs Lab 08/14/14 0224 08/15/14 0250 08/17/14 0707 08/18/14 0405 08/19/14 0029  WBC 6.2 6.5 6.4 5.1 5.4  HGB 12.4* 12.4* 12.8* 12.5* 12.2*  HCT 38.2* 38.1* 39.7 38.4* 37.2*  MCV 95.0 95.0 95.9 94.6 94.7  PLT 120* 125* 125* 124* 115*   Cardiac Enzymes: No results for input(s): CKTOTAL, CKMB, CKMBINDEX, TROPONINI in the last 168 hours. BNP (last 3 results)  Recent Labs  08/09/14 0745  BNP 2479.2*    ProBNP (last 3 results) No results for input(s): PROBNP in the last 8760 hours.  CBG: No results for  input(s): GLUCAP in the last 168 hours.  No results found for this or any previous visit (from the past 240 hour(s)).   Studies: No results found.  Scheduled Meds: . allopurinol  300 mg Oral Daily  . antiseptic oral rinse  7 mL Mouth Rinse q12n4p  . chlorhexidine gluconate  15 mL Mouth Rinse BID  . donepezil  23 mg Oral QHS  . furosemide  40 mg Oral Daily  . guaifenesin  600 mg Oral 4 times per day  . memantine  28 mg Oral Daily  . rosuvastatin  5 mg Oral Daily  . sodium chloride  3 mL Intravenous Q12H  . thiamine  100 mg Oral BID  . traZODone  400 mg Oral QHS  . warfarin  4 mg Oral ONCE-1800  . Warfarin - Pharmacist Dosing Inpatient   Does not apply q1800   Continuous Infusions: . sodium chloride 250 mL (08/16/14 2036)    Principal Problem:   Acute respiratory failure with hypoxia Active Problems:   Atrial fibrillation   Acute on chronic systolic heart failure   Community acquired pneumonia   Biventricular implantable cardioverter-defibrillator in situ   Long term current use of anticoagulant therapy   Sepsis   Alcohol use   Dysphagia   Palliative care encounter   Cognitive impairment   History of mechanical aortic valve replacement   Achalasia, esophageal    Time  spent: 35 min    [facility] MD [facility] Pager [phone]. If 7PM-7AM, please contact night-coverage at [email or web address], password [facility] 08/19/2014, 1:09 PM  LOS: 11 days

## 2014-08-20 LAB — BASIC METABOLIC PANEL
ANION GAP: 7 (ref 5–15)
BUN: 9 mg/dL (ref 6–20)
CALCIUM: 8.4 mg/dL — AB (ref 8.9–10.3)
CO2: 34 mmol/L — AB (ref 22–32)
Chloride: 96 mmol/L — ABNORMAL LOW (ref 101–111)
Creatinine, Ser: 1 mg/dL (ref 0.61–1.24)
GFR calc Af Amer: >60 mL/min (ref >60)
GLUCOSE: 103 mg/dL — AB (ref 65–99)
Potassium: 4.1 mmol/L (ref 3.5–5.1)
Sodium: 137 mmol/L (ref 135–145)

## 2014-08-20 LAB — CBC
HEMATOCRIT: 38.5 % — AB (ref 39.0–52.0)
Hemoglobin: 12.3 g/dL — ABNORMAL LOW (ref 13.0–17.0)
MCH: 31 pg (ref 26.0–34.0)
MCHC: 31.9 g/dL (ref 30.0–36.0)
MCV: 97 fL (ref 78.0–100.0)
Platelets: 115 10*3/uL — ABNORMAL LOW (ref 150–400)
RBC: 3.97 MIL/uL — AB (ref 4.22–5.81)
RDW: 16.9 % — AB (ref 11.5–15.5)
WBC: 5.5 10*3/uL (ref 4.0–10.5)

## 2014-08-20 LAB — PROTIME-INR
INR: 1.89 — ABNORMAL HIGH (ref 0.00–1.49)
PROTHROMBIN TIME: 21.7 s — AB (ref 11.6–15.2)

## 2014-08-20 LAB — HEPARIN LEVEL (UNFRACTIONATED): Heparin Unfractionated: <0.1 IU/mL — ABNORMAL LOW (ref 0.30–0.70)

## 2014-08-20 MED ORDER — WARFARIN SODIUM 3 MG PO TABS
3.0000 mg | ORAL_TABLET | Freq: Once | ORAL | Status: AC
Start: 2014-08-20 — End: 2014-08-20
  Administered 2014-08-20: 3 mg via ORAL
  Filled 2014-08-20: qty 1

## 2014-08-20 NOTE — Progress Notes (Signed)
[facility] PROGRESS NOTE  Jon Butler KGU:[id number] DOB: June 21, 1935 DOA: 08/08/2014 PCP: [name], MD  Assessment/Plan: 1. Acute hypoxic respiratory failure- multifactorial due to community acquired pneumonia as well as systolic CHF. Improved clinically. Patient currently 95% on RA. IV Lasix was changed to oral Lasix per cardiology. Patient is status post 7 days of IV antibiotics and has appropriately been treated. Follow. 2. Acute on chronic systolic CHF- patient has nonischemic cardiomyopathy with EF 20%, followed by cardiology.  Patient currently euvolemic. Patient was on full liquids with aspiration precautions post GI procedure. IV Lasix has been changed to oral Lasix per cardiology.  Patient has been seen in consultation by palliative care and patient currently a DO NOT RESUSCITATE. Continue oral medications. Per cardiology. 3. Persistent atrial fibrillation- status post failed DCCV despite amiodarone on 08/10/2014. Patient remained in A. fib with rate controlled. Amiodarone has been stopped per cardiology. Rate seems to be controlled. Per cardiology.Coumadin resumed. 4. Community-acquired pneumonia--- clinical improvement. Patient with improved oxygenation requirements. Patient is status post 7 days of IV antibiotics. No further antibiotics needed at this time. 5. Dysphagia/Achalasia- patient underwent Esophogram, which showed distal esophageal obstruction versus spasm. GI has seen the patient and discussed options with the patient's family. Patient was also seen by palliative care, from their note seems as if patient and family want to go through procedure for esophageal stricture at this time. Patient status post upper endoscopy with Botox injection. Patient noted to have achalasia. Patient currently on full liquid diet and diet has been advanced as tolerated to heart healthy diet. Per GI.  6. History of mechanical aortic valve- patient back on warfarin. Patient's INR was reversed  with 2 units of FFP and IV heparin started in order for GI procedure. Postprocedure, heparin bridge is not needed per cardiology. Coumadin has been resumed. INR at 1.89. Per cardiology. 7. History of alcohol abuse- continue thiamine 8. Hypokalemia- Repleted. Keep magnesium greater than 2. 9. Acute on chronic thrombocytopenia- stable. Follow.  10. DVT prophylaxis- SCDs as INR is subtherapeutic. Coumadin is resumed.  11. Prognosis--- patient with probable end-stage systolic heart failure with poor ejection fraction and had been seen in consultation by palliative care for goals of care. Goals of care was discussed with the family. Palliative note patient was made a DO NOT RESUSCITATE and recommendations were for home with hospice. Patient's son [name], who is healthcare power of attorney states that his mother may have agreed to full hospice when patient goes home without understanding everything and as such was to rediscuss it palliative care on goals of care. Will have palliative care meeting with [name], health care power of attorney and family for redo of goals of care.  Code Status: Full code Family Communication: Updated patient and wife at bedside. Disposition Plan: Home with home health when okay with cardiology and after GI procedure.   Consultants:  Cardiology: Dr Haroldine Laws 08/09/2014  Gastroenterology: Dr. Cristina Gong 08/11/2014   Palliative care: Imelda Pillow, NP 08/13/2014  Procedures:  Esophogram 08/11/2014  Chest x-ray 08/08/2014, 08/09/2014, 08/11/2014  TEE with cardioversion (unsuccessful) 08/10/2014 Dr Croitoru  2 units FFP 08/16/2014  EGD with botox injection 08/18/14  Antibiotics:  IV azithromycin 08/08/2014>>>>>> 08/15/2014  IV Rocephin 08/08/2014>>>>>>> 08/15/2014  HPI/Subjective: 79 y.o. male with Past medical history of left bundle-branch block, chronic systolic CHF, LVEF 62%, aortic replacement with mechanical valve, chronic anticoagulation, atrial  fibrillation, alcohol use., presented with complaints of shortness of breath. Wife reported the patient has been becoming more tired fatigue  and lethargic and also has developed cough with sputum production, also had some shaking chills.Admitted with CAP and CHF. Esophogram  Showed  possible distal esophageal mass versus spasm. Gastroenterology was consulted, and patient seen by Dr. Cristina Gong.  Patient's INR was reversed and patient underwent upper endoscopy with Botox injection for achalasia. Patient was started back on his Coumadin without a heparin bridge. Patient was started on a diet on full liquids and subsequently been advanced as tolerated.     Patient with no complaints. No chest. No shortness of breath. Patient tolerated full liquids. Patient has been transitioned to solid diet.   Objective: Filed Vitals:   08/20/14 0832  BP: 100/57  Pulse: 59  Temp: 98 F (36.7 C)  Resp: 18    Intake/Output Summary (Last 24 hours) at 08/20/14 1303 Last data filed at 08/20/14 0912  Gross per 24 hour  Intake    960 ml  Output    375 ml  Net    585 ml   Filed Weights   08/16/14 0551 08/16/14 2010 08/18/14 2049  Weight: 84.7 kg (186 lb 11.7 oz) 88.3 kg (194 lb 10.7 oz) 91.873 kg (202 lb 8.7 oz)    Exam:   General:  Appears in no acute distress    Cardiovascular: Irregularly irregular. Crisp click.   Respiratory: Clear to auscultation bilaterally  Abdomen: Soft, nontender, no organomegaly  Musculoskeletal: Trace Bilateral  edema of the lower extremities  Data Reviewed: Basic Metabolic Panel:  Recent Labs Lab 08/14/14 0224 08/15/14 0250 08/16/14 0510 08/17/14 0707 08/18/14 0405 08/19/14 0029 08/20/14 0456  NA 139 136 137 136 137 135 137  K 3.2* 4.0 4.2 4.0 3.9 3.7 4.1  CL 94* 94* 96* 94* 95* 97* 96*  CO2 36* 33* 33* 33* 34* 29 34*  GLUCOSE 87 98 85 91 83 108* 103*  BUN 13 13 15 14 12 11 9   CREATININE 0.84 0.87 0.86 0.86 0.93 0.85 1.00  CALCIUM 8.2* 8.2* 8.5* 8.7*  8.6* 8.4* 8.4*  MG 1.7 2.0  --   --   --   --   --    Liver Function Tests: No results for input(s): AST, ALT, ALKPHOS, BILITOT, PROT, ALBUMIN in the last 168 hours. No results for input(s): LIPASE, AMYLASE in the last 168 hours. No results for input(s): AMMONIA in the last 168 hours. CBC:  Recent Labs Lab 08/15/14 0250 08/17/14 0707 08/18/14 0405 08/19/14 0029 08/20/14 0456  WBC 6.5 6.4 5.1 5.4 5.5  HGB 12.4* 12.8* 12.5* 12.2* 12.3*  HCT 38.1* 39.7 38.4* 37.2* 38.5*  MCV 95.0 95.9 94.6 94.7 97.0  PLT 125* 125* 124* 115* 115*   Cardiac Enzymes: No results for input(s): CKTOTAL, CKMB, CKMBINDEX, TROPONINI in the last 168 hours. BNP (last 3 results)  Recent Labs  08/09/14 0745  BNP 2479.2*    ProBNP (last 3 results) No results for input(s): PROBNP in the last 8760 hours.  CBG: No results for input(s): GLUCAP in the last 168 hours.  No results found for this or any previous visit (from the past 240 hour(s)).   Studies: No results found.  Scheduled Meds: . allopurinol  300 mg Oral Daily  . antiseptic oral rinse  7 mL Mouth Rinse q12n4p  . chlorhexidine gluconate  15 mL Mouth Rinse BID  . donepezil  23 mg Oral QHS  . furosemide  40 mg Oral Daily  . guaifenesin  600 mg Oral 4 times per day  . memantine  28 mg Oral Daily  .  rosuvastatin  5 mg Oral Daily  . sodium chloride  3 mL Intravenous Q12H  . thiamine  100 mg Oral BID  . traZODone  400 mg Oral QHS  . warfarin  3 mg Oral ONCE-1800  . Warfarin - Pharmacist Dosing Inpatient   Does not apply q1800   Continuous Infusions: . sodium chloride 250 mL (08/16/14 2036)    Principal Problem:   Acute respiratory failure with hypoxia Active Problems:   Atrial fibrillation   Acute on chronic systolic heart failure   Community acquired pneumonia   Biventricular implantable cardioverter-defibrillator in situ   Long term current use of anticoagulant therapy   Sepsis   Alcohol use   Dysphagia   Palliative care  encounter   Cognitive impairment   History of mechanical aortic valve replacement   Achalasia, esophageal    Time spent: 35 min    [facility] MD [facility] Pager [phone]. If 7PM-7AM, please contact night-coverage at [email or web address], password [facility] 08/20/2014, 1:03 PM  LOS: 12 days

## 2014-08-20 NOTE — Progress Notes (Signed)
Son [name] who has HCPOA at bedside wanting to speak with doctors about DNR status, says he wants a "full 'DNR' conversation" and no one has had it with him. Son also concerned about pt's meds and amiodarone not being continued after GI signed off. Son is concerned that his father's rhythm is not being controlled and he gets asymptomatic. Reassured pt that I would defer his questions to MD.    Penni Bombard, RN 08/20/2014 11:19 AM

## 2014-08-20 NOTE — Care Management Note (Signed)
Case Management Note  Patient Details  Name: Jon Butler MRN: [id number] Date of Birth: [birth year]  Subjective/Objective:    CHF, A- Fib, CAP                 Action/Plan: Home with [facility] services  Expected Discharge Date:    08/21/14             Expected Discharge Plan:  [place]  In-House Referral:     Discharge planning Services  CM Consult  Post Acute Care Choice:    Choice offered to:  Adult Children  DME Arranged:   (Patient has rolling walker at home) DME Agency:     HH Arranged:  RN, OT, PT, Nurse's Aide [place] Agency:  [place]  Status of Service:  Completed, signed off/   Medicare Important Message Given:  Yes-fourth notification given Date Medicare IM Given:    Medicare IM give by:    Date Additional Medicare IM Given:    Additional Medicare Important Message give by:     If discussed at [place] of Stay Meetings, dates discussed:    Additional Comments CM met with patient and Son Waldemar Dickens to discuss recommendations for [facility] services and goals of care.  .Patient"s son states, that his father is weak and wants him to get all the care services he can receive at home, an not have to pay. Explained that [facility] services are skilled services which  are covered by most insurances but, the frequency of the services would not be daily. Patient and son verbalized understanding, and is agreeable to [facility] services RN/PT/OT/ HHA. Choice offered, son selected [place]. Family states they have equipment rolling  walker at home. Referral called in to [facility]. As per Butch Penny RN services will not be available until near end of the week, family made aware and are amenable. Patient and family does not verbalize and additional questions or concerns at this time. Patient expected to discharge home with family in the am.  Laurena Slimmer, RN  08/20/2014, 1:05 PM

## 2014-08-20 NOTE — Evaluation (Signed)
Physical Therapy Evaluation Patient Details Name: Jon Butler MRN: [id number] DOB: 1935-07-31 Today's Date: 08/20/2014   History of Present Illness  79 y.o. male with Past medical history of left bundle-branch block, chronic systolic CHF, LVEF 02%, aortic replacement with mechanical valve, chronic anticoagulation, atrial fibrillation, alcohol use., presented with complaints of shortness of breath.  Clinical Impression   Pt admitted with above diagnosis. Pt currently with functional limitations due to the deficits listed below (see PT Problem List).  Pt will benefit from skilled PT to increase their independence and safety with mobility to allow discharge to the venue listed below.       Follow Up Recommendations Home health PT;Supervision/Assistance - 24 hour (HHOT, Aide, [facility])    Equipment Recommendations  3in1 (PT);Rolling walker with 5" wheels (pt may already have RW)    Recommendations for Other Services OT consult     Precautions / Restrictions Precautions Precautions: Fall Restrictions Weight Bearing Restrictions: No      Mobility  Bed Mobility Overal bed mobility: Needs Assistance Bed Mobility: Supine to Sit     Supine to sit: Mod assist     General bed mobility comments: Needing cues to initiate; mod handheld assist to pull to sit  Transfers Overall transfer level: Needs assistance Equipment used: 1 person hand held assist Transfers: Sit to/from Stand Sit to Stand: Mod assist         General transfer comment: Cues for safety and hand placement; mod assist to power up  Ambulation/Gait Ambulation/Gait assistance: Min guard Ambulation Distance (Feet): 120 Feet Assistive device: Rolling walker (2 wheeled) (pushing Dinamap) Gait Pattern/deviations: Decreased dorsiflexion - right;Decreased dorsiflexion - left;Wide base of support   Gait velocity interpretation: Below normal speed for age/gender General Gait Details: Generally unsteady with noted decr  heel strike; definitely benefits from bil UE support of rW  Stairs            Wheelchair Mobility    Modified Rankin (Stroke Patients Only)       Balance Overall balance assessment: Needs assistance           Standing balance-Leahy Scale: Poor          Patient denies history of falls; Per son he has fallen, and noted scabs on knees, evidence of earlier falls                     Pertinent Vitals/Pain Pain Assessment: No/denies pain    Home Living Family/patient expects to be discharged to:: Private residence Living Arrangements: Spouse/significant other Available Help at Discharge: Family;Available 24 hours/day Type of Home: House Home Access: Stairs to enter Entrance Stairs-Rails: Right Entrance Stairs-Number of Steps: 3 Home Layout: One level Home Equipment: Walker - 2 wheels      Prior Function Level of Independence: Needs assistance   Gait / Transfers Assistance Needed: Household distances; pt states he doesn't use an assistive device  ADL's / Homemaking Assistance Needed: Inferred that there are issues with bathing and bathroom as Jon Butler son states they are planning to renovate  Comments: May need to delve a bit deeper re: PLOF; pt is not forthcoming with information, and son hesitates to make corrections in front of pt     Hand Dominance        Extremity/Trunk Assessment   Upper Extremity Assessment: Generalized weakness;Defer to OT evaluation           Lower Extremity Assessment: Generalized weakness (Noted bilateral ankle dorsiflexion 3/5; 4/5 L knee extension,  compared to 5/5 R knee extension)      Cervical / Trunk Assessment: Normal  Communication   Communication: No difficulties  Cognition Arousal/Alertness: Awake/alert Behavior During Therapy: WFL for tasks assessed/performed Overall Cognitive Status: Impaired/Different from baseline Area of Impairment: Safety/judgement     Memory: Decreased short-term memory    Safety/Judgement: Decreased awareness of safety;Decreased awareness of deficits     General Comments: Noting evidence of short-term memory deficits; Jon Butler does not remember circumstances that led to his admission to the hospital; He doesn't remember the recent installation of a rail at his front door; At end of session he does not remember stating that the RW helped during our earlier walk    General Comments General comments (skin integrity, edema, etc.): Session conducted on Room Air and noted O2 sats stayed greater than or equal to 94%    Exercises        Assessment/Plan    PT Assessment Patient needs continued PT services  PT Diagnosis Difficulty walking;Generalized weakness   PT Problem List Decreased strength;Decreased range of motion;Decreased activity tolerance;Decreased balance;Decreased mobility;Decreased coordination;Decreased cognition;Decreased knowledge of use of DME;Decreased safety awareness;Decreased knowledge of precautions  PT Treatment Interventions DME instruction;Gait training;Stair training;Functional mobility training;Therapeutic activities;Therapeutic exercise;Balance training;Cognitive remediation   PT Goals (Current goals can be found in the Care Plan section) Acute Rehab PT Goals Patient Stated Goal: Jon Butler and family hope to get home PT Goal Formulation: With patient/family Time For Goal Achievement: 09/03/14 Potential to Achieve Goals: Good    Frequency Min 3X/week   Barriers to discharge        Co-evaluation               End of Session Equipment Utilized During Treatment: Gait belt Activity Tolerance: Patient tolerated treatment well Patient left: in chair;with call bell/phone within reach;with family/visitor present Nurse Communication: Mobility status         Time: 1002-1030 PT Time Calculation (min) (ACUTE ONLY): 28 min   Charges:   PT Evaluation $Initial PT Evaluation Tier I: 1 Procedure PT Treatments $Gait  Training: 8-22 mins   PT G Codes:        Jon Butler 08/20/2014, 11:10 AM  Jon Butler, Jon Butler Pager [phone] Office [phone]

## 2014-08-20 NOTE — Progress Notes (Signed)
ANTICOAGULATION CONSULT NOTE - Follow Up Consult  Pharmacy Consult for coumadin Indication: hx of mech AVR and afib  No Known Allergies  Patient Measurements: Height: 5\' 9"  (175.3 cm) Weight: 202 lb 8.7 oz (91.873 kg) IBW/kg (Calculated) : 70.7 Heparin Dosing Weight:   Vital Signs: Temp: 98 F (36.7 C) (08/14 0832) Temp Source: Oral (08/14 0832) BP: 100/57 mmHg (08/14 0832) Pulse Rate: 59 (08/14 0832)  Labs:  Recent Labs  08/17/14 2332  08/18/14 0405 08/19/14 0029 08/20/14 0456  HGB  --   < > 12.5* 12.2* 12.3*  HCT  --   --  38.4* 37.2* 38.5*  PLT  --   --  124* 115* 115*  LABPROT  --   --  18.8* 18.8* 21.7*  INR  --   --  1.57* 1.57* 1.89*  HEPARINUNFRC 0.32  --   --  0.36 <0.10*  CREATININE  --   --  0.93 0.85 1.00  < > = values in this interval not displayed.  Estimated Creatinine Clearance: 70.4 mL/min (by C-G formula based on Cr of 1).   Medications:  Scheduled:  . allopurinol  300 mg Oral Daily  . antiseptic oral rinse  7 mL Mouth Rinse q12n4p  . chlorhexidine gluconate  15 mL Mouth Rinse BID  . donepezil  23 mg Oral QHS  . furosemide  40 mg Oral Daily  . guaifenesin  600 mg Oral 4 times per day  . memantine  28 mg Oral Daily  . rosuvastatin  5 mg Oral Daily  . sodium chloride  3 mL Intravenous Q12H  . thiamine  100 mg Oral BID  . traZODone  400 mg Oral QHS  . Warfarin - Pharmacist Dosing Inpatient   Does not apply q1800   Infusions:  . sodium chloride 250 mL (08/16/14 2036)    Assessment: 79 yo male with hx of mech AVR and afib is currently on subtherapeutic coumadin.  INR is up to 1.89 from 1.57.  Team only wants coumadin.  Patient got FFP few days ago Corbin Hott may delay increase in INR.  Goal of Therapy:  INR 2-3 Monitor platelets by anticoagulation protocol: Yes   Plan:  - coumadin 3 mg po x1 - INR in am  Celise Bazar, Tsz-Yin 08/20/2014,11:48 AM

## 2014-08-21 ENCOUNTER — Encounter (HOSPITAL_COMMUNITY): Payer: Self-pay | Admitting: Gastroenterology

## 2014-08-21 ENCOUNTER — Encounter (HOSPITAL_COMMUNITY): Payer: Medicare Other

## 2014-08-21 LAB — PROTIME-INR
INR: 2.29 — AB (ref 0.00–1.49)
Prothrombin Time: 25 seconds — ABNORMAL HIGH (ref 11.6–15.2)

## 2014-08-21 MED ORDER — LORAZEPAM 0.5 MG PO TABS: 0.5000 mg | ORAL_TABLET | Freq: Three times a day (TID) | ORAL | Status: DC | PRN

## 2014-08-21 MED ORDER — WARFARIN SODIUM 3 MG PO TABS: 3.0000 mg | ORAL_TABLET | ORAL | Status: DC

## 2014-08-21 MED ORDER — CHLORHEXIDINE GLUCONATE 0.12 % MT SOLN
15.0000 mL | Freq: Two times a day (BID) | OROMUCOSAL | Status: DC
  Administered 2014-08-21: 15 mL via OROMUCOSAL
  Filled 2014-08-21: qty 15

## 2014-08-21 MED ORDER — WARFARIN SODIUM 2 MG PO TABS
2.0000 mg | ORAL_TABLET | ORAL | Status: DC
  Filled 2014-08-21: qty 1

## 2014-08-21 NOTE — Progress Notes (Signed)
Occupational Therapy Evaluation Patient Details Name: Jon Butler MRN: [id number] DOB: November 08, 1935 Today's Date: 08/21/2014    History of Present Illness 79 y.o. male with Past medical history of left bundle-branch block, chronic systolic CHF, LVEF 71%, aortic replacement with mechanical valve, chronic anticoagulation, atrial fibrillation, alcohol use., presented with complaints of shortness of breath.   Clinical Impression   Patient presents to OT with decreased ADL independence and safety due to the functional limitations below. He will benefit from skilled OT to maximize function and to facilitate a safe discharge. OT will follow.    Follow Up Recommendations  Home health OT; Supervision/Assistance - 24 hour (also [place] aide)    Equipment Recommendations  None recommended by OT    Recommendations for Other Services       Precautions / Restrictions Precautions Precautions: Fall Restrictions Weight Bearing Restrictions: No      Mobility Bed Mobility Overal bed mobility: Needs Assistance Bed Mobility: Supine to Sit;Sit to Supine     Supine to sit: Supervision Sit to supine: Supervision      Transfers Overall transfer level: Needs assistance Equipment used: Rolling walker (2 wheeled) Transfers: Sit to/from Stand Sit to Stand: Min assist         General transfer comment: cues for safety and hand placement    Balance                                            ADL Overall ADL's : Needs assistance/impaired Eating/Feeding: Set up;Sitting   Grooming: Wash/dry hands;Wash/dry face;Oral care;Brushing hair;Min guard;Standing;Cueing for sequencing               Lower Body Dressing: Minimal assistance;Sit to/from stand   Toilet Transfer: Minimal assistance;Ambulation;Regular Toilet;RW;Grab bars           Functional mobility during ADLs: Minimal assistance;Cueing for safety;Rolling walker General ADL Comments: Son present during  session. Patient requires regular cueing for walker safety (likes to pick it up during turns, hand placement, leaves it behind, etc.). Patient was able to stand at sink to groom with sequencing cues. Has poor insight into his deficits and decreased safety awareness. Denies he has fallen at home but son corrected him. Recommend close supervision/PRN assistance with ADLs and when patient is up on his feet with RW. Son and pt report wife can provide that assistance but cannot do any lifting assistance. He has a 3 in 1 and a shower chair at home. We discussed using those, as well as RW, for safety. Patient and son in agreement. OT will follow.     Vision     Perception     Praxis      Pertinent Vitals/Pain Pain Assessment: No/denies pain     Hand Dominance Right   Extremity/Trunk Assessment Upper Extremity Assessment Upper Extremity Assessment: Overall WFL for tasks assessed;Generalized weakness   Lower Extremity Assessment Lower Extremity Assessment: Defer to PT evaluation   Cervical / Trunk Assessment Cervical / Trunk Assessment: Normal   Communication Communication Communication: No difficulties   Cognition Arousal/Alertness: Awake/alert Behavior During Therapy: WFL for tasks assessed/performed Overall Cognitive Status: Impaired/Different from baseline Area of Impairment: Safety/judgement     Memory: Decreased short-term memory   Safety/Judgement: Decreased awareness of safety;Decreased awareness of deficits         General Comments       Exercises  Shoulder Instructions      Home Living Family/patient expects to be discharged to:: Private residence Living Arrangements: Spouse/significant other Available Help at Discharge: Family;Available 24 hours/day Type of Home: House Home Access: Stairs to enter [employer] of Steps: 3 Entrance Stairs-Rails: Right Home Layout: One level     Bathroom Shower/Tub: Teacher, Zai Chmiel years/pre:  Standard     Home Equipment: Environmental consultant - 2 wheels;Bedside commode;Shower seat          Prior Functioning/Environment Level of Independence: Needs assistance  Gait / Transfers Assistance Needed: Household distances; pt states he doesn't use an assistive device ADL's / Homemaking Assistance Needed: patient reports independence with BADLs; son confirms but states "it took a lot of time"        OT Diagnosis: Generalized weakness;Cognitive deficits   OT Problem List: Decreased activity tolerance;Impaired balance (sitting and/or standing);Decreased strength;Decreased cognition;Decreased safety awareness   OT Treatment/Interventions: Self-care/ADL training;Therapeutic exercise;DME and/or AE instruction;Therapeutic activities;Patient/family education    OT Goals(Current goals can be found in the care plan section) Acute Rehab OT Goals Patient Stated Goal: to go home OT Goal Formulation: With patient Time For Goal Achievement: 09/04/14 Potential to Achieve Goals: Good  OT Frequency: Min 2X/week   Barriers to D/C:            Co-evaluation              End of Session Equipment Utilized During Treatment: Surveyor, mining Communication: Other (comment) (L hand skin tear)  Activity Tolerance: Patient tolerated treatment well Patient left: in bed;with call bell/phone within reach;with family/visitor present   Time: 6789-3810 OT Time Calculation (min): 27 min Charges:  OT General Charges $OT Visit: 1 Procedure OT Evaluation $Initial OT Evaluation Tier I: 1 Procedure OT Treatments $Self Care/Home Management : 8-22 mins G-Codes:    Jon Butler A September 05, 2014, 12:18 PM

## 2014-08-21 NOTE — Care Management Important Message (Signed)
Important Message  Patient Details  Name: Jon Butler MRN: [id number] Date of Birth: [birth year]   Medicare Important Message Given:  Yes-second notification given    Delorse Lek 08/21/2014, 3:45 PM

## 2014-08-21 NOTE — Progress Notes (Signed)
Spoke with Dr. Hilma Favors, said she would call son. When asked son if he got every thing straight with palliative medicine he responded, "Everything is checked off of your box."

## 2014-08-21 NOTE — Progress Notes (Signed)
ANTICOAGULATION CONSULT NOTE - Follow Up Consult  Pharmacy Consult for Coumadin Indication: atrial fibrillation + MVR  No Known Allergies  Patient Measurements: Height: 5\' 9"  (175.3 cm) Weight: 202 lb 8.7 oz (91.873 kg) IBW/kg (Calculated) : 70.7  Vital Signs: Temp: 97.9 F (36.6 C) (08/15 0508) Temp Source: Oral (08/15 0508) BP: 110/67 mmHg (08/15 0508) Pulse Rate: 58 (08/15 0508)  Labs:  Recent Labs  08/19/14 0029 08/20/14 0456 08/21/14 0418  HGB 12.2* 12.3*  --   HCT 37.2* 38.5*  --   PLT 115* 115*  --   LABPROT 18.8* 21.7* 25.0*  INR 1.57* 1.89* 2.29*  HEPARINUNFRC 0.36 <0.10*  --   CREATININE 0.85 1.00  --     Estimated Creatinine Clearance: 70.4 mL/min (by C-G formula based on Cr of 1).   Assessment:  AC: hx mech AVR+ afib. S/p TEE, DCCV 8/4 unsuccessful. Got 2 units FFP 8/10 as INR trended up without warf>>down to 1.77. INR up to 2.29 today. Hgb stable. Plts 115 low but holding stable. -INR Goal 2-3 set PTA. PTA dose is 3 mg on Tues/Sat and 2 mg all other days.    Goal of Therapy:  INR 2-3 Monitor platelets by anticoagulation protocol: Yes   Plan:  -Resume home regimen 3 mg on Tues/Sat and 2 mg all other days.  -daily INR    Jon Butler [place], PharmD, [facility] Clinical Staff Pharmacist Pager [phone]  Eilene Ghazi Stillinger 08/21/2014,10:17 AM

## 2014-08-21 NOTE — Progress Notes (Signed)
Jon Butler to be D/C'd Home per MD order.  Discussed prescriptions and follow up appointments with the patient. Prescriptions given to patient, medication list explained in detail. Pt verbalized understanding.    Medication List    STOP taking these medications        amiodarone 400 MG tablet  Commonly known as:  PACERONE     amoxicillin 500 MG capsule  Commonly known as:  AMOXIL      TAKE these medications        allopurinol 300 MG tablet  Commonly known as:  ZYLOPRIM  Take 300 mg by mouth daily.     aspirin EC 81 MG tablet  Take 1 tablet (81 mg total) by mouth daily.     carvedilol 3.125 MG tablet  Commonly known as:  COREG  Take 2 tablets (6.25 mg total) by mouth 2 (two) times daily with a meal.     CO Q-10 PO  Take 1 tablet by mouth daily.     colchicine 0.6 MG tablet  Take 0.6 mg by mouth daily as needed (gout pain).     donepezil 23 MG Tabs tablet  Commonly known as:  ARICEPT  Take 1 tablet (23 mg total) by mouth at bedtime.     fluticasone 50 MCG/ACT nasal spray  Commonly known as:  FLONASE  Place 2 sprays into both nostrils daily as needed for allergies or rhinitis.     furosemide 40 MG tablet  Commonly known as:  LASIX  Take 1 tablet (40 mg total) by mouth daily.     lisinopril 10 MG tablet  Commonly known as:  PRINIVIL,ZESTRIL  Take 0.5 tablets (5 mg total) by mouth at bedtime.     LORazepam 0.5 MG tablet  Commonly known as:  ATIVAN  Take 1 tablet (0.5 mg total) by mouth every 8 (eight) hours as needed for anxiety.     memantine 28 MG Cp24 24 hr capsule  Commonly known as:  NAMENDA XR  Take 1 capsule (28 mg total) by mouth daily.     metolazone 2.5 MG tablet  Commonly known as:  ZAROXOLYN  Take 1 tablet (2.5 mg total) by mouth daily.     multivitamin with minerals tablet  Take 1 tablet by mouth daily.     potassium chloride SA 20 MEQ tablet  Commonly known as:  K-DUR,KLOR-CON  Take 1 tablet (20 mEq total) by mouth daily. Take 1 tab when  you take Metolazone     rosuvastatin 10 MG tablet  Commonly known as:  CRESTOR  Take 5 mg by mouth daily.     spironolactone 25 MG tablet  Commonly known as:  ALDACTONE  Take 0.5 tablets (12.5 mg total) by mouth daily.     thiamine 100 MG tablet  Commonly known as:  VITAMIN B-1  Take 1 tablet (100 mg total) by mouth 2 (two) times daily.     traZODone 100 MG tablet  Commonly known as:  DESYREL  Take 400 mg by mouth at bedtime.     warfarin 2 MG tablet  Commonly known as:  COUMADIN  Take as directed by coumadin clinic  Notes to Patient:  Resume previous perscription        Filed Vitals:   08/21/14 0508  BP: 110/67  Pulse: 58  Temp: 97.9 F (36.6 C)  Resp: 18    Skin clean, dry and intact without evidence of skin break down, no evidence of skin tears noted. IV catheter discontinued  intact. Site without signs and symptoms of complications. Dressing and pressure applied. Pt denies pain at this time. No complaints noted.  An After Visit Summary was printed and given to the patient. Patient escorted via [place], and D/C home via private auto.  Sydnee Lamour A 08/21/2014 2:27 PM

## 2014-08-21 NOTE — Discharge Summary (Signed)
Physician Discharge Summary  Jon Butler KVQ:[id number] DOB: 1935-05-11 DOA: 08/08/2014  PCP: [name], MD  Admit date: 08/08/2014 Discharge date: 08/21/2014  Time spent: 65 minutes  Recommendations for Outpatient Follow-up:  1. Follow up HUSAIN,KARRAR, MD in 2 weeks. On follow up patient will need BMET to follow up on electrolytes and renal function. 2. Follow up with Jon Haroldine Laws, heart failure clinic in 2 weeks.  Discharge Diagnoses:  Principal Problem:   Acute respiratory failure with hypoxia Active Problems:   Atrial fibrillation   Acute on chronic systolic heart failure   Community acquired pneumonia   Biventricular implantable cardioverter-defibrillator in situ   Long term current use of anticoagulant therapy   Sepsis   Alcohol use   Dysphagia   Palliative care encounter   Cognitive impairment   History of mechanical aortic valve replacement   Achalasia, esophageal   Discharge Condition: stable and improved.  Diet recommendation: low salt diet/ heart healthy  Filed Weights   08/16/14 0551 08/16/14 2010 08/18/14 2049  Weight: 84.7 kg (186 lb 11.7 oz) 88.3 kg (194 lb 10.7 oz) 91.873 kg (202 lb 8.7 oz)    History of present illness:  Jon Butler is a 79 y.o. male with Past medical history of left bundle-branch block, chronic systolic CHF, LVEF 64%, aortic replacement with mechanical valve, chronic anticoagulation, atrial fibrillation, alcohol use. The patient presented with complaints of shortness of breath. The history was limited since the family was not available and the patient denied having any acute complaints. As per the documentation wife had reported the patient had been becoming more tired fatigue and lethargic and also has developed cough with sputum production. He also had some shaking chills. With this at home when they check his oxygenation and he was saturating 81% on room air with elevated heart rate and therefore they brought to the  hospital. The patient was seen by cardiology recently and was scheduled for an outpatient cardioversion for his A. fib. Patient does have chronic orthopnea and PND. Reportedly the patient had gained 10 pounds from his baseline but as per documentation the patient is actually lower than his recent visit with Jon. Haroldine Laws. Reportedly the patient is compliant with all his medications.  The patient is coming from home.  At his baseline ambulates with walkerAnd is dependent for most of his ADL does not manages his medication on his own.   Hospital Course:  1. Acute hypoxic respiratory failure- patient had presented with acute hypoxic respiratory failure felt to be multifactorial secondary to community acquired pneumonia as well as systolic CHF. Patient was placed on IV antibiotics as well as IV Lasix and follow. Cardiology was also consulted and followed the patient throughout the hospitalization. Patient improved clinically and received 7 days of IV antibiotics. Patient was subsequently transitioned to his home dose oral Lasix and was back to his baseline by day of discharge. Patient be discharged home on his home regimen of diuretics and is to follow-up with PCP and cardiologist of the heart failure clinic. Patient will be discharged in stable and improved condition. 2. Acute on chronic systolic CHF- patient has nonischemic cardiomyopathy with EF 20%, followed by cardiology.Patient had presented with acute hypoxic respiratory failure felt to be in part secondary to acute on chronic systolic heart failure. Patient was placed on IV Lasix and patient diuresed. Patient was followed by cardiology throughout the hospitalization. Patient improved clinically and was subsequently transitioned to oral Lasix. Patient will be discharged in stable and  improved condition and will follow-up with cardiology as outpatient. He'll be resumed back on his home regimen of diuretics. And is to follow-up at the heart failure  clinic. 3. Persistent atrial fibrillation- status post failed DCCV despite amiodarone on 08/10/2014. Patient remained in A. fib with rate controlled. Amiodarone has been stopped per cardiology. Rate seems to be controlled. Patient's Coumadin had to be held during the hospitalization in preparation for GI procedure. Patient's Coumadin was resumed post procedure, patient will follow-up with cardiology as outpatient.  4. Community-acquired pneumonia--- Patient had presented with acute hypoxic respiratory failure noted to have a pneumonia. Patient was placed empirically on IV antibiotics. Speech therapy evaluation was done and patient was noted to be aspirating. Patient received 7 days empiric IV antibiotics. Patient's oxygen requirements improved. Patient underwent an esophagram that showed distal esophageal obstruction versus spasms. Patient was seen in consultation by GI and underwent an upper endoscopy with botox injection. Patient improved clinically and patient received a full course of antibiotic therapy. Outpatient follow-up.  5. Dysphagia/Achalasia- patient underwent Esophogram, which showed distal esophageal obstruction versus spasm. GI has seen the patient and discussed options with the patient's family. Patient was also seen by palliative care, from their note seems as if patient and family want to go through procedure for esophageal stricture at this time. Patient status post upper endoscopy with Botox injection. Patient noted to have achalasia. Patient currently on full liquid diet and diet has been advanced as tolerated to heart healthy diet which patient tolerated. Patient was discharged on a heart healthy diet. 6. History of mechanical aortic valve- during the hospitalization in preparation for GI procedure patient's Coumadin was held until his INR was less than 2. Patient INR had to be reversed with 2 units of FFP. Once INR was less than 2 heparin was started. Heparin was subsequently discontinued  prior to GI procedure. Post GI procedure patient was placed back on his Coumadin without a heparin bridge per cardiology recommendations. Patient's INR was therapeutic by day of discharge. Outpatient follow-up.  7. History of alcohol abuse- patient was maintained on thiamine and folic acid. Patient was monitored for withdrawal. Patient was placed on the Ativan withdrawal protocol. 8. Hypokalemia- Repleted. Patient's magnesium was Greater than 2. 9. Acute on chronic thrombocytopenia- stable. Follow.  10. Prognosis--- patient with probable end-stage systolic heart failure with poor ejection fraction and had been seen in consultation by palliative care for goals of care. Goals of care was discussed with the family. Palliative note patient was made a DO NOT RESUSCITATE and recommendations were for home with hospice. Patient's son [name], who is healthcare power of attorney stated that his mother may have agreed to full hospice when patient goes home, without understanding everything and as such wanted to rediscuss it with palliative care, on goals of care. Positive care was to meet with [name], health care power of attorney and family for redo of goals of care prior to discharge.  Procedures: 11. Esophogram 08/11/2014 12. Chest x-ray 08/08/2014, 08/09/2014, 08/11/2014 13. TEE with cardioversion (unsuccessful) 08/10/2014 Jon Croitoru 14. 2 units FFP 08/16/2014 15. EGD with botox injection 08/18/14--Jon Ganem 16.   Consultations: 17. Cardiology: Jon Haroldine Laws 08/09/2014 18. Gastroenterology: Jon. Cristina Gong 08/11/2014  19. Palliative care: Imelda Pillow, NP 08/13/2014 20.   Discharge Exam: Filed Vitals:   08/21/14 0508  BP: 110/67  Pulse: 58  Temp: 97.9 F (36.6 C)  Resp: 18    General: NAD Cardiovascular: Irregularly irregular. Crisp click Respiratory: CTAB  Discharge Instructions  Discharge Instructions    Diet - low sodium heart healthy    Complete by:  As directed      Discharge  instructions    Complete by:  As directed   Follow up with Jon Haroldine Laws in heart failure clinic in 2 weeks. Follow up with [name], MD in 2 weeks.     Increase activity slowly    Complete by:  As directed           Current Discharge Medication List    START taking these medications   Details  LORazepam (ATIVAN) 0.5 MG tablet Take 1 tablet (0.5 mg total) by mouth every 8 (eight) hours as needed for anxiety. Qty: 20 tablet, Refills: 0      CONTINUE these medications which have NOT CHANGED   Details  allopurinol (ZYLOPRIM) 300 MG tablet Take 300 mg by mouth daily.     aspirin EC 81 MG tablet Take 1 tablet (81 mg total) by mouth daily.    carvedilol (COREG) 3.125 MG tablet Take 2 tablets (6.25 mg total) by mouth 2 (two) times daily with a meal. Qty: 120 tablet, Refills: 1   Associated Diagnoses: Chronic systolic heart failure    Coenzyme Q10 (CO Q-10 PO) Take 1 tablet by mouth daily.     colchicine 0.6 MG tablet Take 0.6 mg by mouth daily as needed (gout pain).     donepezil (ARICEPT) 23 MG TABS tablet Take 1 tablet (23 mg total) by mouth at bedtime. Qty: 30 tablet, Refills: 6    fluticasone (FLONASE) 50 MCG/ACT nasal spray Place 2 sprays into both nostrils daily as needed for allergies or rhinitis.     furosemide (LASIX) 40 MG tablet Take 1 tablet (40 mg total) by mouth daily. Qty: 30 tablet    lisinopril (PRINIVIL,ZESTRIL) 10 MG tablet Take 0.5 tablets (5 mg total) by mouth at bedtime. Qty: 30 tablet, Refills: 4    memantine (NAMENDA XR) 28 MG CP24 24 hr capsule Take 1 capsule (28 mg total) by mouth daily. Qty: 30 capsule, Refills: 6    metolazone (ZAROXOLYN) 2.5 MG tablet Take 1 tablet (2.5 mg total) by mouth daily. Qty: 10 tablet, Refills: 3    Multiple Vitamins-Minerals (MULTIVITAMIN WITH MINERALS) tablet Take 1 tablet by mouth daily.     potassium chloride SA (K-DUR,KLOR-CON) 20 MEQ tablet Take 1 tablet (20 mEq total) by mouth daily. Take 1 tab when you take  Metolazone Qty: 10 tablet, Refills: 3    rosuvastatin (CRESTOR) 10 MG tablet Take 5 mg by mouth daily.     spironolactone (ALDACTONE) 25 MG tablet Take 0.5 tablets (12.5 mg total) by mouth daily. Qty: 30 tablet, Refills: 3    thiamine (VITAMIN B-1) 100 MG tablet Take 1 tablet (100 mg total) by mouth 2 (two) times daily. Qty: 60 tablet, Refills: 6    traZODone (DESYREL) 100 MG tablet Take 400 mg by mouth at bedtime. Refills: 2    warfarin (COUMADIN) 2 MG tablet Take as directed by coumadin clinic Qty: 90 tablet, Refills: 1      STOP taking these medications     amiodarone (PACERONE) 400 MG tablet      amoxicillin (AMOXIL) 500 MG capsule        No Known Allergies Follow-up Information    Follow up with [facility].   Why:  [facility]   Contact information:   [address] [address] [phone]  Follow up with [name], MD. Schedule an appointment as soon as possible for a visit in 2 weeks.   Specialty:  Internal Medicine   Contact information:   [address]  [place] [address] [phone]       Follow up with Glori Bickers, MD. Schedule an appointment as soon as possible for a visit in 2 weeks.   Specialty:  Cardiology   Contact information:   [address] [place] [address] [phone]        The results of significant diagnostics from this hospitalization (including imaging, microbiology, ancillary and laboratory) are listed below for reference.    Significant Diagnostic Studies: Dg Chest 2 View  08/08/2014   CLINICAL DATA:  Acute onset of shortness of breath and decreased O2 saturation. Initial encounter.  EXAM: CHEST  2 VIEW  COMPARISON:  Chest radiograph performed 03/11/2013  FINDINGS: Diffuse bibasilar airspace opacification, right greater than left, raises concern for multifocal pneumonia. Small bilateral  pleural effusions are suspected. No pneumothorax is seen.  The heart is borderline enlarged. The patient is status post median sternotomy. A pacemaker/AICD is noted at the left chest wall, with leads ending at the right atrium, right ventricle and coronary sinus. No acute osseous abnormalities are seen.  IMPRESSION: Diffuse bibasilar airspace opacification, right greater than left, raises concern for multifocal pneumonia. Small bilateral pleural effusions suspected. Borderline cardiomegaly.   Electronically Signed   By: Garald Balding M.D.   On: 08/08/2014 19:19   Dg Esophagus  08/11/2014   CLINICAL DATA:  Esophageal dysmotility identified on modified barium swallow.  EXAM: ESOPHOGRAM/BARIUM SWALLOW  TECHNIQUE: Combined double contrast and single contrast examination performed using thin liquid barium.  FLUOROSCOPY TIME:  Radiation Exposure Index (as provided by the fluoroscopic device):  If the device does not provide the exposure index:  Fluoroscopy Time:  1 minutes 35 seconds  Number of Acquired Images:  91  COMPARISON:  CT 10/20/2012  FINDINGS: Exam is limited due to patient immobility. Oral contrast was administered by mouth to the patient in a supine in 30 degree position. Contrast flowed into the mildly distended esophagus. Contrast was restricted at the distal esophagus. Only a thin stream of contrast flowed through the distal esophagus. Majority of the oral contrast remains within the esophagus which is mildly tortuous. There is no retained food stuff within the esophagus.  IMPRESSION: 1. No significant flow of contrast through the distal esophagus. Only a thin string of contrast flowed through to the stomach. There is abrupt narrowing of the distal esophagus. There is no food stuff within the distal esophagus. Findings suggest distal esophageal spasm. Cannot exclude esophageal mass however less likely with no retained food. Consider endoscopy.   Electronically Signed   By: Suzy Bouchard M.D.   On:  08/11/2014 10:43   Dg Chest Port 1 View  08/10/2014   CLINICAL DATA:  Pleural effusion.  EXAM: PORTABLE CHEST - 1 VIEW  COMPARISON:  08/08/2014  FINDINGS: Sequelae of prior median sternotomy are again identified. Pacemaker/ICD remains in place. Cardiac silhouette is mildly enlarged. Thoracic aortic calcification is noted. Bibasilar airspace opacities remain, slightly improved on the right and unchanged on the left compared to the prior study. No new airspace consolidation, sizeable pleural effusion, or pneumothorax is identified.  IMPRESSION: Persistent bibasilar infiltrates, slightly improved on the right in the interim.   Electronically Signed   By: Logan Bores   On: 08/10/2014 07:57   Dg Chest Port 1v Same  Day  08/11/2014   CLINICAL DATA:  Congestive heart failure/cardiomyopathy  EXAM: PORTABLE CHEST - 1 VIEW SAME DAY  COMPARISON:  August 10, 2014  FINDINGS: There is patchy bibasilar atelectatic type change. There is trace interstitial edema, marginally less than on the study from 1 day prior. No new opacity. Heart is slightly enlarged with pulmonary vascularity within normal limits. Pacemaker leads are attached to the right atrium, right ventricle, and left ventricle. There is atherosclerotic change in aorta. No adenopathy. No pneumothorax.  IMPRESSION: Trace interstitial edema remains, slightly less than 1 day prior. There is patchy bibasilar atelectasis. No new opacity. Heart prominent but stable. Suspect mild residual congestive heart failure.   Electronically Signed   By: Lowella Grip III M.D.   On: 08/11/2014 07:44   Dg Swallowing Func-speech Pathology  08/09/2014    Objective Swallowing Evaluation:    Patient Details  Name: Jon Butler MRN: [id number] Date of Birth: March 11, 1935  Today's Date: 08/09/2014 Time: SLP Start Time (ACUTE ONLY): 1310-SLP Stop Time (ACUTE ONLY): 1337 SLP Time Calculation (min) (ACUTE ONLY): 27 min  Past Medical History:  Past Medical History  Diagnosis Date  . Left  bundle-branch block   . Cardiomyopathy   . Chronic systolic heart failure   . Atrial fibrillation    Past Surgical History:  Past Surgical History  Procedure Laterality Date  . Doppler echocardiography  2008, 2009  . Aortic valve replacement    . Tee without cardioversion N/A 10/15/2012    Procedure: TRANSESOPHAGEAL ECHOCARDIOGRAM (TEE);  Surgeon: Fay Records,  MD;  Location: [facility] ENDOSCOPY;  Service: Cardiovascular;  Laterality: N/A;  . Cardioversion N/A 10/25/2012    Procedure: CARDIOVERSION;  Surgeon: Evans Lance, MD;  Location: [place];  Service: Cardiovascular;  Laterality: N/A;  . Cardioversion N/A 12/20/2012    Procedure: CARDIOVERSION;  Surgeon: Larey Dresser, MD;  Location: [place];  Service: Cardiovascular;  Laterality: N/A;  . Implantable cardioverter defibrillator generator change N/A 09/20/2012    Procedure: IMPLANTABLE CARDIOVERTER DEFIBRILLATOR GENERATOR CHANGE;   Surgeon: Evans Lance, MD;  Location: [facility] CATH LAB;  Service:  Cardiovascular;  Laterality: N/A;   HPI:  Other Pertinent Information: JOHNEY PEROTTI is a 79 y.o. male with Past  medical history of left bundle-branch block, chronic systolic CHF, LVEF  97%, aortic replacement with mechanical valve, chronic anticoagulation,  atrial fibrillation, alcohol use.The patient is presenting with complaints  of shortness of breath cough as well as chills. Chest x-ray shows  multifocal pneumonia. He has leukocytosis as well as hypoxia and low-grade  temperature.  Pt with a vague history of esophageal dysphagia.   No Data Recorded  Assessment / Plan / Recommendation CHL IP CLINICAL IMPRESSIONS 08/09/2014  Therapy Diagnosis Suspected primary esophageal dysphagia  Clinical Impression Pt presents with a likely primary esophageal  dysphagia.  Oropharyngeal swallow was functional with mild hypopharyngeal  residue noted with all consistencies; trace penetration thin liquids noted  on one occasion.  Esophageal screen revealed presence of barium   accumulating in esophagus - regardless of consistency -and eventually  filling entire column; back flow noted.  Stasis remained through course of  study.  Recommend  esophageal w/u to elucidate source of deficit  For now,  continue POs with esophageal strategies. D/W Jon. Broadus John, pt/wife.         CHL IP TREATMENT RECOMMENDATION 08/09/2014  Treatment Recommendations Therapy as outlined in treatment plan below     CHL IP DIET RECOMMENDATION 08/09/2014  SLP Diet Recommendations Age appropriate regular solids;Thin  Liquid Administration via (None)  Medication Administration Crushed with puree  Compensations Slow rate;Small sips/bites  Postural Changes and/or Swallow Maneuvers (None)     CHL IP OTHER RECOMMENDATIONS 08/09/2014  Recommended Consults Consider GI evaluation;Consider esophageal assessment   Oral Care Recommendations Oral care BID  Other Recommendations (None)     No flowsheet data found.          SLP Swallow Goals No flowsheet data found.  No flowsheet data found.    CHL IP REASON FOR REFERRAL 08/09/2014  Reason for Referral Objectively evaluate swallowing function     CHL IP ORAL PHASE 08/09/2014  Lips (None)  Tongue (None)  Mucous membranes (None)  Nutritional status (None)  Other (None)  Oxygen therapy (None)  Oral Phase WFL  Oral - Pudding Teaspoon (None)  Oral - Pudding Cup (None)  Oral - Honey Teaspoon (None)  Oral - Honey Cup (None)  Oral - Honey Syringe (None)  Oral - Nectar Teaspoon (None)  Oral - Nectar Cup (None)  Oral - Nectar Straw (None)  Oral - Nectar Syringe (None)  Oral - Ice Chips (None)  Oral - Thin Teaspoon (None)  Oral - Thin Cup (None)  Oral - Thin Straw (None)  Oral - Thin Syringe (None)  Oral - Puree (None)  Oral - Mechanical Soft (None)  Oral - Regular (None)  Oral - Multi-consistency (None)  Oral - Pill (None)  Oral Phase - Comment (None)      CHL IP PHARYNGEAL PHASE 08/09/2014  Pharyngeal Phase Impaired  Pharyngeal - Pudding Teaspoon (None)  Penetration/Aspiration details (pudding teaspoon)  (None)  Pharyngeal - Pudding Cup (None)  Penetration/Aspiration details (pudding cup) (None)  Pharyngeal - Honey Teaspoon (None)  Penetration/Aspiration details (honey teaspoon) (None)  Pharyngeal - Honey Cup (None)  Penetration/Aspiration details (honey cup) (None)  Pharyngeal - Honey Syringe (None)  Penetration/Aspiration details (honey syringe) (None)  Pharyngeal - Nectar Teaspoon (None)  Penetration/Aspiration details (nectar teaspoon) (None)  Pharyngeal - Nectar Cup (None)  Penetration/Aspiration details (nectar cup) (None)  Pharyngeal - Nectar Straw (None)  Penetration/Aspiration details (nectar straw) (None)  Pharyngeal - Nectar Syringe (None)  Penetration/Aspiration details (nectar syringe) (None)  Pharyngeal - Ice Chips (None)  Penetration/Aspiration details (ice chips) (None)  Pharyngeal - Thin Teaspoon (None)  Penetration/Aspiration details (thin teaspoon) (None)  Pharyngeal - Thin Cup (None)  Penetration/Aspiration details (thin cup) (None)  Pharyngeal - Thin Straw (None)  Penetration/Aspiration details (thin straw) (None)  Pharyngeal - Thin Syringe (None)  Penetration/Aspiration details (thin syringe') (None)  Pharyngeal - Puree (None)  Penetration/Aspiration details (puree) (None)  Pharyngeal - Mechanical Soft (None)  Penetration/Aspiration details (mechanical soft) (None)  Pharyngeal - Regular (None)  Penetration/Aspiration details (regular) (None)  Pharyngeal - Multi-consistency (None)  Penetration/Aspiration details (multi-consistency) (None)  Pharyngeal - Pill (None)  Penetration/Aspiration details (pill) (None)  Pharyngeal Comment (None)      CHL IP CERVICAL ESOPHAGEAL PHASE 08/09/2014  Cervical Esophageal Phase Impaired  Pudding Teaspoon (None)  Pudding Cup (None)  Honey Teaspoon (None)  Honey Cup (None)  Honey Straw (None)  Nectar Teaspoon (None)  Nectar Cup (None)  Nectar Straw (None)  Nectar Sippy Cup (None)  Thin Teaspoon (None)  Thin Cup (None)  Thin Straw (None)  Thin Sippy Cup (None)   Cervical Esophageal Comment see clinical impressions    No flowsheet data found.         Jon Butler 08/09/2014, 2:00 PM     Microbiology: No results  found for this or any previous visit (from the past 240 hour(s)).   Labs: Basic Metabolic Panel:  Recent Labs Lab 08/15/14 0250 08/16/14 0510 08/17/14 0707 08/18/14 0405 08/19/14 0029 08/20/14 0456  NA 136 137 136 137 135 137  K 4.0 4.2 4.0 3.9 3.7 4.1  CL 94* 96* 94* 95* 97* 96*  CO2 33* 33* 33* 34* 29 34*  GLUCOSE 98 85 91 83 108* 103*  BUN 13 15 14 12 11 9   CREATININE 0.87 0.86 0.86 0.93 0.85 1.00  CALCIUM 8.2* 8.5* 8.7* 8.6* 8.4* 8.4*  MG 2.0  --   --   --   --   --    Liver Function Tests: No results for input(s): AST, ALT, ALKPHOS, BILITOT, PROT, ALBUMIN in the last 168 hours. No results for input(s): LIPASE, AMYLASE in the last 168 hours. No results for input(s): AMMONIA in the last 168 hours. CBC:  Recent Labs Lab 08/15/14 0250 08/17/14 0707 08/18/14 0405 08/19/14 0029 08/20/14 0456  WBC 6.5 6.4 5.1 5.4 5.5  HGB 12.4* 12.8* 12.5* 12.2* 12.3*  HCT 38.1* 39.7 38.4* 37.2* 38.5*  MCV 95.0 95.9 94.6 94.7 97.0  PLT 125* 125* 124* 115* 115*   Cardiac Enzymes: No results for input(s): CKTOTAL, CKMB, CKMBINDEX, TROPONINI in the last 168 hours. BNP: BNP (last 3 results)  Recent Labs  08/09/14 0745  BNP 2479.2*    ProBNP (last 3 results) No results for input(s): PROBNP in the last 8760 hours.  CBG: No results for input(s): GLUCAP in the last 168 hours.     SignedIrine Seal MD [facility] 08/21/2014, 12:19 PM

## 2014-08-21 NOTE — Progress Notes (Addendum)
Palliative Care Follow-up Note  Received call from Mr. Cuff son Sanjuan Sawa wanting to discuss his fathers "DNR" order. Mr. Casebolt has discharge orders and a complete palliative care consult was done on 8/7 where the concept of DNR was discussed and referral for hospice to maximize in home care options-recommendation for hospice was based on his probable eligibility for hospice care (EF 20%, weight loss and goals). Wife and patient were agreeable.  Summary of conversation:  Patient's son expressed that he was upset that no one had discussed with him DNR and that his mom did not understand what DNR meant and was specifically requesting a "checklist" - I attempted without success to determine what he meant by a check list and what his understanding of DNR was and what that meant to him. It was clear that he was extremely confused and frustrated-He had a stressed tone. At one point he asked if I was laughing which I found very unusual. I offered to meet him in person at [facility] today but he said he wanted to get his father home- he then requested to see me tomorrow- but since his father will not be inpatient I am unable to meet this request.  I offered home palliative referral but he said "we are not even there  yet- Im just talking about DNR". He tells me he is the one who had his father's defibrillator turned off and had a discussion with Dr. Lovena Le who agred with that plan. Reason stated was that he wanted his father to die peacefully. I asked what his concern was his father having a DNR- and he said that it was all the things that could take his dad's life other than cardiac arrest. He also stated that his mother did not understand the conversation that was had with our provider on 8/7. I sensed a high level of conflict or anger/stress. Again I expressed my willingness to want to help him but when I asked him to tell me what his understanding of DNR meant - he would not discuss this or answer my question  therefore I could not tell where he was coming from.  I then asked him what his expectations were moving forward with his dads care- again he did not know. I apologized for not being able to help him fully understand his questions and for my inability to understand exactly what he was asking. I explained my professional recommendation that if someone elects to have a defibrillator to be turned off- to allow for a natural death should that occur-should most definitely want a home DNR order in place- I simply could not understand that disconnect-and I explained to him that DNR does not mean do not treat. At that point [facility] exasperatedly hung up the phone on me.   Plan:  Suspect son [name] is stressed and feeling overwhelmed- he needs a "checklist" so I will try to get some information together for him about code status etc..and send it by mail. These issues are rarely black and white but I will at least send a formal definition of DNR. I briefly tried to explain the difference between palliative care/hospice unsuccessfully. Very difficult communication occurred. I will copy Dr. Lovena Le on this note- they may do well with a conversation from a trusted provider- DNR is medically appropriate and seems to be well with in Mr. Holloway goals of care.   Plan is that he is going home with Home Health-unfortunately given confusion will not place hospice referral. High  risk for re-admission or a repeat hospitalization. Place [facility] referral.  Time: 1:25-2PM Total: 35 minutes Greater than 50%  of this time was spent counseling and coordinating care related to the above assessment and plan.    Lane Hacker, DO Palliative Medicine [phone]

## 2014-08-21 NOTE — Anesthesia Postprocedure Evaluation (Signed)
  Anesthesia Post-op Note  Patient: Jon Butler  Procedure(s) Performed: Procedure(s): ESOPHAGOGASTRODUODENOSCOPY (EGD) WITH PROPOFOL (N/A) BOTOX INJECTION (N/A)  Patient Location: Endoscopy Unit  Anesthesia Type:MAC  Level of Consciousness: awake and alert   Airway and Oxygen Therapy: Patient Spontanous Breathing  Post-op Pain: none  Post-op Assessment: Post-op Vital signs reviewed and Patient's Cardiovascular Status Stable LLE Motor Response: Purposeful movement, Responds to commands   RLE Motor Response: Purposeful movement, Responds to commands        Post-op Vital Signs: Reviewed and stable  Last Vitals:  Filed Vitals:   08/21/14 0508  BP: 110/67  Pulse: 58  Temp: 36.6 C  Resp: 18    Complications: No apparent anesthesia complications

## 2014-08-22 NOTE — Progress Notes (Signed)
NCM did follow up with [place] rep in regarding insurance. States she did send insurance info to start [facility] at home. [name] RN CCM Case Mgmt phone [phone]

## 2014-08-23 DIAGNOSIS — I5032 Chronic diastolic (congestive) heart failure: Secondary | ICD-10-CM | POA: Diagnosis not present

## 2014-08-23 DIAGNOSIS — R419 Unspecified symptoms and signs involving cognitive functions and awareness: Secondary | ICD-10-CM | POA: Diagnosis not present

## 2014-08-23 DIAGNOSIS — R1310 Dysphagia, unspecified: Secondary | ICD-10-CM | POA: Diagnosis not present

## 2014-08-23 DIAGNOSIS — I472 Ventricular tachycardia: Secondary | ICD-10-CM | POA: Diagnosis not present

## 2014-08-23 DIAGNOSIS — I4891 Unspecified atrial fibrillation: Secondary | ICD-10-CM | POA: Diagnosis not present

## 2014-08-23 DIAGNOSIS — I5023 Acute on chronic systolic (congestive) heart failure: Secondary | ICD-10-CM | POA: Diagnosis not present

## 2014-08-23 DIAGNOSIS — K220 Achalasia of cardia: Secondary | ICD-10-CM | POA: Diagnosis not present

## 2014-08-25 DIAGNOSIS — I4891 Unspecified atrial fibrillation: Secondary | ICD-10-CM | POA: Diagnosis not present

## 2014-08-25 DIAGNOSIS — R1310 Dysphagia, unspecified: Secondary | ICD-10-CM | POA: Diagnosis not present

## 2014-08-25 DIAGNOSIS — R419 Unspecified symptoms and signs involving cognitive functions and awareness: Secondary | ICD-10-CM | POA: Diagnosis not present

## 2014-08-25 DIAGNOSIS — I5023 Acute on chronic systolic (congestive) heart failure: Secondary | ICD-10-CM | POA: Diagnosis not present

## 2014-08-25 DIAGNOSIS — K220 Achalasia of cardia: Secondary | ICD-10-CM | POA: Diagnosis not present

## 2014-08-25 DIAGNOSIS — I472 Ventricular tachycardia: Secondary | ICD-10-CM | POA: Diagnosis not present

## 2014-08-28 DIAGNOSIS — I472 Ventricular tachycardia: Secondary | ICD-10-CM | POA: Diagnosis not present

## 2014-08-28 DIAGNOSIS — K220 Achalasia of cardia: Secondary | ICD-10-CM | POA: Diagnosis not present

## 2014-08-28 DIAGNOSIS — I5023 Acute on chronic systolic (congestive) heart failure: Secondary | ICD-10-CM | POA: Diagnosis not present

## 2014-08-28 DIAGNOSIS — R419 Unspecified symptoms and signs involving cognitive functions and awareness: Secondary | ICD-10-CM | POA: Diagnosis not present

## 2014-08-28 DIAGNOSIS — I4891 Unspecified atrial fibrillation: Secondary | ICD-10-CM | POA: Diagnosis not present

## 2014-08-28 DIAGNOSIS — R1310 Dysphagia, unspecified: Secondary | ICD-10-CM | POA: Diagnosis not present

## 2014-08-29 ENCOUNTER — Ambulatory Visit (INDEPENDENT_AMBULATORY_CARE_PROVIDER_SITE_OTHER): Payer: Medicare Other | Admitting: Pharmacist

## 2014-08-29 DIAGNOSIS — R419 Unspecified symptoms and signs involving cognitive functions and awareness: Secondary | ICD-10-CM | POA: Diagnosis not present

## 2014-08-29 DIAGNOSIS — I4891 Unspecified atrial fibrillation: Secondary | ICD-10-CM | POA: Diagnosis not present

## 2014-08-29 DIAGNOSIS — I5023 Acute on chronic systolic (congestive) heart failure: Secondary | ICD-10-CM | POA: Diagnosis not present

## 2014-08-29 DIAGNOSIS — K220 Achalasia of cardia: Secondary | ICD-10-CM | POA: Diagnosis not present

## 2014-08-29 DIAGNOSIS — I472 Ventricular tachycardia: Secondary | ICD-10-CM | POA: Diagnosis not present

## 2014-08-29 DIAGNOSIS — Z5181 Encounter for therapeutic drug level monitoring: Secondary | ICD-10-CM

## 2014-08-29 DIAGNOSIS — I480 Paroxysmal atrial fibrillation: Secondary | ICD-10-CM

## 2014-08-29 DIAGNOSIS — Z95810 Presence of automatic (implantable) cardiac defibrillator: Secondary | ICD-10-CM

## 2014-08-29 DIAGNOSIS — R1310 Dysphagia, unspecified: Secondary | ICD-10-CM | POA: Diagnosis not present

## 2014-08-29 LAB — POCT INR: INR: 2.2

## 2014-08-31 DIAGNOSIS — R1310 Dysphagia, unspecified: Secondary | ICD-10-CM | POA: Diagnosis not present

## 2014-08-31 DIAGNOSIS — K220 Achalasia of cardia: Secondary | ICD-10-CM | POA: Diagnosis not present

## 2014-08-31 DIAGNOSIS — R419 Unspecified symptoms and signs involving cognitive functions and awareness: Secondary | ICD-10-CM | POA: Diagnosis not present

## 2014-08-31 DIAGNOSIS — I472 Ventricular tachycardia: Secondary | ICD-10-CM | POA: Diagnosis not present

## 2014-08-31 DIAGNOSIS — I4891 Unspecified atrial fibrillation: Secondary | ICD-10-CM | POA: Diagnosis not present

## 2014-08-31 DIAGNOSIS — I5023 Acute on chronic systolic (congestive) heart failure: Secondary | ICD-10-CM | POA: Diagnosis not present

## 2014-09-01 DIAGNOSIS — R1310 Dysphagia, unspecified: Secondary | ICD-10-CM | POA: Diagnosis not present

## 2014-09-01 DIAGNOSIS — I472 Ventricular tachycardia: Secondary | ICD-10-CM | POA: Diagnosis not present

## 2014-09-01 DIAGNOSIS — R419 Unspecified symptoms and signs involving cognitive functions and awareness: Secondary | ICD-10-CM | POA: Diagnosis not present

## 2014-09-01 DIAGNOSIS — K220 Achalasia of cardia: Secondary | ICD-10-CM | POA: Diagnosis not present

## 2014-09-01 DIAGNOSIS — I4891 Unspecified atrial fibrillation: Secondary | ICD-10-CM | POA: Diagnosis not present

## 2014-09-01 DIAGNOSIS — I5023 Acute on chronic systolic (congestive) heart failure: Secondary | ICD-10-CM | POA: Diagnosis not present

## 2014-09-04 DIAGNOSIS — I472 Ventricular tachycardia: Secondary | ICD-10-CM | POA: Diagnosis not present

## 2014-09-04 DIAGNOSIS — I5023 Acute on chronic systolic (congestive) heart failure: Secondary | ICD-10-CM | POA: Diagnosis not present

## 2014-09-04 DIAGNOSIS — I4891 Unspecified atrial fibrillation: Secondary | ICD-10-CM | POA: Diagnosis not present

## 2014-09-04 DIAGNOSIS — R1310 Dysphagia, unspecified: Secondary | ICD-10-CM | POA: Diagnosis not present

## 2014-09-04 DIAGNOSIS — K220 Achalasia of cardia: Secondary | ICD-10-CM | POA: Diagnosis not present

## 2014-09-04 DIAGNOSIS — R419 Unspecified symptoms and signs involving cognitive functions and awareness: Secondary | ICD-10-CM | POA: Diagnosis not present

## 2014-09-05 ENCOUNTER — Encounter: Payer: Self-pay | Admitting: Internal Medicine

## 2014-09-05 ENCOUNTER — Ambulatory Visit (INDEPENDENT_AMBULATORY_CARE_PROVIDER_SITE_OTHER): Payer: Medicare Other | Admitting: *Deleted

## 2014-09-05 DIAGNOSIS — I4891 Unspecified atrial fibrillation: Secondary | ICD-10-CM | POA: Diagnosis not present

## 2014-09-05 DIAGNOSIS — I429 Cardiomyopathy, unspecified: Secondary | ICD-10-CM

## 2014-09-05 DIAGNOSIS — K220 Achalasia of cardia: Secondary | ICD-10-CM | POA: Diagnosis not present

## 2014-09-05 DIAGNOSIS — I5022 Chronic systolic (congestive) heart failure: Secondary | ICD-10-CM | POA: Diagnosis not present

## 2014-09-05 DIAGNOSIS — R1310 Dysphagia, unspecified: Secondary | ICD-10-CM | POA: Diagnosis not present

## 2014-09-05 DIAGNOSIS — I472 Ventricular tachycardia: Secondary | ICD-10-CM | POA: Diagnosis not present

## 2014-09-05 DIAGNOSIS — R419 Unspecified symptoms and signs involving cognitive functions and awareness: Secondary | ICD-10-CM | POA: Diagnosis not present

## 2014-09-05 DIAGNOSIS — I5023 Acute on chronic systolic (congestive) heart failure: Secondary | ICD-10-CM | POA: Diagnosis not present

## 2014-09-05 NOTE — Progress Notes (Signed)
Remote ICD transmission.   

## 2014-09-06 DIAGNOSIS — R419 Unspecified symptoms and signs involving cognitive functions and awareness: Secondary | ICD-10-CM | POA: Diagnosis not present

## 2014-09-06 DIAGNOSIS — I5023 Acute on chronic systolic (congestive) heart failure: Secondary | ICD-10-CM | POA: Diagnosis not present

## 2014-09-06 DIAGNOSIS — R1310 Dysphagia, unspecified: Secondary | ICD-10-CM | POA: Diagnosis not present

## 2014-09-06 DIAGNOSIS — K220 Achalasia of cardia: Secondary | ICD-10-CM | POA: Diagnosis not present

## 2014-09-06 DIAGNOSIS — I472 Ventricular tachycardia: Secondary | ICD-10-CM | POA: Diagnosis not present

## 2014-09-06 DIAGNOSIS — I4891 Unspecified atrial fibrillation: Secondary | ICD-10-CM | POA: Diagnosis not present

## 2014-09-07 ENCOUNTER — Ambulatory Visit (HOSPITAL_COMMUNITY)
Admission: RE | Admit: 2014-09-07 | Discharge: 2014-09-07 | Disposition: A | Discharge status: Home / Self Care | Payer: Medicare Other | Source: Ambulatory Visit | Attending: Internal Medicine | Admitting: Internal Medicine

## 2014-09-07 ENCOUNTER — Encounter (HOSPITAL_COMMUNITY): Payer: Self-pay

## 2014-09-07 VITALS — BP 106/64 | HR 86 | Wt 196.5 lb

## 2014-09-07 DIAGNOSIS — Z7901 Long term (current) use of anticoagulants: Secondary | ICD-10-CM | POA: Diagnosis not present

## 2014-09-07 DIAGNOSIS — I5022 Chronic systolic (congestive) heart failure: Secondary | ICD-10-CM | POA: Diagnosis not present

## 2014-09-07 DIAGNOSIS — N183 Chronic kidney disease, stage 3 (moderate): Secondary | ICD-10-CM | POA: Diagnosis not present

## 2014-09-07 DIAGNOSIS — I429 Cardiomyopathy, unspecified: Secondary | ICD-10-CM | POA: Insufficient documentation

## 2014-09-07 DIAGNOSIS — Z95810 Presence of automatic (implantable) cardiac defibrillator: Secondary | ICD-10-CM | POA: Insufficient documentation

## 2014-09-07 DIAGNOSIS — Z79899 Other long term (current) drug therapy: Secondary | ICD-10-CM | POA: Insufficient documentation

## 2014-09-07 DIAGNOSIS — I4891 Unspecified atrial fibrillation: Secondary | ICD-10-CM | POA: Insufficient documentation

## 2014-09-07 DIAGNOSIS — Z952 Presence of prosthetic heart valve: Secondary | ICD-10-CM | POA: Diagnosis not present

## 2014-09-07 DIAGNOSIS — R413 Other amnesia: Secondary | ICD-10-CM | POA: Insufficient documentation

## 2014-09-07 DIAGNOSIS — Z7982 Long term (current) use of aspirin: Secondary | ICD-10-CM | POA: Diagnosis not present

## 2014-09-07 LAB — CBC
HEMATOCRIT: 39.3 % (ref 39.0–52.0)
Hemoglobin: 12.6 g/dL — ABNORMAL LOW (ref 13.0–17.0)
MCH: 31 pg (ref 26.0–34.0)
MCHC: 32.1 g/dL (ref 30.0–36.0)
MCV: 96.8 fL (ref 78.0–100.0)
PLATELETS: 86 10*3/uL — AB (ref 150–400)
RBC: 4.06 MIL/uL — ABNORMAL LOW (ref 4.22–5.81)
RDW: 17.3 % — AB (ref 11.5–15.5)
WBC: 5.7 10*3/uL (ref 4.0–10.5)

## 2014-09-07 LAB — BASIC METABOLIC PANEL
Anion gap: 5 (ref 5–15)
BUN: 19 mg/dL (ref 6–20)
CHLORIDE: 106 mmol/L (ref 101–111)
CO2: 29 mmol/L (ref 22–32)
CREATININE: 1 mg/dL (ref 0.61–1.24)
Calcium: 8.5 mg/dL — ABNORMAL LOW (ref 8.9–10.3)
GFR calc Af Amer: >60 mL/min (ref >60)
GLUCOSE: 123 mg/dL — AB (ref 65–99)
POTASSIUM: 4 mmol/L (ref 3.5–5.1)
SODIUM: 140 mmol/L (ref 135–145)

## 2014-09-07 LAB — PROTIME-INR
INR: 1.66 — ABNORMAL HIGH (ref 0.00–1.49)
Prothrombin Time: 19.6 seconds — ABNORMAL HIGH (ref 11.6–15.2)

## 2014-09-07 MED ORDER — POTASSIUM CHLORIDE CRYS ER 20 MEQ PO TBCR: 20.0000 meq | EXTENDED_RELEASE_TABLET | Freq: Every day | ORAL | Status: DC

## 2014-09-07 MED ORDER — FUROSEMIDE 40 MG PO TABS: 40.0000 mg | ORAL_TABLET | Freq: Two times a day (BID) | ORAL | Status: DC

## 2014-09-07 MED ORDER — AMIODARONE HCL 200 MG PO TABS: 200.0000 mg | ORAL_TABLET | Freq: Every day | ORAL | Status: DC

## 2014-09-07 MED ORDER — POTASSIUM CHLORIDE CRYS ER 20 MEQ PO TBCR: 20.0000 meq | EXTENDED_RELEASE_TABLET | Freq: Two times a day (BID) | ORAL | Status: DC

## 2014-09-07 NOTE — Progress Notes (Signed)
Patient ID: Jon Butler, male   DOB: 1935/01/25, 79 y.o.   MRN: [id number] PCP: Dr Lysle Rubens EP: Dr Lovena Le [facility] HF: Bensimhon  HPI: Jon Butler is a 79 yo male with a history of chronic systolic HF EF 36% due to NICM s/p CRT-D (Medtronic), mechanical AVR (St Jude), LBBB, PAF S/P DC-CV 12/20/12, ETOH abuse and dementia and non-compliance.   Admitted to [facility] 10/10 through 10/26/12 with ADHF in setting of recurrent AF. Developed cardiogenic shock and VDRF   BB and ACE-I held and amiodarone gtt was started. Required short term Milrinone and lasix.Diuresed 26 pounds and transitioned to 40 mg daily. DC-CV on 10/20. Remains on Coumadin. D/C weight 206 pounds.   S/P successful DC-CV 12/20/12. Amio increased to 400 bid.   Has seen Dr. Sherron Flemings in Neurology for progressive cognitive decline in setting of ETOH abuse. Also had foot drop.   Admitted in 8/16 for respiratory failure due to PNA and HF in setting of recurrent AF. Underwent TEE/DC-CV with nearly immediate reversion to AF.Seen by EP and felt to have end-stage HF and rate control strategy recommended. Amio stopped. Also underwent botox injection in esophagus for esophageal spasm/dysphagia. Made DNR and ICD deactivated. Weight at home 193-194 (was discharged at 191 pound)   Returns for HF f/u. Here with son and his wife. Says he is doing OK. Denies significant dyspnea or edema. Functional status limited no getting out of house. Chronic orthopnea. No PND. Swallowing much better  11/01/12 Labs INR > 8 10/26/12 K 3.0 Creatinine 1.23  12/16/12 K 4.6 Creatinine 1.05 12/20/12 K 4.0 Creatinine 1.04 11/15      K 4.4 Creatinine 1.1 05/26/2014: K 4.3 Creatinine 0.94  06/29/14: INR 1.9   ICD interrogation : Optivol markedly over threshold. Back in NSR. No VT. Activity level increased slightly - now almost 1hr/day. VPacing improved ReDS vest reading 44%   ROS: All systems negative except as listed in HPI, PMH and Problem List.  Past Medical  History  Diagnosis Date  . Left bundle-branch block   . Cardiomyopathy   . Chronic systolic heart failure   . Atrial fibrillation     Current Outpatient Prescriptions  Medication Sig Dispense Refill  . allopurinol (ZYLOPRIM) 300 MG tablet Take 300 mg by mouth daily.     Marland Kitchen aspirin EC 81 MG tablet Take 1 tablet (81 mg total) by mouth daily.    . carvedilol (COREG) 3.125 MG tablet Take 2 tablets (6.25 mg total) by mouth 2 (two) times daily with a meal. 120 tablet 1  . Coenzyme Q10 (CO Q-10 PO) Take 1 tablet by mouth daily.     . colchicine 0.6 MG tablet Take 0.6 mg by mouth daily as needed (gout pain).     Marland Kitchen donepezil (ARICEPT) 23 MG TABS tablet Take 1 tablet (23 mg total) by mouth at bedtime. 30 tablet 6  . fluticasone (FLONASE) 50 MCG/ACT nasal spray Place 2 sprays into both nostrils daily as needed for allergies or rhinitis.     . furosemide (LASIX) 40 MG tablet Take 1 tablet (40 mg total) by mouth 2 (two) times daily. 60 tablet 3  . lisinopril (PRINIVIL,ZESTRIL) 10 MG tablet Take 0.5 tablets (5 mg total) by mouth at bedtime. 30 tablet 4  . LORazepam (ATIVAN) 0.5 MG tablet Take 1 tablet (0.5 mg total) by mouth every 8 (eight) hours as needed for anxiety. 20 tablet 0  . memantine (NAMENDA XR) 28 MG CP24 24 hr capsule Take 1 capsule (28  mg total) by mouth daily. 30 capsule 6  . metolazone (ZAROXOLYN) 2.5 MG tablet Take 1 tablet (2.5 mg total) by mouth daily. 10 tablet 3  . Multiple Vitamins-Minerals (MULTIVITAMIN WITH MINERALS) tablet Take 1 tablet by mouth daily.     . potassium chloride SA (K-DUR,KLOR-CON) 20 MEQ tablet Take 1 tablet (20 mEq total) by mouth daily. Take 1 extra tab when you take metolazone 30 tablet 3  . rosuvastatin (CRESTOR) 10 MG tablet Take 5 mg by mouth daily.     Marland Kitchen spironolactone (ALDACTONE) 25 MG tablet Take 0.5 tablets (12.5 mg total) by mouth daily. 30 tablet 3  . thiamine (VITAMIN B-1) 100 MG tablet Take 1 tablet (100 mg total) by mouth 2 (two) times daily. 60  tablet 6  . traZODone (DESYREL) 100 MG tablet Take 400 mg by mouth at bedtime.  2  . warfarin (COUMADIN) 2 MG tablet Take as directed by coumadin clinic (Patient taking differently: Take 2-3 mg by mouth daily at 6 PM. Take 2 mg daily except on Tuesday and Saturday. Take 3 mg on Tuesday and Saturday) 90 tablet 1  . amiodarone (PACERONE) 200 MG tablet Take 1 tablet (200 mg total) by mouth daily. 30 tablet 3   No current facility-administered medications for this encounter.   Filed Vitals:   09/07/14 1512  BP: 106/64  Pulse: 86  Weight: 196 lb 8 oz (89.132 kg)  SpO2: 96%   PHYSICAL EXAM: General:  Elderly. No resp difficulty Wife present HEENT: normal Neck: supple. JVP 8-9.  Carotids 2+ bilaterally; no bruits. No lymphadenopathy or thryomegaly appreciated. Cor: PMI normal. Regular rate & rhythm. Mechanical s2 No rubs, gallops 2/6 TR. Lungs: clear Abdomen: soft, nontender, nondistended. No hepatosplenomegaly. No bruits or masses. Good bowel sounds. Extremities: no cyanosis, clubbing, rash, 2+  edema Neuro: alert & orientedx3, cranial nerves grossly intact. Moves all 4 extremities w/o difficulty. Affect pleasant.  ASSESSMENT & PLAN: 1. Chronic Systolic Heart Failure EF 20%  S/P Medtronic CRT-D 09/2012  - he is volume overloaded on exam as well as Optivol and ReDS reading.  - increase lasix to 40 bid and increase KCL. Goal weight 188-190 - NYHA III-IIIB symptoms but seems improved with return of NSR -Continue current meds.  - Reinforced the need and ifmportance of daily weights, a low sodium diet, and fluid restriction (less than 2 L a day). Instructed to call the HF clinic if weight increases more than 3 lbs overnight or 5 lbs in a week.   2. A Fib-  S/P DC-CV 12/20/12.  - failed DCCV in hospital so amio stopped but now back in NSR. I think he does much better in NSR. Will restart amio at 200 daily. Can start Ranexa for synergy as needed.  Contniue coumadin for AF and AVR.  3. CKD stage 3-  Check BMET today.  4. Alcohol- Reinforced alcohol abstinence especially with coumadin.  5. Memory loss-  Neurology following. On namenda and aricept. 6. Mechanical AVR - on coumadin. SBE prophylaxis for all procedures.   Total time spent 40 minutes. Over half that time spent discussing above.   Bensimhon, Daniel,MD 9:44 PM

## 2014-09-07 NOTE — Patient Instructions (Addendum)
Increase Furosemide (Lasix) to 40 mg Twice daily   Increase Potassium to 20 meq daily, take 1 extra tab when you take metolazone  Start Amiodarone 200 mg daily  Start Lorazepam 0.5 mg at bedtime as needed  Labs today  Home Health will draw labs next week  Your physician recommends that you schedule a follow-up appointment in: 2-3 weeks

## 2014-09-07 NOTE — Progress Notes (Signed)
REDS vest score: 44

## 2014-09-08 ENCOUNTER — Ambulatory Visit (INDEPENDENT_AMBULATORY_CARE_PROVIDER_SITE_OTHER): Payer: Medicare Other | Admitting: Pharmacist

## 2014-09-08 ENCOUNTER — Telehealth: Payer: Self-pay | Admitting: *Deleted

## 2014-09-08 DIAGNOSIS — I4891 Unspecified atrial fibrillation: Secondary | ICD-10-CM | POA: Diagnosis not present

## 2014-09-08 DIAGNOSIS — I5023 Acute on chronic systolic (congestive) heart failure: Secondary | ICD-10-CM | POA: Diagnosis not present

## 2014-09-08 DIAGNOSIS — Z95810 Presence of automatic (implantable) cardiac defibrillator: Secondary | ICD-10-CM

## 2014-09-08 DIAGNOSIS — K220 Achalasia of cardia: Secondary | ICD-10-CM | POA: Diagnosis not present

## 2014-09-08 DIAGNOSIS — R1310 Dysphagia, unspecified: Secondary | ICD-10-CM | POA: Diagnosis not present

## 2014-09-08 DIAGNOSIS — Z5181 Encounter for therapeutic drug level monitoring: Secondary | ICD-10-CM

## 2014-09-08 DIAGNOSIS — R419 Unspecified symptoms and signs involving cognitive functions and awareness: Secondary | ICD-10-CM | POA: Diagnosis not present

## 2014-09-08 DIAGNOSIS — I472 Ventricular tachycardia: Secondary | ICD-10-CM | POA: Diagnosis not present

## 2014-09-08 LAB — CUP PACEART REMOTE DEVICE CHECK
Battery Voltage: 2.99 V
Brady Statistic AP VP Percent: 0 %
Brady Statistic AP VS Percent: 0 %
Brady Statistic AS VP Percent: 62.65 %
Brady Statistic RA Percent Paced: 0 %
Brady Statistic RV Percent Paced: 67.91 %
HighPow Impedance: 41 Ohm
HighPow Impedance: 51 Ohm
Lead Channel Impedance Value: 304 Ohm
Lead Channel Impedance Value: 399 Ohm
Lead Channel Impedance Value: 475 Ohm
Lead Channel Impedance Value: 646 Ohm
Lead Channel Pacing Threshold Amplitude: 1 V
Lead Channel Pacing Threshold Pulse Width: 0.4 ms
Lead Channel Pacing Threshold Pulse Width: 0.4 ms
Lead Channel Pacing Threshold Pulse Width: 0.4 ms
Lead Channel Sensing Intrinsic Amplitude: 0.375 mV
Lead Channel Sensing Intrinsic Amplitude: 15.25 mV
Lead Channel Setting Pacing Amplitude: 2 V
Lead Channel Setting Pacing Amplitude: 2.5 V
Lead Channel Setting Pacing Pulse Width: 0.4 ms
Lead Channel Setting Pacing Pulse Width: 0.4 ms
MDC IDC MSMT BATTERY REMAINING LONGEVITY: 70 mo
MDC IDC MSMT LEADCHNL LV PACING THRESHOLD AMPLITUDE: 1 V
MDC IDC MSMT LEADCHNL RA IMPEDANCE VALUE: 399 Ohm
MDC IDC MSMT LEADCHNL RA SENSING INTR AMPL: 0.375 mV
MDC IDC MSMT LEADCHNL RV IMPEDANCE VALUE: 304 Ohm
MDC IDC MSMT LEADCHNL RV PACING THRESHOLD AMPLITUDE: 0.75 V
MDC IDC MSMT LEADCHNL RV SENSING INTR AMPL: 15.25 mV
MDC IDC SESS DTM: 20160830062728
MDC IDC SET LEADCHNL RV SENSING SENSITIVITY: 0.6 mV
MDC IDC SET ZONE DETECTION INTERVAL: 320 ms
MDC IDC SET ZONE DETECTION INTERVAL: 430 ms
MDC IDC SET ZONE DETECTION INTERVAL: 450 ms
MDC IDC STAT BRADY AS VS PERCENT: 37.35 %
Zone Setting Detection Interval: 360 ms

## 2014-09-08 NOTE — Telephone Encounter (Signed)
Spoke w/ pt's wife about discovery of erroneous programming via [employer]. Pt currently VVIR, should be DDDR. Change seems to have occurred at DCCV. Since DCCV, pt in sinus rhythm. Presenting EGM shows sinus rhythm w/ AV dyssynchrony due to VVIR. Pt has had worsening symptoms since programming change. OptiVol also showing worsening saturation since DCCV timeframe. Pt is father of Natnael Biederman, previous [employer] rep. Also spoke w/ [name]. Family aware pt needs to have appt made to reprogram back to DDDR. I offered courtesy appt today to fix programming. Family states it is difficult to transport pt. They said today is not possible. They will get back to [place] once they can coordinate an appt.

## 2014-09-12 ENCOUNTER — Ambulatory Visit (INDEPENDENT_AMBULATORY_CARE_PROVIDER_SITE_OTHER): Payer: Medicare Other | Admitting: Cardiovascular Disease

## 2014-09-12 DIAGNOSIS — Z952 Presence of prosthetic heart valve: Secondary | ICD-10-CM | POA: Diagnosis not present

## 2014-09-12 DIAGNOSIS — I4891 Unspecified atrial fibrillation: Secondary | ICD-10-CM | POA: Diagnosis not present

## 2014-09-12 DIAGNOSIS — Z5181 Encounter for therapeutic drug level monitoring: Secondary | ICD-10-CM

## 2014-09-12 DIAGNOSIS — E782 Mixed hyperlipidemia: Secondary | ICD-10-CM | POA: Diagnosis not present

## 2014-09-12 DIAGNOSIS — F0390 Unspecified dementia without behavioral disturbance: Secondary | ICD-10-CM | POA: Diagnosis not present

## 2014-09-12 DIAGNOSIS — Z125 Encounter for screening for malignant neoplasm of prostate: Secondary | ICD-10-CM | POA: Diagnosis not present

## 2014-09-12 DIAGNOSIS — Z0000 Encounter for general adult medical examination without abnormal findings: Secondary | ICD-10-CM | POA: Diagnosis not present

## 2014-09-12 DIAGNOSIS — I472 Ventricular tachycardia: Secondary | ICD-10-CM | POA: Diagnosis not present

## 2014-09-12 DIAGNOSIS — I10 Essential (primary) hypertension: Secondary | ICD-10-CM | POA: Diagnosis not present

## 2014-09-12 DIAGNOSIS — I5022 Chronic systolic (congestive) heart failure: Secondary | ICD-10-CM | POA: Diagnosis not present

## 2014-09-12 DIAGNOSIS — I5023 Acute on chronic systolic (congestive) heart failure: Secondary | ICD-10-CM | POA: Diagnosis not present

## 2014-09-12 DIAGNOSIS — F419 Anxiety disorder, unspecified: Secondary | ICD-10-CM | POA: Diagnosis not present

## 2014-09-12 DIAGNOSIS — R1310 Dysphagia, unspecified: Secondary | ICD-10-CM | POA: Diagnosis not present

## 2014-09-12 DIAGNOSIS — R7309 Other abnormal glucose: Secondary | ICD-10-CM | POA: Diagnosis not present

## 2014-09-12 DIAGNOSIS — R419 Unspecified symptoms and signs involving cognitive functions and awareness: Secondary | ICD-10-CM | POA: Diagnosis not present

## 2014-09-12 DIAGNOSIS — Z23 Encounter for immunization: Secondary | ICD-10-CM | POA: Diagnosis not present

## 2014-09-12 DIAGNOSIS — Z95810 Presence of automatic (implantable) cardiac defibrillator: Secondary | ICD-10-CM

## 2014-09-12 DIAGNOSIS — K220 Achalasia of cardia: Secondary | ICD-10-CM | POA: Diagnosis not present

## 2014-09-12 LAB — POCT INR: INR: 2.4

## 2014-09-13 ENCOUNTER — Telehealth (HOSPITAL_COMMUNITY): Payer: Self-pay | Admitting: *Deleted

## 2014-09-13 DIAGNOSIS — I4891 Unspecified atrial fibrillation: Secondary | ICD-10-CM | POA: Diagnosis not present

## 2014-09-13 DIAGNOSIS — I5023 Acute on chronic systolic (congestive) heart failure: Secondary | ICD-10-CM | POA: Diagnosis not present

## 2014-09-13 DIAGNOSIS — I472 Ventricular tachycardia: Secondary | ICD-10-CM | POA: Diagnosis not present

## 2014-09-13 DIAGNOSIS — R419 Unspecified symptoms and signs involving cognitive functions and awareness: Secondary | ICD-10-CM | POA: Diagnosis not present

## 2014-09-13 DIAGNOSIS — K220 Achalasia of cardia: Secondary | ICD-10-CM | POA: Diagnosis not present

## 2014-09-13 DIAGNOSIS — R1310 Dysphagia, unspecified: Secondary | ICD-10-CM | POA: Diagnosis not present

## 2014-09-13 NOTE — Telephone Encounter (Signed)
Received pt's Optivol transmission it shows fluid level is still elevated, spoke w/pt's wife she reports wt is at 192.8 today, it was 196 lb on Thur.  She states pt still c/o weakness/fatigue.  Discussed w/Dr Bensimhon he recommends pt take metolazone 2.5 mg x 2 days along with extra 20 meq of KCL.  Pt's wife is aware, agreeable and verbalizes understanding

## 2014-09-14 DIAGNOSIS — I4891 Unspecified atrial fibrillation: Secondary | ICD-10-CM | POA: Diagnosis not present

## 2014-09-14 DIAGNOSIS — I5023 Acute on chronic systolic (congestive) heart failure: Secondary | ICD-10-CM | POA: Diagnosis not present

## 2014-09-14 DIAGNOSIS — R419 Unspecified symptoms and signs involving cognitive functions and awareness: Secondary | ICD-10-CM | POA: Diagnosis not present

## 2014-09-14 DIAGNOSIS — R1310 Dysphagia, unspecified: Secondary | ICD-10-CM | POA: Diagnosis not present

## 2014-09-14 DIAGNOSIS — K220 Achalasia of cardia: Secondary | ICD-10-CM | POA: Diagnosis not present

## 2014-09-14 DIAGNOSIS — I472 Ventricular tachycardia: Secondary | ICD-10-CM | POA: Diagnosis not present

## 2014-09-15 DIAGNOSIS — I5023 Acute on chronic systolic (congestive) heart failure: Secondary | ICD-10-CM | POA: Diagnosis not present

## 2014-09-15 DIAGNOSIS — R1310 Dysphagia, unspecified: Secondary | ICD-10-CM | POA: Diagnosis not present

## 2014-09-15 DIAGNOSIS — R419 Unspecified symptoms and signs involving cognitive functions and awareness: Secondary | ICD-10-CM | POA: Diagnosis not present

## 2014-09-15 DIAGNOSIS — K220 Achalasia of cardia: Secondary | ICD-10-CM | POA: Diagnosis not present

## 2014-09-15 DIAGNOSIS — I472 Ventricular tachycardia: Secondary | ICD-10-CM | POA: Diagnosis not present

## 2014-09-15 DIAGNOSIS — I4891 Unspecified atrial fibrillation: Secondary | ICD-10-CM | POA: Diagnosis not present

## 2014-09-16 DIAGNOSIS — R419 Unspecified symptoms and signs involving cognitive functions and awareness: Secondary | ICD-10-CM | POA: Diagnosis not present

## 2014-09-16 DIAGNOSIS — K220 Achalasia of cardia: Secondary | ICD-10-CM | POA: Diagnosis not present

## 2014-09-16 DIAGNOSIS — I5023 Acute on chronic systolic (congestive) heart failure: Secondary | ICD-10-CM | POA: Diagnosis not present

## 2014-09-16 DIAGNOSIS — I472 Ventricular tachycardia: Secondary | ICD-10-CM | POA: Diagnosis not present

## 2014-09-16 DIAGNOSIS — I4891 Unspecified atrial fibrillation: Secondary | ICD-10-CM | POA: Diagnosis not present

## 2014-09-16 DIAGNOSIS — I5022 Chronic systolic (congestive) heart failure: Secondary | ICD-10-CM | POA: Diagnosis not present

## 2014-09-16 DIAGNOSIS — R1310 Dysphagia, unspecified: Secondary | ICD-10-CM | POA: Diagnosis not present

## 2014-09-18 DIAGNOSIS — R419 Unspecified symptoms and signs involving cognitive functions and awareness: Secondary | ICD-10-CM | POA: Diagnosis not present

## 2014-09-18 DIAGNOSIS — K220 Achalasia of cardia: Secondary | ICD-10-CM | POA: Diagnosis not present

## 2014-09-18 DIAGNOSIS — I5023 Acute on chronic systolic (congestive) heart failure: Secondary | ICD-10-CM | POA: Diagnosis not present

## 2014-09-18 DIAGNOSIS — I4891 Unspecified atrial fibrillation: Secondary | ICD-10-CM | POA: Diagnosis not present

## 2014-09-18 DIAGNOSIS — R1310 Dysphagia, unspecified: Secondary | ICD-10-CM | POA: Diagnosis not present

## 2014-09-18 DIAGNOSIS — I472 Ventricular tachycardia: Secondary | ICD-10-CM | POA: Diagnosis not present

## 2014-09-19 ENCOUNTER — Ambulatory Visit (INDEPENDENT_AMBULATORY_CARE_PROVIDER_SITE_OTHER): Payer: Medicare Other | Admitting: Internal Medicine

## 2014-09-19 DIAGNOSIS — Z5181 Encounter for therapeutic drug level monitoring: Secondary | ICD-10-CM

## 2014-09-19 DIAGNOSIS — K220 Achalasia of cardia: Secondary | ICD-10-CM | POA: Diagnosis not present

## 2014-09-19 DIAGNOSIS — Z95810 Presence of automatic (implantable) cardiac defibrillator: Secondary | ICD-10-CM

## 2014-09-19 DIAGNOSIS — R1310 Dysphagia, unspecified: Secondary | ICD-10-CM | POA: Diagnosis not present

## 2014-09-19 DIAGNOSIS — I4891 Unspecified atrial fibrillation: Secondary | ICD-10-CM

## 2014-09-19 DIAGNOSIS — I5023 Acute on chronic systolic (congestive) heart failure: Secondary | ICD-10-CM | POA: Diagnosis not present

## 2014-09-19 DIAGNOSIS — R419 Unspecified symptoms and signs involving cognitive functions and awareness: Secondary | ICD-10-CM | POA: Diagnosis not present

## 2014-09-19 DIAGNOSIS — I472 Ventricular tachycardia: Secondary | ICD-10-CM | POA: Diagnosis not present

## 2014-09-19 LAB — POCT INR: INR: 3.2

## 2014-09-20 ENCOUNTER — Encounter: Payer: Self-pay | Admitting: Cardiology

## 2014-09-20 DIAGNOSIS — I472 Ventricular tachycardia: Secondary | ICD-10-CM | POA: Diagnosis not present

## 2014-09-20 DIAGNOSIS — I5023 Acute on chronic systolic (congestive) heart failure: Secondary | ICD-10-CM | POA: Diagnosis not present

## 2014-09-20 DIAGNOSIS — R1310 Dysphagia, unspecified: Secondary | ICD-10-CM | POA: Diagnosis not present

## 2014-09-20 DIAGNOSIS — I4891 Unspecified atrial fibrillation: Secondary | ICD-10-CM | POA: Diagnosis not present

## 2014-09-20 DIAGNOSIS — R419 Unspecified symptoms and signs involving cognitive functions and awareness: Secondary | ICD-10-CM | POA: Diagnosis not present

## 2014-09-20 DIAGNOSIS — K220 Achalasia of cardia: Secondary | ICD-10-CM | POA: Diagnosis not present

## 2014-09-22 ENCOUNTER — Other Ambulatory Visit (HOSPITAL_COMMUNITY): Payer: Self-pay | Admitting: Internal Medicine

## 2014-09-22 DIAGNOSIS — R419 Unspecified symptoms and signs involving cognitive functions and awareness: Secondary | ICD-10-CM | POA: Diagnosis not present

## 2014-09-22 DIAGNOSIS — I4891 Unspecified atrial fibrillation: Secondary | ICD-10-CM | POA: Diagnosis not present

## 2014-09-22 DIAGNOSIS — K220 Achalasia of cardia: Secondary | ICD-10-CM | POA: Diagnosis not present

## 2014-09-22 DIAGNOSIS — R1310 Dysphagia, unspecified: Secondary | ICD-10-CM | POA: Diagnosis not present

## 2014-09-22 DIAGNOSIS — I5023 Acute on chronic systolic (congestive) heart failure: Secondary | ICD-10-CM | POA: Diagnosis not present

## 2014-09-22 DIAGNOSIS — I472 Ventricular tachycardia: Secondary | ICD-10-CM | POA: Diagnosis not present

## 2014-09-25 ENCOUNTER — Other Ambulatory Visit: Payer: Self-pay | Admitting: Internal Medicine

## 2014-09-26 ENCOUNTER — Ambulatory Visit (INDEPENDENT_AMBULATORY_CARE_PROVIDER_SITE_OTHER): Payer: Medicare Other | Admitting: Pharmacist

## 2014-09-26 DIAGNOSIS — I4891 Unspecified atrial fibrillation: Secondary | ICD-10-CM

## 2014-09-26 DIAGNOSIS — Z95810 Presence of automatic (implantable) cardiac defibrillator: Secondary | ICD-10-CM

## 2014-09-26 DIAGNOSIS — I472 Ventricular tachycardia: Secondary | ICD-10-CM | POA: Diagnosis not present

## 2014-09-26 DIAGNOSIS — Z5181 Encounter for therapeutic drug level monitoring: Secondary | ICD-10-CM

## 2014-09-26 DIAGNOSIS — K220 Achalasia of cardia: Secondary | ICD-10-CM | POA: Diagnosis not present

## 2014-09-26 DIAGNOSIS — I5023 Acute on chronic systolic (congestive) heart failure: Secondary | ICD-10-CM | POA: Diagnosis not present

## 2014-09-26 DIAGNOSIS — R1310 Dysphagia, unspecified: Secondary | ICD-10-CM | POA: Diagnosis not present

## 2014-09-26 DIAGNOSIS — R419 Unspecified symptoms and signs involving cognitive functions and awareness: Secondary | ICD-10-CM | POA: Diagnosis not present

## 2014-09-26 LAB — POCT INR: INR: 1.6

## 2014-09-28 ENCOUNTER — Ambulatory Visit (HOSPITAL_COMMUNITY)
Admission: RE | Admit: 2014-09-28 | Discharge: 2014-09-28 | Disposition: A | Discharge status: Home / Self Care | Payer: Medicare Other | Source: Ambulatory Visit | Attending: Internal Medicine | Admitting: Internal Medicine

## 2014-09-28 VITALS — BP 96/48 | HR 93 | Wt 187.0 lb

## 2014-09-28 DIAGNOSIS — I447 Left bundle-branch block, unspecified: Secondary | ICD-10-CM | POA: Insufficient documentation

## 2014-09-28 DIAGNOSIS — Z7289 Other problems related to lifestyle: Secondary | ICD-10-CM

## 2014-09-28 DIAGNOSIS — I472 Ventricular tachycardia: Secondary | ICD-10-CM | POA: Diagnosis not present

## 2014-09-28 DIAGNOSIS — I480 Paroxysmal atrial fibrillation: Secondary | ICD-10-CM | POA: Diagnosis not present

## 2014-09-28 DIAGNOSIS — Z7982 Long term (current) use of aspirin: Secondary | ICD-10-CM | POA: Diagnosis not present

## 2014-09-28 DIAGNOSIS — F0390 Unspecified dementia without behavioral disturbance: Secondary | ICD-10-CM | POA: Diagnosis not present

## 2014-09-28 DIAGNOSIS — I4891 Unspecified atrial fibrillation: Secondary | ICD-10-CM | POA: Diagnosis not present

## 2014-09-28 DIAGNOSIS — Z95810 Presence of automatic (implantable) cardiac defibrillator: Secondary | ICD-10-CM | POA: Insufficient documentation

## 2014-09-28 DIAGNOSIS — I428 Other cardiomyopathies: Secondary | ICD-10-CM | POA: Insufficient documentation

## 2014-09-28 DIAGNOSIS — Z952 Presence of prosthetic heart valve: Secondary | ICD-10-CM | POA: Insufficient documentation

## 2014-09-28 DIAGNOSIS — R419 Unspecified symptoms and signs involving cognitive functions and awareness: Secondary | ICD-10-CM | POA: Diagnosis not present

## 2014-09-28 DIAGNOSIS — R413 Other amnesia: Secondary | ICD-10-CM | POA: Insufficient documentation

## 2014-09-28 DIAGNOSIS — I5022 Chronic systolic (congestive) heart failure: Secondary | ICD-10-CM | POA: Diagnosis not present

## 2014-09-28 DIAGNOSIS — Z7901 Long term (current) use of anticoagulants: Secondary | ICD-10-CM | POA: Insufficient documentation

## 2014-09-28 DIAGNOSIS — N183 Chronic kidney disease, stage 3 (moderate): Secondary | ICD-10-CM | POA: Insufficient documentation

## 2014-09-28 DIAGNOSIS — Z79899 Other long term (current) drug therapy: Secondary | ICD-10-CM | POA: Insufficient documentation

## 2014-09-28 DIAGNOSIS — R1310 Dysphagia, unspecified: Secondary | ICD-10-CM | POA: Diagnosis not present

## 2014-09-28 DIAGNOSIS — I5023 Acute on chronic systolic (congestive) heart failure: Secondary | ICD-10-CM | POA: Diagnosis not present

## 2014-09-28 DIAGNOSIS — K220 Achalasia of cardia: Secondary | ICD-10-CM | POA: Diagnosis not present

## 2014-09-28 DIAGNOSIS — Z789 Other specified health status: Secondary | ICD-10-CM

## 2014-09-28 DIAGNOSIS — F1099 Alcohol use, unspecified with unspecified alcohol-induced disorder: Secondary | ICD-10-CM | POA: Diagnosis not present

## 2014-09-28 LAB — BASIC METABOLIC PANEL
Anion gap: 9 (ref 5–15)
BUN: 33 mg/dL — AB (ref 6–20)
CHLORIDE: 97 mmol/L — AB (ref 101–111)
CO2: 29 mmol/L (ref 22–32)
CREATININE: 0.88 mg/dL (ref 0.61–1.24)
Calcium: 8.7 mg/dL — ABNORMAL LOW (ref 8.9–10.3)
GFR calc Af Amer: >60 mL/min (ref >60)
GFR calc non Af Amer: >60 mL/min (ref >60)
GLUCOSE: 105 mg/dL — AB (ref 65–99)
POTASSIUM: 4.2 mmol/L (ref 3.5–5.1)
SODIUM: 135 mmol/L (ref 135–145)

## 2014-09-28 LAB — CBC
HEMATOCRIT: 40.8 % (ref 39.0–52.0)
Hemoglobin: 13.3 g/dL (ref 13.0–17.0)
MCH: 30.9 pg (ref 26.0–34.0)
MCHC: 32.6 g/dL (ref 30.0–36.0)
MCV: 94.9 fL (ref 78.0–100.0)
PLATELETS: 107 10*3/uL — AB (ref 150–400)
RBC: 4.3 MIL/uL (ref 4.22–5.81)
RDW: 16.7 % — AB (ref 11.5–15.5)
WBC: 6.8 10*3/uL (ref 4.0–10.5)

## 2014-09-28 NOTE — Patient Instructions (Signed)
Continue the Carvedilol at 3.125 mg Twice daily   Labs today  Your physician recommends that you schedule a follow-up appointment in: 6 weeks with Dr Haroldine Laws

## 2014-09-28 NOTE — Addendum Note (Signed)
Encounter addended by: Scarlette Calico, RN on: 09/28/2014  3:07 PM<BR>     Documentation filed: Dx Association, Patient Instructions Section, Orders

## 2014-09-28 NOTE — Addendum Note (Signed)
Encounter addended by: Jolaine Artist, MD on: 09/28/2014  3:02 PM<BR>     Documentation filed: Notes Section

## 2014-09-28 NOTE — Progress Notes (Addendum)
Advanced Heart Failure Note   Patient ID: Jon Butler, male   DOB: 07-Mar-1935, 79 y.o.   MRN: [id number] PCP: Dr Lysle Rubens EP: Dr Lovena [facility] HF: Bensimhon  HPI: Jon Butler is a 79 yo male with a history of chronic systolic HF EF 93% due to NICM s/p CRT-D (Medtronic), mechanical AVR (St Jude), LBBB, PAF S/P DC-CV 12/20/12, ETOH abuse and dementia and non-compliance.   Admitted to [facility] 10/10 through 10/26/12 with ADHF in setting of recurrent AF. Developed cardiogenic shock and VDRF   BB and ACE-I held and amiodarone gtt was started. Required short term Milrinone and lasix.Diuresed 26 pounds and transitioned to 40 mg daily. DC-CV on 10/20. Remains on Coumadin. D/C weight 206 pounds.   S/P successful DC-CV 12/20/12. Amio increased to 400 bid.   Has seen Dr. Sherron Flemings in Neurology for progressive cognitive decline in setting of ETOH abuse. Also had foot drop.   Admitted in 8/16 for respiratory failure due to PNA and HF in setting of recurrent AF. Underwent TEE/DC-CV with nearly immediate reversion to AF.Seen by EP and felt to have end-stage HF and rate control strategy recommended. Amio stopped. Also underwent botox injection in esophagus for esophageal spasm/dysphagia. Made DNR and ICD deactivated. Weight at home 193-194 (was discharged at 191 pound)   Returns for Heart Failure follow up.  At last visit we increased his Lasix and KCl. We also started him back on amiodarone. He called with continued SOB and was instructed to take metolazone 2.5 mg x 2 days with extra KCl.  Has felt much better since, but BPs have been lower.  83/50 on 9/21.  He denies lightheaded or dizziness. Weights at home 184-186. Weight down 9 lbs from last visit.  Denies dyspnea or edema.  Functional status limited, but walking more around the house with a walker.  Doesn't go out of the house. He denies orthopnea or PND.   11/01/12 Labs INR > 8 10/26/12 K 3.0 Creatinine 1.23  12/16/12 K 4.6 Creatinine  1.05 12/20/12 K 4.0 Creatinine 1.04 11/15      K 4.4 Creatinine 1.1 5/16: K 4.3 Creatinine 0.94  6/16: INR 1.9  09/07/14: K 4.0, Cr 1.00  ICD interrogation : Optivol stable and below threshold. Back in NSR. No VT. Activity level poor < 1 hr per day. ReDS vest reading 32% (down from 44% last visit)  ROS: All systems negative except as listed in HPI, PMH and Problem List.  Past Medical History  Diagnosis Date  . Left bundle-branch block   . Cardiomyopathy   . Chronic systolic heart failure   . Atrial fibrillation     Current Outpatient Prescriptions  Medication Sig Dispense Refill  . allopurinol (ZYLOPRIM) 300 MG tablet Take 300 mg by mouth daily.     Marland Kitchen amiodarone (PACERONE) 200 MG tablet Take 1 tablet (200 mg total) by mouth daily. 30 tablet 3  . amoxicillin (AMOXIL) 500 MG capsule Take 500 mg by mouth daily.    Marland Kitchen aspirin EC 81 MG tablet Take 1 tablet (81 mg total) by mouth daily.    . carvedilol (COREG) 3.125 MG tablet Take 3.125 mg by mouth 2 (two) times daily with a meal.    . Coenzyme Q10 (CO Q-10 PO) Take 1 tablet by mouth daily.     . colchicine 0.6 MG tablet Take 0.6 mg by mouth daily as needed (gout pain).     Marland Kitchen donepezil (ARICEPT) 23 MG TABS tablet Take 1 tablet (23 mg total)  by mouth at bedtime. 30 tablet 6  . fluticasone (FLONASE) 50 MCG/ACT nasal spray Place 2 sprays into both nostrils daily as needed for allergies or rhinitis.     . furosemide (LASIX) 40 MG tablet Take 1 tablet (40 mg total) by mouth 2 (two) times daily. 60 tablet 3  . lisinopril (PRINIVIL,ZESTRIL) 10 MG tablet Take 0.5 tablets (5 mg total) by mouth at bedtime. 30 tablet 4  . LORazepam (ATIVAN) 0.5 MG tablet Take 1 tablet (0.5 mg total) by mouth every 8 (eight) hours as needed for anxiety. 20 tablet 0  . memantine (NAMENDA XR) 28 MG CP24 24 hr capsule Take 1 capsule (28 mg total) by mouth daily. 30 capsule 6  . metolazone (ZAROXOLYN) 2.5 MG tablet Take 1 tablet (2.5 mg total) by mouth daily. 10 tablet 3   . Multiple Vitamins-Minerals (MULTIVITAMIN WITH MINERALS) tablet Take 1 tablet by mouth daily.     . potassium chloride SA (K-DUR,KLOR-CON) 20 MEQ tablet Take 1 tablet (20 mEq total) by mouth daily. Take 1 extra tab when you take metolazone 30 tablet 3  . rosuvastatin (CRESTOR) 10 MG tablet Take 5 mg by mouth daily.     Marland Kitchen spironolactone (ALDACTONE) 25 MG tablet Take 0.5 tablets (12.5 mg total) by mouth daily. 30 tablet 3  . thiamine (VITAMIN B-1) 100 MG tablet Take 1 tablet (100 mg total) by mouth 2 (two) times daily. 60 tablet 6  . traZODone (DESYREL) 100 MG tablet Take 400 mg by mouth at bedtime.  2  . warfarin (COUMADIN) 2 MG tablet Take as directed by coumadin clinic (Patient taking differently: Take 2-3 mg by mouth daily at 6 PM. Take 2 mg daily except on Tuesday and Saturday. Take 3 mg on Tuesday and Saturday) 90 tablet 1   No current facility-administered medications for this encounter.   Filed Vitals:   09/28/14 1409  BP: 96/48  Pulse: 93  Weight: 187 lb (84.823 kg)  SpO2: 96%   Orthostatics 92/64 sitting 86/60 standing  PHYSICAL EXAM: General:  Elderly. No resp difficulty Wife present HEENT: normal Neck: supple. JVP 8 cm.  Carotids 2+ bilaterally; no bruits. No lymphadenopathy or thryomegaly appreciated. Cor: PMI normal. RRR. Mechanical s2 No rubs, gallops 2/6 TR. Lungs: Diminished bases Abdomen: soft, nontender, mild distention. No HSM. No bruits or masses. Good bowel sounds. Extremities: no cyanosis, clubbing, rash, Trace pre tibial  edema Neuro: alert & orientedx3, cranial nerves grossly intact. Moves all 4 extremities w/o difficulty. Affect flat.  ASSESSMENT & PLAN: 1. Chronic Systolic Heart Failure TEE with EF 15-20%  S/P Medtronic CRT-D 09/2012  - Volume status is stable on Optivol and ReDS reading 32. Trace edema in legs but this is baseline per pt. - Continue lasix 40 bid and KCL 20 meq. Goal weight 184-186 - NYHA III-IIIB symptoms but seems improved with return of  NSR -Continue current meds.  - Has been on Coreg 3.125 BID chronically, despite chart showing 6.25 BID as far back as 2014. - Reinforced the need and importance of daily weights, a low sodium diet, and fluid restriction (less than 2 L a day). Instructed to call the HF clinic if weight increases more than 3 lbs overnight or 5 lbs in a week.   2. A Fib-  S/P DC-CV 12/20/12.  - failed DCCV in hospital so amio stopped but now back in NSR.  Continue amio at 200 daily.  Can start Ranexa for synergy as needed.  Continue coumadin for AF and AVR.  3. CKD stage 3- Check BMET today.  4. Alcohol- Reinforced alcohol abstinence especially with coumadin.  5. Memory loss-  Neurology following. On namenda and aricept. 6. Mechanical AVR - on coumadin. SBE prophylaxis for all procedures.   BMET today. Follow up in   Shirley Friar, PA-C 2:19 PM  Patient seen and examined with Oda Kilts, PA-C. We discussed all aspects of the encounter. I agree with the assessment and plan as stated above.    Overall much improved. Volume status looks good on exam and by Optivol and ReDS. With low BP need to be careful to not overdiurese. Will check labs today. Can hold lasix for weight below 184. Stressed need to avoid ETOH.   Bensimhon, Daniel,MD 2:54 PM

## 2014-09-29 ENCOUNTER — Other Ambulatory Visit (HOSPITAL_COMMUNITY): Payer: Self-pay | Admitting: Adult Health

## 2014-10-02 ENCOUNTER — Telehealth (HOSPITAL_COMMUNITY): Payer: Self-pay | Admitting: *Deleted

## 2014-10-02 MED ORDER — FUROSEMIDE 40 MG PO TABS: 40.0000 mg | ORAL_TABLET | Freq: Every day | ORAL | Status: DC

## 2014-10-02 NOTE — Telephone Encounter (Signed)
pts home health nurse called stating she was advised to call if systolic bp was below 90.  His bp was running between 93/66-79/39.  I advised her to hold pts lasix x 2days and to restart at only 40mg  daily taking an extra 40mg  if weight was up (per Dr.Bensimhon)

## 2014-10-03 ENCOUNTER — Encounter: Payer: Self-pay | Admitting: Internal Medicine

## 2014-10-03 ENCOUNTER — Ambulatory Visit (INDEPENDENT_AMBULATORY_CARE_PROVIDER_SITE_OTHER): Payer: Medicare Other | Admitting: Cardiovascular Disease

## 2014-10-03 DIAGNOSIS — K220 Achalasia of cardia: Secondary | ICD-10-CM | POA: Diagnosis not present

## 2014-10-03 DIAGNOSIS — Z95810 Presence of automatic (implantable) cardiac defibrillator: Secondary | ICD-10-CM

## 2014-10-03 DIAGNOSIS — I480 Paroxysmal atrial fibrillation: Secondary | ICD-10-CM

## 2014-10-03 DIAGNOSIS — R1310 Dysphagia, unspecified: Secondary | ICD-10-CM | POA: Diagnosis not present

## 2014-10-03 DIAGNOSIS — I472 Ventricular tachycardia: Secondary | ICD-10-CM | POA: Diagnosis not present

## 2014-10-03 DIAGNOSIS — I4891 Unspecified atrial fibrillation: Secondary | ICD-10-CM | POA: Diagnosis not present

## 2014-10-03 DIAGNOSIS — I5023 Acute on chronic systolic (congestive) heart failure: Secondary | ICD-10-CM | POA: Diagnosis not present

## 2014-10-03 DIAGNOSIS — Z5181 Encounter for therapeutic drug level monitoring: Secondary | ICD-10-CM

## 2014-10-03 DIAGNOSIS — R419 Unspecified symptoms and signs involving cognitive functions and awareness: Secondary | ICD-10-CM | POA: Diagnosis not present

## 2014-10-03 LAB — POCT INR: INR: 1.6

## 2014-10-04 ENCOUNTER — Telehealth: Payer: Self-pay | Admitting: Nurse Practitioner

## 2014-10-04 NOTE — Telephone Encounter (Signed)
As per last note from [facility], patient's device is programmed VVIR but patient is now in SR and is AV dissociated.  Need to reprogram to DDDR to try to improve hemodynamics.  Will attempt to have patient come into clinic for device reprogramming.  Chanetta Marshall, NP 10/04/2014 7:02 PM

## 2014-10-05 ENCOUNTER — Encounter: Payer: Self-pay | Admitting: Internal Medicine

## 2014-10-05 ENCOUNTER — Telehealth: Payer: Self-pay | Admitting: Internal Medicine

## 2014-10-05 NOTE — Telephone Encounter (Signed)
New message      Someone called yesterday and spoke with the son stating that we were going to call or come to their home to "do something".  What are we going to do?

## 2014-10-05 NOTE — Telephone Encounter (Signed)
Spoke with [name], who states [employer] rep "just walked through the door".  She denies any additional questions or concerns at this time.

## 2014-10-05 NOTE — Telephone Encounter (Signed)
LMOM.  Will also try home phone number.

## 2014-10-06 DIAGNOSIS — R1310 Dysphagia, unspecified: Secondary | ICD-10-CM | POA: Diagnosis not present

## 2014-10-06 DIAGNOSIS — I5023 Acute on chronic systolic (congestive) heart failure: Secondary | ICD-10-CM | POA: Diagnosis not present

## 2014-10-06 DIAGNOSIS — I472 Ventricular tachycardia: Secondary | ICD-10-CM | POA: Diagnosis not present

## 2014-10-06 DIAGNOSIS — K220 Achalasia of cardia: Secondary | ICD-10-CM | POA: Diagnosis not present

## 2014-10-06 DIAGNOSIS — I4891 Unspecified atrial fibrillation: Secondary | ICD-10-CM | POA: Diagnosis not present

## 2014-10-06 DIAGNOSIS — R419 Unspecified symptoms and signs involving cognitive functions and awareness: Secondary | ICD-10-CM | POA: Diagnosis not present

## 2014-10-09 DIAGNOSIS — R419 Unspecified symptoms and signs involving cognitive functions and awareness: Secondary | ICD-10-CM | POA: Diagnosis not present

## 2014-10-09 DIAGNOSIS — R1310 Dysphagia, unspecified: Secondary | ICD-10-CM | POA: Diagnosis not present

## 2014-10-09 DIAGNOSIS — I4891 Unspecified atrial fibrillation: Secondary | ICD-10-CM | POA: Diagnosis not present

## 2014-10-09 DIAGNOSIS — I5023 Acute on chronic systolic (congestive) heart failure: Secondary | ICD-10-CM | POA: Diagnosis not present

## 2014-10-09 DIAGNOSIS — I472 Ventricular tachycardia: Secondary | ICD-10-CM | POA: Diagnosis not present

## 2014-10-09 DIAGNOSIS — K220 Achalasia of cardia: Secondary | ICD-10-CM | POA: Diagnosis not present

## 2014-10-12 ENCOUNTER — Ambulatory Visit (INDEPENDENT_AMBULATORY_CARE_PROVIDER_SITE_OTHER): Payer: Medicare Other | Admitting: Internal Medicine

## 2014-10-12 DIAGNOSIS — R1310 Dysphagia, unspecified: Secondary | ICD-10-CM | POA: Diagnosis not present

## 2014-10-12 DIAGNOSIS — K220 Achalasia of cardia: Secondary | ICD-10-CM | POA: Diagnosis not present

## 2014-10-12 DIAGNOSIS — I472 Ventricular tachycardia: Secondary | ICD-10-CM | POA: Diagnosis not present

## 2014-10-12 DIAGNOSIS — I480 Paroxysmal atrial fibrillation: Secondary | ICD-10-CM

## 2014-10-12 DIAGNOSIS — Z95810 Presence of automatic (implantable) cardiac defibrillator: Secondary | ICD-10-CM

## 2014-10-12 DIAGNOSIS — I5023 Acute on chronic systolic (congestive) heart failure: Secondary | ICD-10-CM | POA: Diagnosis not present

## 2014-10-12 DIAGNOSIS — I4891 Unspecified atrial fibrillation: Secondary | ICD-10-CM | POA: Diagnosis not present

## 2014-10-12 DIAGNOSIS — Z5181 Encounter for therapeutic drug level monitoring: Secondary | ICD-10-CM

## 2014-10-12 DIAGNOSIS — R419 Unspecified symptoms and signs involving cognitive functions and awareness: Secondary | ICD-10-CM | POA: Diagnosis not present

## 2014-10-12 LAB — POCT INR: INR: 2.2

## 2014-10-17 DIAGNOSIS — R419 Unspecified symptoms and signs involving cognitive functions and awareness: Secondary | ICD-10-CM | POA: Diagnosis not present

## 2014-10-17 DIAGNOSIS — I472 Ventricular tachycardia: Secondary | ICD-10-CM | POA: Diagnosis not present

## 2014-10-17 DIAGNOSIS — K220 Achalasia of cardia: Secondary | ICD-10-CM | POA: Diagnosis not present

## 2014-10-17 DIAGNOSIS — R1310 Dysphagia, unspecified: Secondary | ICD-10-CM | POA: Diagnosis not present

## 2014-10-17 DIAGNOSIS — I5023 Acute on chronic systolic (congestive) heart failure: Secondary | ICD-10-CM | POA: Diagnosis not present

## 2014-10-17 DIAGNOSIS — I4891 Unspecified atrial fibrillation: Secondary | ICD-10-CM | POA: Diagnosis not present

## 2014-10-18 ENCOUNTER — Other Ambulatory Visit: Payer: Self-pay | Admitting: Dermatology

## 2014-10-18 ENCOUNTER — Telehealth (HOSPITAL_COMMUNITY): Payer: Self-pay | Admitting: Vascular Surgery

## 2014-10-18 DIAGNOSIS — D225 Melanocytic nevi of trunk: Secondary | ICD-10-CM | POA: Diagnosis not present

## 2014-10-18 DIAGNOSIS — L820 Inflamed seborrheic keratosis: Secondary | ICD-10-CM | POA: Diagnosis not present

## 2014-10-18 DIAGNOSIS — C44622 Squamous cell carcinoma of skin of right upper limb, including shoulder: Secondary | ICD-10-CM | POA: Diagnosis not present

## 2014-10-18 DIAGNOSIS — C44529 Squamous cell carcinoma of skin of other part of trunk: Secondary | ICD-10-CM | POA: Diagnosis not present

## 2014-10-18 DIAGNOSIS — L821 Other seborrheic keratosis: Secondary | ICD-10-CM | POA: Diagnosis not present

## 2014-10-18 NOTE — Telephone Encounter (Signed)
Left message to make appt in Nov

## 2014-10-19 DIAGNOSIS — K220 Achalasia of cardia: Secondary | ICD-10-CM | POA: Diagnosis not present

## 2014-10-19 DIAGNOSIS — I4891 Unspecified atrial fibrillation: Secondary | ICD-10-CM | POA: Diagnosis not present

## 2014-10-19 DIAGNOSIS — R419 Unspecified symptoms and signs involving cognitive functions and awareness: Secondary | ICD-10-CM | POA: Diagnosis not present

## 2014-10-19 DIAGNOSIS — I472 Ventricular tachycardia: Secondary | ICD-10-CM | POA: Diagnosis not present

## 2014-10-19 DIAGNOSIS — R1310 Dysphagia, unspecified: Secondary | ICD-10-CM | POA: Diagnosis not present

## 2014-10-19 DIAGNOSIS — I5023 Acute on chronic systolic (congestive) heart failure: Secondary | ICD-10-CM | POA: Diagnosis not present

## 2014-10-20 ENCOUNTER — Encounter: Payer: Self-pay | Admitting: Internal Medicine

## 2014-10-22 DIAGNOSIS — I4891 Unspecified atrial fibrillation: Secondary | ICD-10-CM | POA: Diagnosis not present

## 2014-10-22 DIAGNOSIS — R4189 Other symptoms and signs involving cognitive functions and awareness: Secondary | ICD-10-CM | POA: Diagnosis not present

## 2014-10-22 DIAGNOSIS — Z5181 Encounter for therapeutic drug level monitoring: Secondary | ICD-10-CM | POA: Diagnosis not present

## 2014-10-22 DIAGNOSIS — I447 Left bundle-branch block, unspecified: Secondary | ICD-10-CM | POA: Diagnosis not present

## 2014-10-22 DIAGNOSIS — I5022 Chronic systolic (congestive) heart failure: Secondary | ICD-10-CM | POA: Diagnosis not present

## 2014-10-22 DIAGNOSIS — I472 Ventricular tachycardia: Secondary | ICD-10-CM | POA: Diagnosis not present

## 2014-10-24 DIAGNOSIS — R4189 Other symptoms and signs involving cognitive functions and awareness: Secondary | ICD-10-CM | POA: Diagnosis not present

## 2014-10-24 DIAGNOSIS — I447 Left bundle-branch block, unspecified: Secondary | ICD-10-CM | POA: Diagnosis not present

## 2014-10-24 DIAGNOSIS — I4891 Unspecified atrial fibrillation: Secondary | ICD-10-CM | POA: Diagnosis not present

## 2014-10-24 DIAGNOSIS — I472 Ventricular tachycardia: Secondary | ICD-10-CM | POA: Diagnosis not present

## 2014-10-24 DIAGNOSIS — I5022 Chronic systolic (congestive) heart failure: Secondary | ICD-10-CM | POA: Diagnosis not present

## 2014-10-24 DIAGNOSIS — Z5181 Encounter for therapeutic drug level monitoring: Secondary | ICD-10-CM | POA: Diagnosis not present

## 2014-10-26 ENCOUNTER — Ambulatory Visit (INDEPENDENT_AMBULATORY_CARE_PROVIDER_SITE_OTHER): Payer: Medicare Other | Admitting: Cardiology

## 2014-10-26 DIAGNOSIS — I472 Ventricular tachycardia: Secondary | ICD-10-CM | POA: Diagnosis not present

## 2014-10-26 DIAGNOSIS — I480 Paroxysmal atrial fibrillation: Secondary | ICD-10-CM

## 2014-10-26 DIAGNOSIS — Z5181 Encounter for therapeutic drug level monitoring: Secondary | ICD-10-CM | POA: Diagnosis not present

## 2014-10-26 DIAGNOSIS — Z95810 Presence of automatic (implantable) cardiac defibrillator: Secondary | ICD-10-CM

## 2014-10-26 DIAGNOSIS — I5022 Chronic systolic (congestive) heart failure: Secondary | ICD-10-CM | POA: Diagnosis not present

## 2014-10-26 DIAGNOSIS — I447 Left bundle-branch block, unspecified: Secondary | ICD-10-CM | POA: Diagnosis not present

## 2014-10-26 DIAGNOSIS — R4189 Other symptoms and signs involving cognitive functions and awareness: Secondary | ICD-10-CM | POA: Diagnosis not present

## 2014-10-26 DIAGNOSIS — I4891 Unspecified atrial fibrillation: Secondary | ICD-10-CM | POA: Diagnosis not present

## 2014-10-26 LAB — POCT INR: INR: 1.7

## 2014-10-31 DIAGNOSIS — I447 Left bundle-branch block, unspecified: Secondary | ICD-10-CM | POA: Diagnosis not present

## 2014-10-31 DIAGNOSIS — I4891 Unspecified atrial fibrillation: Secondary | ICD-10-CM | POA: Diagnosis not present

## 2014-10-31 DIAGNOSIS — R4189 Other symptoms and signs involving cognitive functions and awareness: Secondary | ICD-10-CM | POA: Diagnosis not present

## 2014-10-31 DIAGNOSIS — I472 Ventricular tachycardia: Secondary | ICD-10-CM | POA: Diagnosis not present

## 2014-10-31 DIAGNOSIS — I5022 Chronic systolic (congestive) heart failure: Secondary | ICD-10-CM | POA: Diagnosis not present

## 2014-10-31 DIAGNOSIS — Z5181 Encounter for therapeutic drug level monitoring: Secondary | ICD-10-CM | POA: Diagnosis not present

## 2014-11-01 DIAGNOSIS — I5022 Chronic systolic (congestive) heart failure: Secondary | ICD-10-CM | POA: Diagnosis not present

## 2014-11-01 DIAGNOSIS — I447 Left bundle-branch block, unspecified: Secondary | ICD-10-CM | POA: Diagnosis not present

## 2014-11-01 DIAGNOSIS — I4891 Unspecified atrial fibrillation: Secondary | ICD-10-CM | POA: Diagnosis not present

## 2014-11-01 DIAGNOSIS — Z5181 Encounter for therapeutic drug level monitoring: Secondary | ICD-10-CM | POA: Diagnosis not present

## 2014-11-01 DIAGNOSIS — I472 Ventricular tachycardia: Secondary | ICD-10-CM | POA: Diagnosis not present

## 2014-11-01 DIAGNOSIS — R4189 Other symptoms and signs involving cognitive functions and awareness: Secondary | ICD-10-CM | POA: Diagnosis not present

## 2014-11-02 DIAGNOSIS — Z5181 Encounter for therapeutic drug level monitoring: Secondary | ICD-10-CM | POA: Diagnosis not present

## 2014-11-02 DIAGNOSIS — I5022 Chronic systolic (congestive) heart failure: Secondary | ICD-10-CM | POA: Diagnosis not present

## 2014-11-02 DIAGNOSIS — R4189 Other symptoms and signs involving cognitive functions and awareness: Secondary | ICD-10-CM | POA: Diagnosis not present

## 2014-11-02 DIAGNOSIS — I472 Ventricular tachycardia: Secondary | ICD-10-CM | POA: Diagnosis not present

## 2014-11-02 DIAGNOSIS — I447 Left bundle-branch block, unspecified: Secondary | ICD-10-CM | POA: Diagnosis not present

## 2014-11-02 DIAGNOSIS — I4891 Unspecified atrial fibrillation: Secondary | ICD-10-CM | POA: Diagnosis not present

## 2014-11-06 ENCOUNTER — Telehealth (HOSPITAL_COMMUNITY): Payer: Self-pay

## 2014-11-06 NOTE — Telephone Encounter (Signed)
Has appointment for Thursday and would like to reSchedule

## 2014-11-07 ENCOUNTER — Other Ambulatory Visit: Payer: Self-pay

## 2014-11-07 DIAGNOSIS — R4189 Other symptoms and signs involving cognitive functions and awareness: Secondary | ICD-10-CM | POA: Diagnosis not present

## 2014-11-07 DIAGNOSIS — I447 Left bundle-branch block, unspecified: Secondary | ICD-10-CM | POA: Diagnosis not present

## 2014-11-07 DIAGNOSIS — Z5181 Encounter for therapeutic drug level monitoring: Secondary | ICD-10-CM | POA: Diagnosis not present

## 2014-11-07 DIAGNOSIS — I472 Ventricular tachycardia: Secondary | ICD-10-CM | POA: Diagnosis not present

## 2014-11-07 DIAGNOSIS — I4891 Unspecified atrial fibrillation: Secondary | ICD-10-CM | POA: Diagnosis not present

## 2014-11-07 DIAGNOSIS — I5022 Chronic systolic (congestive) heart failure: Secondary | ICD-10-CM | POA: Diagnosis not present

## 2014-11-07 MED ORDER — DONEPEZIL HCL 23 MG PO TABS: 23.0000 mg | ORAL_TABLET | Freq: Every day | ORAL | Status: DC

## 2014-11-09 ENCOUNTER — Encounter (HOSPITAL_COMMUNITY): Payer: Medicare Other | Admitting: Internal Medicine

## 2014-11-09 ENCOUNTER — Ambulatory Visit (INDEPENDENT_AMBULATORY_CARE_PROVIDER_SITE_OTHER): Payer: Medicare Other | Admitting: Interventional Cardiology

## 2014-11-09 DIAGNOSIS — R4189 Other symptoms and signs involving cognitive functions and awareness: Secondary | ICD-10-CM | POA: Diagnosis not present

## 2014-11-09 DIAGNOSIS — I480 Paroxysmal atrial fibrillation: Secondary | ICD-10-CM

## 2014-11-09 DIAGNOSIS — I472 Ventricular tachycardia: Secondary | ICD-10-CM | POA: Diagnosis not present

## 2014-11-09 DIAGNOSIS — Z5181 Encounter for therapeutic drug level monitoring: Secondary | ICD-10-CM | POA: Diagnosis not present

## 2014-11-09 DIAGNOSIS — I5022 Chronic systolic (congestive) heart failure: Secondary | ICD-10-CM | POA: Diagnosis not present

## 2014-11-09 DIAGNOSIS — I447 Left bundle-branch block, unspecified: Secondary | ICD-10-CM | POA: Diagnosis not present

## 2014-11-09 DIAGNOSIS — Z95810 Presence of automatic (implantable) cardiac defibrillator: Secondary | ICD-10-CM

## 2014-11-09 DIAGNOSIS — I4891 Unspecified atrial fibrillation: Secondary | ICD-10-CM | POA: Diagnosis not present

## 2014-11-09 LAB — POCT INR: INR: 1.9

## 2014-11-14 DIAGNOSIS — I447 Left bundle-branch block, unspecified: Secondary | ICD-10-CM | POA: Diagnosis not present

## 2014-11-14 DIAGNOSIS — Z5181 Encounter for therapeutic drug level monitoring: Secondary | ICD-10-CM | POA: Diagnosis not present

## 2014-11-14 DIAGNOSIS — I5022 Chronic systolic (congestive) heart failure: Secondary | ICD-10-CM | POA: Diagnosis not present

## 2014-11-14 DIAGNOSIS — I472 Ventricular tachycardia: Secondary | ICD-10-CM | POA: Diagnosis not present

## 2014-11-14 DIAGNOSIS — I4891 Unspecified atrial fibrillation: Secondary | ICD-10-CM | POA: Diagnosis not present

## 2014-11-14 DIAGNOSIS — R4189 Other symptoms and signs involving cognitive functions and awareness: Secondary | ICD-10-CM | POA: Diagnosis not present

## 2014-11-15 DIAGNOSIS — I472 Ventricular tachycardia: Secondary | ICD-10-CM | POA: Diagnosis not present

## 2014-11-15 DIAGNOSIS — Z5181 Encounter for therapeutic drug level monitoring: Secondary | ICD-10-CM | POA: Diagnosis not present

## 2014-11-15 DIAGNOSIS — I447 Left bundle-branch block, unspecified: Secondary | ICD-10-CM | POA: Diagnosis not present

## 2014-11-15 DIAGNOSIS — R4189 Other symptoms and signs involving cognitive functions and awareness: Secondary | ICD-10-CM | POA: Diagnosis not present

## 2014-11-15 DIAGNOSIS — I5022 Chronic systolic (congestive) heart failure: Secondary | ICD-10-CM | POA: Diagnosis not present

## 2014-11-15 DIAGNOSIS — I4891 Unspecified atrial fibrillation: Secondary | ICD-10-CM | POA: Diagnosis not present

## 2014-11-16 DIAGNOSIS — I472 Ventricular tachycardia: Secondary | ICD-10-CM | POA: Diagnosis not present

## 2014-11-16 DIAGNOSIS — Z5181 Encounter for therapeutic drug level monitoring: Secondary | ICD-10-CM | POA: Diagnosis not present

## 2014-11-16 DIAGNOSIS — I447 Left bundle-branch block, unspecified: Secondary | ICD-10-CM | POA: Diagnosis not present

## 2014-11-16 DIAGNOSIS — I4891 Unspecified atrial fibrillation: Secondary | ICD-10-CM | POA: Diagnosis not present

## 2014-11-16 DIAGNOSIS — I5022 Chronic systolic (congestive) heart failure: Secondary | ICD-10-CM | POA: Diagnosis not present

## 2014-11-16 DIAGNOSIS — R4189 Other symptoms and signs involving cognitive functions and awareness: Secondary | ICD-10-CM | POA: Diagnosis not present

## 2014-11-21 DIAGNOSIS — I447 Left bundle-branch block, unspecified: Secondary | ICD-10-CM | POA: Diagnosis not present

## 2014-11-21 DIAGNOSIS — R4189 Other symptoms and signs involving cognitive functions and awareness: Secondary | ICD-10-CM | POA: Diagnosis not present

## 2014-11-21 DIAGNOSIS — Z5181 Encounter for therapeutic drug level monitoring: Secondary | ICD-10-CM | POA: Diagnosis not present

## 2014-11-21 DIAGNOSIS — I472 Ventricular tachycardia: Secondary | ICD-10-CM | POA: Diagnosis not present

## 2014-11-21 DIAGNOSIS — I5022 Chronic systolic (congestive) heart failure: Secondary | ICD-10-CM | POA: Diagnosis not present

## 2014-11-21 DIAGNOSIS — I4891 Unspecified atrial fibrillation: Secondary | ICD-10-CM | POA: Diagnosis not present

## 2014-11-22 ENCOUNTER — Emergency Department (HOSPITAL_COMMUNITY): Payer: Medicare Other

## 2014-11-22 ENCOUNTER — Inpatient Hospital Stay (HOSPITAL_COMMUNITY)
Admission: EM | Admit: 2014-11-22 | Discharge: 2014-11-24 | DRG: 690 | Disposition: A | Discharge status: Home Health | Payer: Medicare Other | Attending: Internal Medicine | Admitting: Internal Medicine

## 2014-11-22 ENCOUNTER — Encounter (HOSPITAL_COMMUNITY): Payer: Self-pay

## 2014-11-22 ENCOUNTER — Telehealth: Payer: Self-pay | Admitting: Internal Medicine

## 2014-11-22 DIAGNOSIS — R531 Weakness: Secondary | ICD-10-CM

## 2014-11-22 DIAGNOSIS — B961 Klebsiella pneumoniae [K. pneumoniae] as the cause of diseases classified elsewhere: Secondary | ICD-10-CM | POA: Diagnosis present

## 2014-11-22 DIAGNOSIS — R0902 Hypoxemia: Secondary | ICD-10-CM | POA: Diagnosis not present

## 2014-11-22 DIAGNOSIS — R509 Fever, unspecified: Secondary | ICD-10-CM | POA: Diagnosis not present

## 2014-11-22 DIAGNOSIS — Z954 Presence of other heart-valve replacement: Secondary | ICD-10-CM | POA: Diagnosis not present

## 2014-11-22 DIAGNOSIS — R911 Solitary pulmonary nodule: Secondary | ICD-10-CM | POA: Diagnosis present

## 2014-11-22 DIAGNOSIS — Z8249 Family history of ischemic heart disease and other diseases of the circulatory system: Secondary | ICD-10-CM | POA: Diagnosis not present

## 2014-11-22 DIAGNOSIS — Z952 Presence of prosthetic heart valve: Secondary | ICD-10-CM | POA: Diagnosis not present

## 2014-11-22 DIAGNOSIS — Z79899 Other long term (current) drug therapy: Secondary | ICD-10-CM

## 2014-11-22 DIAGNOSIS — Z7982 Long term (current) use of aspirin: Secondary | ICD-10-CM

## 2014-11-22 DIAGNOSIS — I429 Cardiomyopathy, unspecified: Secondary | ICD-10-CM | POA: Diagnosis present

## 2014-11-22 DIAGNOSIS — N183 Chronic kidney disease, stage 3 (moderate): Secondary | ICD-10-CM | POA: Diagnosis present

## 2014-11-22 DIAGNOSIS — D696 Thrombocytopenia, unspecified: Secondary | ICD-10-CM | POA: Diagnosis present

## 2014-11-22 DIAGNOSIS — R350 Frequency of micturition: Secondary | ICD-10-CM | POA: Diagnosis not present

## 2014-11-22 DIAGNOSIS — Z66 Do not resuscitate: Secondary | ICD-10-CM | POA: Diagnosis present

## 2014-11-22 DIAGNOSIS — Z7901 Long term (current) use of anticoagulants: Secondary | ICD-10-CM

## 2014-11-22 DIAGNOSIS — L8990 Pressure ulcer of unspecified site, unspecified stage: Secondary | ICD-10-CM | POA: Diagnosis present

## 2014-11-22 DIAGNOSIS — I480 Paroxysmal atrial fibrillation: Secondary | ICD-10-CM | POA: Diagnosis not present

## 2014-11-22 DIAGNOSIS — I447 Left bundle-branch block, unspecified: Secondary | ICD-10-CM | POA: Diagnosis present

## 2014-11-22 DIAGNOSIS — I4891 Unspecified atrial fibrillation: Secondary | ICD-10-CM | POA: Diagnosis present

## 2014-11-22 DIAGNOSIS — N390 Urinary tract infection, site not specified: Secondary | ICD-10-CM | POA: Diagnosis not present

## 2014-11-22 DIAGNOSIS — F0390 Unspecified dementia without behavioral disturbance: Secondary | ICD-10-CM | POA: Diagnosis present

## 2014-11-22 DIAGNOSIS — R410 Disorientation, unspecified: Secondary | ICD-10-CM | POA: Diagnosis not present

## 2014-11-22 DIAGNOSIS — I5022 Chronic systolic (congestive) heart failure: Secondary | ICD-10-CM | POA: Diagnosis not present

## 2014-11-22 DIAGNOSIS — R6889 Other general symptoms and signs: Secondary | ICD-10-CM | POA: Diagnosis not present

## 2014-11-22 HISTORY — DX: Unspecified dementia, unspecified severity, without behavioral disturbance, psychotic disturbance, mood disturbance, and anxiety: F03.90

## 2014-11-22 LAB — URINALYSIS, ROUTINE W REFLEX MICROSCOPIC
Bilirubin Urine: NEGATIVE
GLUCOSE, UA: NEGATIVE mg/dL
KETONES UR: NEGATIVE mg/dL
Nitrite: NEGATIVE
PROTEIN: 30 mg/dL — AB
Specific Gravity, Urine: 1.013 (ref 1.005–1.030)
pH: 5 (ref 5.0–8.0)

## 2014-11-22 LAB — CBC WITH DIFFERENTIAL/PLATELET
BASOS ABS: 0 10*3/uL (ref 0.0–0.1)
Basophils Relative: 0 %
EOS ABS: 0 10*3/uL (ref 0.0–0.7)
Eosinophils Relative: 0 %
HCT: 41.5 % (ref 39.0–52.0)
Hemoglobin: 13.6 g/dL (ref 13.0–17.0)
Lymphocytes Relative: 7 %
Lymphs Abs: 1 10*3/uL (ref 0.7–4.0)
MCH: 31.6 pg (ref 26.0–34.0)
MCHC: 32.8 g/dL (ref 30.0–36.0)
MCV: 96.3 fL (ref 78.0–100.0)
MONO ABS: 1.5 10*3/uL — AB (ref 0.1–1.0)
Monocytes Relative: 11 %
NEUTROS PCT: 82 %
Neutro Abs: 11.1 10*3/uL — ABNORMAL HIGH (ref 1.7–7.7)
PLATELETS: 116 10*3/uL — AB (ref 150–400)
RBC: 4.31 MIL/uL (ref 4.22–5.81)
RDW: 16.7 % — ABNORMAL HIGH (ref 11.5–15.5)
WBC: 13.6 10*3/uL — AB (ref 4.0–10.5)

## 2014-11-22 LAB — BASIC METABOLIC PANEL
ANION GAP: 9 (ref 5–15)
BUN: 20 mg/dL (ref 6–20)
CALCIUM: 8.7 mg/dL — AB (ref 8.9–10.3)
CO2: 27 mmol/L (ref 22–32)
CREATININE: 0.84 mg/dL (ref 0.61–1.24)
Chloride: 101 mmol/L (ref 101–111)
Glucose, Bld: 123 mg/dL — ABNORMAL HIGH (ref 65–99)
Potassium: 4.6 mmol/L (ref 3.5–5.1)
SODIUM: 137 mmol/L (ref 135–145)

## 2014-11-22 LAB — URINE MICROSCOPIC-ADD ON

## 2014-11-22 LAB — PROTIME-INR
INR: 2.39 — AB (ref 0.00–1.49)
PROTHROMBIN TIME: 25.8 s — AB (ref 11.6–15.2)

## 2014-11-22 LAB — MAGNESIUM: MAGNESIUM: 1.9 mg/dL (ref 1.7–2.4)

## 2014-11-22 LAB — CG4 I-STAT (LACTIC ACID): Lactic Acid, Venous: 0.83 mmol/L (ref 0.5–2.0)

## 2014-11-22 LAB — I-STAT CG4 LACTIC ACID, ED: LACTIC ACID, VENOUS: 1.27 mmol/L (ref 0.5–2.0)

## 2014-11-22 MED ORDER — ALLOPURINOL 300 MG PO TABS
300.0000 mg | ORAL_TABLET | Freq: Every day | ORAL | Status: DC
  Administered 2014-11-23 – 2014-11-24 (×2): 300 mg via ORAL
  Filled 2014-11-22 (×2): qty 1

## 2014-11-22 MED ORDER — LIDOCAINE HCL (PF) 1 % IJ SOLN
2.0000 mL | Freq: Once | INTRAMUSCULAR | Status: DC
  Filled 2014-11-22: qty 5

## 2014-11-22 MED ORDER — DONEPEZIL HCL 23 MG PO TABS
23.0000 mg | ORAL_TABLET | Freq: Every day | ORAL | Status: DC
  Administered 2014-11-22 – 2014-11-23 (×2): 23 mg via ORAL
  Filled 2014-11-22 (×3): qty 1

## 2014-11-22 MED ORDER — LORAZEPAM 0.5 MG PO TABS
0.5000 mg | ORAL_TABLET | Freq: Three times a day (TID) | ORAL | Status: DC | PRN
  Administered 2014-11-22 – 2014-11-23 (×2): 0.5 mg via ORAL
  Filled 2014-11-22 (×3): qty 1

## 2014-11-22 MED ORDER — WARFARIN SODIUM 2 MG PO TABS
2.0000 mg | ORAL_TABLET | Freq: Once | ORAL | Status: AC
  Administered 2014-11-22: 2 mg via ORAL
  Filled 2014-11-22: qty 1

## 2014-11-22 MED ORDER — CARVEDILOL 3.125 MG PO TABS
3.1250 mg | ORAL_TABLET | Freq: Two times a day (BID) | ORAL | Status: DC
  Administered 2014-11-22 – 2014-11-24 (×4): 3.125 mg via ORAL
  Filled 2014-11-22 (×6): qty 1

## 2014-11-22 MED ORDER — LISINOPRIL 5 MG PO TABS
5.0000 mg | ORAL_TABLET | Freq: Every day | ORAL | Status: DC
Start: 2014-11-22 — End: 2014-11-23
  Administered 2014-11-22: 5 mg via ORAL
  Filled 2014-11-22 (×2): qty 1

## 2014-11-22 MED ORDER — ONDANSETRON HCL 4 MG PO TABS: 4.0000 mg | ORAL_TABLET | Freq: Four times a day (QID) | ORAL | Status: DC | PRN

## 2014-11-22 MED ORDER — ROSUVASTATIN CALCIUM 5 MG PO TABS
5.0000 mg | ORAL_TABLET | Freq: Every day | ORAL | Status: DC
  Administered 2014-11-22 – 2014-11-24 (×3): 5 mg via ORAL
  Filled 2014-11-22 (×3): qty 1

## 2014-11-22 MED ORDER — DEXTROSE 5 % IV SOLN
1.0000 g | Freq: Once | INTRAVENOUS | Status: AC
  Administered 2014-11-22: 1 g via INTRAVENOUS
  Filled 2014-11-22: qty 10

## 2014-11-22 MED ORDER — FLUTICASONE PROPIONATE 50 MCG/ACT NA SUSP
2.0000 | Freq: Every day | NASAL | Status: DC | PRN
  Filled 2014-11-22: qty 16

## 2014-11-22 MED ORDER — MEMANTINE HCL ER 28 MG PO CP24
28.0000 mg | ORAL_CAPSULE | Freq: Every day | ORAL | Status: DC
  Administered 2014-11-22 – 2014-11-24 (×3): 28 mg via ORAL
  Filled 2014-11-22 (×3): qty 1

## 2014-11-22 MED ORDER — ASPIRIN EC 81 MG PO TBEC
81.0000 mg | DELAYED_RELEASE_TABLET | Freq: Every day | ORAL | Status: DC
  Administered 2014-11-22 – 2014-11-24 (×3): 81 mg via ORAL
  Filled 2014-11-22 (×3): qty 1

## 2014-11-22 MED ORDER — TRAZODONE HCL 50 MG PO TABS
350.0000 mg | ORAL_TABLET | Freq: Every day | ORAL | Status: DC
  Administered 2014-11-22 – 2014-11-23 (×2): 350 mg via ORAL
  Filled 2014-11-22 (×3): qty 1

## 2014-11-22 MED ORDER — CEFTRIAXONE SODIUM 1 G IJ SOLR
1.0000 g | Freq: Once | INTRAMUSCULAR | Status: DC
  Filled 2014-11-22: qty 10

## 2014-11-22 MED ORDER — CEFTRIAXONE SODIUM 1 G IJ SOLR
1.0000 g | INTRAMUSCULAR | Status: DC
  Administered 2014-11-23: 1 g via INTRAVENOUS
  Filled 2014-11-22 (×2): qty 10

## 2014-11-22 MED ORDER — SPIRONOLACTONE 12.5 MG HALF TABLET
12.5000 mg | ORAL_TABLET | Freq: Every day | ORAL | Status: DC
  Administered 2014-11-23: 12.5 mg via ORAL
  Filled 2014-11-22: qty 1

## 2014-11-22 MED ORDER — SODIUM CHLORIDE 0.9 % IV BOLUS (SEPSIS)
1000.0000 mL | Freq: Once | INTRAVENOUS | Status: AC
  Administered 2014-11-22: 1000 mL via INTRAVENOUS

## 2014-11-22 MED ORDER — ACETAMINOPHEN 650 MG RE SUPP: 650.0000 mg | Freq: Four times a day (QID) | RECTAL | Status: DC | PRN

## 2014-11-22 MED ORDER — WARFARIN - PHARMACIST DOSING INPATIENT: Freq: Every day | Status: DC

## 2014-11-22 MED ORDER — VITAMIN B-1 100 MG PO TABS
100.0000 mg | ORAL_TABLET | Freq: Two times a day (BID) | ORAL | Status: DC
  Administered 2014-11-22 – 2014-11-24 (×4): 100 mg via ORAL
  Filled 2014-11-22 (×5): qty 1

## 2014-11-22 MED ORDER — AMIODARONE HCL 200 MG PO TABS
200.0000 mg | ORAL_TABLET | Freq: Every day | ORAL | Status: DC
  Administered 2014-11-22 – 2014-11-24 (×3): 200 mg via ORAL
  Filled 2014-11-22 (×3): qty 1

## 2014-11-22 MED ORDER — ONDANSETRON HCL 4 MG/2ML IJ SOLN: 4.0000 mg | Freq: Four times a day (QID) | INTRAMUSCULAR | Status: DC | PRN

## 2014-11-22 MED ORDER — ACETAMINOPHEN 325 MG PO TABS
650.0000 mg | ORAL_TABLET | Freq: Four times a day (QID) | ORAL | Status: DC | PRN
  Administered 2014-11-22: 650 mg via ORAL
  Filled 2014-11-22: qty 2

## 2014-11-22 NOTE — ED Notes (Signed)
He remains in no distress.  He continues to experience urinary frequency.

## 2014-11-22 NOTE — ED Notes (Signed)
Bed: [id number] Expected date:  Expected time:  Means of arrival:  Comments: EMS 79yo URINARY RETENTION

## 2014-11-22 NOTE — H&P (Signed)
[facility] History and Physical  Jon Butler [id number] DOB: [birth year] DOA: 11/22/2014  Referring physician: EDP PCP: [name], MD   Chief Complaint: Urinary frequency  HPI: Jon Butler is a 79 y.o. male with past medical history of dementia, chronic systolic CHF with EF of 123456, paroxysmal atrial fibrillation, history of mechanical aortic valve on warfarin, chronic kidney disease stage III is brought to the ER by his wife with the above complaints. Patient is a poor historian his wife reports increased urinary frequency and urinating small amounts each time for the past 2-3 days she also reports increasing weakness and chills. He has a chronic cough which is productive at night which is reportedly unchanged recently. In the ER, He was febrile up to 101.6, white count is 13.6 platelets 116, UA was abnormal.  Review of Systems:  unable to obtain due to dementia  Constitutional:  No weight loss, night sweats, Fevers, chills, fatigue.  HEENT:  No headaches, Difficulty swallowing,Tooth/dental problems,Sore throat,  No sneezing, itching, ear ache, nasal congestion, post nasal drip,  Cardio-vascular:  No chest pain, Orthopnea, PND, swelling in lower extremities, anasarca, dizziness, palpitations  GI:  No heartburn, indigestion, abdominal pain, nausea, vomiting, diarrhea, change in bowel habits, loss of appetite  Resp:  No shortness of breath with exertion or at rest. No excess mucus, no productive cough, No non-productive cough, No coughing up of blood.No change in color of mucus.No wheezing.No chest wall deformity  Skin:  no rash or lesions.  GU:  no dysuria, change in color of urine, no urgency or frequency. No flank pain.  Musculoskeletal:  No joint pain or swelling. No decreased range of motion. No back pain.  Psych:  No change in mood or affect. No depression or anxiety. No memory loss.   Past Medical History  Diagnosis Date  . Left bundle-branch block     . Cardiomyopathy   . Chronic systolic heart failure ([place])   . Atrial fibrillation ([place])   . Dementia    Past Surgical History  Procedure Laterality Date  . Doppler echocardiography  2008, 2009  . Aortic valve replacement    . Tee without cardioversion N/A 10/15/2012    Procedure: TRANSESOPHAGEAL ECHOCARDIOGRAM (TEE);  Surgeon: Fay Records, MD;  Location: [facility] ENDOSCOPY;  Service: Cardiovascular;  Laterality: N/A;  . Cardioversion N/A 10/25/2012    Procedure: CARDIOVERSION;  Surgeon: Evans Lance, MD;  Location: [place];  Service: Cardiovascular;  Laterality: N/A;  . Cardioversion N/A 12/20/2012    Procedure: CARDIOVERSION;  Surgeon: Larey Dresser, MD;  Location: [place];  Service: Cardiovascular;  Laterality: N/A;  . Implantable cardioverter defibrillator generator change N/A 09/20/2012    Procedure: IMPLANTABLE CARDIOVERTER DEFIBRILLATOR GENERATOR CHANGE;  Surgeon: Evans Lance, MD;  Location: [facility] CATH LAB;  Service: Cardiovascular;  Laterality: N/A;  . Tee without cardioversion N/A 08/10/2014    Procedure: TRANSESOPHAGEAL ECHOCARDIOGRAM (TEE);  Surgeon: Sanda Klein, MD;  Location: [place];  Service: Cardiovascular;  Laterality: N/A;  . Cardioversion N/A 08/10/2014    Procedure: CARDIOVERSION;  Surgeon: Sanda Klein, MD;  Location: [facility] ENDOSCOPY;  Service: Cardiovascular;  Laterality: N/A;  . Esophagogastroduodenoscopy (egd) with propofol N/A 08/18/2014    Procedure: ESOPHAGOGASTRODUODENOSCOPY (EGD) WITH PROPOFOL;  Surgeon: Wonda Horner, MD;  Location: [facility] ENDOSCOPY;  Service: Endoscopy;  Laterality: N/A;  . Botox injection N/A 08/18/2014    Procedure: BOTOX INJECTION;  Surgeon: Wonda Horner, MD;  Location: [facility] ENDOSCOPY;  Service: Endoscopy;  Laterality: N/A;   Social  History:  reports that he has never smoked. He has never used smokeless tobacco. He reports that he drinks alcohol. He reports that he does not use illicit drugs.  No Known Allergies  Family History  Problem  Relation Age of Onset  . Aneurysm Mother     brain  . Heart attack Father 3    Prior to Admission medications   Medication Sig Start Date End Date Taking? Authorizing Provider  allopurinol (ZYLOPRIM) 300 MG tablet Take 300 mg by mouth daily.    Yes Historical Provider, MD  amiodarone (PACERONE) 200 MG tablet Take 1 tablet (200 mg total) by mouth daily. 09/07/14  Yes Jolaine Artist, MD  amoxicillin (AMOXIL) 500 MG capsule Take 500 mg by mouth every other day.    Yes Historical Provider, MD  aspirin EC 81 MG tablet Take 1 tablet (81 mg total) by mouth daily. 10/26/12  Yes Brooke O Edmisten, PA-C  carvedilol (COREG) 3.125 MG tablet Take 3.125 mg by mouth 2 (two) times daily with a meal.   Yes Historical Provider, MD  colchicine 0.6 MG tablet Take 0.6 mg by mouth daily as needed (gout pain).    Yes Historical Provider, MD  donepezil (ARICEPT) 23 MG TABS tablet Take 1 tablet (23 mg total) by mouth at bedtime. 11/07/14  Yes Penni Bombard, MD  fluticasone (FLONASE) 50 MCG/ACT nasal spray Place 2 sprays into both nostrils daily as needed for allergies or rhinitis.  08/17/13  Yes Historical Provider, MD  furosemide (LASIX) 40 MG tablet Take 1 tablet (40 mg total) by mouth daily. Take an extra 40mg  for weight gain. 10/02/14  Yes Shaune Pascal Bensimhon, MD  lisinopril (PRINIVIL,ZESTRIL) 10 MG tablet Take 0.5 tablets (5 mg total) by mouth at bedtime. 07/07/14  Yes Evans Lance, MD  LORazepam (ATIVAN) 0.5 MG tablet Take 1 tablet (0.5 mg total) by mouth every 8 (eight) hours as needed for anxiety. 08/21/14  Yes Eugenie Filler, MD  memantine (NAMENDA XR) 28 MG CP24 24 hr capsule Take 1 capsule (28 mg total) by mouth daily. 04/20/14  Yes Penni Bombard, MD  metolazone (ZAROXOLYN) 2.5 MG tablet Take 1 tablet (2.5 mg total) by mouth daily. 06/21/14  Yes Larey Dresser, MD  potassium chloride SA (K-DUR,KLOR-CON) 20 MEQ tablet Take 1 tablet (20 mEq total) by mouth daily. Take 1 extra tab when you take  metolazone 09/07/14  Yes Jolaine Artist, MD  rosuvastatin (CRESTOR) 10 MG tablet Take 5 mg by mouth daily.    Yes Historical Provider, MD  spironolactone (ALDACTONE) 25 MG tablet Take 0.5 tablets (12.5 mg total) by mouth daily. 10/27/12  Yes Evans Lance, MD  thiamine (VITAMIN B-1) 100 MG tablet Take 1 tablet (100 mg total) by mouth 2 (two) times daily. 12/16/12  Yes Amy D Clegg, NP  traZODone (DESYREL) 100 MG tablet Take 350 mg by mouth at bedtime.  06/29/14  Yes Historical Provider, MD  traZODone (DESYREL) 150 MG tablet Take 350 mg by mouth at bedtime. 09/13/14  Yes Historical Provider, MD  warfarin (COUMADIN) 2 MG tablet Take as directed by coumadin clinic Patient taking differently: Take 2-3 mg by mouth daily at 6 PM. Take 2 mg daily except on Tuesday and Saturday. Take 3 mg on Tuesday and Saturday 01/05/14  Yes Evans Lance, MD   Physical Exam: Filed Vitals:   11/22/14 1206 11/22/14 1247 11/22/14 1353 11/22/14 1706  BP:  118/72 122/67 110/69  Pulse:  86 75 84  Temp: 100.3 F (37.9 C) 99.2 F (37.3 C) 100.6 F (38.1 C) 101.6 F (38.7 C)  TempSrc: Rectal Oral Rectal Oral  Resp:  18 18 18   SpO2:  91% 92% 100%    Wt Readings from Last 3 Encounters:  09/28/14 84.823 kg (187 lb)  09/07/14 89.132 kg (196 lb 8 oz)  08/18/14 91.873 kg (202 lb 8.7 oz)    General:Elderly, average built male, sitting in his hospital bed, no distress, oriented to self and place only, his face is flushed es: PERRL, normal lids, irises & conjunctiva ENT: grossly normal lips & tongue Neck: no LAD, masses or thyromegaly Cardiovascular: RRR, no m/r/g. . Telemetry: SR, no arrhythmias  RespiratoryPoor air movement bilaterally, normal respiratory effort. AbdomenSoft nontender, nondistended, bowel sounds presentin: no rash or induration seen on limited exam Musculoskeletal: grossly normal tone BUE/BLE trace edema ,  Psychiatric: grossly normal mood and affect Neurologic: grossly non-focal.          Labs  on Admission:  Basic Metabolic Panel:  Recent Labs Lab 11/22/14 1444  NA 137  K 4.6  CL 101  CO2 27  GLUCOSE 123*  BUN 20  CREATININE 0.84  CALCIUM 8.7*   Liver Function Tests: No results for input(s): AST, ALT, ALKPHOS, BILITOT, PROT, ALBUMIN in the last 168 hours. No results for input(s): LIPASE, AMYLASE in the last 168 hours. No results for input(s): AMMONIA in the last 168 hours. CBC:  Recent Labs Lab 11/22/14 1444  WBC 13.6*  NEUTROABS 11.1*  HGB 13.6  HCT 41.5  MCV 96.3  PLT 116*   Cardiac Enzymes: No results for input(s): CKTOTAL, CKMB, CKMBINDEX, TROPONINI in the last 168 hours.  BNP (last 3 results)  Recent Labs  08/09/14 0745  BNP 2479.2*    ProBNP (last 3 results) No results for input(s): PROBNP in the last 8760 hours.  CBG: No results for input(s): GLUCAP in the last 168 hours.  Radiological Exams on Admission: Dg Chest 2 View  11/22/2014  CLINICAL DATA:  Urinary frequency, decreased urinary output, chills for 2 days, hypoxia, fever, UTI, personal history of cardiomyopathy, chronic systolic heart failure, atrial fibrillation EXAM: CHEST  2 VIEW COMPARISON:  08/11/2014 FINDINGS: LEFT subclavian AICD leads project at RIGHT atrium, RIGHT ventricle and coronary sinus. Enlargement of cardiac silhouette post median sternotomy. Atherosclerotic calcification aorta. Slight pulmonary vascular congestion. Ovoid nodular density lateral mid RIGHT chest centered at minor fissure question pseudotumor, nodule or external artifact. No definite acute infiltrate, pleural effusion or pneumothorax. Bones demineralized. IMPRESSION: Enlargement of cardiac silhouette post median sternotomy and AICD. Questionable nodular density lateral mid RIGHT chest centered at minor fissure, question pseudotumor or pulmonary nodule; this is unchanged since an earlier study of 08/08/2014. Recommend follow ups chest radiographs in 3 months to assess stability. Electronically Signed   By: Lavonia Dana M.D.   On: 11/22/2014 14:51    EKG: Independently reviewed. NSR,   Assessment/Plan Active Problems:   Urinary tract infection -No history of BPH or any other prostate problems -IV ceftriaxone, gentle IV fluids, follow-up urine culture -Will need urology follow-up to determine prostate versus bladder problems  Chronic systolic CHF/EF 123456 -Clinically compensated will hold Lasix and Zaroxolyn today, continue Coreg  Mechanical aortic valve -Continue Coumadin per pharmacy  Paroxysmal atrial fibrillation -normal sinus rhythm at this time, continue Coumadin and Coreg  Chronic kidney disease stage III -Creatinine stable at baseline  Chronic thrombocytopenia -Stable, monitor  Pulmonary nodule noted on chest x-ray -Nonsmoker,  unchanged from former  x-ray 08/08/2014 -Recommend follow-up imaging in 3 months to reassess  Dementia -Continue Aricept and Namenda  Code Status: DNR per wife, she showed me a [place] signed DNR form DVT Prophylaxis: lovenox Family Communication: wife at bedside Disposition Plan: inpatient  Time spent: 48min  Afrika Brick [facility] Pager [phone]

## 2014-11-22 NOTE — Progress Notes (Signed)
ANTICOAGULATION CONSULT NOTE - Initial Consult  Pharmacy Consult for Warfarin Indication: PAF, AVR  No Known Allergies  Patient Measurements:   Heparin Dosing Weight:   Vital Signs: Temp: 101.6 F (38.7 C) (11/16 1706) Temp Source: Oral (11/16 1706) BP: 110/69 mmHg (11/16 1706) Pulse Rate: 84 (11/16 1706)  Labs:  Recent Labs  11/22/14 1444  HGB 13.6  HCT 41.5  PLT 116*  CREATININE 0.84    CrCl cannot be calculated (Unknown ideal weight.).   Medical History: Past Medical History  Diagnosis Date  . Left bundle-branch block   . Cardiomyopathy   . Chronic systolic heart failure ([place])   . Atrial fibrillation ([place])   . Dementia     Medications:  Warfarin 2mg  daily except 3mg  on Tuesday / Saturday  ASA 81mg  daily  Assessment: 39 yoM on chronic warfarin for hx Afib and mechanical AVR ([employer]), also hx dementia, CHF (EF 20%), and CKD-III presents with urinary frequency.  Pharmacy consulted to continue warfarin dosing inpatient.    Home dose warfarin noted above, however according to EPIC notes, discrepancy in dosing as last regimen changed 11/09/14 to 2mg  daily except 3mg  three days a week (Sun, Tues, and Fri).    11/16 INR 2.39, therapeutic.  Hx thrombocytopenia, appears at baseline.  Hgb WNL.  No bleeding noted per chart.  Cardiac diet.  Taking amiodarone and ASA chronically.    Goal of Therapy:  INR 2-3 Monitor platelets by anticoagulation protocol: Yes   Plan:  Warfarin 2mg  po x 1 tonight  Daily PT/INR  Ralene Bathe, PharmD, BCPS 11/22/2014, 7:13 PM  Pager: [phone]   .

## 2014-11-22 NOTE — Telephone Encounter (Signed)
Spoke with wife who is reporting pt has not voided in 2 days.   She states she knows he has not urinated because he wears Depends and it's not been wet.  Pt has never had anything like this before.  Does not have a urologist.  Has been taking medications including diuretics as listed.  He does not have any cp, SOB, edema or wt gain.  Advised wife pt needs to been seen in the ED or an UrgentCare to be cathed and determine why he is unable to void.  She states they do not want to go to the ED.  Advised we do not cath pts in the office.  She can check with pt's PCP to see if they can do it but that he needs to be seen ASAP.  Advised again to report to ED or UrgentCare for cath and further evaluation.  She stated understanding.

## 2014-11-22 NOTE — ED Notes (Signed)
Note: will re-do bladder scan with a different machine shortly.

## 2014-11-22 NOTE — ED Provider Notes (Signed)
CSN: [id number]     Arrival date & time 11/22/14  1142 History   First MD Initiated Contact with Patient 11/22/14 1221     Chief Complaint  Patient presents with  . Urinary Frequency     (Consider location/radiation/quality/duration/timing/severity/associated sxs/prior Treatment) HPI Comments: 79yo M w/ PMH including dementia, A fib, sCHF, LBBB who p/w urinary frequency. History obtained with the assistance of the patient's wife. She states that the patient has had 2 days of increased urinary frequency with low urine output. The patient denies any dysuria or hematuria. He denies any feelings of bladder distention or abdominal pain. He denies any back pain, fevers, vomiting, or other illness. He has otherwise been comfortable at home however wife reports that he has had some increased confusion recently. No history of kidney stones or prostate problems. No cough/cold symptoms. No recent travel, history of cancer, or history of blood clots.  Wife later noted to the patient's nurse that he has been generally weak more so than normal and unsteady on his feet.  Patient is a 79 y.o. male presenting with frequency. The history is provided by the patient and the spouse.  Urinary Frequency    Past Medical History  Diagnosis Date  . Left bundle-branch block   . Cardiomyopathy   . Chronic systolic heart failure ([place])   . Atrial fibrillation [facility])    Past Surgical History  Procedure Laterality Date  . Doppler echocardiography  2008, 2009  . Aortic valve replacement    . Tee without cardioversion N/A 10/15/2012    Procedure: TRANSESOPHAGEAL ECHOCARDIOGRAM (TEE);  Surgeon: Fay Records, MD;  Location: [facility] ENDOSCOPY;  Service: Cardiovascular;  Laterality: N/A;  . Cardioversion N/A 10/25/2012    Procedure: CARDIOVERSION;  Surgeon: Evans Lance, MD;  Location: [place];  Service: Cardiovascular;  Laterality: N/A;  . Cardioversion N/A 12/20/2012    Procedure: CARDIOVERSION;  Surgeon: Larey Dresser,  MD;  Location: [place];  Service: Cardiovascular;  Laterality: N/A;  . Implantable cardioverter defibrillator generator change N/A 09/20/2012    Procedure: IMPLANTABLE CARDIOVERTER DEFIBRILLATOR GENERATOR CHANGE;  Surgeon: Evans Lance, MD;  Location: [facility] CATH LAB;  Service: Cardiovascular;  Laterality: N/A;  . Tee without cardioversion N/A 08/10/2014    Procedure: TRANSESOPHAGEAL ECHOCARDIOGRAM (TEE);  Surgeon: Sanda Klein, MD;  Location: [place];  Service: Cardiovascular;  Laterality: N/A;  . Cardioversion N/A 08/10/2014    Procedure: CARDIOVERSION;  Surgeon: Sanda Klein, MD;  Location: [facility] ENDOSCOPY;  Service: Cardiovascular;  Laterality: N/A;  . Esophagogastroduodenoscopy (egd) with propofol N/A 08/18/2014    Procedure: ESOPHAGOGASTRODUODENOSCOPY (EGD) WITH PROPOFOL;  Surgeon: Wonda Horner, MD;  Location: [facility] ENDOSCOPY;  Service: Endoscopy;  Laterality: N/A;  . Botox injection N/A 08/18/2014    Procedure: BOTOX INJECTION;  Surgeon: Wonda Horner, MD;  Location: [facility] ENDOSCOPY;  Service: Endoscopy;  Laterality: N/A;   Family History  Problem Relation Age of Onset  . Aneurysm Mother     brain  . Heart attack Father 68   Social History  Substance Use Topics  . Smoking status: Never Smoker   . Smokeless tobacco: Never Used  . Alcohol Use: 0.0 oz/week    2-3 Glasses of wine per week     Comment: 2-3 glasses    Review of Systems  Genitourinary: Positive for frequency.   10 Systems reviewed and are negative for acute change except as noted in the HPI.    Allergies  Review of patient's allergies indicates no known allergies.  Home  Medications   Prior to Admission medications   Medication Sig Start Date End Date Taking? Authorizing Provider  allopurinol (ZYLOPRIM) 300 MG tablet Take 300 mg by mouth daily.    Yes Historical Provider, MD  amiodarone (PACERONE) 200 MG tablet Take 1 tablet (200 mg total) by mouth daily. 09/07/14  Yes Jolaine Artist, MD  amoxicillin (AMOXIL)  500 MG capsule Take 500 mg by mouth every other day.    Yes Historical Provider, MD  aspirin EC 81 MG tablet Take 1 tablet (81 mg total) by mouth daily. 10/26/12  Yes Brooke O Edmisten, PA-C  carvedilol (COREG) 3.125 MG tablet Take 3.125 mg by mouth 2 (two) times daily with a meal.   Yes Historical Provider, MD  colchicine 0.6 MG tablet Take 0.6 mg by mouth daily as needed (gout pain).    Yes Historical Provider, MD  donepezil (ARICEPT) 23 MG TABS tablet Take 1 tablet (23 mg total) by mouth at bedtime. 11/07/14  Yes Penni Bombard, MD  fluticasone (FLONASE) 50 MCG/ACT nasal spray Place 2 sprays into both nostrils daily as needed for allergies or rhinitis.  08/17/13  Yes Historical Provider, MD  furosemide (LASIX) 40 MG tablet Take 1 tablet (40 mg total) by mouth daily. Take an extra 40mg  for weight gain. 10/02/14  Yes Shaune Pascal Bensimhon, MD  lisinopril (PRINIVIL,ZESTRIL) 10 MG tablet Take 0.5 tablets (5 mg total) by mouth at bedtime. 07/07/14  Yes Evans Lance, MD  LORazepam (ATIVAN) 0.5 MG tablet Take 1 tablet (0.5 mg total) by mouth every 8 (eight) hours as needed for anxiety. 08/21/14  Yes Eugenie Filler, MD  memantine (NAMENDA XR) 28 MG CP24 24 hr capsule Take 1 capsule (28 mg total) by mouth daily. 04/20/14  Yes Penni Bombard, MD  metolazone (ZAROXOLYN) 2.5 MG tablet Take 1 tablet (2.5 mg total) by mouth daily. 06/21/14  Yes Larey Dresser, MD  potassium chloride SA (K-DUR,KLOR-CON) 20 MEQ tablet Take 1 tablet (20 mEq total) by mouth daily. Take 1 extra tab when you take metolazone 09/07/14  Yes Jolaine Artist, MD  rosuvastatin (CRESTOR) 10 MG tablet Take 5 mg by mouth daily.    Yes Historical Provider, MD  spironolactone (ALDACTONE) 25 MG tablet Take 0.5 tablets (12.5 mg total) by mouth daily. 10/27/12  Yes Evans Lance, MD  thiamine (VITAMIN B-1) 100 MG tablet Take 1 tablet (100 mg total) by mouth 2 (two) times daily. 12/16/12  Yes Amy D Clegg, NP  traZODone (DESYREL) 100 MG tablet  Take 350 mg by mouth at bedtime.  06/29/14  Yes Historical Provider, MD  traZODone (DESYREL) 150 MG tablet Take 350 mg by mouth at bedtime. 09/13/14  Yes Historical Provider, MD  warfarin (COUMADIN) 2 MG tablet Take as directed by coumadin clinic Patient taking differently: Take 2-3 mg by mouth daily at 6 PM. Take 2 mg daily except on Tuesday and Saturday. Take 3 mg on Tuesday and Saturday 01/05/14  Yes Evans Lance, MD   BP 122/67 mmHg  Pulse 75  Temp(Src) 100.6 F (38.1 C) (Rectal)  Resp 18  SpO2 92% Physical Exam  Constitutional: He appears well-developed and well-nourished. No distress.  HENT:  Head: Normocephalic and atraumatic.  Moist mucous membranes  Eyes: Conjunctivae are normal. Pupils are equal, round, and reactive to light.  Neck: Neck supple.  Cardiovascular: Normal rate, regular rhythm and normal heart sounds.   No murmur heard. Pulmonary/Chest: Effort normal and breath sounds normal.  Abdominal: Soft.  Bowel sounds are normal. He exhibits no distension. There is no tenderness.  Musculoskeletal: He exhibits no edema.  Neurological:  Oriented to person and place, slightly confused but able to follow basic commands  Skin: Skin is warm and dry.  Psychiatric:  Calm, cooperative  Nursing note and vitals reviewed.   ED Course  Procedures (including critical care time) Labs Review Labs Reviewed  URINALYSIS, ROUTINE W REFLEX MICROSCOPIC (NOT AT [facility]) - Abnormal; Notable for the following:    APPearance CLOUDY (*)    Hgb urine dipstick LARGE (*)    Protein, ur 30 (*)    Leukocytes, UA MODERATE (*)    All other components within normal limits  URINE MICROSCOPIC-ADD ON - Abnormal; Notable for the following:    Squamous Epithelial / LPF 0-5 (*)    Bacteria, UA FEW (*)    Casts HYALINE CASTS (*)    All other components within normal limits  BASIC METABOLIC PANEL - Abnormal; Notable for the following:    Glucose, Bld 123 (*)    Calcium 8.7 (*)    All other components  within normal limits  CBC WITH DIFFERENTIAL/PLATELET - Abnormal; Notable for the following:    WBC 13.6 (*)    RDW 16.7 (*)    Platelets 116 (*)    Neutro Abs 11.1 (*)    Monocytes Absolute 1.5 (*)    All other components within normal limits  URINE CULTURE  CULTURE, BLOOD (ROUTINE X 2)  CULTURE, BLOOD (ROUTINE X 2)  PROTIME-INR  I-STAT CG4 LACTIC ACID, ED    Imaging Review Dg Chest 2 View  11/22/2014  CLINICAL DATA:  Urinary frequency, decreased urinary output, chills for 2 days, hypoxia, fever, UTI, personal history of cardiomyopathy, chronic systolic heart failure, atrial fibrillation EXAM: CHEST  2 VIEW COMPARISON:  08/11/2014 FINDINGS: LEFT subclavian AICD leads project at RIGHT atrium, RIGHT ventricle and coronary sinus. Enlargement of cardiac silhouette post median sternotomy. Atherosclerotic calcification aorta. Slight pulmonary vascular congestion. Ovoid nodular density lateral mid RIGHT chest centered at minor fissure question pseudotumor, nodule or external artifact. No definite acute infiltrate, pleural effusion or pneumothorax. Bones demineralized. IMPRESSION: Enlargement of cardiac silhouette post median sternotomy and AICD. Questionable nodular density lateral mid RIGHT chest centered at minor fissure, question pseudotumor or pulmonary nodule; this is unchanged since an earlier study of 08/08/2014. Recommend follow ups chest radiographs in 3 months to assess stability. Electronically Signed   By: Lavonia Dana M.D.   On: 11/22/2014 14:51   I have personally reviewed and evaluated these lab results as part of my medical decision-making.   EKG Interpretation None     Medications  sodium chloride 0.9 % bolus 1,000 mL (0 mLs Intravenous Stopped 11/22/14 1648)  cefTRIAXone (ROCEPHIN) 1 g in dextrose 5 % 50 mL IVPB (0 g Intravenous Stopped 11/22/14 1649)    MDM   Final diagnoses:  UTI (lower urinary tract infection)  Weakness  Confusion  Hypoxia   79yo M w/ dementia who  p/w 2 days of urinary frequency without dysuria or hematuria. Wife reports confusion and generalized weakness particularly with ambulation. On arrival, patient awake, alert, comfortable and in no acute distress. Vital signs notable for O2 sat 92-94% on room air. He was initially afebrile but later developed fever of 100.6. No abdominal tenderness on exam and bedside ultrasound showed decompressed bladder. A cause of fever, obtained above labs and obtained chest x-ray given her mild hypoxia. Chest x-ray showed a possible nodule but no acute infiltrate.  Labs notable for UA consistent with infection; blood and urine cultures sent. Gave the patient an IV fluid bolus as well as ceftriaxone. I am concerned about his fever, confusion, and weakness in the setting of UTI. I discussed admission with the patient's wife who is in agreement. Discussed case with [facility] hospitalist and noted hypoxia and possible need for future CT scan if hypoxia persists. Pt admitted to general medicine for further care.    Sharlett Iles, MD 11/22/14 [phone]

## 2014-11-22 NOTE — Progress Notes (Signed)
Patient had a run of ventricular tachycardia.  Dr. Broadus John notified, with request to give coreg, and check magnesium level.

## 2014-11-22 NOTE — ED Notes (Signed)
Per his family; pt. Has had urinary frequency with diminution of urinary output, plus occasional chills x 2 days.  He is in no distress.

## 2014-11-22 NOTE — Telephone Encounter (Signed)
New message      Daughter states that the pt has not peed in 2 days.  Bp is 150/77 and HR is 116.  She also states he has chills

## 2014-11-23 DIAGNOSIS — N390 Urinary tract infection, site not specified: Principal | ICD-10-CM

## 2014-11-23 DIAGNOSIS — L8990 Pressure ulcer of unspecified site, unspecified stage: Secondary | ICD-10-CM

## 2014-11-23 LAB — CBC
HCT: 37.1 % — ABNORMAL LOW (ref 39.0–52.0)
HEMOGLOBIN: 11.9 g/dL — AB (ref 13.0–17.0)
MCH: 31.2 pg (ref 26.0–34.0)
MCHC: 32.1 g/dL (ref 30.0–36.0)
MCV: 97.1 fL (ref 78.0–100.0)
Platelets: 90 10*3/uL — ABNORMAL LOW (ref 150–400)
RBC: 3.82 MIL/uL — AB (ref 4.22–5.81)
RDW: 17.2 % — ABNORMAL HIGH (ref 11.5–15.5)
WBC: 12.3 10*3/uL — ABNORMAL HIGH (ref 4.0–10.5)

## 2014-11-23 LAB — BASIC METABOLIC PANEL
Anion gap: 5 (ref 5–15)
BUN: 25 mg/dL — AB (ref 6–20)
CHLORIDE: 104 mmol/L (ref 101–111)
CO2: 30 mmol/L (ref 22–32)
CREATININE: 0.89 mg/dL (ref 0.61–1.24)
Calcium: 8.4 mg/dL — ABNORMAL LOW (ref 8.9–10.3)
GFR calc Af Amer: >60 mL/min (ref >60)
GFR calc non Af Amer: >60 mL/min (ref >60)
GLUCOSE: 113 mg/dL — AB (ref 65–99)
POTASSIUM: 4.2 mmol/L (ref 3.5–5.1)
SODIUM: 139 mmol/L (ref 135–145)

## 2014-11-23 LAB — PROTIME-INR
INR: 2.6 — ABNORMAL HIGH (ref 0.00–1.49)
PROTHROMBIN TIME: 27.5 s — AB (ref 11.6–15.2)

## 2014-11-23 MED ORDER — WARFARIN SODIUM 2 MG PO TABS
2.0000 mg | ORAL_TABLET | Freq: Once | ORAL | Status: AC
  Administered 2014-11-23: 2 mg via ORAL
  Filled 2014-11-23: qty 1

## 2014-11-23 MED ORDER — SODIUM CHLORIDE 0.9 % IV SOLN
INTRAVENOUS | Status: DC
  Administered 2014-11-23: 11:00:00 via INTRAVENOUS

## 2014-11-23 NOTE — Progress Notes (Signed)
[facility] PROGRESS NOTE  KEITH STARN [id number] DOB: 1935-12-19 DOA: 11/22/2014 PCP: [name], MD  Assessment/Plan: Urinary tract infection -No history of BPH or any other prostate problems -Continue IV ceftriaxone, start IV fluids due to poor urine output-may need Foley if retaining -follow-up urine culture -Will need urology follow-up to determine prostate versus bladder problems  Chronic systolic CHF/EF 123456 -Clinically compensated will hold Lasix and Zaroxolyn, continue Coreg -gentle IVF today  Mechanical aortic valve -Continue Coumadin per pharmacy  Paroxysmal atrial fibrillation -normal sinus rhythm at this time, continue Coumadin and Coreg  Chronic kidney disease stage III -Creatinine stable at baseline  Chronic thrombocytopenia -down further, monitor  Pulmonary nodule noted on chest x-ray -Nonsmoker, unchanged from former x-ray 08/08/2014 -Recommend follow-up imaging in 3 months to reassess  Dementia -Continue Aricept and Namenda  DVT Prophylaxis: on warfarin  Code Status: DNR per wife, she showed me a [place] signed DNR form Family Communication: wife at bedside Disposition Plan: home ? Tomorrow if stable    HPI/Subjective: Has not urinated since last night  Objective: Filed Vitals:   11/23/14 0503  BP: 103/60  Pulse: 63  Temp: 98.9 F (37.2 C)  Resp: 18   No intake or output data in the 24 hours ending 11/23/14 1056 Filed Weights   11/22/14 1754  Weight: 83.2 kg (183 lb 6.8 oz)    Exam:   General:  AAOx to self, place  Cardiovascular:S1S2/RRR  Respiratory: CTAB  Abdomen: soft, NT, BS present  Musculoskeletal: no edema c/c   Data Reviewed: Basic Metabolic Panel:  Recent Labs Lab 11/22/14 1444 11/23/14 0610  NA 137 139  K 4.6 4.2  CL 101 104  CO2 27 30  GLUCOSE 123* 113*  BUN 20 25*  CREATININE 0.84 0.89  CALCIUM 8.7* 8.4*  MG 1.9  --    Liver Function Tests: No results for input(s): AST, ALT,  ALKPHOS, BILITOT, PROT, ALBUMIN in the last 168 hours. No results for input(s): LIPASE, AMYLASE in the last 168 hours. No results for input(s): AMMONIA in the last 168 hours. CBC:  Recent Labs Lab 11/22/14 1444 11/23/14 0610  WBC 13.6* 12.3*  NEUTROABS 11.1*  --   HGB 13.6 11.9*  HCT 41.5 37.1*  MCV 96.3 97.1  PLT 116* 90*   Cardiac Enzymes: No results for input(s): CKTOTAL, CKMB, CKMBINDEX, TROPONINI in the last 168 hours. BNP (last 3 results)  Recent Labs  08/09/14 0745  BNP 2479.2*    ProBNP (last 3 results) No results for input(s): PROBNP in the last 8760 hours.  CBG: No results for input(s): GLUCAP in the last 168 hours.  Recent Results (from the past 240 hour(s))  Urine culture     Status: None (Preliminary result)   Collection Time: 11/22/14  2:48 PM  Result Value Ref Range Status   Specimen Description URINE, CLEAN CATCH  Final   Special Requests NONE  Final   Culture PENDING  Incomplete   Report Status PENDING  Incomplete     Studies: Dg Chest 2 View  11/22/2014  CLINICAL DATA:  Urinary frequency, decreased urinary output, chills for 2 days, hypoxia, fever, UTI, personal history of cardiomyopathy, chronic systolic heart failure, atrial fibrillation EXAM: CHEST  2 VIEW COMPARISON:  08/11/2014 FINDINGS: LEFT subclavian AICD leads project at RIGHT atrium, RIGHT ventricle and coronary sinus. Enlargement of cardiac silhouette post median sternotomy. Atherosclerotic calcification aorta. Slight pulmonary vascular congestion. Ovoid nodular density lateral mid RIGHT chest centered at minor fissure question pseudotumor, nodule or external  artifact. No definite acute infiltrate, pleural effusion or pneumothorax. Bones demineralized. IMPRESSION: Enlargement of cardiac silhouette post median sternotomy and AICD. Questionable nodular density lateral mid RIGHT chest centered at minor fissure, question pseudotumor or pulmonary nodule; this is unchanged since an earlier study of  08/08/2014. Recommend follow ups chest radiographs in 3 months to assess stability. Electronically Signed   By: Lavonia Dana M.D.   On: 11/22/2014 14:51    Scheduled Meds: . allopurinol  300 mg Oral Daily  . amiodarone  200 mg Oral Daily  . aspirin EC  81 mg Oral Daily  . carvedilol  3.125 mg Oral BID WC  . cefTRIAXone (ROCEPHIN)  IV  1 g Intravenous Q24H  . donepezil  23 mg Oral QHS  . memantine  28 mg Oral Daily  . rosuvastatin  5 mg Oral Daily  . thiamine  100 mg Oral BID  . traZODone  350 mg Oral QHS  . warfarin  2 mg Oral ONCE-1800  . Warfarin - Pharmacist Dosing Inpatient   Does not apply q1800   Continuous Infusions: . sodium chloride 75 mL/hr at 11/23/14 1045   Antibiotics Given (last 72 hours)    None      Active Problems:   Chronic systolic heart failure (H[facility])   Atrial fibrillation ([place])   Long term current use of anticoagulant therapy   History of mechanical aortic valve replacement   Urinary tract infection in elderly patient   UTI (urinary tract infection)   Lung nodule   Pressure ulcer    Time spent: 31min    Kenzy Campoverde  [facility] Pager [phone]. If 7PM-7AM, please contact night-coverage at [email or web address], password [facility] 11/23/2014, 10:56 AM  LOS: 1 day

## 2014-11-23 NOTE — Progress Notes (Signed)
ANTICOAGULATION CONSULT NOTE - Follow-Up Consult  Pharmacy Consult for Warfarin Indication: PAF, AVR  No Known Allergies  Patient Measurements: Height: 5\' 9"  (175.3 cm) Weight: 183 lb 6.8 oz (83.2 kg) IBW/kg (Calculated) : 70.7  Vital Signs: Temp: 98.9 F (37.2 C) (11/17 0503) Temp Source: Oral (11/17 0503) BP: 103/60 mmHg (11/17 0503) Pulse Rate: 63 (11/17 0503)  Labs:  Recent Labs  11/22/14 1444 11/22/14 1800 11/23/14 0610  HGB 13.6  --  11.9*  HCT 41.5  --  37.1*  PLT 116*  --  90*  LABPROT  --  25.8* 27.5*  INR  --  2.39* 2.60*  CREATININE 0.84  --  0.89    Estimated Creatinine Clearance: 69.5 mL/min (by C-G formula based on Cr of 0.89).   Medical History: Past Medical History  Diagnosis Date  . Left bundle-branch block   . Cardiomyopathy   . Chronic systolic heart failure ([place])   . Atrial fibrillation ([place])   . Dementia     Assessment: 68 yoM on chronic warfarin for hx Afib and mechanical AVR ([employer]), also hx dementia, CHF (EF 20%), and CKD-III presents with urinary frequency.  Pharmacy consulted to continue warfarin dosing inpatient.    Home dose warfarin clarified with EPIC records and family member who helps patient with medications at home: 2mg  daily except 3mg  three days a week (Sun, Tues, and Fri)  Today, 11/23/2014:  INR remains therapeutic at 2.6  Hgb decreased to 11.9, Plt decreased to 90 (appears to be chronically low)  No bleeding issues reported/documented  Heart healthy diet  Drug interactions: Taking amiodarone, allopurinol, and ASA chronically.    Goal of Therapy:  INR 2-3 Monitor platelets by anticoagulation protocol: Yes   Plan:  Warfarin 2mg  PO x 1 tonight per home regimen  Daily PT/INR Monitor for s/s of bleeding.   Lindell Spar, PharmD, BCPS Pager: [phone] 11/23/2014 10:00 AM     .

## 2014-11-23 NOTE — Care Management Note (Signed)
Case Management Note  Patient Details  Name: Jon Butler MRN: [id number] Date of Birth: 1935-03-18  Subjective/Objective:     uti wth systemic infection               Action/Plan:Date: November 23, 2014 Chart reviewed for concurrent status and case management needs. Will continue to follow patient for changes and needs: Velva Harman, RN, BSN, [place]   [phone]   Expected Discharge Date:   (unknown)               Expected Discharge Plan:  Home/Self Care  In-House Referral:  NA  Discharge planning Services  CM Consult  Post Acute Care Choice:  NA Choice offered to:  NA  DME Arranged:    DME Agency:     HH Arranged:    HH Agency:     Status of Service:  In process, will continue to follow  Medicare Important Message Given:    Date Medicare IM Given:    Medicare IM give by:    Date Additional Medicare IM Given:    Additional Medicare Important Message give by:     If discussed at [place] of Stay Meetings, dates discussed:    Additional Comments:  Leeroy Cha, RN 11/23/2014, 10:47 AM

## 2014-11-24 DIAGNOSIS — R0902 Hypoxemia: Secondary | ICD-10-CM

## 2014-11-24 DIAGNOSIS — I480 Paroxysmal atrial fibrillation: Secondary | ICD-10-CM

## 2014-11-24 LAB — URINE CULTURE

## 2014-11-24 LAB — BASIC METABOLIC PANEL
ANION GAP: 6 (ref 5–15)
BUN: 37 mg/dL — ABNORMAL HIGH (ref 6–20)
CHLORIDE: 105 mmol/L (ref 101–111)
CO2: 27 mmol/L (ref 22–32)
Calcium: 8 mg/dL — ABNORMAL LOW (ref 8.9–10.3)
Creatinine, Ser: 1.08 mg/dL (ref 0.61–1.24)
GFR calc Af Amer: >60 mL/min (ref >60)
GLUCOSE: 110 mg/dL — AB (ref 65–99)
POTASSIUM: 4.4 mmol/L (ref 3.5–5.1)
Sodium: 138 mmol/L (ref 135–145)

## 2014-11-24 LAB — CBC
HEMATOCRIT: 35.9 % — AB (ref 39.0–52.0)
HEMOGLOBIN: 11.6 g/dL — AB (ref 13.0–17.0)
MCH: 31.5 pg (ref 26.0–34.0)
MCHC: 32.3 g/dL (ref 30.0–36.0)
MCV: 97.6 fL (ref 78.0–100.0)
PLATELETS: 95 10*3/uL — AB (ref 150–400)
RBC: 3.68 MIL/uL — AB (ref 4.22–5.81)
RDW: 17.3 % — ABNORMAL HIGH (ref 11.5–15.5)
WBC: 9.6 10*3/uL (ref 4.0–10.5)

## 2014-11-24 LAB — PROTIME-INR
INR: 2.64 — ABNORMAL HIGH (ref 0.00–1.49)
Prothrombin Time: 27.8 seconds — ABNORMAL HIGH (ref 11.6–15.2)

## 2014-11-24 MED ORDER — CEPHALEXIN 250 MG PO CAPS: 250.0000 mg | ORAL_CAPSULE | Freq: Four times a day (QID) | ORAL | Status: DC

## 2014-11-24 MED ORDER — AMOXICILLIN 500 MG PO CAPS: 500.0000 mg | ORAL_CAPSULE | ORAL | Status: DC

## 2014-11-24 MED ORDER — WARFARIN SODIUM 2 MG PO TABS: 2.0000 mg | ORAL_TABLET | Freq: Every day | ORAL | Status: DC

## 2014-11-24 MED ORDER — WARFARIN SODIUM 2.5 MG PO TABS
2.5000 mg | ORAL_TABLET | Freq: Once | ORAL | Status: DC
  Filled 2014-11-24: qty 1

## 2014-11-24 MED ORDER — METOLAZONE 2.5 MG PO TABS: 2.5000 mg | ORAL_TABLET | Freq: Every day | ORAL | Status: DC

## 2014-11-24 NOTE — Discharge Summary (Signed)
Physician Discharge Summary  Jon Butler [id number] DOB: 1935/07/30 DOA: 11/22/2014  PCP: [name], MD  Admit date: 11/22/2014 Discharge date: 11/24/2014  Time spent: 45 minutes  Recommendations for Outpatient Follow-up:  1. PCP in 1 week, please check INR at FU 2. Has a Pulmonary nodule that needs FU imaging in 3 months 3. FU with Urology in 1 month   Discharge Diagnoses:    Klebsiella UTI   Chronic systolic heart failure   P Atrial fibrillation    Long term current use of anticoagulant therapy   History of mechanical aortic valve replacement   Urinary tract infection in elderly patient   UTI (urinary tract infection)   Lung nodule   Pressure ulcer   Hypoxia   Discharge Condition: stable  Diet recommendation: heart healthy  Filed Weights   11/22/14 1754  Weight: 83.2 kg (183 lb 6.8 oz)    History of present illness:  Chief Complaint: Urinary frequency HPI: Jon Butler is a 79 y.o. male with past medical history of dementia, chronic systolic CHF with EF of 123456, paroxysmal atrial fibrillation, history of mechanical aortic valve on warfarin, chronic kidney disease stage III was brought to the ER by his wife with the above complaints. Patient is a poor historian his wife reports increased urinary frequency and urinating small amounts each time for the past 2-3 days she also reported increasing weakness and chills. He has a chronic cough which is productive at night which is reportedly unchanged recently. In the ER, He was febrile up to 101.6, white count is 13.6 platelets 116, UA was abnormal  Hospital Course:  Klebsiella Urinary tract infection -No history of BPH or any other prostate problems -Treated with IV ceftriaxone, gentle IVF -Urine culture grew Klebsiella, resistant to Ampicillin and resistant to cephalosporins, changed to PO Keflex at discharge -Will need urology follow-up to determine prostate versus bladder problems  Chronic systolic CHF/EF  123456 -Clinically compensated, resumed Lasix 40mg  daily and PRN Zaroxolyn for weight gain -continue Coreg  Mechanical aortic valve -Continued on Coumadin, INR 2.6 at discharge -will resume prior regimen of Coumadin  Paroxysmal atrial fibrillation -normal sinus rhythm at this time, continue Coumadin and Coreg  Chronic kidney disease stage III -Creatinine stable at baseline  Chronic thrombocytopenia -down further, monitor  Pulmonary nodule noted on chest x-ray -Nonsmoker, unchanged from former x-ray 08/08/2014 -Recommend follow-up imaging in 3 months to reassess  Dementia -Continue Aricept and Namenda Discharge Exam: Filed Vitals:   11/24/14 1216  BP: 106/63  Pulse: 60  Temp: 98.8 F (37.1 C)  Resp: 19    General: AAOx3 Cardiovascular: S1S2/RRR Respiratory: CTAB  Discharge Instructions   Discharge Instructions    Diet - low sodium heart healthy    Complete by:  As directed      Increase activity slowly    Complete by:  As directed           Current Discharge Medication List    START taking these medications   Details  cephALEXin (KEFLEX) 250 MG capsule Take 1 capsule (250 mg total) by mouth 4 (four) times daily. For 4 days Qty: 16 capsule, Refills: 0      CONTINUE these medications which have CHANGED   Details  amoxicillin (AMOXIL) 500 MG capsule Take 1 capsule (500 mg total) by mouth every other day. Restart after Antibiotic for UTI completed    metolazone (ZAROXOLYN) 2.5 MG tablet Take 1 tablet (2.5 mg total) by mouth daily. As needed for weight gain  warfarin (COUMADIN) 2 MG tablet Take 1-1.5 tablets (2-3 mg total) by mouth daily at 6 PM. Take 2 mg (1 tablet) daily except on Sunday, Tuesday, and Friday. Take 3 mg (1.5 tablets) on Sunday, Tuesday, and Friday      CONTINUE these medications which have NOT CHANGED   Details  allopurinol (ZYLOPRIM) 300 MG tablet Take 300 mg by mouth daily.     amiodarone (PACERONE) 200 MG tablet Take 1 tablet (200 mg  total) by mouth daily. Qty: 30 tablet, Refills: 3    aspirin EC 81 MG tablet Take 1 tablet (81 mg total) by mouth daily.    carvedilol (COREG) 3.125 MG tablet Take 3.125 mg by mouth 2 (two) times daily with a meal.    colchicine 0.6 MG tablet Take 0.6 mg by mouth daily as needed (gout pain).     donepezil (ARICEPT) 23 MG TABS tablet Take 1 tablet (23 mg total) by mouth at bedtime. Qty: 30 tablet, Refills: 3    fluticasone (FLONASE) 50 MCG/ACT nasal spray Place 2 sprays into both nostrils daily as needed for allergies or rhinitis.     furosemide (LASIX) 40 MG tablet Take 1 tablet (40 mg total) by mouth daily. Take an extra 40mg  for weight gain. Qty: 60 tablet, Refills: 3    lisinopril (PRINIVIL,ZESTRIL) 10 MG tablet Take 0.5 tablets (5 mg total) by mouth at bedtime. Qty: 30 tablet, Refills: 4    LORazepam (ATIVAN) 0.5 MG tablet Take 1 tablet (0.5 mg total) by mouth every 8 (eight) hours as needed for anxiety. Qty: 20 tablet, Refills: 0    memantine (NAMENDA XR) 28 MG CP24 24 hr capsule Take 1 capsule (28 mg total) by mouth daily. Qty: 30 capsule, Refills: 6    potassium chloride SA (K-DUR,KLOR-CON) 20 MEQ tablet Take 1 tablet (20 mEq total) by mouth daily. Take 1 extra tab when you take metolazone Qty: 30 tablet, Refills: 3    rosuvastatin (CRESTOR) 10 MG tablet Take 5 mg by mouth daily.     spironolactone (ALDACTONE) 25 MG tablet Take 0.5 tablets (12.5 mg total) by mouth daily. Qty: 30 tablet, Refills: 3    thiamine (VITAMIN B-1) 100 MG tablet Take 1 tablet (100 mg total) by mouth 2 (two) times daily. Qty: 60 tablet, Refills: 6    !! traZODone (DESYREL) 100 MG tablet Take 350 mg by mouth at bedtime.  Refills: 2    !! traZODone (DESYREL) 150 MG tablet Take 350 mg by mouth at bedtime. Refills: 2     !! - Potential duplicate medications found. Please discuss with provider.     No Known Allergies    The results of significant diagnostics from this hospitalization  (including imaging, microbiology, ancillary and laboratory) are listed below for reference.    Significant Diagnostic Studies: Dg Chest 2 View  11/22/2014  CLINICAL DATA:  Urinary frequency, decreased urinary output, chills for 2 days, hypoxia, fever, UTI, personal history of cardiomyopathy, chronic systolic heart failure, atrial fibrillation EXAM: CHEST  2 VIEW COMPARISON:  08/11/2014 FINDINGS: LEFT subclavian AICD leads project at RIGHT atrium, RIGHT ventricle and coronary sinus. Enlargement of cardiac silhouette post median sternotomy. Atherosclerotic calcification aorta. Slight pulmonary vascular congestion. Ovoid nodular density lateral mid RIGHT chest centered at minor fissure question pseudotumor, nodule or external artifact. No definite acute infiltrate, pleural effusion or pneumothorax. Bones demineralized. IMPRESSION: Enlargement of cardiac silhouette post median sternotomy and AICD. Questionable nodular density lateral mid RIGHT chest centered at minor fissure, question pseudotumor or  pulmonary nodule; this is unchanged since an earlier study of 08/08/2014. Recommend follow ups chest radiographs in 3 months to assess stability. Electronically Signed   By: Lavonia Dana M.D.   On: 11/22/2014 14:51    Microbiology: Recent Results (from the past 240 hour(s))  Culture, blood (routine x 2)     Status: None (Preliminary result)   Collection Time: 11/22/14  2:30 PM  Result Value Ref Range Status   Specimen Description BLOOD RIGHT FOREARM  Final   Special Requests BOTTLES DRAWN AEROBIC AND ANAEROBIC 5ML  Final   Culture   Final    NO GROWTH 2 DAYS Performed at [facility]    Report Status PENDING  Incomplete  Culture, blood (routine x 2)     Status: None (Preliminary result)   Collection Time: 11/22/14  2:44 PM  Result Value Ref Range Status   Specimen Description BLOOD LEFT HAND  Final   Special Requests BOTTLES DRAWN AEROBIC AND ANAEROBIC 5ML  Final   Culture   Final    NO GROWTH  2 DAYS Performed at [facility]    Report Status PENDING  Incomplete  Urine culture     Status: None   Collection Time: 11/22/14  2:48 PM  Result Value Ref Range Status   Specimen Description URINE, CLEAN CATCH  Final   Special Requests NONE  Final   Culture   Final    >=100,000 COLONIES/mL KLEBSIELLA PNEUMONIAE Performed at [facility]    Report Status 11/24/2014 FINAL  Final   Organism ID, Bacteria KLEBSIELLA PNEUMONIAE  Final      Susceptibility   Klebsiella pneumoniae - MIC*    AMPICILLIN >=32 RESISTANT Resistant     CEFAZOLIN <=4 SENSITIVE Sensitive     CEFTRIAXONE <=1 SENSITIVE Sensitive     CIPROFLOXACIN <=0.25 SENSITIVE Sensitive     GENTAMICIN <=1 SENSITIVE Sensitive     IMIPENEM <=0.25 SENSITIVE Sensitive     NITROFURANTOIN <=16 SENSITIVE Sensitive     TRIMETH/SULFA <=20 SENSITIVE Sensitive     AMPICILLIN/SULBACTAM >=32 RESISTANT Resistant     PIP/TAZO 16 SENSITIVE Sensitive     * >=100,000 COLONIES/mL KLEBSIELLA PNEUMONIAE     Labs: Basic Metabolic Panel:  Recent Labs Lab 11/22/14 1444 11/23/14 0610 11/24/14 0450  NA 137 139 138  K 4.6 4.2 4.4  CL 101 104 105  CO2 27 30 27   GLUCOSE 123* 113* 110*  BUN 20 25* 37*  CREATININE 0.84 0.89 1.08  CALCIUM 8.7* 8.4* 8.0*  MG 1.9  --   --    Liver Function Tests: No results for input(s): AST, ALT, ALKPHOS, BILITOT, PROT, ALBUMIN in the last 168 hours. No results for input(s): LIPASE, AMYLASE in the last 168 hours. No results for input(s): AMMONIA in the last 168 hours. CBC:  Recent Labs Lab 11/22/14 1444 11/23/14 0610 11/24/14 0450  WBC 13.6* 12.3* 9.6  NEUTROABS 11.1*  --   --   HGB 13.6 11.9* 11.6*  HCT 41.5 37.1* 35.9*  MCV 96.3 97.1 97.6  PLT 116* 90* 95*   Cardiac Enzymes: No results for input(s): CKTOTAL, CKMB, CKMBINDEX, TROPONINI in the last 168 hours. BNP: BNP (last 3 results)  Recent Labs  08/09/14 0745  BNP 2479.2*    ProBNP (last 3 results) No results for  input(s): PROBNP in the last 8760 hours.  CBG: No results for input(s): GLUCAP in the last 168 hours.     SignedDomenic Polite  [facility] 11/24/2014, 1:36 PM

## 2014-11-24 NOTE — Evaluation (Signed)
Physical Therapy Evaluation Patient Details Name: Jon Butler MRN: [id number] DOB: 1935/07/04 Today's Date: 11/24/2014   History of Present Illness  Pt admitted with UTI and hx of A-FiB, dementia, peripheral neuropathy and CHF.  Pt's spouse states he has been followed at home by [facility] services  Clinical Impression  Pt admitted as above and presents with functional mobility limitations 2* generalized weakness, ambulatory balance deficits, and lack of safety awareness associated with cognitive deficits.  Pt would benefit from follow up HHPT to maximize IND and safety at home.    Follow Up Recommendations Home health PT    Equipment Recommendations  None recommended by PT    Recommendations for Other Services       Precautions / Restrictions Precautions Precautions: Fall Precaution Comments: Pt is impulsive and with hx of dementia Restrictions Weight Bearing Restrictions: No      Mobility  Bed Mobility Overal bed mobility: Needs Assistance Bed Mobility: Sit to Supine       Sit to supine: Min guard      Transfers Overall transfer level: Needs assistance Equipment used: None Transfers: Sit to/from Stand Sit to Stand: Min assist         General transfer comment: cues for transition position and use of UEs to self assist.  Physical assist to rise and initially steady on feet  Ambulation/Gait Ambulation/Gait assistance: Min assist;Min guard Ambulation Distance (Feet): 140 Feet Assistive device: Rolling walker (2 wheeled) Gait Pattern/deviations: Step-through pattern;Shuffle;Trunk flexed     General Gait Details: cues for saftey awareness, posture and position from RW  Stairs Stairs: Yes Stairs assistance: Min assist Stair Management: One rail Right;Forwards Number of Stairs: 2 General stair comments: cues for foot placement  Wheelchair Mobility    Modified Rankin (Stroke Patients Only)       Balance Overall balance assessment: Needs  assistance Sitting-balance support: Feet supported Sitting balance-Leahy Scale: Good     Standing balance support: No upper extremity supported Standing balance-Leahy Scale: Fair                               Pertinent Vitals/Pain Pain Assessment: No/denies pain    Home Living Family/patient expects to be discharged to:: Private residence Living Arrangements: Spouse/significant other Available Help at Discharge: Family;Available 24 hours/day Type of Home: House Home Access: Stairs to enter Entrance Stairs-Rails: Right Entrance Stairs-Number of Steps: 2 Home Layout: One level Home Equipment: Walker - 2 wheels;Bedside commode;Shower seat      Prior Function Level of Independence: Needs assistance   Gait / Transfers Assistance Needed: Pt's spouse states pt was transitioning to QC with HHPT but should have stayed with RW  ADL's / Homemaking Assistance Needed: patient reports independence with BADLs; son confirms but states "it took a lot of time"        Hand Dominance        Extremity/Trunk Assessment   Upper Extremity Assessment: Generalized weakness           Lower Extremity Assessment: Generalized weakness         Communication   Communication: No difficulties  Cognition Arousal/Alertness: Awake/alert Behavior During Therapy: WFL for tasks assessed/performed Overall Cognitive Status: History of cognitive impairments - at baseline       Memory: Decreased short-term memory              General Comments      Exercises  Assessment/Plan    PT Assessment All further PT needs can be met in the next venue of care  PT Diagnosis Difficulty walking;Altered mental status   PT Problem List Decreased strength;Decreased activity tolerance;Decreased balance;Decreased mobility;Decreased cognition;Decreased knowledge of use of DME  PT Treatment Interventions     PT Goals (Current goals can be found in the Care Plan section) Acute Rehab  PT Goals Patient Stated Goal: Home PT Goal Formulation: With patient/family Time For Goal Achievement: 11/29/14    Frequency     Barriers to discharge        Co-evaluation               End of Session Equipment Utilized During Treatment: Gait belt Activity Tolerance: Patient tolerated treatment well Patient left: in bed;with call bell/phone within reach;with family/visitor present Nurse Communication: Mobility status         Time: 8315-1761 PT Time Calculation (min) (ACUTE ONLY): 14 min   Charges:   PT Evaluation $Initial PT Evaluation Tier I: 1 Procedure     PT G Codes:        Laurieanne Galloway 11/29/2014, 4:45 PM

## 2014-11-24 NOTE — Progress Notes (Signed)
Jon Butler to be D/C'd Home per MD order.  Discussed prescriptions and follow up appointments with the patient. Prescriptions given to patient, medication list explained in detail. Pt verbalized understanding.    Medication List    TAKE these medications        allopurinol 300 MG tablet  Commonly known as:  ZYLOPRIM  Take 300 mg by mouth daily.     amiodarone 200 MG tablet  Commonly known as:  PACERONE  Take 1 tablet (200 mg total) by mouth daily.     amoxicillin 500 MG capsule  Commonly known as:  AMOXIL  Take 1 capsule (500 mg total) by mouth every other day. Restart after Antibiotic for UTI completed     aspirin EC 81 MG tablet  Take 1 tablet (81 mg total) by mouth daily.     carvedilol 3.125 MG tablet  Commonly known as:  COREG  Take 3.125 mg by mouth 2 (two) times daily with a meal.     cephALEXin 250 MG capsule  Commonly known as:  KEFLEX  Take 1 capsule (250 mg total) by mouth 4 (four) times daily. For 4 days     colchicine 0.6 MG tablet  Take 0.6 mg by mouth daily as needed (gout pain).     donepezil 23 MG Tabs tablet  Commonly known as:  ARICEPT  Take 1 tablet (23 mg total) by mouth at bedtime.     fluticasone 50 MCG/ACT nasal spray  Commonly known as:  FLONASE  Place 2 sprays into both nostrils daily as needed for allergies or rhinitis.     furosemide 40 MG tablet  Commonly known as:  LASIX  Take 1 tablet (40 mg total) by mouth daily. Take an extra 40mg  for weight gain.     lisinopril 10 MG tablet  Commonly known as:  PRINIVIL,ZESTRIL  Take 0.5 tablets (5 mg total) by mouth at bedtime.     LORazepam 0.5 MG tablet  Commonly known as:  ATIVAN  Take 1 tablet (0.5 mg total) by mouth every 8 (eight) hours as needed for anxiety.     memantine 28 MG Cp24 24 hr capsule  Commonly known as:  NAMENDA XR  Take 1 capsule (28 mg total) by mouth daily.     metolazone 2.5 MG tablet  Commonly known as:  ZAROXOLYN  Take 1 tablet (2.5 mg total) by mouth daily.  As needed for weight gain     potassium chloride SA 20 MEQ tablet  Commonly known as:  K-DUR,KLOR-CON  Take 1 tablet (20 mEq total) by mouth daily. Take 1 extra tab when you take metolazone     rosuvastatin 10 MG tablet  Commonly known as:  CRESTOR  Take 5 mg by mouth daily.     spironolactone 25 MG tablet  Commonly known as:  ALDACTONE  Take 0.5 tablets (12.5 mg total) by mouth daily.     thiamine 100 MG tablet  Commonly known as:  VITAMIN B-1  Take 1 tablet (100 mg total) by mouth 2 (two) times daily.     traZODone 100 MG tablet  Commonly known as:  DESYREL  Take 350 mg by mouth at bedtime.     traZODone 150 MG tablet  Commonly known as:  DESYREL  Take 350 mg by mouth at bedtime.     warfarin 2 MG tablet  Commonly known as:  COUMADIN  Take 1-1.5 tablets (2-3 mg total) by mouth daily at 6 PM. Take 2 mg (1  tablet) daily except on Sunday, Tuesday, and Friday. Take 3 mg (1.5 tablets) on Sunday, Tuesday, and Friday        Filed Vitals:   11/24/14 1410  BP: 100/71  Pulse: 60  Temp: 98.9 F (37.2 C)  Resp: 19    Skin clean, dry and intact without evidence of skin break down, no evidence of skin tears noted. IV catheter discontinued intact. Site without signs and symptoms of complications. Dressing and pressure applied. Pt denies pain at this time. No complaints noted.  An After Visit Summary was printed and given to the patient. Patient escorted via [place], and D/C home via private auto.  Nonie Hoyer S 11/24/2014 4:22 PM

## 2014-11-24 NOTE — Progress Notes (Signed)
ANTICOAGULATION CONSULT NOTE - Follow-Up Consult  Pharmacy Consult for Warfarin Indication: PAF, AVR  No Known Allergies  Patient Measurements: Height: 5\' 9"  (175.3 cm) Weight: 183 lb 6.8 oz (83.2 kg) IBW/kg (Calculated) : 70.7  Vital Signs: Temp: 99 F (37.2 C) (11/18 0729) Temp Source: Oral (11/18 0729) BP: 109/62 mmHg (11/18 0729) Pulse Rate: 76 (11/18 0729)  Labs:  Recent Labs  11/22/14 1444 11/22/14 1800 11/23/14 0610 11/24/14 0450  HGB 13.6  --  11.9* 11.6*  HCT 41.5  --  37.1* 35.9*  PLT 116*  --  90* 95*  LABPROT  --  25.8* 27.5* 27.8*  INR  --  2.39* 2.60* 2.64*  CREATININE 0.84  --  0.89 1.08    Estimated Creatinine Clearance: 57.3 mL/min (by C-G formula based on Cr of 1.08).   Medical History: Past Medical History  Diagnosis Date  . Left bundle-branch block   . Cardiomyopathy   . Chronic systolic heart failure ([place])   . Atrial fibrillation ([place])   . Dementia     Assessment: 31 yoM on chronic warfarin for hx Afib and mechanical AVR ([employer]), also hx dementia, CHF (EF 20%), and CKD-III presents with urinary frequency.  Pharmacy consulted to continue warfarin dosing inpatient.    Home dose warfarin clarified with EPIC records and family member who helps patient with medications at home: 2mg  daily except 3mg  three days a week (Sun, Tues, and Fri)  Today, 11/24/2014:  INR remains therapeutic at 2.64  Hgb decreased to 11.9, Plt  95 (appears to be chronically low)  No bleeding issues reported/documented  Heart healthy diet  Drug interactions: Taking amiodarone, allopurinol, and ASA chronically.    No new medications start  Goal of Therapy:  INR 2-3 Monitor platelets by anticoagulation protocol: Yes   Plan:   Warfarin 2.5 mg PO x 1  Daily PT/INR  Monitor for s/s of bleeding.   Royetta Asal, PharmD, BCPS Pager: [phone] 11/24/2014 8:11 AM      .

## 2014-11-24 NOTE — Care Management Important Message (Signed)
Important Message  Patient Details  Name: JAYZ MAU MRN: [id number] Date of Birth: 04-05-1935   Medicare Important Message Given:  Yes    Shelda Altes 11/24/2014, 12:54 [place] Message  Patient Details  Name: PATTRICK RAMIRES MRN: [id number] Date of Birth: January 20, 1935   Medicare Important Message Given:  Yes    Shelda Altes 11/24/2014, 12:54 PM

## 2014-11-27 ENCOUNTER — Telehealth (HOSPITAL_COMMUNITY): Payer: Self-pay

## 2014-11-27 LAB — CULTURE, BLOOD (ROUTINE X 2)
CULTURE: NO GROWTH
Culture: NO GROWTH

## 2014-11-27 NOTE — Telephone Encounter (Signed)
Inpatient last week [facility]. UTI Three bags IV fluids, Laisx held for 3 days DC 11/18 weight at up up to 197.8 Started him back on Lasix 11/18 Weight still up on Saturday gave him Metolazone 2.5 mg Weight down to 194.2 lbs today Will give him an additional Lasix 40 mg today as ordered prn If weight is not going down will give him a Metolazone 2.5 mg on Tuesday. Will call Wednesday with weight if no improvement

## 2014-11-27 NOTE — Telephone Encounter (Signed)
Forward to Dr.Bensimhon 

## 2014-11-28 DIAGNOSIS — I472 Ventricular tachycardia: Secondary | ICD-10-CM | POA: Diagnosis not present

## 2014-11-28 DIAGNOSIS — Z5181 Encounter for therapeutic drug level monitoring: Secondary | ICD-10-CM | POA: Diagnosis not present

## 2014-11-28 DIAGNOSIS — R4189 Other symptoms and signs involving cognitive functions and awareness: Secondary | ICD-10-CM | POA: Diagnosis not present

## 2014-11-28 DIAGNOSIS — I5022 Chronic systolic (congestive) heart failure: Secondary | ICD-10-CM | POA: Diagnosis not present

## 2014-11-28 DIAGNOSIS — I4891 Unspecified atrial fibrillation: Secondary | ICD-10-CM | POA: Diagnosis not present

## 2014-11-28 DIAGNOSIS — I447 Left bundle-branch block, unspecified: Secondary | ICD-10-CM | POA: Diagnosis not present

## 2014-11-28 NOTE — Telephone Encounter (Signed)
Wife stated his weight is down a little more today; can't get me the exact # right now

## 2014-11-29 ENCOUNTER — Ambulatory Visit (INDEPENDENT_AMBULATORY_CARE_PROVIDER_SITE_OTHER): Payer: Medicare Other | Admitting: Pharmacist

## 2014-11-29 DIAGNOSIS — Z95810 Presence of automatic (implantable) cardiac defibrillator: Secondary | ICD-10-CM

## 2014-11-29 DIAGNOSIS — Z5181 Encounter for therapeutic drug level monitoring: Secondary | ICD-10-CM

## 2014-11-29 DIAGNOSIS — I4891 Unspecified atrial fibrillation: Secondary | ICD-10-CM

## 2014-11-29 DIAGNOSIS — Z7901 Long term (current) use of anticoagulants: Secondary | ICD-10-CM | POA: Diagnosis not present

## 2014-11-29 DIAGNOSIS — I480 Paroxysmal atrial fibrillation: Secondary | ICD-10-CM

## 2014-11-29 LAB — POCT INR: INR: 2.5

## 2014-12-04 DIAGNOSIS — N390 Urinary tract infection, site not specified: Secondary | ICD-10-CM | POA: Diagnosis not present

## 2014-12-05 ENCOUNTER — Encounter: Payer: Self-pay | Admitting: Internal Medicine

## 2014-12-05 ENCOUNTER — Ambulatory Visit (INDEPENDENT_AMBULATORY_CARE_PROVIDER_SITE_OTHER): Payer: Medicare Other | Admitting: Internal Medicine

## 2014-12-05 VITALS — BP 90/58 | HR 81 | Ht 69.0 in | Wt 188.0 lb

## 2014-12-05 DIAGNOSIS — I4892 Unspecified atrial flutter: Secondary | ICD-10-CM | POA: Diagnosis not present

## 2014-12-05 DIAGNOSIS — I480 Paroxysmal atrial fibrillation: Secondary | ICD-10-CM | POA: Diagnosis not present

## 2014-12-05 DIAGNOSIS — I429 Cardiomyopathy, unspecified: Secondary | ICD-10-CM

## 2014-12-05 DIAGNOSIS — I472 Ventricular tachycardia, unspecified: Secondary | ICD-10-CM

## 2014-12-05 DIAGNOSIS — Z95810 Presence of automatic (implantable) cardiac defibrillator: Secondary | ICD-10-CM | POA: Diagnosis not present

## 2014-12-05 DIAGNOSIS — R4189 Other symptoms and signs involving cognitive functions and awareness: Secondary | ICD-10-CM | POA: Diagnosis not present

## 2014-12-05 DIAGNOSIS — I5022 Chronic systolic (congestive) heart failure: Secondary | ICD-10-CM

## 2014-12-05 NOTE — Assessment & Plan Note (Signed)
His symptoms are class 2. He will continue his current meds. I have encouraged him to avoid ETOH.

## 2014-12-05 NOTE — Assessment & Plan Note (Signed)
He has reverted back to atrial flutter. His INR has been therapeutic. I have paced him back to NSR today.

## 2014-12-05 NOTE — Assessment & Plan Note (Signed)
He seems more alert today. No change in his meds.

## 2014-12-05 NOTE — Patient Instructions (Signed)
Medication Instructions:  Your physician recommends that you continue on your current medications as directed. Please refer to the Current Medication list given to you today.   Labwork: N/A  Testing/Procedures: N/A  Follow-Up: Your physician wants you to follow-up in: 12 months with Dr. Lovena Le.  You will receive a reminder letter in the mail two months in advance. If you don't receive a letter, please call our office to schedule the follow-up appointment.  Remote monitoring is used to monitor your ICD from home. This monitoring reduces the number of office visits required to check your device to one time per year. It allows [place] to keep an eye on the functioning of your device to ensure it is working properly. You are scheduled for a device check from home on 03/06/15. You may send your transmission at any time that day. If you have a wireless device, the transmission will be sent automatically. After your physician reviews your transmission, you will receive a postcard with your next transmission date.   Any Other Special Instructions Will Be Listed Below (If Applicable).     If you need a refill on your cardiac medications before your next appointment, please call your pharmacy.

## 2014-12-05 NOTE — Assessment & Plan Note (Signed)
His medtronic BiV device is working normally. Will recheck in several months.

## 2014-12-05 NOTE — Progress Notes (Signed)
5      HPI [name] returns today for arrhythmia followup. He is a very pleasant 79 year old man with a nonischemic cardiomyopathy, chronic systolic heart failure , status post aortic valve replacement , and atrial fibrillation and flutter.  He denies noncompliance. Previously he had a history of noncompliance along with alcohol abuse. He denies alcohol abuse as well though I suspect he is still drinking.  He is been maintained on amiodarone 200 mg daily. His tachy therapies have been turned off.  No Known Allergies   Current Outpatient Prescriptions  Medication Sig Dispense Refill  . allopurinol (ZYLOPRIM) 300 MG tablet Take 300 mg by mouth daily.     Marland Kitchen amiodarone (PACERONE) 200 MG tablet Take 1 tablet (200 mg total) by mouth daily. 30 tablet 3  . amoxicillin (AMOXIL) 500 MG capsule Take 1 capsule (500 mg total) by mouth every other day. Restart after Antibiotic for UTI completed    . aspirin EC 81 MG tablet Take 1 tablet (81 mg total) by mouth daily.    . carvedilol (COREG) 3.125 MG tablet Take 3.125 mg by mouth 2 (two) times daily with a meal.    . colchicine 0.6 MG tablet Take 0.6 mg by mouth daily as needed (gout pain).     Marland Kitchen donepezil (ARICEPT) 23 MG TABS tablet Take 1 tablet (23 mg total) by mouth at bedtime. 30 tablet 3  . fluticasone (FLONASE) 50 MCG/ACT nasal spray Place 2 sprays into both nostrils daily as needed for allergies or rhinitis.     . furosemide (LASIX) 40 MG tablet Take 1 tablet (40 mg total) by mouth daily. Take an extra 40mg  for weight gain. 60 tablet 3  . lisinopril (PRINIVIL,ZESTRIL) 10 MG tablet Take 0.5 tablets (5 mg total) by mouth at bedtime. 30 tablet 4  . LORazepam (ATIVAN) 0.5 MG tablet Take 1 tablet (0.5 mg total) by mouth every 8 (eight) hours as needed for anxiety. 20 tablet 0  . memantine (NAMENDA XR) 28 MG CP24 24 hr capsule Take 1 capsule (28 mg total) by mouth daily. 30 capsule 6  . metolazone (ZAROXOLYN) 2.5 MG tablet Take 1 tablet (2.5 mg total)  by mouth daily. As needed for weight gain    . potassium chloride SA (K-DUR,KLOR-CON) 20 MEQ tablet Take 1 tablet (20 mEq total) by mouth daily. Take 1 extra tab when you take metolazone 30 tablet 3  . rosuvastatin (CRESTOR) 10 MG tablet Take 5 mg by mouth daily.     Marland Kitchen spironolactone (ALDACTONE) 25 MG tablet Take 0.5 tablets (12.5 mg total) by mouth daily. 30 tablet 3  . thiamine (VITAMIN B-1) 100 MG tablet Take 1 tablet (100 mg total) by mouth 2 (two) times daily. 60 tablet 6  . traZODone (DESYREL) 100 MG tablet Take 400 mg by mouth at bedtime.   2  . traZODone (DESYREL) 150 MG tablet Take 400 mg by mouth at bedtime. Takes along with a 100 mg tablet  2  . warfarin (COUMADIN) 2 MG tablet Take 1-1.5 tablets (2-3 mg total) by mouth daily at 6 PM. Take 2 mg (1 tablet) daily except on Sunday, Tuesday, and Friday. Take 3 mg (1.5 tablets) on Sunday, Tuesday, and Friday     No current facility-administered medications for this visit.     Past Medical History  Diagnosis Date  . Left bundle-branch block   . Cardiomyopathy   . Chronic systolic heart failure ([place])   . Atrial fibrillation ([place])   . Dementia  ROS:   All systems reviewed and negative except as noted in the HPI.   Past Surgical History  Procedure Laterality Date  . Doppler echocardiography  2008, 2009  . Aortic valve replacement    . Tee without cardioversion N/A 10/15/2012    Procedure: TRANSESOPHAGEAL ECHOCARDIOGRAM (TEE);  Surgeon: Fay Records, MD;  Location: [facility] ENDOSCOPY;  Service: Cardiovascular;  Laterality: N/A;  . Cardioversion N/A 10/25/2012    Procedure: CARDIOVERSION;  Surgeon: Evans Lance, MD;  Location: [place];  Service: Cardiovascular;  Laterality: N/A;  . Cardioversion N/A 12/20/2012    Procedure: CARDIOVERSION;  Surgeon: Larey Dresser, MD;  Location: [place];  Service: Cardiovascular;  Laterality: N/A;  . Implantable cardioverter defibrillator generator change N/A 09/20/2012    Procedure:  IMPLANTABLE CARDIOVERTER DEFIBRILLATOR GENERATOR CHANGE;  Surgeon: Evans Lance, MD;  Location: [facility] CATH LAB;  Service: Cardiovascular;  Laterality: N/A;  . Tee without cardioversion N/A 08/10/2014    Procedure: TRANSESOPHAGEAL ECHOCARDIOGRAM (TEE);  Surgeon: Sanda Klein, MD;  Location: [place];  Service: Cardiovascular;  Laterality: N/A;  . Cardioversion N/A 08/10/2014    Procedure: CARDIOVERSION;  Surgeon: Sanda Klein, MD;  Location: [facility] ENDOSCOPY;  Service: Cardiovascular;  Laterality: N/A;  . Esophagogastroduodenoscopy (egd) with propofol N/A 08/18/2014    Procedure: ESOPHAGOGASTRODUODENOSCOPY (EGD) WITH PROPOFOL;  Surgeon: Wonda Horner, MD;  Location: [facility] ENDOSCOPY;  Service: Endoscopy;  Laterality: N/A;  . Botox injection N/A 08/18/2014    Procedure: BOTOX INJECTION;  Surgeon: Wonda Horner, MD;  Location: [facility] ENDOSCOPY;  Service: Endoscopy;  Laterality: N/A;     Family History  Problem Relation Age of Onset  . Aneurysm Mother     brain  . Heart attack Father 62     Social History   Social History  . Marital Status: Married    Spouse Name: Jon Butler  . Number of Children: 5  . Years of Education: 12   Occupational History  . retired    Social History Main Topics  . Smoking status: Never Smoker   . Smokeless tobacco: Never Used  . Alcohol Use: 0.0 oz/week    2-3 Glasses of wine per week     Comment: 2-3 glasses  . Drug Use: No  . Sexual Activity: No   Other Topics Concern  . Not on file   Social History Narrative   Patient is married Jon Butler).   Patient is retired.   5 children    12th grade   2 cups caffeine daily     BP 90/58 mmHg  Pulse 81  Ht 5\' 9"  (1.753 m)  Wt 188 lb (85.276 kg)  BMI 27.75 kg/m2  Physical Exam:  Well appearing 79 year old man, NAD HEENT: Unremarkable Neck:  6 cm JVD, no thyromegally Back:  No CVA tenderness Lungs:  Clear  With no wheezes, rales, or rhonchi. HEART:  Regular rate rhythm, no murmurs, no rubs, no clicks Abd:   soft, positive bowel sounds, no organomegally, no rebound, no guarding Ext:  2 plus pulses, no edema, no cyanosis, no clubbing Skin:  No rashes no nodules Neuro:  CN II through XII intact, motor grossly intact   DEVICE  Normal device function.  See PaceArt for details.   Assess/Plan:

## 2014-12-06 DIAGNOSIS — I4891 Unspecified atrial fibrillation: Secondary | ICD-10-CM | POA: Diagnosis not present

## 2014-12-06 DIAGNOSIS — I5022 Chronic systolic (congestive) heart failure: Secondary | ICD-10-CM | POA: Diagnosis not present

## 2014-12-06 DIAGNOSIS — I472 Ventricular tachycardia: Secondary | ICD-10-CM | POA: Diagnosis not present

## 2014-12-06 DIAGNOSIS — R4189 Other symptoms and signs involving cognitive functions and awareness: Secondary | ICD-10-CM | POA: Diagnosis not present

## 2014-12-06 DIAGNOSIS — I447 Left bundle-branch block, unspecified: Secondary | ICD-10-CM | POA: Diagnosis not present

## 2014-12-06 DIAGNOSIS — Z5181 Encounter for therapeutic drug level monitoring: Secondary | ICD-10-CM | POA: Diagnosis not present

## 2014-12-07 DIAGNOSIS — I4891 Unspecified atrial fibrillation: Secondary | ICD-10-CM | POA: Diagnosis not present

## 2014-12-07 DIAGNOSIS — I472 Ventricular tachycardia: Secondary | ICD-10-CM | POA: Diagnosis not present

## 2014-12-07 DIAGNOSIS — Z5181 Encounter for therapeutic drug level monitoring: Secondary | ICD-10-CM | POA: Diagnosis not present

## 2014-12-07 DIAGNOSIS — R4189 Other symptoms and signs involving cognitive functions and awareness: Secondary | ICD-10-CM | POA: Diagnosis not present

## 2014-12-07 DIAGNOSIS — I447 Left bundle-branch block, unspecified: Secondary | ICD-10-CM | POA: Diagnosis not present

## 2014-12-07 DIAGNOSIS — I5022 Chronic systolic (congestive) heart failure: Secondary | ICD-10-CM | POA: Diagnosis not present

## 2014-12-13 DIAGNOSIS — I4891 Unspecified atrial fibrillation: Secondary | ICD-10-CM | POA: Diagnosis not present

## 2014-12-13 DIAGNOSIS — I472 Ventricular tachycardia: Secondary | ICD-10-CM | POA: Diagnosis not present

## 2014-12-13 DIAGNOSIS — Z5181 Encounter for therapeutic drug level monitoring: Secondary | ICD-10-CM | POA: Diagnosis not present

## 2014-12-13 DIAGNOSIS — R4189 Other symptoms and signs involving cognitive functions and awareness: Secondary | ICD-10-CM | POA: Diagnosis not present

## 2014-12-13 DIAGNOSIS — I447 Left bundle-branch block, unspecified: Secondary | ICD-10-CM | POA: Diagnosis not present

## 2014-12-13 DIAGNOSIS — I5022 Chronic systolic (congestive) heart failure: Secondary | ICD-10-CM | POA: Diagnosis not present

## 2014-12-15 DIAGNOSIS — I4891 Unspecified atrial fibrillation: Secondary | ICD-10-CM | POA: Diagnosis not present

## 2014-12-15 DIAGNOSIS — Z5181 Encounter for therapeutic drug level monitoring: Secondary | ICD-10-CM | POA: Diagnosis not present

## 2014-12-15 DIAGNOSIS — I472 Ventricular tachycardia: Secondary | ICD-10-CM | POA: Diagnosis not present

## 2014-12-15 DIAGNOSIS — R4189 Other symptoms and signs involving cognitive functions and awareness: Secondary | ICD-10-CM | POA: Diagnosis not present

## 2014-12-15 DIAGNOSIS — I5022 Chronic systolic (congestive) heart failure: Secondary | ICD-10-CM | POA: Diagnosis not present

## 2014-12-15 DIAGNOSIS — I447 Left bundle-branch block, unspecified: Secondary | ICD-10-CM | POA: Diagnosis not present

## 2014-12-20 DIAGNOSIS — R4189 Other symptoms and signs involving cognitive functions and awareness: Secondary | ICD-10-CM | POA: Diagnosis not present

## 2014-12-20 DIAGNOSIS — I5022 Chronic systolic (congestive) heart failure: Secondary | ICD-10-CM | POA: Diagnosis not present

## 2014-12-20 DIAGNOSIS — I4891 Unspecified atrial fibrillation: Secondary | ICD-10-CM | POA: Diagnosis not present

## 2014-12-20 DIAGNOSIS — I472 Ventricular tachycardia: Secondary | ICD-10-CM | POA: Diagnosis not present

## 2014-12-20 DIAGNOSIS — I447 Left bundle-branch block, unspecified: Secondary | ICD-10-CM | POA: Diagnosis not present

## 2014-12-20 DIAGNOSIS — Z5181 Encounter for therapeutic drug level monitoring: Secondary | ICD-10-CM | POA: Diagnosis not present

## 2014-12-21 ENCOUNTER — Encounter (HOSPITAL_COMMUNITY): Payer: Self-pay | Admitting: Internal Medicine

## 2014-12-21 ENCOUNTER — Ambulatory Visit (HOSPITAL_COMMUNITY)
Admission: RE | Admit: 2014-12-21 | Discharge: 2014-12-21 | Disposition: A | Discharge status: Home / Self Care | Payer: Medicare Other | Source: Ambulatory Visit | Attending: Internal Medicine | Admitting: Internal Medicine

## 2014-12-21 VITALS — BP 102/62 | HR 79 | Wt 181.5 lb

## 2014-12-21 DIAGNOSIS — Z79899 Other long term (current) drug therapy: Secondary | ICD-10-CM | POA: Insufficient documentation

## 2014-12-21 DIAGNOSIS — I428 Other cardiomyopathies: Secondary | ICD-10-CM | POA: Diagnosis not present

## 2014-12-21 DIAGNOSIS — Z95810 Presence of automatic (implantable) cardiac defibrillator: Secondary | ICD-10-CM | POA: Diagnosis not present

## 2014-12-21 DIAGNOSIS — R413 Other amnesia: Secondary | ICD-10-CM | POA: Diagnosis not present

## 2014-12-21 DIAGNOSIS — R627 Adult failure to thrive: Secondary | ICD-10-CM | POA: Diagnosis not present

## 2014-12-21 DIAGNOSIS — Z954 Presence of other heart-valve replacement: Secondary | ICD-10-CM

## 2014-12-21 DIAGNOSIS — Z7289 Other problems related to lifestyle: Secondary | ICD-10-CM

## 2014-12-21 DIAGNOSIS — N183 Chronic kidney disease, stage 3 (moderate): Secondary | ICD-10-CM | POA: Insufficient documentation

## 2014-12-21 DIAGNOSIS — Z9119 Patient's noncompliance with other medical treatment and regimen: Secondary | ICD-10-CM | POA: Insufficient documentation

## 2014-12-21 DIAGNOSIS — F0390 Unspecified dementia without behavioral disturbance: Secondary | ICD-10-CM | POA: Insufficient documentation

## 2014-12-21 DIAGNOSIS — Z789 Other specified health status: Secondary | ICD-10-CM | POA: Diagnosis not present

## 2014-12-21 DIAGNOSIS — I5023 Acute on chronic systolic (congestive) heart failure: Secondary | ICD-10-CM | POA: Diagnosis not present

## 2014-12-21 DIAGNOSIS — I482 Chronic atrial fibrillation, unspecified: Secondary | ICD-10-CM

## 2014-12-21 DIAGNOSIS — R4589 Other symptoms and signs involving emotional state: Secondary | ICD-10-CM | POA: Diagnosis not present

## 2014-12-21 DIAGNOSIS — I447 Left bundle-branch block, unspecified: Secondary | ICD-10-CM | POA: Insufficient documentation

## 2014-12-21 DIAGNOSIS — Z7982 Long term (current) use of aspirin: Secondary | ICD-10-CM | POA: Diagnosis not present

## 2014-12-21 DIAGNOSIS — R2689 Other abnormalities of gait and mobility: Secondary | ICD-10-CM | POA: Diagnosis not present

## 2014-12-21 DIAGNOSIS — I480 Paroxysmal atrial fibrillation: Secondary | ICD-10-CM | POA: Diagnosis not present

## 2014-12-21 DIAGNOSIS — Z952 Presence of prosthetic heart valve: Secondary | ICD-10-CM | POA: Insufficient documentation

## 2014-12-21 DIAGNOSIS — Z7901 Long term (current) use of anticoagulants: Secondary | ICD-10-CM | POA: Diagnosis not present

## 2014-12-21 DIAGNOSIS — I5022 Chronic systolic (congestive) heart failure: Secondary | ICD-10-CM | POA: Diagnosis not present

## 2014-12-21 LAB — BASIC METABOLIC PANEL
Anion gap: 9 (ref 5–15)
BUN: 20 mg/dL (ref 6–20)
CALCIUM: 8.7 mg/dL — AB (ref 8.9–10.3)
CO2: 27 mmol/L (ref 22–32)
CREATININE: 1.05 mg/dL (ref 0.61–1.24)
Chloride: 105 mmol/L (ref 101–111)
GFR calc non Af Amer: >60 mL/min (ref >60)
Glucose, Bld: 100 mg/dL — ABNORMAL HIGH (ref 65–99)
Potassium: 4.6 mmol/L (ref 3.5–5.1)
Sodium: 141 mmol/L (ref 135–145)

## 2014-12-21 LAB — PROTIME-INR
INR: 2.89 — ABNORMAL HIGH (ref 0.00–1.49)
PROTHROMBIN TIME: 29.8 s — AB (ref 11.6–15.2)

## 2014-12-21 LAB — BRAIN NATRIURETIC PEPTIDE: B Natriuretic Peptide: 1707.6 pg/mL — ABNORMAL HIGH (ref 0.0–100.0)

## 2014-12-21 MED ORDER — FUROSEMIDE 40 MG PO TABS: 40.0000 mg | ORAL_TABLET | Freq: Every day | ORAL | Status: DC

## 2014-12-21 NOTE — Patient Instructions (Signed)
INCREASE Lasix as follows: Take 40 mg (1 tab) 1 day, 80 mg (2 tabs) the next day, and alternate continuously.  Routine lab work today. Will notify you of abnormal results, otherwise no news is good news!  Return in 1-2 weeks for repeat lab work.  Follow up in 4 weeks.  Do the following things EVERYDAY: 1) Weigh yourself in the morning before breakfast. Write it down and keep it in a log. 2) Take your medicines as prescribed 3) Eat low salt foods-Limit salt (sodium) to 2000 mg per day.  4) Stay as active as you can everyday 5) Limit all fluids for the day to less than 2 liters

## 2014-12-21 NOTE — Progress Notes (Signed)
Patient ID: Jon Butler, male   DOB: 07-Dec-1935, 79 y.o.   MRN: [id number]   Advanced Heart Failure Note   Patient ID: Jon Butler, male   DOB: Dec 25, 1935, 79 y.o.   MRN: [id number] PCP: Dr Lysle Rubens EP: Dr Lovena Le [facility] HF: Delford Wingert  HPI: Jon Butler is a 79 yo male with a history of chronic systolic HF EF 123456 due to NICM s/p CRT-D (Medtronic), mechanical AVR (St Jude), LBBB, PAF S/P DC-CV 12/20/12, ETOH abuse and dementia and non-compliance.   Admitted to [facility] 10/10 through 10/26/12 with ADHF in setting of recurrent AF. Developed cardiogenic shock and VDRF   BB and ACE-I held and amiodarone gtt was started. Required short term Milrinone and lasix.Diuresed 26 pounds and transitioned to 40 mg daily. DC-CV on 10/20. Remains on Coumadin. D/C weight 206 pounds.   S/P successful DC-CV 12/20/12. Amio increased to 400 bid.   Has seen Dr. Sherron Flemings in Neurology for progressive cognitive decline in setting of ETOH abuse. Also had foot drop.   Admitted in 8/16 for respiratory failure due to PNA and HF in setting of recurrent AF. Underwent TEE/DC-CV with nearly immediate reversion to AF.Seen by EP and felt to have end-stage HF and rate control strategy recommended. Amio stopped. Also underwent botox injection in esophagus for esophageal spasm/dysphagia. Made DNR and ICD deactivated. Weight at home 193-194 (was discharged at 191 pound)   Returns for Heart Failure follow up. Weight down 6 lbs from visit in September.  Has felt better since being out of [facility] for a UTI, weight was up so had to take some extra lasix. Weight at home 181 this am. Wife feels like he has been getting slowly weaker. Jon Butler think this is because he can't do much.  Not walking around the house as much.  Daughter says he fell two weeks ago and hasn't really gotten out of bed since. Working with Jon Butler at home with no concern for fractures. Family says Jon Butler comes to house twice a week and gives Jon Butler assignments to do daily, but he has  not been. Denies lightheadedness or dizziness, BPs stable at home. Taking all medications, watching fluid and salt. Has not needed a metolazone.   11/01/12 Labs INR > 8 10/26/12 K 3.0 Creatinine 1.23  12/16/12 K 4.6 Creatinine 1.05 12/20/12 K 4.0 Creatinine 1.04 11/15      K 4.4 Creatinine 1.1 5/16: K 4.3 Creatinine 0.94  6/16: INR 1.9  09/07/14: K 4.0, Cr 1.00  ICD interrogation : Optivol going up and above threshold. Back in AF. No VT. Activity level poor < 1 hr per day.   ROS: All systems negative except as listed in HPI, PMH and Problem List.  Past Medical History  Diagnosis Date  . Left bundle-branch block   . Cardiomyopathy   . Chronic systolic heart failure ([place])   . Atrial fibrillation ([place])   . Dementia     Current Outpatient Prescriptions  Medication Sig Dispense Refill  . allopurinol (ZYLOPRIM) 300 MG tablet Take 300 mg by mouth daily.     Marland Kitchen amiodarone (PACERONE) 200 MG tablet Take 1 tablet (200 mg total) by mouth daily. 30 tablet 3  . amoxicillin (AMOXIL) 500 MG capsule Take 1 capsule (500 mg total) by mouth every other day. Restart after Antibiotic for UTI completed    . aspirin EC 81 MG tablet Take 1 tablet (81 mg total) by mouth daily.    . carvedilol (COREG) 3.125 MG tablet Take 3.125 mg by  mouth 2 (two) times daily with a meal.    . donepezil (ARICEPT) 23 MG TABS tablet Take 1 tablet (23 mg total) by mouth at bedtime. 30 tablet 3  . furosemide (LASIX) 40 MG tablet Take 1 tablet (40 mg total) by mouth daily. Take an extra 40mg  for weight gain. 60 tablet 3  . lisinopril (PRINIVIL,ZESTRIL) 10 MG tablet Take 0.5 tablets (5 mg total) by mouth at bedtime. 30 tablet 4  . LORazepam (ATIVAN) 0.5 MG tablet Take 1 tablet (0.5 mg total) by mouth every 8 (eight) hours as needed for anxiety. 20 tablet 0  . memantine (NAMENDA XR) 28 MG CP24 24 hr capsule Take 1 capsule (28 mg total) by mouth daily. 30 capsule 6  . potassium chloride SA (K-DUR,KLOR-CON) 20 MEQ tablet Take 1 tablet  (20 mEq total) by mouth daily. Take 1 extra tab when you take metolazone 30 tablet 3  . rosuvastatin (CRESTOR) 10 MG tablet Take 5 mg by mouth daily.     Marland Kitchen spironolactone (ALDACTONE) 25 MG tablet Take 0.5 tablets (12.5 mg total) by mouth daily. 30 tablet 3  . thiamine (VITAMIN B-1) 100 MG tablet Take 1 tablet (100 mg total) by mouth 2 (two) times daily. 60 tablet 6  . traZODone (DESYREL) 100 MG tablet Take 400 mg by mouth at bedtime.   2  . warfarin (COUMADIN) 2 MG tablet Take 1-1.5 tablets (2-3 mg total) by mouth daily at 6 PM. Take 2 mg (1 tablet) daily except on Sunday, Tuesday, and Friday. Take 3 mg (1.5 tablets) on Sunday, Tuesday, and Friday    . colchicine 0.6 MG tablet Take 0.6 mg by mouth daily as needed (gout pain). Reported on 12/21/2014    . fluticasone (FLONASE) 50 MCG/ACT nasal spray Place 2 sprays into both nostrils daily as needed for allergies or rhinitis. Reported on 12/21/2014    . metolazone (ZAROXOLYN) 2.5 MG tablet Take 1 tablet (2.5 mg total) by mouth daily. As needed for weight gain (Patient not taking: Reported on 12/21/2014)     No current facility-administered medications for this encounter.   Filed Vitals:   12/21/14 1416  BP: 102/62  Pulse: 79  Weight: 181 lb 8 oz (82.328 kg)  SpO2: 96%   Wt Readings from Last 3 Encounters:  12/21/14 181 lb 8 oz (82.328 kg)  12/05/14 188 lb (85.276 kg)  11/22/14 183 lb 6.8 oz (83.2 kg)    PHYSICAL EXAM: General:  Elderly and chronically ill appearing. NAD Wife and daughter present HEENT: normal Neck: supple. JVP 9-10 cm.  Carotids 2+ bilaterally; no bruits. No thyromegaly or nodule noted Cor: PMI normal. Irregularly irregular. Mechanical s2 No rubs, 2/6 TR. +S3 Lungs: Clear to auscultation. Abdomen: soft, NT, mildly distended, no HSM. No bruits or masses. +BS  Extremities: no cyanosis, clubbing, rash, Trace pre tibial  edema Neuro: alert & orientedx3, cranial nerves grossly intact. Moves all 4 extremities w/o difficulty.  Affect depressed.  ASSESSMENT & PLAN: 1. Chronic Systolic Heart Failure Echo 08/10/14 TEE with EF 15-20%  S/P Medtronic CRT-D 09/2012  - Volume status is elevated on Optivol and exam. As well as steady decline in Jon Butler activity. - Increase lasix to 40 mg/80mg  alternating days and continue KCL 20 meq.  - NYHA III-IIIB symptoms -Continue current meds.  - Has been on Coreg 3.125 BID chronically, despite chart showing 6.25 BID as far back as 2014. - Reinforced the need and importance of daily weights, a low sodium diet, and fluid restriction (less  than 2 L a day). Instructed to call the HF clinic if weight increases more than 3 lbs overnight or 5 lbs in a week.   2. A Fib-  S/P DC-CV 12/20/12.  - Failed DCCV in hospital, had been in NSR, but by optivol has been back in Afib for the past few weeks.  Continue amio at 200 daily.  - Continue coumadin.  3. CKD stage 3 - Check BMET today. Looking back this has actually been stable since August.  4. Alcohol- Still has an occasional beer, though sounds like maybe still as often as once a day. Encouraged alcohol abstinence especially with coumadin.  5. Memory loss-  Neurology following. On namenda and aricept. 6. Mechanical AVR - on coumadin. SBE prophylaxis for all procedures.  7. Prognosis - Jon Butler presents like failure to thrive. Has weight lost despite volume overload. Has been back in afib with increasing volume and decreasing activity over the past several weeks. Very poor sign for his long term prognosis.     BMET today and 7-10 days with increase in lasix.  Will see back in 3-4 weeks.   Shirley Friar, PA-C 2:44 PM   Patient seen and examined with Oda Kilts, PA-C. We discussed all aspects of the encounter. I agree with the assessment and plan as stated above.   He appears very weak and his family is extremely worried about his progressive decline but he insists he feels fine and doesn't move around because he doesn't have anything to do.  Volume status up on exam and by Optivol. Also back in AF. Agree with increasing lasix. Have encouraged him to try to move around a bit more. AF felt to be chronic and previously failed DC-CV. Remains on coumadin for AF and mechanical AVR. Check labs. Prognosis very concerning.   Naja Apperson,MD 8:28 PM

## 2014-12-22 ENCOUNTER — Encounter (HOSPITAL_COMMUNITY): Payer: Self-pay

## 2014-12-22 ENCOUNTER — Ambulatory Visit (INDEPENDENT_AMBULATORY_CARE_PROVIDER_SITE_OTHER): Payer: BLUE CROSS/BLUE SHIELD | Admitting: Internal Medicine

## 2014-12-22 DIAGNOSIS — I480 Paroxysmal atrial fibrillation: Secondary | ICD-10-CM

## 2014-12-22 DIAGNOSIS — Z5181 Encounter for therapeutic drug level monitoring: Secondary | ICD-10-CM

## 2014-12-22 DIAGNOSIS — Z95810 Presence of automatic (implantable) cardiac defibrillator: Secondary | ICD-10-CM

## 2014-12-22 NOTE — Progress Notes (Signed)
INR results faxed to coumadin clinic at [facility] street office.

## 2014-12-23 DIAGNOSIS — R4589 Other symptoms and signs involving emotional state: Secondary | ICD-10-CM | POA: Diagnosis not present

## 2014-12-23 DIAGNOSIS — F0390 Unspecified dementia without behavioral disturbance: Secondary | ICD-10-CM | POA: Diagnosis not present

## 2014-12-23 DIAGNOSIS — I480 Paroxysmal atrial fibrillation: Secondary | ICD-10-CM | POA: Diagnosis not present

## 2014-12-23 DIAGNOSIS — N183 Chronic kidney disease, stage 3 (moderate): Secondary | ICD-10-CM | POA: Diagnosis not present

## 2014-12-23 DIAGNOSIS — R2689 Other abnormalities of gait and mobility: Secondary | ICD-10-CM | POA: Diagnosis not present

## 2014-12-23 DIAGNOSIS — I5022 Chronic systolic (congestive) heart failure: Secondary | ICD-10-CM | POA: Diagnosis not present

## 2014-12-26 DIAGNOSIS — I5022 Chronic systolic (congestive) heart failure: Secondary | ICD-10-CM | POA: Diagnosis not present

## 2014-12-26 DIAGNOSIS — I480 Paroxysmal atrial fibrillation: Secondary | ICD-10-CM | POA: Diagnosis not present

## 2014-12-26 DIAGNOSIS — R4589 Other symptoms and signs involving emotional state: Secondary | ICD-10-CM | POA: Diagnosis not present

## 2014-12-26 DIAGNOSIS — R2689 Other abnormalities of gait and mobility: Secondary | ICD-10-CM | POA: Diagnosis not present

## 2014-12-26 DIAGNOSIS — F0390 Unspecified dementia without behavioral disturbance: Secondary | ICD-10-CM | POA: Diagnosis not present

## 2014-12-26 DIAGNOSIS — N183 Chronic kidney disease, stage 3 (moderate): Secondary | ICD-10-CM | POA: Diagnosis not present

## 2014-12-27 DIAGNOSIS — I5022 Chronic systolic (congestive) heart failure: Secondary | ICD-10-CM | POA: Diagnosis not present

## 2014-12-27 DIAGNOSIS — N183 Chronic kidney disease, stage 3 (moderate): Secondary | ICD-10-CM | POA: Diagnosis not present

## 2014-12-27 DIAGNOSIS — R2689 Other abnormalities of gait and mobility: Secondary | ICD-10-CM | POA: Diagnosis not present

## 2014-12-27 DIAGNOSIS — R4589 Other symptoms and signs involving emotional state: Secondary | ICD-10-CM | POA: Diagnosis not present

## 2014-12-27 DIAGNOSIS — F0390 Unspecified dementia without behavioral disturbance: Secondary | ICD-10-CM | POA: Diagnosis not present

## 2014-12-27 DIAGNOSIS — I480 Paroxysmal atrial fibrillation: Secondary | ICD-10-CM | POA: Diagnosis not present

## 2014-12-28 ENCOUNTER — Encounter: Payer: Self-pay | Admitting: Internal Medicine

## 2014-12-28 DIAGNOSIS — I480 Paroxysmal atrial fibrillation: Secondary | ICD-10-CM | POA: Diagnosis not present

## 2014-12-28 DIAGNOSIS — I5022 Chronic systolic (congestive) heart failure: Secondary | ICD-10-CM | POA: Diagnosis not present

## 2014-12-28 DIAGNOSIS — N183 Chronic kidney disease, stage 3 (moderate): Secondary | ICD-10-CM | POA: Diagnosis not present

## 2014-12-28 DIAGNOSIS — R2689 Other abnormalities of gait and mobility: Secondary | ICD-10-CM | POA: Diagnosis not present

## 2014-12-28 DIAGNOSIS — R4589 Other symptoms and signs involving emotional state: Secondary | ICD-10-CM | POA: Diagnosis not present

## 2014-12-28 DIAGNOSIS — F0390 Unspecified dementia without behavioral disturbance: Secondary | ICD-10-CM | POA: Diagnosis not present

## 2014-12-28 LAB — CUP PACEART INCLINIC DEVICE CHECK
Battery Voltage: 2.98 V
Brady Statistic AP VP Percent: 30.24 %
Brady Statistic AS VS Percent: 17.74 %
Date Time Interrogation Session: 20161129194233
HIGH POWER IMPEDANCE MEASURED VALUE: 43 Ohm
HIGH POWER IMPEDANCE MEASURED VALUE: 53 Ohm
Implantable Lead Implant Date: 20090326
Implantable Lead Implant Date: 20090326
Implantable Lead Location: 753860
Implantable Lead Model: 4194
Implantable Lead Model: 5076
Lead Channel Impedance Value: 361 Ohm
Lead Channel Impedance Value: 418 Ohm
Lead Channel Impedance Value: 665 Ohm
Lead Channel Pacing Threshold Amplitude: 0.875 V
Lead Channel Pacing Threshold Amplitude: 1 V
Lead Channel Pacing Threshold Pulse Width: 0.4 ms
Lead Channel Pacing Threshold Pulse Width: 0.4 ms
Lead Channel Sensing Intrinsic Amplitude: 0.5 mV
Lead Channel Setting Pacing Amplitude: 2 V
Lead Channel Setting Pacing Amplitude: 2 V
Lead Channel Setting Sensing Sensitivity: 0.6 mV
MDC IDC LEAD IMPLANT DT: 20090326
MDC IDC LEAD LOCATION: 753858
MDC IDC LEAD LOCATION: 753859
MDC IDC MSMT BATTERY REMAINING LONGEVITY: 67 mo
MDC IDC MSMT LEADCHNL LV IMPEDANCE VALUE: 475 Ohm
MDC IDC MSMT LEADCHNL LV PACING THRESHOLD PULSEWIDTH: 0.4 ms
MDC IDC MSMT LEADCHNL RA IMPEDANCE VALUE: 399 Ohm
MDC IDC MSMT LEADCHNL RV IMPEDANCE VALUE: 361 Ohm
MDC IDC MSMT LEADCHNL RV PACING THRESHOLD AMPLITUDE: 0.625 V
MDC IDC MSMT LEADCHNL RV SENSING INTR AMPL: 19.125 mV
MDC IDC SET LEADCHNL LV PACING PULSEWIDTH: 0.4 ms
MDC IDC SET LEADCHNL RV PACING AMPLITUDE: 2.5 V
MDC IDC SET LEADCHNL RV PACING PULSEWIDTH: 0.4 ms
MDC IDC STAT BRADY AP VS PERCENT: 1.59 %
MDC IDC STAT BRADY AS VP PERCENT: 50.43 %
MDC IDC STAT BRADY RA PERCENT PACED: 31.83 %
MDC IDC STAT BRADY RV PERCENT PACED: 75.92 %

## 2015-01-02 DIAGNOSIS — F0390 Unspecified dementia without behavioral disturbance: Secondary | ICD-10-CM | POA: Diagnosis not present

## 2015-01-02 DIAGNOSIS — N183 Chronic kidney disease, stage 3 (moderate): Secondary | ICD-10-CM | POA: Diagnosis not present

## 2015-01-02 DIAGNOSIS — R2689 Other abnormalities of gait and mobility: Secondary | ICD-10-CM | POA: Diagnosis not present

## 2015-01-02 DIAGNOSIS — I480 Paroxysmal atrial fibrillation: Secondary | ICD-10-CM | POA: Diagnosis not present

## 2015-01-02 DIAGNOSIS — R4589 Other symptoms and signs involving emotional state: Secondary | ICD-10-CM | POA: Diagnosis not present

## 2015-01-02 DIAGNOSIS — I5022 Chronic systolic (congestive) heart failure: Secondary | ICD-10-CM | POA: Diagnosis not present

## 2015-01-04 DIAGNOSIS — I5022 Chronic systolic (congestive) heart failure: Secondary | ICD-10-CM | POA: Diagnosis not present

## 2015-01-04 DIAGNOSIS — R2689 Other abnormalities of gait and mobility: Secondary | ICD-10-CM | POA: Diagnosis not present

## 2015-01-04 DIAGNOSIS — R4589 Other symptoms and signs involving emotional state: Secondary | ICD-10-CM | POA: Diagnosis not present

## 2015-01-04 DIAGNOSIS — I480 Paroxysmal atrial fibrillation: Secondary | ICD-10-CM | POA: Diagnosis not present

## 2015-01-04 DIAGNOSIS — N183 Chronic kidney disease, stage 3 (moderate): Secondary | ICD-10-CM | POA: Diagnosis not present

## 2015-01-04 DIAGNOSIS — F0390 Unspecified dementia without behavioral disturbance: Secondary | ICD-10-CM | POA: Diagnosis not present

## 2015-01-05 ENCOUNTER — Ambulatory Visit (HOSPITAL_COMMUNITY)
Admission: RE | Admit: 2015-01-05 | Discharge: 2015-01-05 | Disposition: A | Discharge status: Home / Self Care | Payer: Medicare Other | Source: Ambulatory Visit | Attending: Cardiology | Admitting: Cardiology

## 2015-01-05 DIAGNOSIS — I5022 Chronic systolic (congestive) heart failure: Secondary | ICD-10-CM | POA: Insufficient documentation

## 2015-01-05 LAB — BASIC METABOLIC PANEL
Anion gap: 10 (ref 5–15)
BUN: 22 mg/dL — AB (ref 6–20)
CALCIUM: 8.8 mg/dL — AB (ref 8.9–10.3)
CO2: 26 mmol/L (ref 22–32)
CREATININE: 0.9 mg/dL (ref 0.61–1.24)
Chloride: 102 mmol/L (ref 101–111)
GLUCOSE: 109 mg/dL — AB (ref 65–99)
POTASSIUM: 4.7 mmol/L (ref 3.5–5.1)
SODIUM: 138 mmol/L (ref 135–145)

## 2015-01-10 DIAGNOSIS — I480 Paroxysmal atrial fibrillation: Secondary | ICD-10-CM | POA: Diagnosis not present

## 2015-01-10 DIAGNOSIS — R2689 Other abnormalities of gait and mobility: Secondary | ICD-10-CM | POA: Diagnosis not present

## 2015-01-10 DIAGNOSIS — R4589 Other symptoms and signs involving emotional state: Secondary | ICD-10-CM | POA: Diagnosis not present

## 2015-01-10 DIAGNOSIS — N183 Chronic kidney disease, stage 3 (moderate): Secondary | ICD-10-CM | POA: Diagnosis not present

## 2015-01-10 DIAGNOSIS — I5022 Chronic systolic (congestive) heart failure: Secondary | ICD-10-CM | POA: Diagnosis not present

## 2015-01-10 DIAGNOSIS — F0390 Unspecified dementia without behavioral disturbance: Secondary | ICD-10-CM | POA: Diagnosis not present

## 2015-01-11 ENCOUNTER — Ambulatory Visit (INDEPENDENT_AMBULATORY_CARE_PROVIDER_SITE_OTHER): Payer: Medicare Other | Admitting: *Deleted

## 2015-01-11 DIAGNOSIS — I5022 Chronic systolic (congestive) heart failure: Secondary | ICD-10-CM | POA: Diagnosis not present

## 2015-01-11 DIAGNOSIS — I4891 Unspecified atrial fibrillation: Secondary | ICD-10-CM

## 2015-01-11 DIAGNOSIS — I482 Chronic atrial fibrillation, unspecified: Secondary | ICD-10-CM

## 2015-01-11 DIAGNOSIS — Z5181 Encounter for therapeutic drug level monitoring: Secondary | ICD-10-CM

## 2015-01-11 DIAGNOSIS — Z95810 Presence of automatic (implantable) cardiac defibrillator: Secondary | ICD-10-CM | POA: Diagnosis not present

## 2015-01-11 DIAGNOSIS — Z7901 Long term (current) use of anticoagulants: Secondary | ICD-10-CM

## 2015-01-11 DIAGNOSIS — N183 Chronic kidney disease, stage 3 (moderate): Secondary | ICD-10-CM | POA: Diagnosis not present

## 2015-01-11 DIAGNOSIS — F0390 Unspecified dementia without behavioral disturbance: Secondary | ICD-10-CM | POA: Diagnosis not present

## 2015-01-11 DIAGNOSIS — R4589 Other symptoms and signs involving emotional state: Secondary | ICD-10-CM | POA: Diagnosis not present

## 2015-01-11 DIAGNOSIS — R2689 Other abnormalities of gait and mobility: Secondary | ICD-10-CM | POA: Diagnosis not present

## 2015-01-11 DIAGNOSIS — I480 Paroxysmal atrial fibrillation: Secondary | ICD-10-CM | POA: Diagnosis not present

## 2015-01-11 LAB — POCT INR: INR: 2.8

## 2015-01-16 DIAGNOSIS — N183 Chronic kidney disease, stage 3 (moderate): Secondary | ICD-10-CM | POA: Diagnosis not present

## 2015-01-16 DIAGNOSIS — I5022 Chronic systolic (congestive) heart failure: Secondary | ICD-10-CM | POA: Diagnosis not present

## 2015-01-16 DIAGNOSIS — I480 Paroxysmal atrial fibrillation: Secondary | ICD-10-CM | POA: Diagnosis not present

## 2015-01-16 DIAGNOSIS — F0390 Unspecified dementia without behavioral disturbance: Secondary | ICD-10-CM | POA: Diagnosis not present

## 2015-01-16 DIAGNOSIS — R4589 Other symptoms and signs involving emotional state: Secondary | ICD-10-CM | POA: Diagnosis not present

## 2015-01-16 DIAGNOSIS — R2689 Other abnormalities of gait and mobility: Secondary | ICD-10-CM | POA: Diagnosis not present

## 2015-01-18 DIAGNOSIS — R4589 Other symptoms and signs involving emotional state: Secondary | ICD-10-CM | POA: Diagnosis not present

## 2015-01-18 DIAGNOSIS — F0390 Unspecified dementia without behavioral disturbance: Secondary | ICD-10-CM | POA: Diagnosis not present

## 2015-01-18 DIAGNOSIS — N183 Chronic kidney disease, stage 3 (moderate): Secondary | ICD-10-CM | POA: Diagnosis not present

## 2015-01-18 DIAGNOSIS — I5022 Chronic systolic (congestive) heart failure: Secondary | ICD-10-CM | POA: Diagnosis not present

## 2015-01-18 DIAGNOSIS — R2689 Other abnormalities of gait and mobility: Secondary | ICD-10-CM | POA: Diagnosis not present

## 2015-01-18 DIAGNOSIS — I480 Paroxysmal atrial fibrillation: Secondary | ICD-10-CM | POA: Diagnosis not present

## 2015-01-22 ENCOUNTER — Ambulatory Visit (HOSPITAL_COMMUNITY)
Admission: RE | Admit: 2015-01-22 | Discharge: 2015-01-22 | Disposition: A | Discharge status: Home / Self Care | Payer: Medicare Other | Source: Ambulatory Visit | Attending: Internal Medicine | Admitting: Internal Medicine

## 2015-01-22 ENCOUNTER — Encounter (HOSPITAL_COMMUNITY): Payer: Self-pay | Admitting: Internal Medicine

## 2015-01-22 VITALS — BP 104/62 | HR 89 | Wt 177.5 lb

## 2015-01-22 DIAGNOSIS — Z9119 Patient's noncompliance with other medical treatment and regimen: Secondary | ICD-10-CM | POA: Insufficient documentation

## 2015-01-22 DIAGNOSIS — I429 Cardiomyopathy, unspecified: Secondary | ICD-10-CM | POA: Diagnosis not present

## 2015-01-22 DIAGNOSIS — Z7982 Long term (current) use of aspirin: Secondary | ICD-10-CM | POA: Diagnosis not present

## 2015-01-22 DIAGNOSIS — I447 Left bundle-branch block, unspecified: Secondary | ICD-10-CM | POA: Insufficient documentation

## 2015-01-22 DIAGNOSIS — N183 Chronic kidney disease, stage 3 (moderate): Secondary | ICD-10-CM | POA: Insufficient documentation

## 2015-01-22 DIAGNOSIS — Z7901 Long term (current) use of anticoagulants: Secondary | ICD-10-CM | POA: Insufficient documentation

## 2015-01-22 DIAGNOSIS — F1010 Alcohol abuse, uncomplicated: Secondary | ICD-10-CM | POA: Insufficient documentation

## 2015-01-22 DIAGNOSIS — Z79899 Other long term (current) drug therapy: Secondary | ICD-10-CM | POA: Diagnosis not present

## 2015-01-22 DIAGNOSIS — I480 Paroxysmal atrial fibrillation: Secondary | ICD-10-CM

## 2015-01-22 DIAGNOSIS — R413 Other amnesia: Secondary | ICD-10-CM | POA: Diagnosis not present

## 2015-01-22 DIAGNOSIS — Z954 Presence of other heart-valve replacement: Secondary | ICD-10-CM

## 2015-01-22 DIAGNOSIS — I5022 Chronic systolic (congestive) heart failure: Secondary | ICD-10-CM

## 2015-01-22 DIAGNOSIS — F0390 Unspecified dementia without behavioral disturbance: Secondary | ICD-10-CM | POA: Insufficient documentation

## 2015-01-22 DIAGNOSIS — Z952 Presence of prosthetic heart valve: Secondary | ICD-10-CM

## 2015-01-22 NOTE — Progress Notes (Signed)
Patient ID: Jon Butler, male   DOB: May 05, 1935, 80 y.o.   MRN: [id number]   Advanced Heart Failure Note   Patient ID: REDGIE MASCH, male   DOB: [birth year], 80 y.o.   MRN: [id number] PCP: Dr Lysle Rubens EP: Dr Lovena Le [facility] HF: [place]  HPI: Mr. Tritto is a 80 yo male with a history of chronic systolic HF EF 123456 due to NICM s/p CRT-D (Medtronic), mechanical AVR (St Jude), LBBB, PAF S/P DC-CV 12/20/12, ETOH abuse and dementia and non-compliance.   Admitted to [facility] 10/10 through 10/26/12 with ADHF in setting of recurrent AF. Developed cardiogenic shock and VDRF   BB and ACE-I held and amiodarone gtt was started. Required short term Milrinone and lasix.Diuresed 26 pounds and transitioned to 40 mg daily. DC-CV on 10/20. Remains on Coumadin. D/C weight 206 pounds.   S/P successful DC-CV 12/20/12. Amio increased to 400 bid.   Has seen Dr. Sherron Flemings in Neurology for progressive cognitive decline in setting of ETOH abuse. Also had foot drop.   Admitted in 8/16 for respiratory failure due to PNA and HF in setting of recurrent AF. Underwent TEE/DC-CV with nearly immediate reversion to AF.Seen by EP and felt to have end-stage HF and rate control strategy recommended. Amio stopped. Also underwent botox injection in esophagus for esophageal spasm/dysphagia. Made DNR and ICD deactivated. Weight at home 193-194 (was discharged at 191 pound)   Saw Dr. Lovena Le in November 2016 and was in AFL paced back into NSR.  Returns for Heart Failure follow up.  At last visit volume status was ok.  Says he is feeling good. Weight holding at 176 at home. Taking lasix 80 one day and 40 the next. Trying to get around more. BP 120/60s. HR in low 60s. No swelling, orthopnea or PND.  Functional status limited, but walking more around the house with a walker.  Trying to be more active. Not taking extra lasix.   11/01/12 Labs INR > 8 10/26/12 K 3.0 Creatinine 1.23  12/16/12 K 4.6 Creatinine 1.05 12/20/12 K 4.0  Creatinine 1.04 11/15      K 4.4 Creatinine 1.1 5/16: K 4.3 Creatinine 0.94  6/16: INR 1.9  09/07/14: K 4.0, Cr 1.00 01/05/15 K 4.7 creatinine 0.9  ICD interrogation : Optivol stable and below threshold. Back in NSR. No VT. Activity level poor increased slightly ~ 1hour per day. 100% biv pacing   ROS: All systems negative except as listed in HPI, PMH and Problem List.  Past Medical History  Diagnosis Date  . Left bundle-branch block   . Cardiomyopathy   . Chronic systolic heart failure ([place])   . Atrial fibrillation ([place])   . Dementia     Current Outpatient Prescriptions  Medication Sig Dispense Refill  . allopurinol (ZYLOPRIM) 300 MG tablet Take 300 mg by mouth daily.     Marland Kitchen amiodarone (PACERONE) 200 MG tablet Take 1 tablet (200 mg total) by mouth daily. 30 tablet 3  . amoxicillin (AMOXIL) 500 MG capsule Take 1 capsule (500 mg total) by mouth every other day. Restart after Antibiotic for UTI completed    . aspirin EC 81 MG tablet Take 1 tablet (81 mg total) by mouth daily.    . carvedilol (COREG) 3.125 MG tablet Take 3.125 mg by mouth 2 (two) times daily with a meal.    . colchicine 0.6 MG tablet Take 0.6 mg by mouth daily as needed (gout pain). Reported on 12/21/2014    . donepezil (ARICEPT) 23 MG TABS  tablet Take 1 tablet (23 mg total) by mouth at bedtime. 30 tablet 3  . fluticasone (FLONASE) 50 MCG/ACT nasal spray Place 2 sprays into both nostrils daily as needed for allergies or rhinitis. Reported on 12/21/2014    . furosemide (LASIX) 40 MG tablet Take 1 tablet (40 mg total) by mouth daily. Take 40 mg (1 tab) 1 day, 80 mg (2 tabs) the next day, and alternate continuously. 90 tablet 6  . lisinopril (PRINIVIL,ZESTRIL) 10 MG tablet Take 0.5 tablets (5 mg total) by mouth at bedtime. 30 tablet 4  . LORazepam (ATIVAN) 0.5 MG tablet Take 1 tablet (0.5 mg total) by mouth every 8 (eight) hours as needed for anxiety. 20 tablet 0  . memantine (NAMENDA XR) 28 MG CP24 24 hr capsule Take 1  capsule (28 mg total) by mouth daily. 30 capsule 6  . potassium chloride SA (K-DUR,KLOR-CON) 20 MEQ tablet Take 1 tablet (20 mEq total) by mouth daily. Take 1 extra tab when you take metolazone 30 tablet 3  . rosuvastatin (CRESTOR) 10 MG tablet Take 5 mg by mouth daily.     Marland Kitchen spironolactone (ALDACTONE) 25 MG tablet Take 0.5 tablets (12.5 mg total) by mouth daily. 30 tablet 3  . thiamine (VITAMIN B-1) 100 MG tablet Take 1 tablet (100 mg total) by mouth 2 (two) times daily. 60 tablet 6  . traZODone (DESYREL) 100 MG tablet Take 400 mg by mouth at bedtime.   2  . warfarin (COUMADIN) 2 MG tablet Take 1-1.5 tablets (2-3 mg total) by mouth daily at 6 PM. Take 2 mg (1 tablet) daily except on Sunday, Tuesday, and Friday. Take 3 mg (1.5 tablets) on Sunday, Tuesday, and Friday    . metolazone (ZAROXOLYN) 2.5 MG tablet Take 1 tablet (2.5 mg total) by mouth daily. As needed for weight gain (Patient not taking: Reported on 12/21/2014)     No current facility-administered medications for this encounter.   Filed Vitals:   01/22/15 1438  BP: 104/62  Pulse: 89  Weight: 177 lb 8 oz (80.513 kg)  SpO2: 96%     PHYSICAL EXAM: General:  Elderly. No resp difficulty Wife present HEENT: normal Neck: supple. JVP 5-6 cm.  Carotids 2+ bilaterally; no bruits. No lymphadenopathy or thryomegaly appreciated. Cor: PMI normal. RRR. Mechanical s2 No rubs, gallops 2/6 TR. Lungs: clear  Abdomen: soft, nontender,no distention. No HSM. No bruits or masses. Good bowel sounds. Extremities: no cyanosis, clubbing, rash, no edema Neuro: alert & orientedx3, cranial nerves grossly intact. Moves all 4 extremities w/o difficulty. Affect normal  ASSESSMENT & PLAN: 1. Chronic Systolic Heart Failure TEE with EF 15-20%  S/P Medtronic CRT-D 09/2012  - Volume status is stable on Optivol and exam.  - Continue lasix 40 daily alternating with 80 daily. New goal weight ~176 pounds - Improved NYHA III symptoms - Continue current meds.  2. A  Fib-  S/P DC-CV 12/20/12 but reverted to AF. Had AFL in 11/1 and paced out.  - Remains in NSR today on exam and by ICD interrogation.  Continue amio at 200 daily.  - Continue coumadin for AF and AVR.  3. CKD stage 3- stable on recent labs  4. Alcohol- Reinforced alcohol abstinence especially with coumadin.  5. Memory loss-  Neurology following. On namenda and aricept. Seems better today.  6. Mechanical AVR - on coumadin. SBE prophylaxis for all procedures.    Glori Bickers, MD  2:53 PM

## 2015-01-22 NOTE — Patient Instructions (Signed)
Doing great!  Please follow-up in 2 months with the AHF Clinic.

## 2015-01-24 DIAGNOSIS — R2689 Other abnormalities of gait and mobility: Secondary | ICD-10-CM | POA: Diagnosis not present

## 2015-01-24 DIAGNOSIS — N183 Chronic kidney disease, stage 3 (moderate): Secondary | ICD-10-CM | POA: Diagnosis not present

## 2015-01-24 DIAGNOSIS — R4589 Other symptoms and signs involving emotional state: Secondary | ICD-10-CM | POA: Diagnosis not present

## 2015-01-24 DIAGNOSIS — F0390 Unspecified dementia without behavioral disturbance: Secondary | ICD-10-CM | POA: Diagnosis not present

## 2015-01-24 DIAGNOSIS — I5022 Chronic systolic (congestive) heart failure: Secondary | ICD-10-CM | POA: Diagnosis not present

## 2015-01-24 DIAGNOSIS — I480 Paroxysmal atrial fibrillation: Secondary | ICD-10-CM | POA: Diagnosis not present

## 2015-01-26 DIAGNOSIS — N183 Chronic kidney disease, stage 3 (moderate): Secondary | ICD-10-CM | POA: Diagnosis not present

## 2015-01-26 DIAGNOSIS — R4589 Other symptoms and signs involving emotional state: Secondary | ICD-10-CM | POA: Diagnosis not present

## 2015-01-26 DIAGNOSIS — R2689 Other abnormalities of gait and mobility: Secondary | ICD-10-CM | POA: Diagnosis not present

## 2015-01-26 DIAGNOSIS — I5022 Chronic systolic (congestive) heart failure: Secondary | ICD-10-CM | POA: Diagnosis not present

## 2015-01-26 DIAGNOSIS — I480 Paroxysmal atrial fibrillation: Secondary | ICD-10-CM | POA: Diagnosis not present

## 2015-01-26 DIAGNOSIS — F0390 Unspecified dementia without behavioral disturbance: Secondary | ICD-10-CM | POA: Diagnosis not present

## 2015-01-30 DIAGNOSIS — R2689 Other abnormalities of gait and mobility: Secondary | ICD-10-CM | POA: Diagnosis not present

## 2015-01-30 DIAGNOSIS — R4589 Other symptoms and signs involving emotional state: Secondary | ICD-10-CM | POA: Diagnosis not present

## 2015-01-30 DIAGNOSIS — F0390 Unspecified dementia without behavioral disturbance: Secondary | ICD-10-CM | POA: Diagnosis not present

## 2015-01-30 DIAGNOSIS — N183 Chronic kidney disease, stage 3 (moderate): Secondary | ICD-10-CM | POA: Diagnosis not present

## 2015-01-30 DIAGNOSIS — I5022 Chronic systolic (congestive) heart failure: Secondary | ICD-10-CM | POA: Diagnosis not present

## 2015-01-30 DIAGNOSIS — I480 Paroxysmal atrial fibrillation: Secondary | ICD-10-CM | POA: Diagnosis not present

## 2015-02-02 DIAGNOSIS — F0390 Unspecified dementia without behavioral disturbance: Secondary | ICD-10-CM | POA: Diagnosis not present

## 2015-02-02 DIAGNOSIS — R2689 Other abnormalities of gait and mobility: Secondary | ICD-10-CM | POA: Diagnosis not present

## 2015-02-02 DIAGNOSIS — I5022 Chronic systolic (congestive) heart failure: Secondary | ICD-10-CM | POA: Diagnosis not present

## 2015-02-02 DIAGNOSIS — R4589 Other symptoms and signs involving emotional state: Secondary | ICD-10-CM | POA: Diagnosis not present

## 2015-02-02 DIAGNOSIS — I480 Paroxysmal atrial fibrillation: Secondary | ICD-10-CM | POA: Diagnosis not present

## 2015-02-02 DIAGNOSIS — N183 Chronic kidney disease, stage 3 (moderate): Secondary | ICD-10-CM | POA: Diagnosis not present

## 2015-02-20 ENCOUNTER — Other Ambulatory Visit (HOSPITAL_COMMUNITY): Payer: Self-pay | Admitting: *Deleted

## 2015-02-20 ENCOUNTER — Ambulatory Visit (INDEPENDENT_AMBULATORY_CARE_PROVIDER_SITE_OTHER): Payer: Medicare Other | Admitting: *Deleted

## 2015-02-20 DIAGNOSIS — Z95810 Presence of automatic (implantable) cardiac defibrillator: Secondary | ICD-10-CM | POA: Diagnosis not present

## 2015-02-20 DIAGNOSIS — Z7901 Long term (current) use of anticoagulants: Secondary | ICD-10-CM | POA: Diagnosis not present

## 2015-02-20 DIAGNOSIS — I480 Paroxysmal atrial fibrillation: Secondary | ICD-10-CM

## 2015-02-20 DIAGNOSIS — Z5181 Encounter for therapeutic drug level monitoring: Secondary | ICD-10-CM

## 2015-02-20 DIAGNOSIS — I4891 Unspecified atrial fibrillation: Secondary | ICD-10-CM | POA: Diagnosis not present

## 2015-02-20 LAB — POCT INR: INR: 2.9

## 2015-02-20 MED ORDER — METOLAZONE 2.5 MG PO TABS: 2.5000 mg | ORAL_TABLET | Freq: Every day | ORAL | Status: DC

## 2015-02-21 ENCOUNTER — Other Ambulatory Visit (HOSPITAL_COMMUNITY): Payer: Self-pay | Admitting: *Deleted

## 2015-02-27 ENCOUNTER — Ambulatory Visit (INDEPENDENT_AMBULATORY_CARE_PROVIDER_SITE_OTHER): Payer: Medicare Other | Admitting: Diagnostic Neuroimaging

## 2015-02-27 ENCOUNTER — Encounter: Payer: Self-pay | Admitting: Diagnostic Neuroimaging

## 2015-02-27 VITALS — BP 107/68 | HR 71 | Ht 69.5 in | Wt 175.6 lb

## 2015-02-27 DIAGNOSIS — F0390 Unspecified dementia without behavioral disturbance: Secondary | ICD-10-CM

## 2015-02-27 DIAGNOSIS — F03B0 Unspecified dementia, moderate, without behavioral disturbance, psychotic disturbance, mood disturbance, and anxiety: Secondary | ICD-10-CM

## 2015-02-27 NOTE — Progress Notes (Signed)
[facility]  PATIENT: Jon Butler DOB: Jun 11, 1935  REFERRING CLINICIAN:  HISTORY FROM: patient and wife  REASON FOR VISIT: follow up (transfer from Dr. Janann Colonel)   HISTORICAL  CHIEF COMPLAINT:  Chief Complaint  Patient presents with  . Mild cognitive impairment    rm 7, wife-Brenda, MMSE 17  . Follow-up    yearly    HISTORY OF PRESENT ILLNESS:   UPDATE 02/27/15 (VRP): Since last visit, continued decline. Also had 2 hospital stays in Aug and then Nov 2016 for shortness of breath, CHF exacerbation, PNA, infection. Wife and daughter are caring for him at home. He no longer drives, shops, pays bills. ADLs significantly reduced.   UPDATE 03/21/14 (VRP): Patient presents for follow-up and transfer of care. In summary patient denies any memory problems. He is not sure why he is here. He does not remember seeing Dr. Janann Colonel. He thinks his memory is normal. According to the wife, patient has had gradual, progressive short-term memory problems, forgetting recent conversations, asking questions over and over, mixing up his medications, since 2013. Patient's wife, patient's children, patient's other doctors have noticed these memory problems. Patient has stopped driving. As ago due to heart problems. He continues to take care of his own personal hygiene needs, bathing, dressing, eating. His wife has taken over finances and his medications. Patient and his wife had to sell some real estate property due to his inability to keep up with it. I reviewed alcohol use with patient and his wife. Patient states that he drinks maybe 1 or 2 beers per day, and he does not take its problem. Patient's wife shakes her head in the background, and holds up 5 fingers, indicating that he drinks up to 5 beers per day. Patient also has had 3 year progressive tripping and weakness in his ankles and feet, with bilateral foot drop. No specific etiology has been found. He was recommended to have ankle fusion  surgery, but this was not recommended due to his heart conditions.  UPDATE 09/15/13 (PS): Last visit was 03/2013 at which time his Aricept was increased to 10mg  nightly and he was continued on Namenda 28mg  ER. Since last visit patient feels he is stable overall, wife feels slight worsening in short term memory. He has had 2 to 3 falls since last visit. It appears to be a mechanical fall as wife notes he catches his left toes when walking. Wife notes he has difficulty raising that foot, reports having a chronic ankle injury for which he is followed by ortho surgery.   PRIOR HPI (03/23/13, Dr. Janann Colonel): 80 y.o. male here as a referral from Dr. Lysle Rubens for memory decline. Currently taking Aricept 5mg  nightly and Namenda 28mg  ER. Patient denies any concerns. Wife believes problem started around 1.5 years ago. Mainly a problem with short term memory, forgetting day to day things. They feel it is getting progressively worse with a noted drastic decline after event in October. Trouble recalling why he went into a room, forgets talking to people. Wife notes good remote memory. Takes trazodone to help with his sleep, does not sleep well without it. No hallucinations. Started Namenda in August 2014, Aricept started this month. States he drinks "a few glasses of wine" a day. He is not currently driving, stopped in October due to LOC from heart failure, he was intubated for respiratory failure at this time and spent 15 days in the hospital. Has artificial aortic valve replacement and ICD in place, currently taking coumadin.  REVIEW OF SYSTEMS: Full 14 system review of systems performed and notable only fAppetite change choking trouble swallowing cold intolerance walking difficulty agitation confusion anxiety memory loss easy bruising and bleeding.  ALLERGIES: No Known Allergies  HOME MEDICATIONS: Outpatient Prescriptions Prior to Visit  Medication Sig Dispense Refill  . allopurinol (ZYLOPRIM) 300 MG tablet Take  300 mg by mouth daily.     Marland Kitchen amiodarone (PACERONE) 200 MG tablet Take 1 tablet (200 mg total) by mouth daily. 30 tablet 3  . amoxicillin (AMOXIL) 500 MG capsule Take 1 capsule (500 mg total) by mouth every other day. Restart after Antibiotic for UTI completed    . aspirin EC 81 MG tablet Take 1 tablet (81 mg total) by mouth daily.    . carvedilol (COREG) 3.125 MG tablet Take 3.125 mg by mouth 2 (two) times daily with a meal.    . colchicine 0.6 MG tablet Take 0.6 mg by mouth daily as needed (gout pain). Reported on 12/21/2014    . donepezil (ARICEPT) 23 MG TABS tablet Take 1 tablet (23 mg total) by mouth at bedtime. 30 tablet 3  . fluticasone (FLONASE) 50 MCG/ACT nasal spray Place 2 sprays into both nostrils daily as needed for allergies or rhinitis. Reported on 12/21/2014    . furosemide (LASIX) 40 MG tablet Take 1 tablet (40 mg total) by mouth daily. Take 40 mg (1 tab) 1 day, 80 mg (2 tabs) the next day, and alternate continuously. 90 tablet 6  . lisinopril (PRINIVIL,ZESTRIL) 10 MG tablet Take 0.5 tablets (5 mg total) by mouth at bedtime. 30 tablet 4  . LORazepam (ATIVAN) 0.5 MG tablet Take 1 tablet (0.5 mg total) by mouth every 8 (eight) hours as needed for anxiety. 20 tablet 0  . memantine (NAMENDA XR) 28 MG CP24 24 hr capsule Take 1 capsule (28 mg total) by mouth daily. 30 capsule 6  . metolazone (ZAROXOLYN) 2.5 MG tablet Take 1 tablet (2.5 mg total) by mouth daily. As needed for weight gain 30 tablet 3  . potassium chloride SA (K-DUR,KLOR-CON) 20 MEQ tablet Take 1 tablet (20 mEq total) by mouth daily. Take 1 extra tab when you take metolazone 30 tablet 3  . rosuvastatin (CRESTOR) 10 MG tablet Take 5 mg by mouth daily.     Marland Kitchen spironolactone (ALDACTONE) 25 MG tablet Take 0.5 tablets (12.5 mg total) by mouth daily. 30 tablet 3  . thiamine (VITAMIN B-1) 100 MG tablet Take 1 tablet (100 mg total) by mouth 2 (two) times daily. 60 tablet 6  . traZODone (DESYREL) 100 MG tablet Take 400 mg by mouth at  bedtime.   2  . warfarin (COUMADIN) 2 MG tablet Take 1-1.5 tablets (2-3 mg total) by mouth daily at 6 PM. Take 2 mg (1 tablet) daily except on Sunday, Tuesday, and Friday. Take 3 mg (1.5 tablets) on Sunday, Tuesday, and Friday     No facility-administered medications prior to visit.    PAST MEDICAL HISTORY: Past Medical History  Diagnosis Date  . Left bundle-branch block   . Cardiomyopathy   . Chronic systolic heart failure ([place])   . Atrial fibrillation ([place])   . Dementia   . Pneumonia 08/2014    PAST SURGICAL HISTORY: Past Surgical History  Procedure Laterality Date  . Doppler echocardiography  2008, 2009  . Aortic valve replacement    . Tee without cardioversion N/A 10/15/2012    Procedure: TRANSESOPHAGEAL ECHOCARDIOGRAM (TEE);  Surgeon: Fay Records, MD;  Location: [facility];  Service:  Cardiovascular;  Laterality: N/A;  . Cardioversion N/A 10/25/2012    Procedure: CARDIOVERSION;  Surgeon: Evans Lance, MD;  Location: [place];  Service: Cardiovascular;  Laterality: N/A;  . Cardioversion N/A 12/20/2012    Procedure: CARDIOVERSION;  Surgeon: Larey Dresser, MD;  Location: [place];  Service: Cardiovascular;  Laterality: N/A;  . Implantable cardioverter defibrillator generator change N/A 09/20/2012    Procedure: IMPLANTABLE CARDIOVERTER DEFIBRILLATOR GENERATOR CHANGE;  Surgeon: Evans Lance, MD;  Location: [facility] CATH LAB;  Service: Cardiovascular;  Laterality: N/A;  . Tee without cardioversion N/A 08/10/2014    Procedure: TRANSESOPHAGEAL ECHOCARDIOGRAM (TEE);  Surgeon: Sanda Klein, MD;  Location: [place];  Service: Cardiovascular;  Laterality: N/A;  . Cardioversion N/A 08/10/2014    Procedure: CARDIOVERSION;  Surgeon: Sanda Klein, MD;  Location: [facility] ENDOSCOPY;  Service: Cardiovascular;  Laterality: N/A;  . Esophagogastroduodenoscopy (egd) with propofol N/A 08/18/2014    Procedure: ESOPHAGOGASTRODUODENOSCOPY (EGD) WITH PROPOFOL;  Surgeon: Wonda Horner, MD;  Location: [facility]  ENDOSCOPY;  Service: Endoscopy;  Laterality: N/A;  . Botox injection N/A 08/18/2014    Procedure: BOTOX INJECTION;  Surgeon: Wonda Horner, MD;  Location: [facility] ENDOSCOPY;  Service: Endoscopy;  Laterality: N/A;    FAMILY HISTORY: Family History  Problem Relation Age of Onset  . Aneurysm Mother     brain  . Heart attack Father 25    SOCIAL HISTORY:  Social History   Social History  . Marital Status: Married    Spouse Name: [name]  . Number of Children: 5  . Years of Education: 12   Occupational History  . retired    Social History Main Topics  . Smoking status: Never Smoker   . Smokeless tobacco: Never Used  . Alcohol Use: 0.0 oz/week    2-3 Glasses of wine per week     Comment: 2-3 glasses  . Drug Use: No  . Sexual Activity: No   Other Topics Concern  . Not on file   Social History Narrative   Patient is married [name]).   Patient is retired.   5 children    12th grade   2 cups caffeine daily     PHYSICAL EXAM  Filed Vitals:   02/27/15 1453  BP: 107/68  Pulse: 71  Height: 5' 9.5" (1.765 m)  Weight: 175 lb 9.6 oz (79.652 kg)    Body mass index is 25.57 kg/(m^2).  No exam data present  MMSE - Mini Mental State Exam 02/27/2015 03/21/2014  Orientation to time 1 4  Orientation to Place 4 5  Registration 3 3  Attention/ Calculation 2 5  Recall 0 3  Language- name 2 objects 2 2  Language- repeat 0 1  Language- follow 3 step command 3 1  Language- read & follow direction 1 1  Write a sentence 1 1  Copy design 0 1  Total score 17 27    GENERAL EXAM: Patient is in no distress; well developed, nourished and groomed; neck is supple  CARDIOVASCULAR: Regular rate and rhythm; SYSTOLIC MECHANICAL CLICK; no carotid bruits  NEUROLOGIC: MENTAL STATUS: awake, alert, language fluent, comprehension intact, naming intact, fund of knowledge appropriate; POOR INSIGHT; DENIES DEFICITS CRANIAL NERVE: pupils equal and reactive to light, visual fields full to  confrontation, extraocular muscles intact, no nystagmus, facial sensation and strength symmetric, hearing intact, palate elevates symmetrically, uvula midline, shoulder shrug symmetric, tongue midline. MOTOR: normal bulk and tone, full strength in the BUE, BLE; EXCEPT BILATERAL FOOT DROP (3/5 DF) SENSORY: ABSENT VIB  AT TOES AND ANKLES COORDINATION: finger-nose-finger, fine finger movements normal REFLEXES: deep tendon reflexes present and symmetric; TRACE AT KNESS; ABSENT AT ANKLES GAIT/STATION: WIDE based gait; MODERATE STEPPAGE GAIT; UNSTEADY GAIT    DIAGNOSTIC DATA (LABS, IMAGING, TESTING) - I reviewed patient records, labs, notes, testing and imaging myself where available.  Lab Results  Component Value Date   WBC 9.6 11/24/2014   HGB 11.6* 11/24/2014   HCT 35.9* 11/24/2014   MCV 97.6 11/24/2014   PLT 95* 11/24/2014      Component Value Date/Time   NA 138 01/05/2015 1204   K 4.7 01/05/2015 1204   CL 102 01/05/2015 1204   CO2 26 01/05/2015 1204   GLUCOSE 109* 01/05/2015 1204   BUN 22* 01/05/2015 1204   CREATININE 0.90 01/05/2015 1204   CALCIUM 8.8* 01/05/2015 1204   PROT 5.1* 08/09/2014 0745   ALBUMIN 2.9* 08/09/2014 0745   AST 28 08/09/2014 0745   ALT 28 08/09/2014 0745   ALKPHOS 82 08/09/2014 0745   BILITOT 1.5* 08/09/2014 0745   GFRNONAA >60 01/05/2015 1204   GFRAA >60 01/05/2015 1204   Lab Results  Component Value Date   CHOL  03/29/2007    183        ATP III CLASSIFICATION:  <200     mg/dL   Desirable  200-239  mg/dL   Borderline High  >=240    mg/dL   High   HDL 50 03/29/2007   LDLCALC * 03/29/2007    106        Total Cholesterol/HDL:CHD Risk Coronary Heart Disease Risk Table                     Men   Women  1/2 Average Risk   3.4   3.3   TRIG 133 03/29/2007   CHOLHDL 3.7 03/29/2007   Lab Results  Component Value Date   HGBA1C  03/29/2007    5.4 (NOTE)   The ADA recommends the following therapeutic goals for glycemic   control related to Hgb A1C  measurement:   Goal of Therapy:   < 7.0% Hgb A1C   Action Suggested:  > 8.0% Hgb A1C   Ref:  Diabetes Care, 22, Suppl. 1, 1999   Lab Results  Component Value Date   VITAMINB12 >1999* 03/30/2013   Lab Results  Component Value Date   TSH 2.07 11/29/2013    I reviewed images myself and agree with interpretation. -VRP  03/28/13 CT head (without) demonstrating: 1. Mild ventriculomegaly may reflect mild subcortical atrophy.  2. No acute findings. 3. No change from 06/18/12.  10/15/12 TEE  - Left atrium: LA appendage with echodensities near tip that are consistent with thrombus. - Right atrium: Defibrillator wires present in RA There are mobile echodensities attached to wires thatare consistentwith vegetations. Cannot completely excludethombus though unusual location.  06/18/12 CAROTID U/S 1. Bilateral atherosclerotic plaque, not resulting in hemodynamically significant stenosis. 2. Incidental note made of an approximately 1.3 cm mixed echogenic solid nodule within the right lobe of the thyroid. Further evaluation with dedicated thyroid ultrasound may be performed as clinically indicated.     ASSESSMENT AND PLAN  80 y.o. year old male here with progressive cognitive decline and memory loss, with significant denial of deficits. Also with marital tension, excess alcohol use. Also with bilateral foot drop since 2013 (neuropathy vs lumbar radiculopathy; apparently had workup and EMG/NCS testing via orthopedic clinic).  03/23/13 MOCA 19/30 03/22/14 MMSE 27/30 02/27/15 MMSE 17/30   Dx  memory loss: moderate dementia (alzheimers vs vascular vs alcoholic dementia)  Dx foot drop: neuropathy (alcoholic vs idiopathic vs lumbar radiculopathies)   PLAN: I spent 40 minutes of face to face time with patient. Greater than 50% of time was spent in counseling and coordination of care with patient. In summary we discussed:  - continue aricept and namenda - we discussed dementia diagnosis,  prognosis, treatment options, home safety, advanced care planning, end of life planning, caregiver stress - consider home palliative care consult  Return if symptoms worsen or fail to improve, for return to PCP.    Penni Bombard, MD [id number], [id number] PM Certified in Neurology, Neurophysiology and Neuroimaging  [facility] [address] [place], [place] [address] [phone]

## 2015-02-27 NOTE — Patient Instructions (Signed)
Thank you for coming to see [place] at [facility]. I hope we have been able to provide you high quality care today.  You may receive a patient satisfaction survey over the next few weeks. We would appreciate your feedback and comments so that we may continue to improve ourselves and the health of our patients.  - continue aricept and namenda - we discussed dementia diagnosis, prognosis, treatment options, home safety, advanced care planning, end of life planning, caregiver stress   ~~~~~~~~~~~~~~~~~~~~~~~~~~~~~~~~~~~~~~~~~~~~~~~~~~~~~~~~~~~~~~~~~  DR. Braydan Marriott'S GUIDE TO HAPPY AND HEALTHY LIVING These are some of my general health and wellness recommendations. Some of them may apply to you better than others. Please use common sense as you try these suggestions and feel free to ask me any questions.   ACTIVITY/FITNESS Mental, social, emotional and physical stimulation are very important for brain and body health. Try learning a new activity (arts, music, language, sports, games).  Keep moving your body to the best of your abilities. You can do this at home, inside or outside, the park, community center, gym or anywhere you like. Consider a physical therapist or personal trainer to get started. Consider the app Sworkit. Fitness trackers such as smart-watches, smart-phones or Fitbits can help as well.   NUTRITION Eat more plants: colorful vegetables, nuts, seeds and berries.  Eat less sugar, salt, preservatives and processed foods.  Avoid toxins such as cigarettes and alcohol.  Drink water when you are thirsty. Warm water with a slice of lemon is an excellent morning drink to start the day.  Consider these websites for more information The Nutrition Source (https://www.henry-hernandez.biz/) Precision Nutrition (WindowBlog.ch)   RELAXATION Consider practicing mindfulness meditation or other relaxation techniques such as deep  breathing, prayer, yoga, tai chi, massage. See website mindful.org or the apps Headspace or Calm to help get started.   SLEEP Try to get at least 7-8+ hours sleep per day. Regular exercise and reduced caffeine will help you sleep better. Practice good sleep hygeine techniques. See website sleep.org for more information.   PLANNING Prepare estate planning, living will, healthcare POA documents. Sometimes this is best planned with the help of an attorney. Theconversationproject.org and agingwithdignity.org are excellent resources.

## 2015-03-06 ENCOUNTER — Ambulatory Visit (INDEPENDENT_AMBULATORY_CARE_PROVIDER_SITE_OTHER): Payer: Medicare Other | Admitting: *Deleted

## 2015-03-06 DIAGNOSIS — Z95810 Presence of automatic (implantable) cardiac defibrillator: Secondary | ICD-10-CM | POA: Diagnosis not present

## 2015-03-06 DIAGNOSIS — I429 Cardiomyopathy, unspecified: Secondary | ICD-10-CM

## 2015-03-06 DIAGNOSIS — I5022 Chronic systolic (congestive) heart failure: Secondary | ICD-10-CM

## 2015-03-06 NOTE — Progress Notes (Signed)
Remote ICD transmission.   

## 2015-03-22 ENCOUNTER — Encounter (HOSPITAL_COMMUNITY): Payer: Self-pay | Admitting: Internal Medicine

## 2015-03-22 ENCOUNTER — Ambulatory Visit (HOSPITAL_COMMUNITY)
Admission: RE | Admit: 2015-03-22 | Discharge: 2015-03-22 | Disposition: A | Discharge status: Home / Self Care | Payer: Medicare Other | Source: Ambulatory Visit | Attending: Internal Medicine | Admitting: Internal Medicine

## 2015-03-22 VITALS — BP 111/67 | HR 70 | Resp 18 | Wt 175.5 lb

## 2015-03-22 DIAGNOSIS — I480 Paroxysmal atrial fibrillation: Secondary | ICD-10-CM | POA: Diagnosis not present

## 2015-03-22 DIAGNOSIS — I4891 Unspecified atrial fibrillation: Secondary | ICD-10-CM | POA: Insufficient documentation

## 2015-03-22 DIAGNOSIS — Z7982 Long term (current) use of aspirin: Secondary | ICD-10-CM | POA: Diagnosis not present

## 2015-03-22 DIAGNOSIS — R413 Other amnesia: Secondary | ICD-10-CM | POA: Diagnosis not present

## 2015-03-22 DIAGNOSIS — Z79899 Other long term (current) drug therapy: Secondary | ICD-10-CM | POA: Diagnosis not present

## 2015-03-22 DIAGNOSIS — I5022 Chronic systolic (congestive) heart failure: Secondary | ICD-10-CM | POA: Insufficient documentation

## 2015-03-22 DIAGNOSIS — I428 Other cardiomyopathies: Secondary | ICD-10-CM | POA: Insufficient documentation

## 2015-03-22 DIAGNOSIS — Z95810 Presence of automatic (implantable) cardiac defibrillator: Secondary | ICD-10-CM | POA: Insufficient documentation

## 2015-03-22 DIAGNOSIS — Z7901 Long term (current) use of anticoagulants: Secondary | ICD-10-CM | POA: Diagnosis not present

## 2015-03-22 DIAGNOSIS — N183 Chronic kidney disease, stage 3 (moderate): Secondary | ICD-10-CM | POA: Insufficient documentation

## 2015-03-22 DIAGNOSIS — Z952 Presence of prosthetic heart valve: Secondary | ICD-10-CM | POA: Diagnosis not present

## 2015-03-22 DIAGNOSIS — I447 Left bundle-branch block, unspecified: Secondary | ICD-10-CM | POA: Diagnosis not present

## 2015-03-22 LAB — COMPREHENSIVE METABOLIC PANEL
ALT: 118 U/L — ABNORMAL HIGH (ref 17–63)
ANION GAP: 10 (ref 5–15)
AST: 81 U/L — AB (ref 15–41)
Albumin: 3.4 g/dL — ABNORMAL LOW (ref 3.5–5.0)
Alkaline Phosphatase: 104 U/L (ref 38–126)
BILIRUBIN TOTAL: 0.8 mg/dL (ref 0.3–1.2)
BUN: 22 mg/dL — AB (ref 6–20)
CHLORIDE: 107 mmol/L (ref 101–111)
CO2: 25 mmol/L (ref 22–32)
Calcium: 8.8 mg/dL — ABNORMAL LOW (ref 8.9–10.3)
Creatinine, Ser: 1.05 mg/dL (ref 0.61–1.24)
Glucose, Bld: 103 mg/dL — ABNORMAL HIGH (ref 65–99)
POTASSIUM: 4.8 mmol/L (ref 3.5–5.1)
Sodium: 142 mmol/L (ref 135–145)
TOTAL PROTEIN: 6.5 g/dL (ref 6.5–8.1)

## 2015-03-22 LAB — TSH: TSH: 0.011 u[IU]/mL — AB (ref 0.350–4.500)

## 2015-03-22 LAB — T4, FREE: FREE T4: 3.86 ng/dL — AB (ref 0.61–1.12)

## 2015-03-22 MED ORDER — LISINOPRIL 10 MG PO TABS: 5.0000 mg | ORAL_TABLET | Freq: Two times a day (BID) | ORAL | Status: DC

## 2015-03-22 NOTE — Addendum Note (Signed)
Encounter addended by: Effie Berkshire, RN on: 03/22/2015  3:33 PM<BR>     Documentation filed: Dx Association, Patient Instructions Section, Orders

## 2015-03-22 NOTE — Progress Notes (Signed)
Patient ID: Jon Butler, male   DOB: [birth year], 80 y.o.   MRN: [id number]   Advanced Heart Failure Note   Patient ID: Jon Butler, male   DOB: 12/12/1935, 80 y.o.   MRN: [id number] PCP: [facility] EP: [facility] HF: [facility]  HPI: Jon Butler is a 80 yo male with a history of chronic systolic HF EF [id number] due to NICM s/p CRT-D (Medtronic), mechanical AVR (St Jude), LBBB, PAF S/P DC-CV 12/20/12, ETOH abuse and dementia and non-compliance.   Admitted to [facility] 10/10 through 10/26/12 with ADHF in setting of recurrent AF. Developed cardiogenic shock and VDRF   BB and ACE-I held and amiodarone gtt was started. Required short term Milrinone and lasix.Diuresed 26 pounds and transitioned to 40 mg daily. DC-CV on 10/20. Remains on Coumadin. D/C weight 206 pounds.   S/P successful DC-CV 12/20/12. Amio increased to 400 bid.   Has seen Jon. Sherron [facility] in Neurology for progressive cognitive decline in setting of ETOH abuse. Also had foot drop.   Admitted in 8/16 for respiratory failure due to PNA and HF in setting of recurrent AF. Underwent TEE/DC-CV with nearly immediate reversion to AF.Seen by EP and felt to have end-stage HF and rate control strategy recommended. Amio stopped. Also underwent botox injection in esophagus for esophageal spasm/dysphagia. Made DNR and ICD deactivated. Weight at home 193-194 (was discharged at 191 pound)   Saw Jon. Lovena [facility] in November 2016 and was in AFL paced back into NSR.  Returns for Heart Failure follow up.  Says he is feeling good. Weight down at home. Taking lasix 60 one day and 40 the next. SBP ~110 at home. HR in low 60s. No swelling, orthopnea or PND.  Functional status limited, but walking more around the house with a walker.  Not taking extra lasix. No longer drinking ETOH.   11/01/12 Labs INR > 8 10/26/12 K 3.0 Creatinine 1.23  12/16/12 K 4.6 Creatinine 1.05 12/20/12 K 4.0 Creatinine 1.04 11/15      K 4.4 Creatinine 1.1 5/16: K 4.3 Creatinine 0.94   6/16: INR 1.9  09/07/14: K 4.0, Cr 1.00 01/05/15 K 4.7 creatinine 0.9  ICD interrogation : Optivol stable and below threshold. Back in NSR. No VT. Activity levelincreased slightly ~ 1hour per day. 100% biv pacing   ROS: All systems negative except as listed in HPI, PMH and Problem List.  Past Medical History  Diagnosis Date  . Left bundle-branch block   . Cardiomyopathy   . Chronic systolic heart failure ([place])   . Atrial fibrillation ([place])   . Dementia   . Pneumonia 08/2014    Current Outpatient Prescriptions  Medication Sig Dispense Refill  . allopurinol (ZYLOPRIM) 300 MG tablet Take 300 mg by mouth daily.     Marland Kitchen amiodarone (PACERONE) 200 MG tablet Take 1 tablet (200 mg total) by mouth daily. 30 tablet 3  . amoxicillin (AMOXIL) 500 MG capsule Take 1 capsule (500 mg total) by mouth every other day. Restart after Antibiotic for UTI completed    . aspirin EC 81 MG tablet Take 1 tablet (81 mg total) by mouth daily.    . carvedilol (COREG) 3.125 MG tablet Take 3.125 mg by mouth 2 (two) times daily with a meal.    . colchicine 0.6 MG tablet Take 0.6 mg by mouth daily as needed (gout pain). Reported on 12/21/2014    . donepezil (ARICEPT) 23 MG TABS tablet Take 1 tablet (23 mg total) by mouth at bedtime. [place]  tablet 3  . fluticasone (FLONASE) 50 MCG/ACT nasal spray Place 2 sprays into both nostrils daily as needed for allergies or rhinitis. Reported on 12/21/2014    . furosemide (LASIX) 40 MG tablet Take 40 mg by mouth. Take 1 tab (40 mg) one day alternating with 1.5 tabs (60mg ) the next day continuously    . lisinopril (PRINIVIL,ZESTRIL) 10 MG tablet Take 0.5 tablets (5 mg total) by mouth at bedtime. 30 tablet 4  . LORazepam (ATIVAN) 0.5 MG tablet Take 1 tablet (0.5 mg total) by mouth every 8 (eight) hours as needed for anxiety. 20 tablet 0  . memantine (NAMENDA XR) 28 MG CP24 24 hr capsule Take 1 capsule (28 mg total) by mouth daily. 30 capsule 6  . metolazone (ZAROXOLYN) 2.5 MG tablet Take 1  tablet (2.5 mg total) by mouth daily. As needed for weight gain 30 tablet 3  . potassium chloride SA (K-DUR,KLOR-CON) 20 MEQ tablet Take 1 tablet (20 mEq total) by mouth daily. Take 1 extra tab when you take metolazone 30 tablet 3  . rosuvastatin (CRESTOR) 10 MG tablet Take 5 mg by mouth daily. Reported on 03/22/2015    . spironolactone (ALDACTONE) 25 MG tablet Take 0.5 tablets (12.5 mg total) by mouth daily. 30 tablet 3  . thiamine (VITAMIN B-1) 100 MG tablet Take 1 tablet (100 mg total) by mouth 2 (two) times daily. 60 tablet 6  . traZODone (DESYREL) 100 MG tablet Take 400 mg by mouth at bedtime.   2  . warfarin (COUMADIN) 2 MG tablet Take 1-1.5 tablets (2-3 mg total) by mouth daily at 6 PM. Take 2 mg (1 tablet) daily except on Sunday, Tuesday, and Friday. Take 3 mg (1.5 tablets) on Sunday, Tuesday, and Friday     No current facility-administered medications for this encounter.   Filed Vitals:   03/22/15 1444  BP: 111/67  Pulse: 70  Resp: 18  Weight: 175 lb 8 oz (79.606 kg)  SpO2: 95%     PHYSICAL EXAM: General:  Elderly. No resp difficulty Wife present HEENT: normal Neck: supple. JVP 6 cm.  Carotids 2+ bilaterally; no bruits. No lymphadenopathy or thryomegaly appreciated. Cor: PMI normal. RRR. Mechanical s2 No rubs, gallops 2/6 TR. Lungs: clear  Abdomen: soft, nontender,no distention. No HSM. No bruits or masses. Good bowel sounds. Extremities: no cyanosis, clubbing, rash, no edema Neuro: alert & orientedx3, cranial nerves grossly intact. Moves all 4 extremities w/o difficulty. Affect normal  ASSESSMENT & PLAN: 1. Chronic Systolic Heart Failure TEE with EF 15-20%  S/P Medtronic CRT-D 09/2012  - Volume status is stable on Optivol and exam.  - Continue lasix 40 daily alternating with 60 daily. New goal weight ~170 pounds - Improved NYHA III symptoms - Continue current meds with increase of lisinopril to 5 bid - Repeat echo at next visit 2. A Fib-  S/P DC-CV 12/20/12 but reverted  to AF. Had AFL in 11/1 and paced out.  - Remains in NSR today on exam and by ICD interrogation.  Continue amio at 200 daily.  - Continue coumadin for AF and AVR.  3. CKD stage 3- stable on recent labs  4. Alcohol- Reinforced alcohol abstinence especially with coumadin.  5. Memory loss-  Neurology following. On namenda and aricept. Seems better today.  6. Mechanical AVR - on coumadin. SBE prophylaxis for all procedures.    Glori Bickers, MD  3:05 PM

## 2015-03-22 NOTE — Patient Instructions (Signed)
INCREASE Lisinopril to 5 mg (1/2 tab) TWICE daily.  Routine lab work today. Will notify you of abnormal results, otherwise no news is good news!  Will schedule you for an echocardiogram at [facility]. Address: [address] ([place]), [place], [place] [address]  Phone: [phone]  Follow up 3 months with Dr. Haroldine Laws.  Do the following things EVERYDAY: 1) Weigh yourself in the morning before breakfast. Write it down and keep it in a log. 2) Take your medicines as prescribed 3) Eat low salt foods-Limit salt (sodium) to 2000 mg per day.  4) Stay as active as you can everyday 5) Limit all fluids for the day to less than 2 liters

## 2015-03-27 ENCOUNTER — Telehealth (HOSPITAL_COMMUNITY): Payer: Self-pay | Admitting: *Deleted

## 2015-03-27 NOTE — Telephone Encounter (Signed)
-----   Message from Jolaine Artist, MD sent at 03/23/2015 12:22 PM EDT ----- He has amio induced hyperthyroidism. Please refer to Dr. Carrolyn Meiers.

## 2015-03-27 NOTE — Telephone Encounter (Signed)
Notes Recorded by Scarlette Calico, RN on 03/27/2015 at 9:52 AM Pt's wife aware, called Dr [facility] office, referral faxed to them at [phone] [facility]

## 2015-03-28 NOTE — Telephone Encounter (Signed)
Per Barbera Setters, pt is sch to see Dr Forde Dandy 4/19 at 2:30, pt's wife is aware

## 2015-04-02 ENCOUNTER — Other Ambulatory Visit (HOSPITAL_COMMUNITY): Payer: Self-pay | Admitting: *Deleted

## 2015-04-02 MED ORDER — POTASSIUM CHLORIDE CRYS ER 20 MEQ PO TBCR: 20.0000 meq | EXTENDED_RELEASE_TABLET | Freq: Every day | ORAL | Status: DC

## 2015-04-03 ENCOUNTER — Telehealth (HOSPITAL_COMMUNITY): Payer: Self-pay

## 2015-04-03 NOTE — Telephone Encounter (Addendum)
Patient's wife called c/o fever, chest congestion, productive cough, and decreased O2 sats. States this started last night with 102 fever, patient generally fatigued, lots of chest congestion, productive cough with yellow/green thick sputum, and sats 92%. Wife gave patient extra amoxicillin (takes this chronically for "history of blood infection years ago" per wife) and tylenol. Temp now 99.0 (with tylenol on board), still productive congested cough, using incentive spirometer to break up congestion.  115/56  HR 61  sats now 94% Wife has already left message with PCP. Advised if she does nto hear anything by end of day from PCP to have patient evaluated at urgent care to r/o flu and/or PNA. Aware and agreeable to plan.  Renee Pain

## 2015-04-04 ENCOUNTER — Encounter (HOSPITAL_COMMUNITY): Payer: Self-pay | Admitting: *Deleted

## 2015-04-04 ENCOUNTER — Emergency Department (HOSPITAL_COMMUNITY): Payer: Medicare Other

## 2015-04-04 ENCOUNTER — Inpatient Hospital Stay (HOSPITAL_COMMUNITY)
Admission: EM | Admit: 2015-04-04 | Discharge: 2015-04-07 | DRG: 871 | Disposition: A | Discharge status: Home / Self Care | Payer: Medicare Other | Attending: Internal Medicine | Admitting: Internal Medicine

## 2015-04-04 DIAGNOSIS — I248 Other forms of acute ischemic heart disease: Secondary | ICD-10-CM | POA: Diagnosis present

## 2015-04-04 DIAGNOSIS — J189 Pneumonia, unspecified organism: Secondary | ICD-10-CM | POA: Diagnosis not present

## 2015-04-04 DIAGNOSIS — A419 Sepsis, unspecified organism: Principal | ICD-10-CM | POA: Diagnosis present

## 2015-04-04 DIAGNOSIS — M109 Gout, unspecified: Secondary | ICD-10-CM | POA: Diagnosis present

## 2015-04-04 DIAGNOSIS — I447 Left bundle-branch block, unspecified: Secondary | ICD-10-CM | POA: Diagnosis present

## 2015-04-04 DIAGNOSIS — I5022 Chronic systolic (congestive) heart failure: Secondary | ICD-10-CM | POA: Diagnosis not present

## 2015-04-04 DIAGNOSIS — N183 Chronic kidney disease, stage 3 (moderate): Secondary | ICD-10-CM | POA: Diagnosis present

## 2015-04-04 DIAGNOSIS — I480 Paroxysmal atrial fibrillation: Secondary | ICD-10-CM | POA: Diagnosis present

## 2015-04-04 DIAGNOSIS — D638 Anemia in other chronic diseases classified elsewhere: Secondary | ICD-10-CM | POA: Diagnosis present

## 2015-04-04 DIAGNOSIS — E785 Hyperlipidemia, unspecified: Secondary | ICD-10-CM | POA: Diagnosis present

## 2015-04-04 DIAGNOSIS — I4891 Unspecified atrial fibrillation: Secondary | ICD-10-CM | POA: Diagnosis not present

## 2015-04-04 DIAGNOSIS — Z95810 Presence of automatic (implantable) cardiac defibrillator: Secondary | ICD-10-CM | POA: Diagnosis not present

## 2015-04-04 DIAGNOSIS — Z7901 Long term (current) use of anticoagulants: Secondary | ICD-10-CM | POA: Diagnosis not present

## 2015-04-04 DIAGNOSIS — R197 Diarrhea, unspecified: Secondary | ICD-10-CM | POA: Diagnosis present

## 2015-04-04 DIAGNOSIS — F0390 Unspecified dementia without behavioral disturbance: Secondary | ICD-10-CM

## 2015-04-04 DIAGNOSIS — R030 Elevated blood-pressure reading, without diagnosis of hypertension: Secondary | ICD-10-CM | POA: Diagnosis not present

## 2015-04-04 DIAGNOSIS — Z792 Long term (current) use of antibiotics: Secondary | ICD-10-CM

## 2015-04-04 DIAGNOSIS — I429 Cardiomyopathy, unspecified: Secondary | ICD-10-CM | POA: Diagnosis present

## 2015-04-04 DIAGNOSIS — Z8249 Family history of ischemic heart disease and other diseases of the circulatory system: Secondary | ICD-10-CM | POA: Diagnosis not present

## 2015-04-04 DIAGNOSIS — F1010 Alcohol abuse, uncomplicated: Secondary | ICD-10-CM | POA: Diagnosis present

## 2015-04-04 DIAGNOSIS — I472 Ventricular tachycardia: Secondary | ICD-10-CM | POA: Diagnosis present

## 2015-04-04 DIAGNOSIS — Z66 Do not resuscitate: Secondary | ICD-10-CM | POA: Diagnosis present

## 2015-04-04 DIAGNOSIS — I5042 Chronic combined systolic (congestive) and diastolic (congestive) heart failure: Secondary | ICD-10-CM | POA: Diagnosis present

## 2015-04-04 DIAGNOSIS — J9601 Acute respiratory failure with hypoxia: Secondary | ICD-10-CM | POA: Diagnosis not present

## 2015-04-04 DIAGNOSIS — Z9119 Patient's noncompliance with other medical treatment and regimen: Secondary | ICD-10-CM | POA: Diagnosis not present

## 2015-04-04 DIAGNOSIS — R350 Frequency of micturition: Secondary | ICD-10-CM | POA: Diagnosis present

## 2015-04-04 DIAGNOSIS — Z952 Presence of prosthetic heart valve: Secondary | ICD-10-CM

## 2015-04-04 DIAGNOSIS — I482 Chronic atrial fibrillation: Secondary | ICD-10-CM | POA: Diagnosis not present

## 2015-04-04 DIAGNOSIS — G4700 Insomnia, unspecified: Secondary | ICD-10-CM | POA: Diagnosis present

## 2015-04-04 DIAGNOSIS — D696 Thrombocytopenia, unspecified: Secondary | ICD-10-CM | POA: Diagnosis present

## 2015-04-04 DIAGNOSIS — R0602 Shortness of breath: Secondary | ICD-10-CM | POA: Diagnosis not present

## 2015-04-04 DIAGNOSIS — N390 Urinary tract infection, site not specified: Secondary | ICD-10-CM | POA: Diagnosis present

## 2015-04-04 DIAGNOSIS — D72829 Elevated white blood cell count, unspecified: Secondary | ICD-10-CM | POA: Diagnosis not present

## 2015-04-04 DIAGNOSIS — I5043 Acute on chronic combined systolic (congestive) and diastolic (congestive) heart failure: Secondary | ICD-10-CM | POA: Insufficient documentation

## 2015-04-04 DIAGNOSIS — F419 Anxiety disorder, unspecified: Secondary | ICD-10-CM | POA: Diagnosis present

## 2015-04-04 DIAGNOSIS — I369 Nonrheumatic tricuspid valve disorder, unspecified: Secondary | ICD-10-CM | POA: Diagnosis not present

## 2015-04-04 DIAGNOSIS — Z79899 Other long term (current) drug therapy: Secondary | ICD-10-CM

## 2015-04-04 DIAGNOSIS — Z7982 Long term (current) use of aspirin: Secondary | ICD-10-CM | POA: Diagnosis not present

## 2015-04-04 DIAGNOSIS — R05 Cough: Secondary | ICD-10-CM | POA: Diagnosis not present

## 2015-04-04 DIAGNOSIS — R509 Fever, unspecified: Secondary | ICD-10-CM | POA: Diagnosis not present

## 2015-04-04 LAB — URINALYSIS, ROUTINE W REFLEX MICROSCOPIC
BILIRUBIN URINE: NEGATIVE
Glucose, UA: NEGATIVE mg/dL
Hgb urine dipstick: NEGATIVE
KETONES UR: NEGATIVE mg/dL
NITRITE: NEGATIVE
PH: 5 (ref 5.0–8.0)
Protein, ur: NEGATIVE mg/dL
Specific Gravity, Urine: 1.02 (ref 1.005–1.030)

## 2015-04-04 LAB — I-STAT CG4 LACTIC ACID, ED: LACTIC ACID, VENOUS: 2.27 mmol/L — AB (ref 0.5–2.0)

## 2015-04-04 LAB — PROCALCITONIN: Procalcitonin: 1.69 ng/mL

## 2015-04-04 LAB — COMPREHENSIVE METABOLIC PANEL
ALK PHOS: 123 U/L (ref 38–126)
ALT: 119 U/L — ABNORMAL HIGH (ref 17–63)
ANION GAP: 13 (ref 5–15)
AST: 95 U/L — ABNORMAL HIGH (ref 15–41)
Albumin: 2.8 g/dL — ABNORMAL LOW (ref 3.5–5.0)
BILIRUBIN TOTAL: 1.4 mg/dL — AB (ref 0.3–1.2)
BUN: 45 mg/dL — ABNORMAL HIGH (ref 6–20)
CALCIUM: 8.5 mg/dL — AB (ref 8.9–10.3)
CO2: 20 mmol/L — ABNORMAL LOW (ref 22–32)
Chloride: 107 mmol/L (ref 101–111)
Creatinine, Ser: 1.07 mg/dL (ref 0.61–1.24)
Glucose, Bld: 235 mg/dL — ABNORMAL HIGH (ref 65–99)
Potassium: 5 mmol/L (ref 3.5–5.1)
Sodium: 140 mmol/L (ref 135–145)
TOTAL PROTEIN: 6 g/dL — AB (ref 6.5–8.1)

## 2015-04-04 LAB — CBC WITH DIFFERENTIAL/PLATELET
BASOS ABS: 0 10*3/uL (ref 0.0–0.1)
BASOS PCT: 0 %
Eosinophils Absolute: 0 10*3/uL (ref 0.0–0.7)
Eosinophils Relative: 0 %
HEMATOCRIT: 40.9 % (ref 39.0–52.0)
HEMOGLOBIN: 13.2 g/dL (ref 13.0–17.0)
Lymphocytes Relative: 8 %
Lymphs Abs: 0.9 10*3/uL (ref 0.7–4.0)
MCH: 30.6 pg (ref 26.0–34.0)
MCHC: 32.3 g/dL (ref 30.0–36.0)
MCV: 94.7 fL (ref 78.0–100.0)
MONO ABS: 0.9 10*3/uL (ref 0.1–1.0)
Monocytes Relative: 7 %
NEUTROS ABS: 9.9 10*3/uL — AB (ref 1.7–7.7)
NEUTROS PCT: 85 %
Platelets: 121 10*3/uL — ABNORMAL LOW (ref 150–400)
RBC: 4.32 MIL/uL (ref 4.22–5.81)
RDW: 15.2 % (ref 11.5–15.5)
WBC: 11.7 10*3/uL — AB (ref 4.0–10.5)

## 2015-04-04 LAB — POCT I-STAT 3, ART BLOOD GAS (G3+)
Acid-base deficit: 3 mmol/L — ABNORMAL HIGH (ref 0.0–2.0)
Bicarbonate: 23.8 mEq/L (ref 20.0–24.0)
O2 Saturation: 99 %
PCO2 ART: 49 mmHg — AB (ref 35.0–45.0)
PH ART: 7.295 — AB (ref 7.350–7.450)
TCO2: 25 mmol/L (ref 0–100)
pO2, Arterial: 133 mmHg — ABNORMAL HIGH (ref 80.0–100.0)

## 2015-04-04 LAB — INFLUENZA PANEL BY PCR (TYPE A & B)
H1N1 flu by pcr: NOT DETECTED
INFLBPCR: NEGATIVE
Influenza A By PCR: NEGATIVE

## 2015-04-04 LAB — URINE MICROSCOPIC-ADD ON
RBC / HPF: NONE SEEN RBC/hpf (ref 0–5)
SQUAMOUS EPITHELIAL / LPF: NONE SEEN

## 2015-04-04 LAB — MAGNESIUM: MAGNESIUM: 1.9 mg/dL (ref 1.7–2.4)

## 2015-04-04 LAB — PHOSPHORUS: PHOSPHORUS: 4.1 mg/dL (ref 2.5–4.6)

## 2015-04-04 LAB — MRSA PCR SCREENING: MRSA BY PCR: NEGATIVE

## 2015-04-04 LAB — PROTIME-INR
INR: 3.01 — ABNORMAL HIGH (ref 0.00–1.49)
Prothrombin Time: 30.7 seconds — ABNORMAL HIGH (ref 11.6–15.2)

## 2015-04-04 LAB — TROPONIN I: TROPONIN I: 0.25 ng/mL — AB (ref <0.031)

## 2015-04-04 MED ORDER — AZITHROMYCIN 500 MG IV SOLR
500.0000 mg | INTRAVENOUS | Status: DC
  Administered 2015-04-05 – 2015-04-06 (×2): 500 mg via INTRAVENOUS
  Filled 2015-04-04 (×4): qty 500

## 2015-04-04 MED ORDER — DEXTROSE 5 % IV SOLN
1.0000 g | INTRAVENOUS | Status: DC
  Administered 2015-04-05 – 2015-04-06 (×2): 1 g via INTRAVENOUS
  Filled 2015-04-04 (×4): qty 10

## 2015-04-04 MED ORDER — DONEPEZIL HCL 23 MG PO TABS
23.0000 mg | ORAL_TABLET | Freq: Every day | ORAL | Status: DC
  Administered 2015-04-04 – 2015-04-06 (×3): 23 mg via ORAL
  Filled 2015-04-04 (×5): qty 1

## 2015-04-04 MED ORDER — ONDANSETRON HCL 4 MG/2ML IJ SOLN: 4.0000 mg | Freq: Four times a day (QID) | INTRAMUSCULAR | Status: DC | PRN

## 2015-04-04 MED ORDER — SODIUM CHLORIDE 0.9 % IV SOLN
Freq: Once | INTRAVENOUS | Status: AC
  Administered 2015-04-04: 18:00:00 via INTRAVENOUS

## 2015-04-04 MED ORDER — SODIUM CHLORIDE 0.9 % IV SOLN
INTRAVENOUS | Status: DC
  Administered 2015-04-04 – 2015-04-05 (×2): via INTRAVENOUS

## 2015-04-04 MED ORDER — PIPERACILLIN-TAZOBACTAM 3.375 G IVPB 30 MIN
3.3750 g | Freq: Once | INTRAVENOUS | Status: AC
  Administered 2015-04-04: 3.375 g via INTRAVENOUS
  Filled 2015-04-04: qty 50

## 2015-04-04 MED ORDER — ENSURE ENLIVE PO LIQD
237.0000 mL | Freq: Two times a day (BID) | ORAL | Status: DC
  Administered 2015-04-05 – 2015-04-06 (×3): 237 mL via ORAL

## 2015-04-04 MED ORDER — ACETAMINOPHEN 325 MG PO TABS: 650.0000 mg | ORAL_TABLET | Freq: Four times a day (QID) | ORAL | Status: DC | PRN

## 2015-04-04 MED ORDER — ROSUVASTATIN CALCIUM 10 MG PO TABS
5.0000 mg | ORAL_TABLET | Freq: Every day | ORAL | Status: DC
  Administered 2015-04-04 – 2015-04-07 (×4): 5 mg via ORAL
  Filled 2015-04-04 (×7): qty 1

## 2015-04-04 MED ORDER — AMIODARONE HCL 200 MG PO TABS
200.0000 mg | ORAL_TABLET | Freq: Every day | ORAL | Status: DC
  Administered 2015-04-04 – 2015-04-07 (×4): 200 mg via ORAL
  Filled 2015-04-04 (×4): qty 1

## 2015-04-04 MED ORDER — DILTIAZEM HCL 25 MG/5ML IV SOLN
5.0000 mg | Freq: Once | INTRAVENOUS | Status: DC | PRN
  Filled 2015-04-04: qty 5

## 2015-04-04 MED ORDER — VANCOMYCIN HCL IN DEXTROSE 750-5 MG/150ML-% IV SOLN
750.0000 mg | Freq: Two times a day (BID) | INTRAVENOUS | Status: DC
  Filled 2015-04-04: qty 150

## 2015-04-04 MED ORDER — LORAZEPAM 0.5 MG PO TABS
0.5000 mg | ORAL_TABLET | Freq: Three times a day (TID) | ORAL | Status: DC | PRN
  Administered 2015-04-06: 0.5 mg via ORAL
  Filled 2015-04-04: qty 1

## 2015-04-04 MED ORDER — DEXTROSE 5 % IV SOLN
500.0000 mg | Freq: Once | INTRAVENOUS | Status: AC
  Administered 2015-04-04: 500 mg via INTRAVENOUS
  Filled 2015-04-04: qty 500

## 2015-04-04 MED ORDER — ACETAMINOPHEN 650 MG RE SUPP: 650.0000 mg | Freq: Four times a day (QID) | RECTAL | Status: DC | PRN

## 2015-04-04 MED ORDER — ALLOPURINOL 300 MG PO TABS
300.0000 mg | ORAL_TABLET | Freq: Every day | ORAL | Status: DC
Start: 2015-04-05 — End: 2015-04-07
  Administered 2015-04-05 – 2015-04-07 (×3): 300 mg via ORAL
  Filled 2015-04-04 (×2): qty 3
  Filled 2015-04-04: qty 1

## 2015-04-04 MED ORDER — SODIUM CHLORIDE 0.9 % IV BOLUS (SEPSIS)
1000.0000 mL | INTRAVENOUS | Status: AC
  Administered 2015-04-04: 1000 mL via INTRAVENOUS

## 2015-04-04 MED ORDER — OXYCODONE HCL 5 MG PO TABS: 5.0000 mg | ORAL_TABLET | ORAL | Status: DC | PRN

## 2015-04-04 MED ORDER — WARFARIN 0.5 MG HALF TABLET
0.5000 mg | ORAL_TABLET | Freq: Once | ORAL | Status: AC
  Administered 2015-04-04: 0.5 mg via ORAL
  Filled 2015-04-04 (×2): qty 1

## 2015-04-04 MED ORDER — ONDANSETRON HCL 4 MG PO TABS: 4.0000 mg | ORAL_TABLET | Freq: Four times a day (QID) | ORAL | Status: DC | PRN

## 2015-04-04 MED ORDER — ACETAMINOPHEN 500 MG PO TABS
1000.0000 mg | ORAL_TABLET | Freq: Once | ORAL | Status: AC
  Administered 2015-04-04: 1000 mg via ORAL
  Filled 2015-04-04: qty 2

## 2015-04-04 MED ORDER — MEMANTINE HCL ER 28 MG PO CP24
28.0000 mg | ORAL_CAPSULE | Freq: Every day | ORAL | Status: DC
  Administered 2015-04-05 – 2015-04-06 (×2): 28 mg via ORAL
  Filled 2015-04-04 (×6): qty 1

## 2015-04-04 MED ORDER — ASPIRIN EC 81 MG PO TBEC
81.0000 mg | DELAYED_RELEASE_TABLET | Freq: Every day | ORAL | Status: DC
  Administered 2015-04-04 – 2015-04-07 (×4): 81 mg via ORAL
  Filled 2015-04-04 (×4): qty 1

## 2015-04-04 MED ORDER — DEXTROSE 5 % IV SOLN
1.0000 g | Freq: Once | INTRAVENOUS | Status: AC
  Administered 2015-04-04: 1 g via INTRAVENOUS
  Filled 2015-04-04: qty 10

## 2015-04-04 MED ORDER — SODIUM CHLORIDE 0.9 % IV BOLUS (SEPSIS)
500.0000 mL | INTRAVENOUS | Status: AC
  Administered 2015-04-04: 500 mL via INTRAVENOUS

## 2015-04-04 MED ORDER — SODIUM CHLORIDE 0.9% FLUSH
3.0000 mL | Freq: Two times a day (BID) | INTRAVENOUS | Status: DC
  Administered 2015-04-04 – 2015-04-05 (×3): 3 mL via INTRAVENOUS

## 2015-04-04 MED ORDER — VANCOMYCIN HCL IN DEXTROSE 1-5 GM/200ML-% IV SOLN
1000.0000 mg | Freq: Once | INTRAVENOUS | Status: AC
  Administered 2015-04-04: 1000 mg via INTRAVENOUS
  Filled 2015-04-04: qty 200

## 2015-04-04 MED ORDER — TRAZODONE HCL 150 MG PO TABS
400.0000 mg | ORAL_TABLET | Freq: Every day | ORAL | Status: DC
  Administered 2015-04-04 – 2015-04-06 (×3): 400 mg via ORAL
  Filled 2015-04-04 (×3): qty 4
  Filled 2015-04-04: qty 1
  Filled 2015-04-04: qty 4

## 2015-04-04 MED ORDER — PIPERACILLIN-TAZOBACTAM 3.375 G IVPB
3.3750 g | Freq: Three times a day (TID) | INTRAVENOUS | Status: DC
  Filled 2015-04-04 (×2): qty 50

## 2015-04-04 MED ORDER — WARFARIN - PHARMACIST DOSING INPATIENT
Freq: Every day | Status: DC
  Administered 2015-04-06: 18:00:00

## 2015-04-04 NOTE — ED Notes (Signed)
Pt arrives via EMS from home with 2 day hx of fever and loose stools. Hypoxic on EMS arrival. Pt alert, hx of alzheimers, per wife at baseline. Pt meets code sepsis criteria. Will begin protocols.

## 2015-04-04 NOTE — Progress Notes (Signed)
Result of abg  seen by md,, with order.

## 2015-04-04 NOTE — H&P (Addendum)
[facility] History and Physical  Jon Butler [id number] DOB: 1935/01/18 DOA: 04/04/2015  Referring physician: Lorre Munroe - PA PCP: [name], MD   Chief Complaint:  CHills and productive cough  HPI: Jon Butler is a 80 y.o. male  Level 5 caveat: Patient presenting with baseline dementia and unable to provide reliable history. History provided on a limited basis by the patient, but a greater detail by EDP and patient's wife. Patient has had a 2 to three-day history of progressive worsening cough that is productive with large amounts of phlegm and fluid per wife, chills, fever up to 102. Patient's wife's states that Tylenol was given with some relief. Somewhat diminished appetite. Increased work of breathing. Patient has been on a every OD amoxicillin 500 mg 06/24/1988, as prescribed after having heart surgery. Wife gave patient 500 mg of amoxicillin 3 times a day yesterday without improvement. Denies diarrhea, rash, chest pain.  Review of Systems:  Unable to obtain further review systems due to patient's mental status.  Past Medical History  Diagnosis Date  . Left bundle-branch block   . Cardiomyopathy   . Chronic systolic heart failure ([place])   . Atrial fibrillation ([place])   . Dementia   . Pneumonia 08/2014   Past Surgical History  Procedure Laterality Date  . Doppler echocardiography  2008, 2009  . Aortic valve replacement    . Tee without cardioversion N/A 10/15/2012    Procedure: TRANSESOPHAGEAL ECHOCARDIOGRAM (TEE);  Surgeon: Fay Records, MD;  Location: [facility] ENDOSCOPY;  Service: Cardiovascular;  Laterality: N/A;  . Cardioversion N/A 10/25/2012    Procedure: CARDIOVERSION;  Surgeon: Evans Lance, MD;  Location: [place];  Service: Cardiovascular;  Laterality: N/A;  . Cardioversion N/A 12/20/2012    Procedure: CARDIOVERSION;  Surgeon: Larey Dresser, MD;  Location: [place];  Service: Cardiovascular;  Laterality: N/A;  . Implantable cardioverter  defibrillator generator change N/A 09/20/2012    Procedure: IMPLANTABLE CARDIOVERTER DEFIBRILLATOR GENERATOR CHANGE;  Surgeon: Evans Lance, MD;  Location: [facility] CATH LAB;  Service: Cardiovascular;  Laterality: N/A;  . Tee without cardioversion N/A 08/10/2014    Procedure: TRANSESOPHAGEAL ECHOCARDIOGRAM (TEE);  Surgeon: Sanda Klein, MD;  Location: [place];  Service: Cardiovascular;  Laterality: N/A;  . Cardioversion N/A 08/10/2014    Procedure: CARDIOVERSION;  Surgeon: Sanda Klein, MD;  Location: [facility] ENDOSCOPY;  Service: Cardiovascular;  Laterality: N/A;  . Esophagogastroduodenoscopy (egd) with propofol N/A 08/18/2014    Procedure: ESOPHAGOGASTRODUODENOSCOPY (EGD) WITH PROPOFOL;  Surgeon: Wonda Horner, MD;  Location: [facility] ENDOSCOPY;  Service: Endoscopy;  Laterality: N/A;  . Botox injection N/A 08/18/2014    Procedure: BOTOX INJECTION;  Surgeon: Wonda Horner, MD;  Location: [facility] ENDOSCOPY;  Service: Endoscopy;  Laterality: N/A;   Social History:  reports that he has never smoked. He has never used smokeless tobacco. He reports that he drinks alcohol. He reports that he does not use illicit drugs.  No Known Allergies  Family History  Problem Relation Age of Onset  . Aneurysm Mother     brain  . Heart attack Father 18     Prior to Admission medications   Medication Sig Start Date End Date Taking? Authorizing Provider  allopurinol (ZYLOPRIM) 300 MG tablet Take 300 mg by mouth daily.    Yes Historical Provider, MD  amiodarone (PACERONE) 200 MG tablet Take 1 tablet (200 mg total) by mouth daily. 09/07/14  Yes Jolaine Artist, MD  amoxicillin (AMOXIL) 500 MG capsule Take 1 capsule (500 mg total)  by mouth every other day. Restart after Antibiotic for UTI completed 11/24/14  Yes Domenic Polite, MD  aspirin EC 81 MG tablet Take 1 tablet (81 mg total) by mouth daily. 10/26/12  Yes Brooke O Edmisten, PA-C  carvedilol (COREG) 3.125 MG tablet Take 3.125 mg by mouth 2 (two) times daily with a meal.    Yes Historical Provider, MD  colchicine 0.6 MG tablet Take 0.6 mg by mouth daily as needed (gout pain). Reported on 12/21/2014   Yes Historical Provider, MD  donepezil (ARICEPT) 23 MG TABS tablet Take 1 tablet (23 mg total) by mouth at bedtime. 11/07/14  Yes Penni Bombard, MD  fluticasone (FLONASE) 50 MCG/ACT nasal spray Place 2 sprays into both nostrils daily as needed for allergies or rhinitis. Reported on 12/21/2014 08/17/13  Yes Historical Provider, MD  furosemide (LASIX) 40 MG tablet Take 40 mg by mouth. Take 1 tab (40 mg) one day alternating with 1.5 tabs (60mg ) the next day continuously   Yes Historical Provider, MD  lisinopril (PRINIVIL,ZESTRIL) 10 MG tablet Take 0.5 tablets (5 mg total) by mouth 2 (two) times daily. 03/22/15  Yes Jolaine Artist, MD  LORazepam (ATIVAN) 0.5 MG tablet Take 1 tablet (0.5 mg total) by mouth every 8 (eight) hours as needed for anxiety. 08/21/14  Yes Eugenie Filler, MD  memantine (NAMENDA XR) 28 MG CP24 24 hr capsule Take 1 capsule (28 mg total) by mouth daily. 04/20/14  Yes Penni Bombard, MD  metolazone (ZAROXOLYN) 2.5 MG tablet Take 1 tablet (2.5 mg total) by mouth daily. As needed for weight gain 02/20/15  Yes Jolaine Artist, MD  potassium chloride SA (K-DUR,KLOR-CON) 20 MEQ tablet Take 1 tablet (20 mEq total) by mouth daily. Take 1 extra tab when you take metolazone 04/02/15  Yes Jolaine Artist, MD  rosuvastatin (CRESTOR) 10 MG tablet Take 5 mg by mouth daily. Reported on 03/22/2015   Yes Historical Provider, MD  spironolactone (ALDACTONE) 25 MG tablet Take 0.5 tablets (12.5 mg total) by mouth daily. 10/27/12  Yes Evans Lance, MD  thiamine (VITAMIN B-1) 100 MG tablet Take 1 tablet (100 mg total) by mouth 2 (two) times daily. 12/16/12  Yes Amy D Clegg, NP  traZODone (DESYREL) 100 MG tablet Take 400 mg by mouth at bedtime.  06/29/14  Yes Historical Provider, MD  warfarin (COUMADIN) 2 MG tablet Take 1-1.5 tablets (2-3 mg total) by mouth daily at 6  PM. Take 2 mg (1 tablet) daily except on Sunday, Tuesday, and Friday. Take 3 mg (1.5 tablets) on Sunday, Tuesday, and Friday 11/24/14  Yes Domenic Polite, MD   Physical Exam: Filed Vitals:   04/04/15 1400 04/04/15 1445 04/04/15 1515 04/04/15 1615  BP: 126/61 91/64 103/72 104/56  Pulse: 92 94 85 80  Temp:      TempSrc:      Resp:  20 31 28   Weight:      SpO2: 98% 100% 97% 98%    Wt Readings from Last 3 Encounters:  04/04/15 73.846 kg (162 lb 12.8 oz)  03/22/15 79.606 kg (175 lb 8 oz)  02/27/15 79.652 kg (175 lb 9.6 oz)    General: resting in bed. In distress Eyes:  PERRL, EOMI, normal lids, iris ENT:  grossly normal hearing, lips & tongue Neck:  no LAD, masses or thyromegaly Cardiovascular:  Irregularly irregular, II/VI systolic murmur. trace LE edema.  Respiratory:  Increased effort, on 2 L nasal cannula, rhonchi or crackles and diminished breath sounds in the  right lower lung Abdomen:  soft, ntnd Skin:  no rash or induration seen on limited exam Musculoskeletal:  grossly normal tone BUE/BLE Psychiatric: Alert and oriented to person and place, answers simple questions appropriately but struggles with long-term details greater than 1 day in length. Pleasant. Neurologic:  CN 2-12 grossly intact, moves all extremities in coordinated fashion.          Labs on Admission:  Basic Metabolic Panel:  Recent Labs Lab 04/04/15 1339  NA 140  K 5.0  CL 107  CO2 20*  GLUCOSE 235*  BUN 45*  CREATININE 1.07  CALCIUM 8.5*   Liver Function Tests:  Recent Labs Lab 04/04/15 1339  AST 95*  ALT 119*  ALKPHOS 123  BILITOT 1.4*  PROT 6.0*  ALBUMIN 2.8*   No results for input(s): LIPASE, AMYLASE in the last 168 hours. No results for input(s): AMMONIA in the last 168 hours. CBC:  Recent Labs Lab 04/04/15 1339  WBC 11.7*  NEUTROABS 9.9*  HGB 13.2  HCT 40.9  MCV 94.7  PLT 121*   Cardiac Enzymes: No results for input(s): CKTOTAL, CKMB, CKMBINDEX, TROPONINI in the last  168 hours.  BNP (last 3 results)  Recent Labs  08/09/14 0745 12/21/14 1611  BNP 2479.2* 1707.6*    ProBNP (last 3 results) No results for input(s): PROBNP in the last 8760 hours.   CREATININE: 1.07 (04/04/15 1339) Estimated creatinine clearance - 58.8 mL/min  CBG: No results for input(s): GLUCAP in the last 168 hours.  Radiological Exams on Admission: Dg Chest Port 1 View  04/04/2015  CLINICAL DATA:  Fever, hypoxia EXAM: PORTABLE CHEST 1 VIEW COMPARISON:  11/22/2014 FINDINGS: Cardiomediastinal silhouette is stable. Three leads cardiac pacemaker is unchanged in position. There is infiltrate/ pneumonia right lower lobe. Follow-up to resolution after appropriate treatment is recommended. No pulmonary edema. Atherosclerotic calcifications of thoracic aorta again noted. IMPRESSION: Infiltrate/ pneumonia right lower lobe. Follow-up to resolution after appropriate treatment is recommended. Electronically Signed   By: Lahoma Crocker M.D.   On: 04/04/2015 14:00     Assessment/Plan Active Problems:   Biventricular implantable cardioverter-defibrillator in situ   Atrial fibrillation (HCC)   Acute respiratory failure with hypoxia ([place])   Community acquired pneumonia   Sepsis ([place])   UTI (urinary tract infection)   Dementia without behavioral disturbance   Sepsis: Likely secondary to CAP. Temp 102.3, tachypnea, hypoxic to 78%, lactic acid 2.27, WBC 11.7 with left shift, CXR concerning for right lower lobe pneumonia and a UA concerning for UTI. Code sepsis initiated by ED. Patient with baseline dementia so difficult to ascertain if this has affected his mental status. BP initially at baseline, downtrended but stabilized after 1.5L - history of CHF adn DNR. Pt on Chronic Amoxicillin (QOD since heart surgery in 1990).  - Stepdown - CTX and Azithro - f/u BCX, UCX - Trend lactic acid - pneumonia/sepsis order sets - ABG - CXR in am -  IVF NS 129ml/hr (may need additional bolus if BP drops  below 100) - hold home Amoxicillin - f/u Influenza   Acute respiratory failure w/ hypoxemia: O2 78% on presentation. Likely secondary to #1. O2 applied and patient with normal O2 sats at 2 L nasal cannula - Treatment as above  Chronic systolic diastolic CHF: Last echo showing EF of 25%. Also with history of atrial fibrillation with defibrillator in place. No evidence of acute decompensation the patient receiving aggressive IV hydration. - Hold IV Lasix and spironolactone due to hypotension - Hold potassium -  Resume ACE inhibitor and beta blocker when stable - I/O, dly wts - Echo  Afib: rate controlled. Chronic. EKG changes worrisome for ACS but per Cards, OK. - continue warfarin, amio - INR - continue bblocker in am when able - Coreg IV PRN HR >120 sustained - Cycle troponins  Dementia: at baseline. Pts wife is POA - continue namenda and aricept  Anxiety: - continue ativan  Insomnia: - continue trazodone  HLD: - continue statin  Gout: - continue allopurinol    Code Status: DNR  DVT Prophylaxis: Coumadin Family Communication: wife - POA Disposition Plan: Pending Improvement    Coyle Stordahl J, MD Family Medicine [facility] [email or web address] Password [email or web address]

## 2015-04-04 NOTE — Progress Notes (Signed)
Pharmacy Antibiotic Note Jon Butler is a 80 y.o. male admitted on 04/04/2015 with sepsis. Pharmacy has been consulted for Zosyn and vancomycin dosing. Received Zosyn 3.375 gram and vancomycin 1000 mg x 1.  Plan: 1. Zosyn 3.375g IV q8h (4 hour infusion).  2. Vancomycin 750 mg Q 12 hours starting at 22:00 tonight 3. Level at [facility] if continued; goal 15-20  Weight: 162 lb 12.8 oz (73.846 kg)  Temp (24hrs), Avg:102.3 F (39.1 C), Min:102.3 F (39.1 C), Max:102.3 F (39.1 C)   Recent Labs Lab 04/04/15 1339 04/04/15 1347  WBC 11.7*  --   CREATININE 1.07  --   LATICACIDVEN  --  2.27*    Estimated Creatinine Clearance: 58.8 mL/min (by C-G formula based on Cr of 1.07).    No Known Allergies  Antimicrobials this admission: 3/29 Zosyn >>  3/29 Vancomycin  >>   Dose adjustments this admission: n/a  Microbiology results: 3/29 BCx: px 3/29 UCx: px  3/29 Influenza: px   Thank you for allowing pharmacy to be a part of this patient's care.  Duayne Cal 04/04/2015 2:41 PM

## 2015-04-04 NOTE — ED Notes (Signed)
Attempted report 

## 2015-04-04 NOTE — ED Provider Notes (Signed)
CSN: [id number]     Arrival date & time 04/04/15  1248 History   First MD Initiated Contact with Patient 04/04/15 1310     Chief Complaint  Patient presents with  . Code Sepsis     (Consider location/radiation/quality/duration/timing/severity/associated sxs/prior Treatment) HPI Comments: Patient with past medical history remarkable for A. fib, heart failure, and dementia presents emergency department via EMS with chief complaint of fever and diarrhea. Patient is coming in by his spouse, who states that his mental status is baseline. She reports that he has been ill for the past 2 days. Has tried taking OTC antipyretics, otherwise not tried taking anything for his symptoms. There are no modifying factors. Patient has had a productive cough, and also reports having had some urinary frequency and suprapubic pain.  The history is provided by the patient. No language interpreter was used.    Past Medical History  Diagnosis Date  . Left bundle-branch block   . Cardiomyopathy   . Chronic systolic heart failure ([place])   . Atrial fibrillation ([place])   . Dementia   . Pneumonia 08/2014   Past Surgical History  Procedure Laterality Date  . Doppler echocardiography  2008, 2009  . Aortic valve replacement    . Tee without cardioversion N/A 10/15/2012    Procedure: TRANSESOPHAGEAL ECHOCARDIOGRAM (TEE);  Surgeon: Fay Records, MD;  Location: [facility] ENDOSCOPY;  Service: Cardiovascular;  Laterality: N/A;  . Cardioversion N/A 10/25/2012    Procedure: CARDIOVERSION;  Surgeon: Evans Lance, MD;  Location: [place];  Service: Cardiovascular;  Laterality: N/A;  . Cardioversion N/A 12/20/2012    Procedure: CARDIOVERSION;  Surgeon: Larey Dresser, MD;  Location: [place];  Service: Cardiovascular;  Laterality: N/A;  . Implantable cardioverter defibrillator generator change N/A 09/20/2012    Procedure: IMPLANTABLE CARDIOVERTER DEFIBRILLATOR GENERATOR CHANGE;  Surgeon: Evans Lance, MD;  Location: [facility] CATH LAB;   Service: Cardiovascular;  Laterality: N/A;  . Tee without cardioversion N/A 08/10/2014    Procedure: TRANSESOPHAGEAL ECHOCARDIOGRAM (TEE);  Surgeon: Sanda Klein, MD;  Location: [place];  Service: Cardiovascular;  Laterality: N/A;  . Cardioversion N/A 08/10/2014    Procedure: CARDIOVERSION;  Surgeon: Sanda Klein, MD;  Location: [facility] ENDOSCOPY;  Service: Cardiovascular;  Laterality: N/A;  . Esophagogastroduodenoscopy (egd) with propofol N/A 08/18/2014    Procedure: ESOPHAGOGASTRODUODENOSCOPY (EGD) WITH PROPOFOL;  Surgeon: Wonda Horner, MD;  Location: [facility] ENDOSCOPY;  Service: Endoscopy;  Laterality: N/A;  . Botox injection N/A 08/18/2014    Procedure: BOTOX INJECTION;  Surgeon: Wonda Horner, MD;  Location: [facility] ENDOSCOPY;  Service: Endoscopy;  Laterality: N/A;   Family History  Problem Relation Age of Onset  . Aneurysm Mother     brain  . Heart attack Father 59   Social History  Substance Use Topics  . Smoking status: Never Smoker   . Smokeless tobacco: Never Used  . Alcohol Use: 0.0 oz/week    2-3 Glasses of wine per week     Comment: 2-3 glasses    Review of Systems  Constitutional: Positive for fever and chills.  Respiratory: Positive for cough. Negative for shortness of breath.   Cardiovascular: Negative for chest pain.  Gastrointestinal: Negative for nausea, vomiting, diarrhea and constipation.  Genitourinary: Positive for frequency. Negative for dysuria.  All other systems reviewed and are negative.     Allergies  Review of patient's allergies indicates no known allergies.  Home Medications   Prior to Admission medications   Medication Sig Start Date End Date Taking? Authorizing  Provider  allopurinol (ZYLOPRIM) 300 MG tablet Take 300 mg by mouth daily.     Historical Provider, MD  amiodarone (PACERONE) 200 MG tablet Take 1 tablet (200 mg total) by mouth daily. 09/07/14   Jolaine Artist, MD  amoxicillin (AMOXIL) 500 MG capsule Take 1 capsule (500 mg total) by mouth  every other day. Restart after Antibiotic for UTI completed 11/24/14   Domenic Polite, MD  aspirin EC 81 MG tablet Take 1 tablet (81 mg total) by mouth daily. 10/26/12   Brooke O Edmisten, PA-C  carvedilol (COREG) 3.125 MG tablet Take 3.125 mg by mouth 2 (two) times daily with a meal.    Historical Provider, MD  colchicine 0.6 MG tablet Take 0.6 mg by mouth daily as needed (gout pain). Reported on 12/21/2014    Historical Provider, MD  donepezil (ARICEPT) 23 MG TABS tablet Take 1 tablet (23 mg total) by mouth at bedtime. 11/07/14   Penni Bombard, MD  fluticasone (FLONASE) 50 MCG/ACT nasal spray Place 2 sprays into both nostrils daily as needed for allergies or rhinitis. Reported on 12/21/2014 08/17/13   Historical Provider, MD  furosemide (LASIX) 40 MG tablet Take 40 mg by mouth. Take 1 tab (40 mg) one day alternating with 1.5 tabs (60mg ) the next day continuously    Historical Provider, MD  lisinopril (PRINIVIL,ZESTRIL) 10 MG tablet Take 0.5 tablets (5 mg total) by mouth 2 (two) times daily. 03/22/15   Jolaine Artist, MD  LORazepam (ATIVAN) 0.5 MG tablet Take 1 tablet (0.5 mg total) by mouth every 8 (eight) hours as needed for anxiety. 08/21/14   Eugenie Filler, MD  memantine (NAMENDA XR) 28 MG CP24 24 hr capsule Take 1 capsule (28 mg total) by mouth daily. 04/20/14   Penni Bombard, MD  metolazone (ZAROXOLYN) 2.5 MG tablet Take 1 tablet (2.5 mg total) by mouth daily. As needed for weight gain 02/20/15   Jolaine Artist, MD  potassium chloride SA (K-DUR,KLOR-CON) 20 MEQ tablet Take 1 tablet (20 mEq total) by mouth daily. Take 1 extra tab when you take metolazone 04/02/15   Jolaine Artist, MD  rosuvastatin (CRESTOR) 10 MG tablet Take 5 mg by mouth daily. Reported on 03/22/2015    Historical Provider, MD  spironolactone (ALDACTONE) 25 MG tablet Take 0.5 tablets (12.5 mg total) by mouth daily. 10/27/12   Evans Lance, MD  thiamine (VITAMIN B-1) 100 MG tablet Take 1 tablet (100 mg total)  by mouth 2 (two) times daily. 12/16/12   Amy D Ninfa Meeker, NP  traZODone (DESYREL) 100 MG tablet Take 400 mg by mouth at bedtime.  06/29/14   Historical Provider, MD  warfarin (COUMADIN) 2 MG tablet Take 1-1.5 tablets (2-3 mg total) by mouth daily at 6 PM. Take 2 mg (1 tablet) daily except on Sunday, Tuesday, and Friday. Take 3 mg (1.5 tablets) on Sunday, Tuesday, and Friday 11/24/14   Domenic Polite, MD   BP 148/93 mmHg  Pulse 98  Temp(Src) 102.3 F (39.1 C) (Rectal)  Resp 36  SpO2 78% Physical Exam  Constitutional: He is oriented to person, place, and time. He appears well-developed and well-nourished.  rigors  HENT:  Head: Normocephalic and atraumatic.  Eyes: Conjunctivae and EOM are normal. Pupils are equal, round, and reactive to light. Right eye exhibits no discharge. Left eye exhibits no discharge. No scleral icterus.  Neck: Normal range of motion. Neck supple. No JVD present.  Cardiovascular: Normal rate, regular rhythm and normal heart sounds.  Exam reveals no gallop and no friction rub.   No murmur heard. Pulmonary/Chest: Effort normal and breath sounds normal. No respiratory distress. He has no wheezes. He has no rales. He exhibits no tenderness.  tachypneic  Abdominal: Soft. He exhibits no distension and no mass. There is no tenderness. There is no rebound and no guarding.  Mild suprapubic tenderness, no other focal abdominal tenderness  Musculoskeletal: Normal range of motion. He exhibits no edema or tenderness.  Neurological: He is alert and oriented to person, place, and time.  Skin: Skin is warm and dry.  Psychiatric: He has a normal mood and affect. His behavior is normal. Judgment and thought content normal.  Nursing note and vitals reviewed.   ED Course  Procedures (including critical care time) Labs Review Labs Reviewed  COMPREHENSIVE METABOLIC PANEL - Abnormal; Notable for the following:    CO2 20 (*)    Glucose, Bld 235 (*)    BUN 45 (*)    Calcium 8.5 (*)     Total Protein 6.0 (*)    Albumin 2.8 (*)    AST 95 (*)    ALT 119 (*)    Total Bilirubin 1.4 (*)    All other components within normal limits  CBC WITH DIFFERENTIAL/PLATELET - Abnormal; Notable for the following:    WBC 11.7 (*)    Platelets 121 (*)    Neutro Abs 9.9 (*)    All other components within normal limits  URINALYSIS, ROUTINE W REFLEX MICROSCOPIC (NOT AT [facility]) - Abnormal; Notable for the following:    APPearance CLOUDY (*)    Leukocytes, UA SMALL (*)    All other components within normal limits  PROTIME-INR - Abnormal; Notable for the following:    Prothrombin Time 30.7 (*)    INR 3.01 (*)    All other components within normal limits  URINE MICROSCOPIC-ADD ON - Abnormal; Notable for the following:    Bacteria, UA FEW (*)    Casts HYALINE CASTS (*)    All other components within normal limits  I-STAT CG4 LACTIC ACID, ED - Abnormal; Notable for the following:    Lactic Acid, Venous 2.27 (*)    All other components within normal limits  CULTURE, BLOOD (ROUTINE X 2)  CULTURE, BLOOD (ROUTINE X 2)  URINE CULTURE  INFLUENZA PANEL BY PCR (TYPE A & B, H1N1)  I-STAT CG4 LACTIC ACID, ED    Imaging Review Dg Chest Port 1 View  04/04/2015  CLINICAL DATA:  Fever, hypoxia EXAM: PORTABLE CHEST 1 VIEW COMPARISON:  11/22/2014 FINDINGS: Cardiomediastinal silhouette is stable. Three leads cardiac pacemaker is unchanged in position. There is infiltrate/ pneumonia right lower lobe. Follow-up to resolution after appropriate treatment is recommended. No pulmonary edema. Atherosclerotic calcifications of thoracic aorta again noted. IMPRESSION: Infiltrate/ pneumonia right lower lobe. Follow-up to resolution after appropriate treatment is recommended. Electronically Signed   By: Lahoma Crocker M.D.   On: 04/04/2015 14:00   I have personally reviewed and evaluated these images and lab results as part of my medical decision-making.   EKG Interpretation None      MDM   Final diagnoses:   Community acquired pneumonia   Patient with history of fevers, diarrhea, cough, and urinary frequency. Meets criteria for sepsis. Sepsis called by RN on arrival. Vital signs reviewed. He is febrile to 102.3 rectally, pulse oxygenation is 98% on room air when evaluated by me. However was reported to be 78% prior to arrival. Patient not tachycardic. He is alert and  oriented. He has had his baseline mental status, but has a history of dementia. Patient discussed with Dr. Jeneen Rinks. Will give fluids, start antibiotics. Consider influenza. Will check labs. Patient will require admission.  Chest x-ray remarkable for pneumonia. Patient is receiving broad-spectrum antibiotic therapy per sepsis order set. Patient discussed with Dr. Jeneen Rinks, who agrees with plan for admission.  Appreciate Dr. Marily Memos for admitting the patient.     Derringer Newcome, PA-C 04/04/15 1555  Tanna Furry, MD 04/07/15 1024

## 2015-04-04 NOTE — Progress Notes (Signed)
ANTICOAGULATION & ANTIBIOTIC CONSULT NOTE - Initial Consult  Pharmacy Consult:  Coumadin + Azithromycin/Rocephin Indication: atrial fibrillation + CAP  No Known Allergies  Patient Measurements: Weight: 162 lb 12.8 oz (73.846 kg)  Vital Signs: Temp: 102.3 F (39.1 C) (03/29 1309) Temp Source: Rectal (03/29 1309) BP: 104/56 mmHg (03/29 1615) Pulse Rate: 80 (03/29 1615)  Labs:  Recent Labs  04/04/15 1339  HGB 13.2  HCT 40.9  PLT 121*  LABPROT 30.7*  INR 3.01*  CREATININE 1.07    Estimated Creatinine Clearance: 58.8 mL/min (by C-G formula based on Cr of 1.07).   Medical History: Past Medical History  Diagnosis Date  . Left bundle-branch block   . Cardiomyopathy   . Chronic systolic heart failure ([place])   . Atrial fibrillation ([place])   . Dementia   . Pneumonia 08/2014       Assessment: 34 YOM admitted with sepsis and started on vancomycin and Zosyn in the ED.  Now to transition to azithromycin and ceftriaxone for CAP, and first doses have been ordered.  Patient's labs reviewed.  Vanc 3/29 >> 3/29 Zosyn 3/29 >> 3/29 CTX 3/29 >> Azith 3/29 >>  3/29 UCx -  3/29 BCx x2 -   Pharmacy also consulted to manage Coumadin for history of Afib.  INR is borderline high on admit at 3.01 and it may trend up further with the initiation of azithromycin.  No bleeding reported.  Home Coumadin dose:  2mg  daily except 3mg  on Sun, Tues, Fri (last dose 3/28)   Goal of Therapy:  INR 2-3 Resolution of infection    Plan:  - Coumadin 0.5mg  PO today - Daily PT / INR - Azithromycin 500mg  IV Q24H and Rocephin 1gm IV Q24H.  Pharmacy will sign off of antibiotics dosing and follow peripherally.  Thank you for the consult!    Nazly Digilio D. Mina Marble, PharmD, BCPS Pager:  [phone] 04/04/2015, 5:51 PM

## 2015-04-05 ENCOUNTER — Inpatient Hospital Stay (HOSPITAL_COMMUNITY): Payer: Medicare Other

## 2015-04-05 DIAGNOSIS — I480 Paroxysmal atrial fibrillation: Secondary | ICD-10-CM

## 2015-04-05 DIAGNOSIS — Z95810 Presence of automatic (implantable) cardiac defibrillator: Secondary | ICD-10-CM

## 2015-04-05 DIAGNOSIS — I5022 Chronic systolic (congestive) heart failure: Secondary | ICD-10-CM

## 2015-04-05 DIAGNOSIS — I4891 Unspecified atrial fibrillation: Secondary | ICD-10-CM

## 2015-04-05 DIAGNOSIS — F0390 Unspecified dementia without behavioral disturbance: Secondary | ICD-10-CM

## 2015-04-05 DIAGNOSIS — J9601 Acute respiratory failure with hypoxia: Secondary | ICD-10-CM

## 2015-04-05 DIAGNOSIS — I369 Nonrheumatic tricuspid valve disorder, unspecified: Secondary | ICD-10-CM

## 2015-04-05 LAB — HIV ANTIBODY (ROUTINE TESTING W REFLEX): HIV SCREEN 4TH GENERATION: NONREACTIVE

## 2015-04-05 LAB — COMPREHENSIVE METABOLIC PANEL
ALBUMIN: 2.1 g/dL — AB (ref 3.5–5.0)
ALT: 83 U/L — ABNORMAL HIGH (ref 17–63)
ANION GAP: 6 (ref 5–15)
AST: 54 U/L — ABNORMAL HIGH (ref 15–41)
Alkaline Phosphatase: 95 U/L (ref 38–126)
BUN: 32 mg/dL — ABNORMAL HIGH (ref 6–20)
CHLORIDE: 112 mmol/L — AB (ref 101–111)
CO2: 22 mmol/L (ref 22–32)
Calcium: 7.9 mg/dL — ABNORMAL LOW (ref 8.9–10.3)
Creatinine, Ser: 0.78 mg/dL (ref 0.61–1.24)
GFR calc Af Amer: >60 mL/min (ref >60)
GFR calc non Af Amer: >60 mL/min (ref >60)
GLUCOSE: 138 mg/dL — AB (ref 65–99)
Potassium: 4.4 mmol/L (ref 3.5–5.1)
Sodium: 140 mmol/L (ref 135–145)
TOTAL PROTEIN: 4.6 g/dL — AB (ref 6.5–8.1)
Total Bilirubin: 0.7 mg/dL (ref 0.3–1.2)

## 2015-04-05 LAB — TROPONIN I: Troponin I: 0.29 ng/mL — ABNORMAL HIGH (ref <0.031)

## 2015-04-05 LAB — PROTIME-INR
INR: 3.64 — AB (ref 0.00–1.49)
PROTHROMBIN TIME: 35.4 s — AB (ref 11.6–15.2)

## 2015-04-05 LAB — ECHOCARDIOGRAM COMPLETE
Height: 69 in
WEIGHTICAEL: 2864.22 [oz_av]

## 2015-04-05 NOTE — Progress Notes (Signed)
Initial Nutrition Assessment  DOCUMENTATION CODES:   Not applicable  INTERVENTION:  Ensure Enlive po BID, each supplement provides 350 kcal and 20 grams of protein  NUTRITION DIAGNOSIS:   Inadequate oral intake related to lethargy/confusion as evidenced by per patient/family report.   GOAL:   Patient will meet greater than or equal to 90% of their needs  MONITOR:   PO intake, I & O's, Labs, Skin, Weight trends  REASON FOR ASSESSMENT:   Malnutrition Screening Tool    ASSESSMENT:   Patient presenting with baseline dementia and unable to provide reliable history. History provided on a limited basis by the patient, but a greater detail by EDP and patient's wife. Patient has had a 2 to three-day history of progressive worsening cough that is productive with large amounts of phlegm and fluid per wife, chills, fever up to 102. Patient's wife's states that Tylenol was given with some relief. Somewhat diminished appetite  Pt endorses good appetite. PO intake in chart 50% thus far. Unable to remember what he had for breakfast. He did state he is receiving ensure, likes them.  Unsure of weight loss. Per chart he exhibits a 23#/11% insignificant wt loss over 9 months.  Pt claims PO intake to be fine, but consistent gradual weight loss says otherwise.  Nutrition-Focused physical exam completed. Findings are no fat depletion, no muscle depletion, and no edema.   Will continue ensure. No family at bedside to provide more Hx. Pt was mildly confused during visit. He continues to suffer from Sepsis of unknown origin. Chronic Systolic HFF w/ EF 0000000 s/p Medtronic CRT-D 09/2012  Labs and Medications reviewed.   Diet Order:  Diet Heart Room service appropriate?: Yes; Fluid consistency:: Thin  Skin:  Reviewed, no issues  Last BM:  PTA  Height:   Ht Readings from Last 1 Encounters:  04/04/15 5\' 9"  (1.753 m)    Weight:   Wt Readings from Last 1 Encounters:  04/05/15 179 lb 0.2  oz (81.2 kg)    Ideal Body Weight:  72.72 kg  BMI:  Body mass index is 26.42 kg/(m^2).  Estimated Nutritional Needs:   Kcal:  1600-2000 calories  Protein:  80-90 grams  Fluid:  >/= 1.6L  EDUCATION NEEDS:   No education needs identified at this time  Jon Butler. Enda Santo, MS, RD LDN After Hours/Weekend Pager [phone]

## 2015-04-05 NOTE — Progress Notes (Signed)
ANTICOAGULATION CONSULT NOTE - Follow Up Consult  Pharmacy Consult for coumadin Indication: atrial fibrillation  No Known Allergies  Patient Measurements: Height: 5\' 9"  (175.3 cm) Weight: 179 lb 0.2 oz (81.2 kg) IBW/kg (Calculated) : 70.7  Vital Signs: Temp: 98.8 F (37.1 C) (03/30 0724) Temp Source: Oral (03/30 0724) BP: 123/80 mmHg (03/30 0724)  Labs:  Recent Labs  04/04/15 1339 04/04/15 1820 04/05/15 0234  HGB 13.2  --  10.3*  HCT 40.9  --  31.1*  PLT 121*  --  111*  LABPROT 30.7*  --  35.4*  INR 3.01*  --  3.64*  CREATININE 1.07  --  0.78  TROPONINI  --  0.25* 0.29*    Estimated Creatinine Clearance: 77.3 mL/min (by C-G formula based on Cr of 0.78).   Medications:  Scheduled:  . allopurinol  300 mg Oral Daily  . amiodarone  200 mg Oral Daily  . aspirin EC  81 mg Oral Daily  . azithromycin  500 mg Intravenous Q24H  . cefTRIAXone (ROCEPHIN)  IV  1 g Intravenous Q24H  . donepezil  23 mg Oral QHS  . feeding supplement (ENSURE ENLIVE)  237 mL Oral BID BM  . memantine  28 mg Oral Daily  . rosuvastatin  5 mg Oral Daily  . sodium chloride flush  3 mL Intravenous Q12H  . traZODone  400 mg Oral QHS  . Warfarin - Pharmacist Dosing Inpatient   Does not apply q1800    Assessment: 80 yo male here with CAP on antibiotics also noted with history of HF (EF= 25% in 2009). He is on coumadin PTA for mechanical aortic valve and afib. Pharmacy consulted to dose coumadin. -INR= 3.64 and trend up  PTA dose 2 mg daily except 3 mg on Sunday Tuesday and Friday per clinic notes    Goal of Therapy:  INR 2-3 Monitor platelets by anticoagulation protocol: Yes   Plan:  -Hold coumadin today -Daily PT/INR  Hildred Laser, Pharm D 04/05/2015 10:53 AM

## 2015-04-05 NOTE — Evaluation (Signed)
Physical Therapy Evaluation Patient Details Name: Jon Butler MRN: [id number] DOB: 19-Oct-1935 Today's Date: 04/05/2015   History of Present Illness  Jon Butler is a 80 yo male with a history of chronic systolic HF EF 123456 due to NICM s/p CRT-D (Medtronic), mechanical AVR (St Jude), LBBB, PAF S/P DC-CV 12/20/12, ETOH abuse and dementia and non-compliance. Patient admitted with UTI and likely PNA positive for sepsis.  Clinical Impression  Patient presents with decreased independence with mobility due to deficits listed in PT problem list.  He will benefit from skilled PT in the acute setting to allow return home with family support and HHPT.    Follow Up Recommendations Home health PT;Supervision/Assistance - 24 hour    Equipment Recommendations  None recommended by PT    Recommendations for Other Services       Precautions / Restrictions Precautions Precautions: Fall      Mobility  Bed Mobility Overal bed mobility: Needs Assistance Bed Mobility: Supine to Sit;Sit to Supine     Supine to sit: Min assist Sit to supine: Supervision   General bed mobility comments: assisted to lift trunk (pt pulled up on my hand), to supine slow, but able to bring legs in bed safely  Transfers Overall transfer level: Needs assistance Equipment used: Rolling walker (2 wheeled) Transfers: Sit to/from Stand Sit to Stand: Min guard         General transfer comment: up from bed surface with cues for hand placement/safetu  Ambulation/Gait Ambulation/Gait assistance: Min guard Ambulation Distance (Feet): 150 Feet Assistive device: Rolling walker (2 wheeled) Gait Pattern/deviations: Step-through pattern;Trunk flexed;Decreased stride length     General Gait Details: very quick slightly impulsive with cues to slow down due to difficulty keeping up with pt with IV pole  Stairs            Wheelchair Mobility    Modified Rankin (Stroke Patients Only)       Balance Overall  balance assessment: Needs assistance Sitting-balance support: Feet supported Sitting balance-Leahy Scale: Good     Standing balance support: Bilateral upper extremity supported Standing balance-Leahy Scale: Fair Standing balance comment: UE support helpful for balance esp with ambulation                             Pertinent Vitals/Pain Pain Assessment: No/denies pain    Home Living Family/patient expects to be discharged to:: Private residence Living Arrangements: Spouse/significant other Available Help at Discharge: Family;Available 24 hours/day Type of Home: House Home Access: Stairs to enter Entrance Stairs-Rails: Right Entrance Stairs-Number of Steps: 2 Home Layout: One level Home Equipment: Walker - 2 wheels;Bedside commode;Shower seat Additional Comments: Patient unreliable historian and no family available; info gleaned from previous admission    Prior Function Level of Independence: Needs assistance               Hand Dominance   Dominant Hand: Right    Extremity/Trunk Assessment   Upper Extremity Assessment: Overall WFL for tasks assessed           Lower Extremity Assessment: Overall WFL for tasks assessed      Cervical / Trunk Assessment: Kyphotic;Other exceptions  Communication   Communication: No difficulties  Cognition Arousal/Alertness: Awake/alert Behavior During Therapy: Impulsive Overall Cognitive Status: No family/caregiver present to determine baseline cognitive functioning                      General Comments General comments (  skin integrity, edema, etc.): Patient with bruise on forehead and around both knees and lower legs, does relate had a fall at some point onto his face when transitioning from carpet to wood/laminate.    Exercises        Assessment/Plan    PT Assessment Patient needs continued PT services  PT Diagnosis Abnormality of gait   PT Problem List Decreased activity tolerance;Decreased  balance;Decreased mobility;Decreased cognition;Decreased knowledge of use of DME;Decreased safety awareness;Decreased knowledge of precautions  PT Treatment Interventions DME instruction;Balance training;Gait training;Functional mobility training;Patient/family education;Therapeutic activities;Therapeutic exercise;Stair training   PT Goals (Current goals can be found in the Care Plan section) Acute Rehab PT Goals Patient Stated Goal: None stated PT Goal Formulation: Patient unable to participate in goal setting Time For Goal Achievement: 04/12/15 Potential to Achieve Goals: Fair    Frequency Min 3X/week   Barriers to discharge        Co-evaluation               End of Session Equipment Utilized During Treatment: Gait belt Activity Tolerance: Patient tolerated treatment well Patient left: in bed;with call bell/phone within reach;with bed alarm set Nurse Communication: Mobility status         Time: [id number] PT Time Calculation (min) (ACUTE ONLY): 27 min   Charges:   PT Evaluation $PT Eval Moderate Complexity: 1 Procedure PT Treatments $Gait Training: 8-22 mins   PT G Codes:        Reginia Naas 04/13/15, 1:44 PM

## 2015-04-05 NOTE — Care Management Note (Signed)
Case Management Note  Patient Details  Name: Jon Butler MRN: [id number] Date of Birth: [birth year]  Subjective/Objective:       Adm w uti             Action/Plan: lives w wife, pcp dr Deforest Hoyles  Expected Discharge Date:                  Expected Discharge Plan:  Home/Self Care  In-House Referral:     Discharge planning Services     Post Acute Care Choice:    Choice offered to:     DME Arranged:    DME Agency:     HH Arranged:    [place] Agency:     Status of Service:     Medicare Important Message Given:    Date Medicare IM Given:    Medicare IM give by:    Date Additional Medicare IM Given:    Additional Medicare Important Message give by:     If discussed at [place] of Stay Meetings, dates discussed:    Additional Comments: ur review done  Lacretia Leigh, RN 04/05/2015, 7:37 AM

## 2015-04-05 NOTE — Consult Note (Signed)
Advanced Heart Failure Team Consult Note  Referring Physician: Dr. Charlies Silvers PCP: Dr Lysle Rubens EP: Dr Lovena [facility] HF: [place]  Reason for Consultation: CHF/ Hypotension, ? shock  HPI:    Jon Butler is a 80 yo male with a history of chronic systolic HF EF [id number] due to NICM s/p CRT-D (Medtronic), mechanical AVR (St Jude), LBBB, PAF S/P DC-CV 12/20/12, ETOH abuse and dementia and non-compliance.   He was last seen in CHF clinic 03/22/15. Thought to be stable. Volume status stable by Optivol and exam.  Iisinopril increased. Weight at that visit 175 lbs.   He states he has been feeling ill for the past 2-3  days.  Breathing has been stable.  Denies DOE, lightheadedness, or dizziness. No CP. Has been watching fluid and salt at home. Had fever up to 102 at home with associated chills and productive cough with yellow sputum. Denies diarrhea.   He presented to [facility]04/15 with fever and malaise x 2 days. No relief from OTC antipyretics. Has baseline dementia. He also complains of urinary frequency and suprapubic pain. Pertinent admission labs include Creatinine 1.07, K 5.0, WBC 11.7, UA with few bacteria, small leukocytes, and hyaline casts. Of note, patient is on chronic amoxicillin. CXR with RLL PNA. Admitted for sepsis. Lasix and spironolactone due to hypotension. NEGATIVE for flu.   This am. Feeling much better. Afebrile. Off O2. SBP 120.    Review of Systems: [y] = yes, [ ]  = no   General: Weight gain [ ] ; Weight loss [ ] ; Anorexia [ ] ; Fatigue [y]; Fever [y]; Chills [y]; Weakness [y]  Cardiac: Chest pain/pressure [ ] ; Resting SOB [ ] ; Exertional SOB [ ] ; Orthopnea [ ] ; Pedal Edema [ ] ; Palpitations [ ] ; Syncope [ ] ; Presyncope [ ] ; Paroxysmal nocturnal dyspnea[ ]   Pulmonary: Cough [y]; Wheezing[ ] ; Hemoptysis[ ] ; Sputum [y]; Snoring [ ]   GI: Vomiting[ ] ; Dysphagia[ ] ; Melena[ ] ; Hematochezia [ ] ; Heartburn[ ] ; Abdominal pain [ ] ; Constipation [ ] ; Diarrhea [ ] ; BRBPR [ ]   GU:  Hematuria[ ] ; Dysuria [ ] ; Nocturia[ ]   Vascular: Pain in legs with walking [ ] ; Pain in feet with lying flat [ ] ; Non-healing sores [ ] ; Stroke [ ] ; TIA [ ] ; Slurred speech [ ] ;  Neuro: Headaches[ ] ; Vertigo[ ] ; Seizures[ ] ; Paresthesias[ ] ;Blurred vision [ ] ; Diplopia [ ] ; Vision changes [ ]   Ortho/Skin: Arthritis [y]; Joint pain [y]; Muscle pain [ ] ; Joint swelling [ ] ; Back Pain [ ] ; Rash [ ]   Psych: Depression[ ] ; Anxiety[ ]   Heme: Bleeding problems [ ] ; Clotting disorders [ ] ; Anemia [ ]   Endocrine: Diabetes [ ] ; Thyroid dysfunction[ ]   Home Medications Prior to Admission medications   Medication Sig Start Date End Date Taking? Authorizing Provider  allopurinol (ZYLOPRIM) 300 MG tablet Take 300 mg by mouth daily.    Yes Historical Provider, MD  amiodarone (PACERONE) 200 MG tablet Take 1 tablet (200 mg total) by mouth daily. 09/07/14  Yes Jolaine Artist, MD  amoxicillin (AMOXIL) 500 MG capsule Take 1 capsule (500 mg total) by mouth every other day. Restart after Antibiotic for UTI completed 11/24/14  Yes Domenic Polite, MD  aspirin EC 81 MG tablet Take 1 tablet (81 mg total) by mouth daily. 10/26/12  Yes Brooke O Edmisten, PA-C  carvedilol (COREG) 3.125 MG tablet Take 3.125 mg by mouth 2 (two) times daily with a meal.   Yes Historical Provider, MD  colchicine 0.6 MG tablet Take  0.6 mg by mouth daily as needed (gout pain). Reported on 12/21/2014   Yes Historical Provider, MD  donepezil (ARICEPT) 23 MG TABS tablet Take 1 tablet (23 mg total) by mouth at bedtime. 11/07/14  Yes Penni Bombard, MD  fluticasone (FLONASE) 50 MCG/ACT nasal spray Place 2 sprays into both nostrils daily as needed for allergies or rhinitis. Reported on 12/21/2014 08/17/13  Yes Historical Provider, MD  furosemide (LASIX) 40 MG tablet Take 40 mg by mouth. Take 1 tab (40 mg) one day alternating with 1.5 tabs (60mg ) the next day continuously   Yes Historical Provider, MD  lisinopril (PRINIVIL,ZESTRIL) 10 MG tablet Take  0.5 tablets (5 mg total) by mouth 2 (two) times daily. 03/22/15  Yes Jolaine Artist, MD  LORazepam (ATIVAN) 0.5 MG tablet Take 1 tablet (0.5 mg total) by mouth every 8 (eight) hours as needed for anxiety. 08/21/14  Yes Eugenie Filler, MD  memantine (NAMENDA XR) 28 MG CP24 24 hr capsule Take 1 capsule (28 mg total) by mouth daily. 04/20/14  Yes Penni Bombard, MD  metolazone (ZAROXOLYN) 2.5 MG tablet Take 1 tablet (2.5 mg total) by mouth daily. As needed for weight gain 02/20/15  Yes Jolaine Artist, MD  potassium chloride SA (K-DUR,KLOR-CON) 20 MEQ tablet Take 1 tablet (20 mEq total) by mouth daily. Take 1 extra tab when you take metolazone 04/02/15  Yes Jolaine Artist, MD  rosuvastatin (CRESTOR) 10 MG tablet Take 5 mg by mouth daily. Reported on 03/22/2015   Yes Historical Provider, MD  spironolactone (ALDACTONE) 25 MG tablet Take 0.5 tablets (12.5 mg total) by mouth daily. 10/27/12  Yes Evans Lance, MD  thiamine (VITAMIN B-1) 100 MG tablet Take 1 tablet (100 mg total) by mouth 2 (two) times daily. 12/16/12  Yes Amy D Clegg, NP  traZODone (DESYREL) 100 MG tablet Take 400 mg by mouth at bedtime.  06/29/14  Yes Historical Provider, MD  warfarin (COUMADIN) 2 MG tablet Take 1-1.5 tablets (2-3 mg total) by mouth daily at 6 PM. Take 2 mg (1 tablet) daily except on Sunday, Tuesday, and Friday. Take 3 mg (1.5 tablets) on Sunday, Tuesday, and Friday 11/24/14  Yes Domenic Polite, MD    Past Medical History: Past Medical History  Diagnosis Date  . Left bundle-branch block   . Cardiomyopathy   . Chronic systolic heart failure ([place])   . Atrial fibrillation ([place])   . Dementia   . Pneumonia 08/2014    Past Surgical History: Past Surgical History  Procedure Laterality Date  . Doppler echocardiography  2008, 2009  . Aortic valve replacement    . Tee without cardioversion N/A 10/15/2012    Procedure: TRANSESOPHAGEAL ECHOCARDIOGRAM (TEE);  Surgeon: Fay Records, MD;  Location: [facility] ENDOSCOPY;   Service: Cardiovascular;  Laterality: N/A;  . Cardioversion N/A 10/25/2012    Procedure: CARDIOVERSION;  Surgeon: Evans Lance, MD;  Location: [place];  Service: Cardiovascular;  Laterality: N/A;  . Cardioversion N/A 12/20/2012    Procedure: CARDIOVERSION;  Surgeon: Larey Dresser, MD;  Location: [place];  Service: Cardiovascular;  Laterality: N/A;  . Implantable cardioverter defibrillator generator change N/A 09/20/2012    Procedure: IMPLANTABLE CARDIOVERTER DEFIBRILLATOR GENERATOR CHANGE;  Surgeon: Evans Lance, MD;  Location: [facility] CATH LAB;  Service: Cardiovascular;  Laterality: N/A;  . Tee without cardioversion N/A 08/10/2014    Procedure: TRANSESOPHAGEAL ECHOCARDIOGRAM (TEE);  Surgeon: Sanda Klein, MD;  Location: [facility] ENDOSCOPY;  Service: Cardiovascular;  Laterality: N/A;  . Cardioversion  N/A 08/10/2014    Procedure: CARDIOVERSION;  Surgeon: Sanda Klein, MD;  Location: [facility] ENDOSCOPY;  Service: Cardiovascular;  Laterality: N/A;  . Esophagogastroduodenoscopy (egd) with propofol N/A 08/18/2014    Procedure: ESOPHAGOGASTRODUODENOSCOPY (EGD) WITH PROPOFOL;  Surgeon: Wonda Horner, MD;  Location: [facility] ENDOSCOPY;  Service: Endoscopy;  Laterality: N/A;  . Botox injection N/A 08/18/2014    Procedure: BOTOX INJECTION;  Surgeon: Wonda Horner, MD;  Location: [facility] ENDOSCOPY;  Service: Endoscopy;  Laterality: N/A;    Family History: Family History  Problem Relation Age of Onset  . Aneurysm Mother     brain  . Heart attack Father 33    Social History: Social History   Social History  . Marital Status: Married    Spouse Name: [name]  . Number of Children: 5  . Years of Education: 12   Occupational History  . retired    Social History Main Topics  . Smoking status: Never Smoker   . Smokeless tobacco: Never Used  . Alcohol Use: 0.0 oz/week    2-3 Glasses of wine per week     Comment: 2-3 glasses  . Drug Use: No  . Sexual Activity: No   Other Topics Concern  . None   Social History  Narrative   Patient is married [name]).   Patient is retired.   5 children    12th grade   2 cups caffeine daily    Allergies:  No Known Allergies  Objective:    Vital Signs:   Temp:  [98.1 F (36.7 C)-102.3 F (39.1 C)] 98.8 F (37.1 C) (03/30 0724) Pulse Rate:  [78-98] 78 (03/29 1815) Resp:  [12-36] 30 (03/30 0724) BP: (91-148)/(41-93) 123/80 mmHg (03/30 0724) SpO2:  [78 %-100 %] 93 % (03/30 0724) FiO2 (%):  [100 %] 100 % (03/29 2000) Weight:  [162 lb 12.8 oz (73.846 kg)-179 lb 0.2 oz (81.2 kg)] 179 lb 0.2 oz (81.2 kg) (03/30 0440)    Weight change: Filed Weights   04/04/15 1337 04/04/15 1800 04/05/15 0440  Weight: 162 lb 12.8 oz (73.846 kg) 174 lb 2.6 oz (79 kg) 179 lb 0.2 oz (81.2 kg)    Intake/Output:   Intake/Output Summary (Last 24 hours) at 04/05/15 1035 Last data filed at 04/05/15 0800  Gross per 24 hour  Intake   3320 ml  Output      0 ml  Net   3320 ml     Physical Exam: General:  NAD. Respirations ok HEENT: normal Neck: supple. JVP 8-9. Carotids 2+ bilat; no bruits. No lymphadenopathy or thyromegaly appreciated. Cor: PMI nondisplaced. Regular rate & rhythm. No rubs, gallops or murmurs. Lungs: Scattered rhonchi, diminished RLL Abdomen: soft, NT/ND Extremities: no cyanosis, clubbing, rash. Trace ankle edema. Neuro: alert & orientedx3, cranial nerves grossly intact. moves all 4 extremities w/o difficulty. Affect pleasant  Telemetry: Reviewed, V pacing, appears to have P waves, Looks like NSR.  Labs: Basic Metabolic Panel:  Recent Labs Lab 04/04/15 1339 04/04/15 1750 04/05/15 0234  NA 140  --  140  K 5.0  --  4.4  CL 107  --  112*  CO2 20*  --  22  GLUCOSE 235*  --  138*  BUN 45*  --  32*  CREATININE 1.07  --  0.78  CALCIUM 8.5*  --  7.9*  MG  --  1.9  --   PHOS  --  4.1  --     Liver Function Tests:  Recent Labs Lab 04/04/15 1339 04/05/15  0234  AST 95* 54*  ALT 119* 83*  ALKPHOS 123 95  BILITOT 1.4* 0.7  PROT 6.0* 4.6*   ALBUMIN 2.8* 2.1*   No results for input(s): LIPASE, AMYLASE in the last 168 hours. No results for input(s): AMMONIA in the last 168 hours.  CBC:  Recent Labs Lab 04/04/15 1339 04/05/15 0234  WBC 11.7* 11.2*  NEUTROABS 9.9*  --   HGB 13.2 10.3*  HCT 40.9 31.1*  MCV 94.7 93.7  PLT 121* 111*    Cardiac Enzymes:  Recent Labs Lab 04/04/15 1820 04/05/15 0234  TROPONINI 0.25* 0.29*    BNP: BNP (last 3 results)  Recent Labs  08/09/14 0745 12/21/14 1611  BNP 2479.2* 1707.6*    ProBNP (last 3 results) No results for input(s): PROBNP in the last 8760 hours.   CBG: No results for input(s): GLUCAP in the last 168 hours.  Coagulation Studies:  Recent Labs  04/04/15 1339 04/05/15 0234  LABPROT 30.7* 35.4*  INR 3.01* 3.64*    Other results: EKG: 04/04/15 Afib 93  Imaging: Dg Chest Port 1 View  04/05/2015  CLINICAL DATA:  Sepsis.  Shortness of breath. EXAM: PORTABLE CHEST 1 VIEW COMPARISON:  04/04/2015. FINDINGS: Mediastinum hilar structures stable. Prior median sternotomy. AICD in stable position. Stable cardiomegaly. Persistent right lower lobe infiltrate. No interim change. No pleural effusion or pneumothorax. IMPRESSION: 1. Persistent right lower lobe infiltrate without significant interim change. 2. Prior median sternotomy. AICD in stable position. Stable cardiomegaly. Electronically Signed   By: Marcello Moores  Register   On: 04/05/2015 07:15   Dg Chest Port 1 View  04/04/2015  CLINICAL DATA:  Fever, hypoxia EXAM: PORTABLE CHEST 1 VIEW COMPARISON:  11/22/2014 FINDINGS: Cardiomediastinal silhouette is stable. Three leads cardiac pacemaker is unchanged in position. There is infiltrate/ pneumonia right lower lobe. Follow-up to resolution after appropriate treatment is recommended. No pulmonary edema. Atherosclerotic calcifications of thoracic aorta again noted. IMPRESSION: Infiltrate/ pneumonia right lower lobe. Follow-up to resolution after appropriate treatment is  recommended. Electronically Signed   By: Lahoma Crocker M.D.   On: 04/04/2015 14:00      Medications:     Current Medications: . allopurinol  300 mg Oral Daily  . amiodarone  200 mg Oral Daily  . aspirin EC  81 mg Oral Daily  . azithromycin  500 mg Intravenous Q24H  . cefTRIAXone (ROCEPHIN)  IV  1 g Intravenous Q24H  . donepezil  23 mg Oral QHS  . feeding supplement (ENSURE ENLIVE)  237 mL Oral BID BM  . memantine  28 mg Oral Daily  . rosuvastatin  5 mg Oral Daily  . sodium chloride flush  3 mL Intravenous Q12H  . traZODone  400 mg Oral QHS  . Warfarin - Pharmacist Dosing Inpatient   Does not apply q1800     Infusions: . sodium chloride 100 mL/hr at 04/04/15 1825      Assessment   1. Sepsis, ?PNA.  1. Chronic Systolic Heart Failure TEE with EF 15-20% S/P Medtronic CRT-D 09/2012  2. A Fib- S/P DC-CV 12/20/12 but reverted to AF. Had AFL in 11/1 and paced out.  3. CKD stage 3 4. Alcohol abuse 5. Baseline dementia.  6. Mechanical AVR - on coumadin. SBE prophylaxis for all procedures.   Plan   PNA management per primary  Repeat Echo pending.    He appears to be in NSR by telemetry, though his EKG yesterday showed Afib.  Will follow closely. Will have device interrogated.   Volume status  stable but trending up.  Would be careful with IVF. Will likely need to restart his lasix soon.  Will restart his HF meds has his BP tolerates, likely starting in am.   We will follow along for resumption of cardiac meds and fluid management.   Length of Stay: 1  Shirley Friar PA-C 04/05/2015, 10:35 AM  Advanced Heart Failure Team Pager [phone] (M-F; 7a - 4p)  Please contact [facility] for night-coverage after hours (4p -7a ) and weekends on [email or web address]  Patient seen and examined with Oda Kilts, PA-C. We discussed all aspects of the encounter. I agree with the assessment and plan as stated above.   He is much improved this am. Volume status looks good.  Respiratory status stable. Would restart lasix tomorrow. ICD interrogated personally. No VT. Now in NSR.   We will follow.   Bensimhon, Daniel,MD 2:58 PM

## 2015-04-05 NOTE — Progress Notes (Signed)
  Echocardiogram 2D Echocardiogram has been performed.  Jon Butler 04/05/2015, 9:05 AM

## 2015-04-05 NOTE — Progress Notes (Signed)
Patient ID: Jon Butler, male   DOB: 07-22-1935, 80 y.o.   MRN: [id number] [facility] PROGRESS NOTE  Jon Butler [id number]:[id number] DOB: August 24, 1935 DOA: 04/04/2015 PCP: [name], MD  Brief narrative:    80 y.o. Male with past medical history of dementia, atrial fibrillation, AICD who presented to [facility] ED with worsening cough productive of white to yellow sputum, fevers of up to 102 F, tylenol provided some relief but he also took amoxicillin which did not result in significant symptomatic relief.  On admission, he was hemodynamically stable. His O2 sat was 78% on room air but it has improved to 9-96% with [place] oxygen support. Blood work showed WBC count 11.7, platelets 121, normal Cr. CXR showed persistent right lower lobe infiltrate and he was started on azithromycin and rocephin. Due to slight elevation in troponin level he was admitted to cardiac SDU and cardio has seen him in consultation.   Assessment/Plan:    Principal Problem: Acute respiratory failure with hypoxia / Right lower lobe pneumonia  - Likely due to pneumonia - Resp status stable - May se nebs as needed if there is shortness of breath or wheezing  Active Problems:   Sepsis due to pneumonia / Leukocytosis  - Sepsis criteria met on admission with hypoxia, fever of 102.3 F, tachypnea, lactic acidosis and evidence of pneumonia on CXR - Started on azithromycin and Rocephin - Influenza is negative - Blood cultures are negative so far - HIV is non reactive     Troponin elevation - Likely demand ischemia from hypoxia - Appreciate cardio following - Cycling cardiac enzymes - Continue aspirin daily    Atrial fibrillation (HCC) / Biventricular implantable cardioverter-defibrillator in situ  - CHADS vasc score at least 3 - On AC with coumadin - Rate controlled with amiodarone     Dementia without behavioral disturbance - Continue namenda and aricept    Dyslipidemia - Continue Crestor    DVT Prophylaxis  -  On AC with coumadin    Code Status: DNR/DNI Family Communication:  plan of care discussed with the patient and his son at the bedside  Disposition Plan: home likely by 04/07/2015   IV access:  Peripheral IV  Procedures and diagnostic studies:    Dg Chest Port 1 View 04/28/15 1. Persistent right lower lobe infiltrate without significant interim change. 2. Prior median sternotomy. AICD in stable position. Stable cardiomegaly. Electronically Signed   By: Marcello Moores  Register   On: 2015-04-28 07:15   Dg Chest Port 1 View 04/04/2015  Infiltrate/ pneumonia right lower lobe. Follow-up to resolution after appropriate treatment is recommended. Electronically Signed   By: Lahoma Crocker M.D.   On: 04/04/2015 14:00   Medical Consultants:  Cardiology  Other Consultants:  None   IAnti-Infectives:   Azithromycin and rocephin 04/04/2015 -->    Leisa Lenz, MD  [facility] Pager [phone]  Time spent in minutes: 25 minutes  If 7PM-7AM, please contact night-coverage [email or web address] Password [facility] 2015-04-28, 6:37 PM   LOS: 1 day    HPI/Subjective: No acute overnight events. Patient reports he feels better.   Objective: Filed Vitals:   04/28/15 0440 April 28, 2015 0724 Apr 28, 2015 1125 04/28/2015 1547  BP: 133/65 123/80 120/80 133/75  Pulse:      Temp: 98.7 F (37.1 C) 98.8 F (37.1 C) 98.4 F (36.9 C) 97.3 F (36.3 C)  TempSrc: Oral Oral Oral Oral  Resp: _0 Height:      Weight: 81.2 kg (179 lb 0.2 oz)  SpO2: 95% 93% 93% 96%    Intake/Output Summary (Last 24 hours) at 04/05/15 1837 Last data filed at 04/05/15 1100  Gross per 24 hour  Intake   2120 ml  Output      0 ml  Net   2120 ml    Exam:   General:  Pt is alert, follows commands appropriately, not in acute distress  Cardiovascular: Regular rate and rhythm, S1/S2 appreciated   Respiratory: Clear to auscultation bilaterally, no wheezing, no crackles, no rhonchi  Abdomen: Soft, non tender, non distended, bowel  sounds present  Extremities: No edema, pulses palpable bilaterally  Neuro: Grossly nonfocal  Data Reviewed: Basic Metabolic Panel:  Recent Labs Lab 04/04/15 1339 04/04/15 1750 04/05/15 0234  NA 140  --  140  K 5.0  --  4.4  CL 107  --  112*  CO2 20*  --  22  GLUCOSE 235*  --  138*  BUN 45*  --  32*  CREATININE 1.07  --  0.78  CALCIUM 8.5*  --  7.9*  MG  --  1.9  --   PHOS  --  4.1  --    Liver Function Tests:  Recent Labs Lab 04/04/15 1339 04/05/15 0234  AST 95* 54*  ALT 119* 83*  ALKPHOS 123 95  BILITOT 1.4* 0.7  PROT 6.0* 4.6*  ALBUMIN 2.8* 2.1*   No results for input(s): LIPASE, AMYLASE in the last 168 hours. No results for input(s): AMMONIA in the last 168 hours. CBC:  Recent Labs Lab 04/04/15 1339 04/05/15 0234  WBC 11.7* 11.2*  NEUTROABS 9.9*  --   HGB 13.2 10.3*  HCT 40.9 31.1*  MCV 94.7 93.7  PLT 121* 111*   Cardiac Enzymes:  Recent Labs Lab 04/04/15 1820 04/05/15 0234  TROPONINI 0.25* 0.29*   BNP: Invalid input(s): POCBNP CBG: No results for input(s): GLUCAP in the last 168 hours.  Recent Results (from the past 240 hour(s))  Blood Culture (routine x 2)     Status: None (Preliminary result)   Collection Time: 04/04/15  1:20 PM  Result Value Ref Range Status   Specimen Description BLOOD LEFT FOREARM  Final   Special Requests BOTTLES DRAWN AEROBIC ONLY 5CC  Final   Culture NO GROWTH 1 DAY  Final   Report Status PENDING  Incomplete  Blood Culture (routine x 2)     Status: None (Preliminary result)   Collection Time: 04/04/15  1:38 PM  Result Value Ref Range Status   Specimen Description BLOOD RIGHT HAND  Final   Special Requests BOTTLES DRAWN AEROBIC AND ANAEROBIC 5CC  Final   Culture NO GROWTH 1 DAY  Final   Report Status PENDING  Incomplete  Urine culture     Status: None (Preliminary result)   Collection Time: 04/04/15  2:00 PM  Result Value Ref Range Status   Specimen Description URINE, RANDOM  Final   Special Requests NONE   Final   Culture 40,000 COLONIES/ml GRAM NEGATIVE RODS  Final   Report Status PENDING  Incomplete  MRSA PCR Screening     Status: None   Collection Time: 04/04/15  6:03 PM  Result Value Ref Range Status   MRSA by PCR NEGATIVE NEGATIVE Final    Comment:        The GeneXpert MRSA Assay (FDA approved for NASAL specimens only), is one component of a comprehensive MRSA colonization surveillance program. It is not intended to diagnose MRSA infection nor to guide or monitor treatment for  MRSA infections.      Scheduled Meds: . allopurinol  300 mg Oral Daily  . amiodarone  200 mg Oral Daily  . aspirin EC  81 mg Oral Daily  . azithromycin  500 mg Intravenous Q24H  . cefTRIAXone (ROCEPHIN)  IV  1 g Intravenous Q24H  . donepezil  23 mg Oral QHS  . feeding supplement (ENSURE ENLIVE)  237 mL Oral BID BM  . memantine  28 mg Oral Daily  . rosuvastatin  5 mg Oral Daily  . sodium chloride flush  3 mL Intravenous Q12H  . traZODone  400 mg Oral QHS  . Warfarin - Pharmacist Dosing Inpatient   Does not apply q1800   Continuous Infusions: . sodium chloride 100 mL/hr at 04/04/15 1825

## 2015-04-05 NOTE — Progress Notes (Signed)
Occupational Therapy Evaluation Patient Details Name: Jon Butler MRN: [id number] DOB: 1935/03/11 Today's Date: 04/05/2015    History of Present Illness Jon Butler is a 80 yo male with a history of chronic systolic HF EF 123456 due to NICM s/p CRT-D ([employer]), mechanical AVR (St Jude), LBBB, PAF S/P DC-CV 12/20/12, ETOH abuse and dementia and non-compliance. Patient admitted with UTI and likely PNA positive for sepsis.   Clinical Impression   Jon Butler/wife report that PTA, Jon Butler was mod I with mobility and ADL. Wife reports that she feels his cognition is close to baseline. Jon Butler apparently confused. Jon Butler ambulated with min guard assist and is close to baseline with his ADL tasks. Vitals stable throughout session. Recommend 24/7 S and S with all mobility after D/C. OT signing off.     Follow Up Recommendations  No OT follow up;Supervision/Assistance - 24 hour (S with all mobility)    Equipment Recommendations  None recommended by OT    Recommendations for Other Services       Precautions / Restrictions Precautions Precautions: Fall      Mobility Bed Mobility Overal bed mobility: Needs Assistance Bed Mobility: Supine to Sit;Sit to Supine     Supine to sit: Supervision Sit to supine: Supervision     Transfers Overall transfer level: Needs assistance Equipment used: Rolling walker (2 wheeled) Transfers: Sit to/from [facility] Sit to Stand: Supervision Stand pivot transfers: Min guard           Balance Overall balance assessment: Needs assistance Sitting-balance support: Feet supported Sitting balance-Leahy Scale: Poor     Standing balance support: Bilateral upper extremity supported Standing balance-Leahy Scale: Fair Standing balance comment: Would benefit from use of RW                            ADL Overall ADL's : Needs assistance/impaired                                     Functional mobility during ADLs: Min  guard General ADL Comments: overall set up with bathing/dressing. Wife states she can assist as needed. Educated on reducing risk of falls. Recommeded Jon Butler use built in shower seat for bating for energy conservation and to reduce risk of falls. wife verbalized understanding. Jon Butler would benefit from using RW. Jon Butler stated there is nothing wrong with him and he just needs to go home and that he doesn't plan on using a walker.     Vision     Perception     Praxis      Pertinent Vitals/Pain Pain Assessment: No/denies pain     Hand Dominance Right   Extremity/Trunk Assessment Upper Extremity Assessment Upper Extremity Assessment: Overall WFL for tasks assessed   Lower Extremity Assessment Lower Extremity Assessment: Defer to Jon Butler evaluation   Cervical / Trunk Assessment Cervical / Trunk Assessment: Kyphotic Cervical / Trunk Exceptions: keeps head flexed    Communication Communication Communication: No difficulties   Cognition Arousal/Alertness: Awake/alert Behavior During Therapy: Impulsive Overall Cognitive Status: History of cognitive impairments - at baseline (wife reports close to baseline)  Poor awareness, insight and safety/judgement                     General Comments       Exercises       Shoulder Instructions      Home  Living Family/patient expects to be discharged to:: Private residence Living Arrangements: Spouse/significant other Available Help at Discharge: Family;Available 24 hours/day Type of Home: House Home Access: Stairs to enter [employer] of Steps: 2 Entrance Stairs-Rails: Right Home Layout: One level     Bathroom Shower/Tub: Tub/shower unit Shower/tub characteristics: Door Biochemist, clinical: Standard Bathroom Accessibility: Yes How Accessible: Accessible via walker Home Equipment: Walker - 2 wheels;Bedside commode;Shower seat;Shower seat - built in   Additional Comments: Patient unreliable historian and no family available; info  gleaned from previous admission      Prior Functioning/Environment Level of Independence: Independent  Gait / Transfers Assistance Needed: ambulating without AD ADL's / Homemaking Assistance Needed: reports independence        OT Diagnosis: Generalized weakness;Cognitive deficits   OT Problem List: Decreased activity tolerance;Impaired balance (sitting and/or standing);Cardiopulmonary status limiting activity   OT Treatment/Interventions:      OT Goals(Current goals can be found in the care plan section) Acute Rehab OT Goals Patient Stated Goal: to go home OT Goal Formulation: All assessment and education complete, DC therapy  OT Frequency:     Barriers to D/C:            Co-evaluation              End of Session Equipment Utilized During Treatment: Gait belt Nurse Communication: Mobility status  Activity Tolerance: Patient tolerated treatment well Patient left: in bed;with call bell/phone within reach;with family/visitor present   Time: [id number] OT Time Calculation (min): 17 min Charges:  OT General Charges $OT Visit: 1 Procedure OT Evaluation $OT Eval Moderate Complexity: 1 Procedure G-Codes:    Bevin Mayall,HILLARY April 30, 2015, 4:25 PM   [facility], OTR/L  [phone] Apr 30, 2015

## 2015-04-06 ENCOUNTER — Encounter: Payer: Self-pay | Admitting: Cardiology

## 2015-04-06 DIAGNOSIS — A419 Sepsis, unspecified organism: Principal | ICD-10-CM

## 2015-04-06 DIAGNOSIS — I482 Chronic atrial fibrillation: Secondary | ICD-10-CM

## 2015-04-06 LAB — CUP PACEART REMOTE DEVICE CHECK
Brady Statistic AP VP Percent: 73.02 %
Brady Statistic AP VS Percent: 0.51 %
Brady Statistic AS VP Percent: 22.89 %
Brady Statistic AS VS Percent: 3.58 %
Date Time Interrogation Session: 20170228072605
HIGH POWER IMPEDANCE MEASURED VALUE: 45 Ohm
HIGH POWER IMPEDANCE MEASURED VALUE: 56 Ohm
Implantable Lead Implant Date: 20090326
Implantable Lead Location: 753858
Implantable Lead Model: 5076
Implantable Lead Model: 6947
Lead Channel Impedance Value: 342 Ohm
Lead Channel Impedance Value: 361 Ohm
Lead Channel Impedance Value: 475 Ohm
Lead Channel Impedance Value: 646 Ohm
Lead Channel Pacing Threshold Amplitude: 1 V
Lead Channel Pacing Threshold Pulse Width: 0.4 ms
Lead Channel Pacing Threshold Pulse Width: 0.4 ms
Lead Channel Sensing Intrinsic Amplitude: 21.75 mV
Lead Channel Sensing Intrinsic Amplitude: 21.75 mV
Lead Channel Setting Pacing Amplitude: 2 V
Lead Channel Setting Pacing Amplitude: 2 V
Lead Channel Setting Pacing Amplitude: 2.5 V
Lead Channel Setting Pacing Pulse Width: 0.4 ms
MDC IDC LEAD IMPLANT DT: 20090326
MDC IDC LEAD IMPLANT DT: 20090326
MDC IDC LEAD LOCATION: 753859
MDC IDC LEAD LOCATION: 753860
MDC IDC LEAD MODEL: 4194
MDC IDC MSMT BATTERY REMAINING LONGEVITY: 65 mo
MDC IDC MSMT BATTERY VOLTAGE: 2.99 V
MDC IDC MSMT LEADCHNL RA IMPEDANCE VALUE: 418 Ohm
MDC IDC MSMT LEADCHNL RA PACING THRESHOLD AMPLITUDE: 0.75 V
MDC IDC MSMT LEADCHNL RA SENSING INTR AMPL: 0.75 mV
MDC IDC MSMT LEADCHNL RA SENSING INTR AMPL: 0.75 mV
MDC IDC MSMT LEADCHNL RV IMPEDANCE VALUE: 418 Ohm
MDC IDC MSMT LEADCHNL RV PACING THRESHOLD AMPLITUDE: 0.625 V
MDC IDC MSMT LEADCHNL RV PACING THRESHOLD PULSEWIDTH: 0.4 ms
MDC IDC SET LEADCHNL RV PACING PULSEWIDTH: 0.4 ms
MDC IDC SET LEADCHNL RV SENSING SENSITIVITY: 0.6 mV
MDC IDC STAT BRADY RA PERCENT PACED: 73.54 %
MDC IDC STAT BRADY RV PERCENT PACED: 93.96 %

## 2015-04-06 LAB — GLUCOSE, CAPILLARY: GLUCOSE-CAPILLARY: 130 mg/dL — AB (ref 65–99)

## 2015-04-06 LAB — BASIC METABOLIC PANEL
Anion gap: 5 (ref 5–15)
BUN: 20 mg/dL (ref 6–20)
CALCIUM: 8.3 mg/dL — AB (ref 8.9–10.3)
CO2: 24 mmol/L (ref 22–32)
CREATININE: 0.7 mg/dL (ref 0.61–1.24)
Chloride: 112 mmol/L — ABNORMAL HIGH (ref 101–111)
GFR calc Af Amer: >60 mL/min (ref >60)
GLUCOSE: 116 mg/dL — AB (ref 65–99)
Potassium: 4.4 mmol/L (ref 3.5–5.1)
SODIUM: 141 mmol/L (ref 135–145)

## 2015-04-06 LAB — CBC
HEMATOCRIT: 31.1 % — AB (ref 39.0–52.0)
Hemoglobin: 10.3 g/dL — ABNORMAL LOW (ref 13.0–17.0)
MCH: 31 pg (ref 26.0–34.0)
MCHC: 33.1 g/dL (ref 30.0–36.0)
MCV: 93.7 fL (ref 78.0–100.0)
PLATELETS: 111 10*3/uL — AB (ref 150–400)
RBC: 3.32 MIL/uL — ABNORMAL LOW (ref 4.22–5.81)
RDW: 15.3 % (ref 11.5–15.5)
WBC: 11.2 10*3/uL — AB (ref 4.0–10.5)

## 2015-04-06 LAB — PROTIME-INR
INR: 3.21 — ABNORMAL HIGH (ref 0.00–1.49)
PROTHROMBIN TIME: 32.2 s — AB (ref 11.6–15.2)

## 2015-04-06 MED ORDER — LISINOPRIL 2.5 MG PO TABS: 2.5000 mg | ORAL_TABLET | Freq: Two times a day (BID) | ORAL | Status: DC

## 2015-04-06 MED ORDER — FUROSEMIDE 40 MG PO TABS
40.0000 mg | ORAL_TABLET | ORAL | Status: DC
  Administered 2015-04-07: 40 mg via ORAL
  Filled 2015-04-06: qty 1

## 2015-04-06 MED ORDER — FUROSEMIDE 40 MG PO TABS
60.0000 mg | ORAL_TABLET | ORAL | Status: DC
  Administered 2015-04-06: 60 mg via ORAL
  Filled 2015-04-06: qty 1

## 2015-04-06 MED ORDER — CARVEDILOL 3.125 MG PO TABS
3.1250 mg | ORAL_TABLET | Freq: Two times a day (BID) | ORAL | Status: DC
  Administered 2015-04-06 – 2015-04-07 (×3): 3.125 mg via ORAL
  Filled 2015-04-06 (×3): qty 1

## 2015-04-06 MED ORDER — WARFARIN SODIUM 2 MG PO TABS
2.0000 mg | ORAL_TABLET | Freq: Once | ORAL | Status: AC
  Administered 2015-04-06: 2 mg via ORAL
  Filled 2015-04-06: qty 1

## 2015-04-06 NOTE — Progress Notes (Signed)
Physical Therapy Treatment Patient Details Name: Jon Butler MRN: [id number] DOB: March 11, 1935 Today's Date: 04/06/2015    History of Present Illness [name] is a 80 yo male with a history of chronic systolic HF EF 123456 due to NICM s/p CRT-D ([employer]), mechanical AVR (St Jude), LBBB, PAF S/P DC-CV 12/20/12, ETOH abuse and dementia and non-compliance. Patient admitted with UTI and likely PNA positive for sepsis.    PT Comments    Pt admitted with above diagnosis. Pt presents with functional limitations due to decreased strength, balance, and functional activity tolerance. Pt is steady with ambulation with RW, but is reliant on UE support for balance. Pt was not impulsive with activity today and seemed much more aware of his surroundings. Pt okay to walk with nursing, pt would benefit from ambulation around the unit (use gait belt) at least 2x per day to maintain current activity level. Pt will continue to benefit from PT services to improve independence with functional mobility to be able to return home with his wife.    Follow Up Recommendations  Home health PT;Supervision/Assistance - 24 hour     Equipment Recommendations  None recommended by PT    Recommendations for Other Services       Precautions / Restrictions Precautions Precautions: Fall Restrictions Weight Bearing Restrictions: No    Mobility  Bed Mobility Overal bed mobility: Needs Assistance Bed Mobility: Supine to Sit     Supine to sit: Min assist     General bed mobility comments: assisted to lift trunk (pt pulled up on my hand), to supine slow, but able to bring legs in bed safely  Transfers Overall transfer level: Needs assistance Equipment used: None Transfers: Sit to/from Stand Sit to Stand: Min guard         General transfer comment: Min Guard from low surface for safety and balance. Supervision with the walker.   Ambulation/Gait Ambulation/Gait assistance: Min guard Ambulation Distance  (Feet): 150 Feet Assistive device: Rolling walker (2 wheeled) Gait Pattern/deviations: Step-through pattern;Decreased stride length;Trunk flexed Gait velocity: decreased   General Gait Details: Pt steady with RW. Min Guard for safety, progressing to supervision towards end of walk. Pt was able to remember his room number and find his way back to his room.    Stairs            Wheelchair Mobility    Modified Rankin (Stroke Patients Only)       Balance Overall balance assessment: Needs assistance Sitting-balance support: Feet supported;Bilateral upper extremity supported Sitting balance-Leahy Scale: Fair Sitting balance - Comments: Fair with UE support.    Standing balance support: Bilateral upper extremity supported Standing balance-Leahy Scale: Fair Standing balance comment: Reliant on UE support.                     Cognition Arousal/Alertness: Awake/alert Behavior During Therapy: WFL for tasks assessed/performed Overall Cognitive Status: History of cognitive impairments - at baseline                      Exercises Other Exercises Other Exercises: 5x sit to stand with supervision. Pt needs UE support to stand from low surfaces. VC's for sequencing.     General Comments General comments (skin integrity, edema, etc.): Pt cooperative and agreeable to therapy.       Pertinent Vitals/Pain Pain Assessment: No/denies pain. Pt's vitals stable throughout activity on room air.     Home Living  Prior Function            PT Goals (current goals can now be found in the care plan section) Acute Rehab PT Goals Patient Stated Goal: to go home PT Goal Formulation: With patient Time For Goal Achievement: 04/12/15 Potential to Achieve Goals: Good Progress towards PT goals: Progressing toward goals    Frequency  Min 3X/week    PT Plan Current plan remains appropriate    Co-evaluation             End of Session  Equipment Utilized During Treatment: Gait belt Activity Tolerance: Patient tolerated treatment well Patient left: in chair;with call bell/phone within reach;with chair alarm set;with family/visitor present     Time: 0759-0820 PT Time Calculation (min) (ACUTE ONLY): 21 min  Charges:  $Gait Training: 8-22 mins                    G Codes:      Colon Branch, SPT Colon Branch 04/06/2015, 9:10 AM

## 2015-04-06 NOTE — Care Management Important Message (Signed)
Important Message  Patient Details  Name: Jon Butler MRN: [id number] Date of Birth: [birth year]   Medicare Important Message Given:  Yes    Belvin Gauss P Tamira Ryland 04/06/2015, 12:15 PM

## 2015-04-06 NOTE — Progress Notes (Signed)
Patient ID: Jon Butler, male   DOB: [birth year], 80 y.o.   MRN: [id number] [facility] PROGRESS NOTE  Dashiell Franchino Koffman [id number] DOB: [birth year] DOA: 04/04/2015 PCP: [name], MD  Brief narrative:    80 y.o. Male with past medical history of dementia, atrial fibrillation, AICD who presented to [facility] ED with worsening cough productive of white to yellow sputum, fevers of up to 102 F, tylenol provided some relief but he also took amoxicillin which did not result in significant symptomatic relief.  On admission, he was hemodynamically stable. His O2 sat was 78% on room air but it has improved to 9-96% with [place] oxygen support. Blood work showed WBC count 11.7, platelets 121, normal Cr. CXR showed persistent right lower lobe infiltrate and he was started on azithromycin and rocephin. Due to slight elevation in troponin level he was admitted to cardiac SDU and cardio has seen him in consultation.   Assessment/Plan:    Principal Problem: Acute respiratory failure with hypoxia / Right lower lobe pneumonia  - Likely due to pneumonia - Management as indicated below - No respiratory distress - Transfer to telemetry floor today   Active Problems:   Sepsis due to pneumonia / Leukocytosis  - Sepsis criteria met on admission with hypoxia, fever of 102.3 F, tachypnea, lactic acidosis and evidence of pneumonia on CXR - Continue azithromycin and Rocephin - Influenza is negative, HIV is negative  - Blood cultures are negative so far - Stable respiratory status     Anemia of chronic disease / thrombocytopenia - Likely secondary to anticoagulation - No reports of bleeding - Monitor CBC daily     Troponin elevation - Likely demand ischemia from hypoxia - Appreciate cardio following - Troponin level 0.25 --> 0.29 - No reports of chest pain  - Continue aspirin daily    Atrial fibrillation (HCC) / Biventricular implantable cardioverter-defibrillator in situ  -CHADS vasc score at least  3 - Continue AC with coumadin, dosing per pharmacy; slightly supratherapeutic INR - Rate controlled with amiodarone     Dementia without behavioral disturbance - Continue namenda and aricept    Dyslipidemia - Continue Crestor    DVT Prophylaxis  - Continue AC with coumadin   Code Status: DNR/DNI Family Communication:  plan of care discussed with the patient and his son at the bedside  Disposition Plan: home likely by 04/07/2015; transfer to telemetry today   IV access:  Peripheral IV  Procedures and diagnostic studies:    Dg Chest Port 1 View 2015-04-08 1. Persistent right lower lobe infiltrate without significant interim change. 2. Prior median sternotomy. AICD in stable position. Stable cardiomegaly. Electronically Signed   By: Marcello Moores  Register   On: 04/08/15 07:15   Dg Chest Port 1 View 04/04/2015  Infiltrate/ pneumonia right lower lobe. Follow-up to resolution after appropriate treatment is recommended. Electronically Signed   By: Lahoma Crocker M.D.   On: 04/04/2015 14:00   Medical Consultants:  Cardiology  Other Consultants:  None   IAnti-Infectives:   Azithromycin and rocephin 04/04/2015 -->    Leisa Lenz, MD  [facility] Pager [phone]  Time spent in minutes: 25 minutes  If 7PM-7AM, please contact night-coverage [email or web address] Password [email or web address] 04/06/2015, 12:04 PM   LOS: 2 days    HPI/Subjective: No acute overnight events. Patient reports no difficulty breathing.   Objective: Filed Vitals:   04/06/15 0400 04/06/15 0745 04/06/15 0820 04/06/15 1156  BP: 139/76  140/79 132/86  Pulse:   94 101  Temp: 98.7 F (  37.1 C)  97.8 F (36.6 C) 98.5 F (36.9 C)  TempSrc: Oral  Oral Oral  Resp: '25 19 23 21  '$ Height:      Weight: 83 kg (182 lb 15.7 oz)     SpO2: 96% 93% 95% 93%    Intake/Output Summary (Last 24 hours) at 04/06/15 1204 Last data filed at 04/06/15 0800  Gross per 24 hour  Intake   2900 ml  Output    100 ml  Net   2800 ml     Exam:   General:  Pt is alert, not in acute distress  Cardiovascular: Rate controlled, click appreciated, R0/Q7 appreciated   Respiratory: Clear to auscultation bilaterally, no wheezing, no crackles, no rhonchi  Abdomen: Soft, non tender, non distended, bowel sounds present  Extremities: No edema, pulses palpable bilaterally  Neuro: Grossly nonfocal  Data Reviewed: Basic Metabolic Panel:  Recent Labs Lab 04/04/15 1339 04/04/15 1750 04/05/15 0234 04/06/15 0440  NA 140  --  140 141  K 5.0  --  4.4 4.4  CL 107  --  112* 112*  CO2 20*  --  22 24  GLUCOSE 235*  --  138* 116*  BUN 45*  --  32* 20  CREATININE 1.07  --  0.78 0.70  CALCIUM 8.5*  --  7.9* 8.3*  MG  --  1.9  --   --   PHOS  --  4.1  --   --    Liver Function Tests:  Recent Labs Lab 04/04/15 1339 04/05/15 0234  AST 95* 54*  ALT 119* 83*  ALKPHOS 123 95  BILITOT 1.4* 0.7  PROT 6.0* 4.6*  ALBUMIN 2.8* 2.1*   No results for input(s): LIPASE, AMYLASE in the last 168 hours. No results for input(s): AMMONIA in the last 168 hours. CBC:  Recent Labs Lab 04/04/15 1339 04/05/15 0234  WBC 11.7* 11.2*  NEUTROABS 9.9*  --   HGB 13.2 10.3*  HCT 40.9 31.1*  MCV 94.7 93.7  PLT 121* 111*   Cardiac Enzymes:  Recent Labs Lab 04/04/15 1820 04/05/15 0234  TROPONINI 0.25* 0.29*   BNP: Invalid input(s): POCBNP CBG: No results for input(s): GLUCAP in the last 168 hours.  Recent Results (from the past 240 hour(s))  Blood Culture (routine x 2)     Status: None (Preliminary result)   Collection Time: 04/04/15  1:20 PM  Result Value Ref Range Status   Specimen Description BLOOD LEFT FOREARM  Final   Special Requests BOTTLES DRAWN AEROBIC ONLY 5CC  Final   Culture NO GROWTH 2 DAYS  Final   Report Status PENDING  Incomplete  Blood Culture (routine x 2)     Status: None (Preliminary result)   Collection Time: 04/04/15  1:38 PM  Result Value Ref Range Status   Specimen Description BLOOD RIGHT HAND   Final   Special Requests BOTTLES DRAWN AEROBIC AND ANAEROBIC 5CC  Final   Culture NO GROWTH 2 DAYS  Final   Report Status PENDING  Incomplete  Urine culture     Status: None (Preliminary result)   Collection Time: 04/04/15  2:00 PM  Result Value Ref Range Status   Specimen Description URINE, RANDOM  Final   Special Requests NONE  Final   Culture   Final    40,000 COLONIES/ml KLEBSIELLA PNEUMONIAE SUSCEPTIBILITIES TO FOLLOW    Report Status PENDING  Incomplete  MRSA PCR Screening     Status: None   Collection Time: 04/04/15  6:03 PM  Result Value Ref Range Status   MRSA by PCR NEGATIVE NEGATIVE Final    Comment:        The GeneXpert MRSA Assay (FDA approved for NASAL specimens only), is one component of a comprehensive MRSA colonization surveillance program. It is not intended to diagnose MRSA infection nor to guide or monitor treatment for MRSA infections.      Scheduled Meds: . allopurinol  300 mg Oral Daily  . amiodarone  200 mg Oral Daily  . aspirin EC  81 mg Oral Daily  . azithromycin  500 mg Intravenous Q24H  . carvedilol  3.125 mg Oral BID WC  . cefTRIAXone (ROCEPHIN)  IV  1 g Intravenous Q24H  . donepezil  23 mg Oral QHS  . feeding supplement (ENSURE ENLIVE)  237 mL Oral BID BM  . furosemide  60 mg Oral QODAY   And  . [START ON 04/07/2015] furosemide  40 mg Oral QODAY  . memantine  28 mg Oral Daily  . rosuvastatin  5 mg Oral Daily  . sodium chloride flush  3 mL Intravenous Q12H  . traZODone  400 mg Oral QHS  . warfarin  2 mg Oral ONCE-1800  . Warfarin - Pharmacist Dosing Inpatient   Does not apply [phone]

## 2015-04-06 NOTE — Progress Notes (Addendum)
md order for hhpt. Spoke w pt and son. Pt has hx of hhc but can't remember name of agency. He referred me his wife. Ck back prev hosp looks like used [facility] in past. Will try and get in touch w wife to arrange. Spoke w wife and [facility] is agency they use and only want [facility]. Referral to mary w [facility] and they req mark as home phy there they have had him before. Will alert [facility] at disch.

## 2015-04-06 NOTE — Progress Notes (Signed)
ANTICOAGULATION CONSULT NOTE - Follow Up Consult  Pharmacy Consult for Coumadin Indication: atrial fibrillation  No Known Allergies  Patient Measurements: Height: 5\' 9"  (175.3 cm) Weight: 182 lb 15.7 oz (83 kg) IBW/kg (Calculated) : 70.7  Vital Signs: Temp: 97.8 F (36.6 C) (03/31 0820) Temp Source: Oral (03/31 0820) BP: 140/79 mmHg (03/31 0820) Pulse Rate: 94 (03/31 0820)  Labs:  Recent Labs  04/04/15 1339 04/04/15 1820 04/05/15 0234 04/06/15 0440  HGB 13.2  --  10.3*  --   HCT 40.9  --  31.1*  --   PLT 121*  --  111*  --   LABPROT 30.7*  --  35.4* 32.2*  INR 3.01*  --  3.64* 3.21*  CREATININE 1.07  --  0.78 0.70  TROPONINI  --  0.25* 0.29*  --     Estimated Creatinine Clearance: 77.3 mL/min (by C-G formula based on Cr of 0.7).  Assessment: 79yof on coumadin pta for afib, admitted with sepsis. INR at goal on admit and small 0.5mg  dose given. INR jumped to 3.64 yesterday (possibly due to drug interaction with azithromycin) and dose held. Today's INR is trending back down to 3.21. No bleeding.  Home dose: 2mg  daily except 3mg  Sun, Tues, Fri  Goal of Therapy:  INR 2-3 Monitor platelets by anticoagulation protocol: Yes   Plan:  1) Restart coumadin 2mg  x 1 2) Daily INR  Deboraha Sprang 04/06/2015,9:53 AM

## 2015-04-06 NOTE — Progress Notes (Signed)
Advanced Heart Failure Rounding Note  Referring Physician: Dr. Charlies Silvers PCP: Dr Lysle Rubens EP: [facility] HF: [place]  Reason for Consultation: CHF/ Hypotension, ? shock  Subjective:    Feeling better.  Has been peeing.  Breathing improved.  Wants to know how much longer he will have to stay.   No output recorded despite strict I/O ordered.  He is up 8 lbs from admission.   Objective:   Weight Range: 182 lb 15.7 oz (83 kg) Body mass index is 27.01 kg/(m^2).   Vital Signs:   Temp:  [97.3 F (36.3 C)-99.1 F (37.3 C)] 98.7 F (37.1 C) (03/31 0400) Resp:  [18-27] 25 (03/31 0400) BP: (115-139)/(67-80) 139/76 mmHg (03/31 0400) SpO2:  [93 %-96 %] 96 % (03/31 0400) Weight:  [182 lb 15.7 oz (83 kg)] 182 lb 15.7 oz (83 kg) (03/31 0400) Last BM Date: 04/05/15  Weight change: Filed Weights   04/04/15 1800 04/05/15 0440 04/06/15 0400  Weight: 174 lb 2.6 oz (79 kg) 179 lb 0.2 oz (81.2 kg) 182 lb 15.7 oz (83 kg)    Intake/Output:   Intake/Output Summary (Last 24 hours) at 04/06/15 0747 Last data filed at 04/06/15 0700  Gross per 24 hour  Intake   3420 ml  Output      0 ml  Net   3420 ml     Physical Exam: General: NAD. Normal respiratory effort HEENT: normal Neck: supple. JVP 9-10 cm. Carotids 2+ bilat; no bruits. No thyromegaly or nodule noted Cor: PMI nondisplaced. RRR. No rubs, gallops or murmurs. Lungs: Scattered rhonchi, diminished RLL Abdomen: soft, NT/ND Extremities: no cyanosis, clubbing, rash. Trace ankle edema. Neuro: alert & orientedx3, cranial nerves grossly intact. moves all 4 extremities w/o difficulty. Affect pleasant  Telemetry: Reviewed, V pacing 90-100s  Labs: CBC  Recent Labs  04/04/15 1339 04/05/15 0234  WBC 11.7* 11.2*  NEUTROABS 9.9*  --   HGB 13.2 10.3*  HCT 40.9 31.1*  MCV 94.7 93.7  PLT 121* 99991111*   Basic Metabolic Panel  Recent Labs  04/04/15 1750 04/05/15 0234 04/06/15 0440  NA  --  140 141  K  --  4.4 4.4   CL  --  112* 112*  CO2  --  22 24  GLUCOSE  --  138* 116*  BUN  --  32* 20  CREATININE  --  0.78 0.70  CALCIUM  --  7.9* 8.3*  MG 1.9  --   --   PHOS 4.1  --   --    Liver Function Tests  Recent Labs  04/04/15 1339 04/05/15 0234  AST 95* 54*  ALT 119* 83*  ALKPHOS 123 95  BILITOT 1.4* 0.7  PROT 6.0* 4.6*  ALBUMIN 2.8* 2.1*   No results for input(s): LIPASE, AMYLASE in the last 72 hours. Cardiac Enzymes  Recent Labs  04/04/15 1820 04/05/15 0234  TROPONINI 0.25* 0.29*    BNP: BNP (last 3 results)  Recent Labs  08/09/14 0745 12/21/14 1611  BNP 2479.2* 1707.6*    ProBNP (last 3 results) No results for input(s): PROBNP in the last 8760 hours.   D-Dimer No results for input(s): DDIMER in the last 72 hours. Hemoglobin A1C No results for input(s): HGBA1C in the last 72 hours. Fasting Lipid Panel No results for input(s): CHOL, HDL, LDLCALC, TRIG, CHOLHDL, LDLDIRECT in the last 72 hours. Thyroid Function Tests No results for input(s): TSH, T4TOTAL, T3FREE, THYROIDAB in the last 72 hours.  Invalid input(s): FREET3  Other results:     Imaging/Studies:  Dg Chest Port 1 View  04/05/2015  CLINICAL DATA:  Sepsis.  Shortness of breath. EXAM: PORTABLE CHEST 1 VIEW COMPARISON:  04/04/2015. FINDINGS: Mediastinum hilar structures stable. Prior median sternotomy. AICD in stable position. Stable cardiomegaly. Persistent right lower lobe infiltrate. No interim change. No pleural effusion or pneumothorax. IMPRESSION: 1. Persistent right lower lobe infiltrate without significant interim change. 2. Prior median sternotomy. AICD in stable position. Stable cardiomegaly. Electronically Signed   By: Marcello Moores  Register   On: 04/05/2015 07:15   Dg Chest Port 1 View  04/04/2015  CLINICAL DATA:  Fever, hypoxia EXAM: PORTABLE CHEST 1 VIEW COMPARISON:  11/22/2014 FINDINGS: Cardiomediastinal silhouette is stable. Three leads cardiac pacemaker is unchanged in position. There is  infiltrate/ pneumonia right lower lobe. Follow-up to resolution after appropriate treatment is recommended. No pulmonary edema. Atherosclerotic calcifications of thoracic aorta again noted. IMPRESSION: Infiltrate/ pneumonia right lower lobe. Follow-up to resolution after appropriate treatment is recommended. Electronically Signed   By: Lahoma Crocker M.D.   On: 04/04/2015 14:00     Latest Echo  Latest Cath   Medications:     Scheduled Medications: . allopurinol  300 mg Oral Daily  . amiodarone  200 mg Oral Daily  . aspirin EC  81 mg Oral Daily  . azithromycin  500 mg Intravenous Q24H  . cefTRIAXone (ROCEPHIN)  IV  1 g Intravenous Q24H  . donepezil  23 mg Oral QHS  . feeding supplement (ENSURE ENLIVE)  237 mL Oral BID BM  . memantine  28 mg Oral Daily  . rosuvastatin  5 mg Oral Daily  . sodium chloride flush  3 mL Intravenous Q12H  . traZODone  400 mg Oral QHS  . Warfarin - Pharmacist Dosing Inpatient   Does not apply q1800     Infusions: . sodium chloride 100 mL/hr at 04/05/15 2310     PRN Medications:  acetaminophen **OR** acetaminophen, diltiazem, LORazepam, ondansetron **OR** ondansetron (ZOFRAN) IV, oxyCODONE   Assessment   1. Sepsis, ?PNA.  2. Chronic Systolic Heart Failure TEE with EF 15-20% S/P Medtronic CRT-D 09/2012  3. A Fib- S/P DC-CV 12/20/12 but reverted to AF. Had AFL in 11/1 and paced out.  4. CKD stage 3 5. Alcohol abuse 6. Baseline dementia.  7. Mechanical AVR - on coumadin. SBE prophylaxis for all procedures.   Plan    PNA management per primary.  Repeat Echo with EF mildly improved from 15-20% to 25-30%.     No recent optivol threshold crossings.  NSR.    Resume home lasix at alternating daily doses of 60 mg and 40 mg. Hold off on spiro for now with K 4.4. Will watch K for resumption of supp.   Resume coreg 3.125 BID.   Disposition per primary.  Continue resuming HF meds as BP tolerates.   Length of Stay: 2   Shirley Friar  PA-C 04/06/2015, 7:47 AM  Advanced Heart Failure Team Pager [phone] (M-F; 7a - 4p)  Please contact [facility] for night-coverage after hours (4p -7a ) and weekends on [email or web address]  Patient seen and examined with Oda Kilts, PA-C. We discussed all aspects of the encounter. I agree with the assessment and plan as stated above.   Looks good. Up in bathroom. Breathing better. JVP 6-7. Renal function ok. Agree with restarting lasix today at home dose. Maintaining NSR. Continue coumadin. Echo reviewed personally and EF slightly improved.  Can go home from our stand point. Will follow  as needed.   Johnathen Testa,MD 11:33 AM

## 2015-04-07 DIAGNOSIS — D72829 Elevated white blood cell count, unspecified: Secondary | ICD-10-CM

## 2015-04-07 LAB — URINE CULTURE

## 2015-04-07 LAB — PROTIME-INR
INR: 2.64 — AB (ref 0.00–1.49)
PROTHROMBIN TIME: 27.8 s — AB (ref 11.6–15.2)

## 2015-04-07 LAB — BASIC METABOLIC PANEL
Anion gap: 6 (ref 5–15)
BUN: 17 mg/dL (ref 6–20)
CALCIUM: 8.2 mg/dL — AB (ref 8.9–10.3)
CO2: 27 mmol/L (ref 22–32)
CREATININE: 0.71 mg/dL (ref 0.61–1.24)
Chloride: 108 mmol/L (ref 101–111)
GFR calc non Af Amer: >60 mL/min (ref >60)
Glucose, Bld: 105 mg/dL — ABNORMAL HIGH (ref 65–99)
Potassium: 3.8 mmol/L (ref 3.5–5.1)
SODIUM: 141 mmol/L (ref 135–145)

## 2015-04-07 LAB — MAGNESIUM: Magnesium: 1.8 mg/dL (ref 1.7–2.4)

## 2015-04-07 LAB — GLUCOSE, CAPILLARY: Glucose-Capillary: 97 mg/dL (ref 65–99)

## 2015-04-07 MED ORDER — POTASSIUM CHLORIDE CRYS ER 20 MEQ PO TBCR
40.0000 meq | EXTENDED_RELEASE_TABLET | Freq: Once | ORAL | Status: AC
  Administered 2015-04-07: 40 meq via ORAL
  Filled 2015-04-07: qty 2

## 2015-04-07 MED ORDER — MAGNESIUM SULFATE 2 GM/50ML IV SOLN
2.0000 g | Freq: Once | INTRAVENOUS | Status: AC
  Administered 2015-04-07: 2 g via INTRAVENOUS
  Filled 2015-04-07: qty 50

## 2015-04-07 MED ORDER — AMOXICILLIN-POT CLAVULANATE 875-125 MG PO TABS: 1.0000 | ORAL_TABLET | Freq: Two times a day (BID) | ORAL | Status: DC

## 2015-04-07 MED ORDER — FUROSEMIDE 10 MG/ML IJ SOLN
40.0000 mg | Freq: Once | INTRAMUSCULAR | Status: AC
  Administered 2015-04-07: 40 mg via INTRAVENOUS
  Filled 2015-04-07: qty 4

## 2015-04-07 MED ORDER — FUROSEMIDE 40 MG PO TABS: 40.0000 mg | ORAL_TABLET | ORAL | Status: DC

## 2015-04-07 NOTE — Discharge Instructions (Signed)

## 2015-04-07 NOTE — Discharge Summary (Signed)
Physician Discharge Summary  Jon Butler [facility] ZOX:[id number] DOB: [birth year] DOA: 04/04/2015  PCP: [name], MD  Admit date: 04/04/2015 Discharge date: 04/07/2015  Recommendations for Outpatient Follow-up:  Continue Augmentin on discharge for 7 days  Continue lasix 40 mg every other day  Continue coumadin per home regimen, recheck INR in PCP office Monday or Tuesday to make sure INR 2-3. INR is 2.64 prior to discharge  Discharge Diagnoses:  Active Problems:   Biventricular implantable cardioverter-defibrillator in situ   Atrial fibrillation (HCC)   Acute respiratory failure with hypoxia ([place])   Community acquired pneumonia   Sepsis ([place])   UTI (urinary tract infection)   Dementia without behavioral disturbance    Discharge Condition: stable   Diet recommendation: as tolerated   History of present illness:  80 y.o. Male with past medical history of dementia, atrial fibrillation, AICD who presented to [facility] ED with worsening cough productive of white to yellow sputum, fevers of up to 102 F, tylenol provided some relief but he also took amoxicillin which did not result in significant symptomatic relief.  On admission, he was hemodynamically stable. His O2 sat was 78% on room air but it has improved to 9-96% with Bent oxygen support. Blood work showed WBC count 11.7, platelets 121, normal Cr. CXR showed persistent right lower lobe infiltrate and he was started on azithromycin and rocephin. Due to slight elevation in troponin level he was admitted to cardiac SDU and cardio has seen him in consultation.   Hospital Course:   Assessment/Plan:    Principal Problem: Acute respiratory failure with hypoxia / Right lower lobe pneumonia  - Likely due to pneumonia - Stable resp status   Active Problems:  Sepsis due to pneumonia / Leukocytosis  - Sepsis criteria met on admission with hypoxia, fever of 102.3 F, tachypnea, lactic acidosis and evidence of pneumonia on CXR - Continue  azithromycin and Rocephin through discharge and then Augmentin on discharge for 7 days  - Influenza is negative, HIV is negative  - Blood cultures are negative so far   Anemia of chronic disease / thrombocytopenia - Likely secondary to anticoagulation - No reports of bleeding   Troponin elevation - Likely demand ischemia from hypoxia - Appreciate cardio recommendations  - Troponin level 0.25 --> 0.29 - No reports of chest pain  - Continue aspirin daily   Atrial fibrillation (HCC) / Biventricular implantable cardioverter-defibrillator in situ -CHADS vasc score at least 3 - Continue AC with coumadin, on discharge dose pre home regimen  - Rate controlled with amiodarone    Dementia without behavioral disturbance - Continue namenda and aricept   Dyslipidemia - Continue Crestor    DVT Prophylaxis  - Continue AC with coumadin   Code Status: DNR/DNI Family Communication: plan of care discussed with the patient and his son at the bedside   IV access:  Peripheral IV  Procedures and diagnostic studies:   Dg Chest Port 1 View 04/05/2015 1. Persistent right lower lobe infiltrate without significant interim change. 2. Prior median sternotomy. AICD in stable position. Stable cardiomegaly. Electronically Signed By: Marcello Moores Register On: 04/05/2015 07:15   Dg Chest Port 1 View 04/04/2015 Infiltrate/ pneumonia right lower lobe. Follow-up to resolution after appropriate treatment is recommended. Electronically Signed By: Lahoma Crocker M.D. On: 04/04/2015 14:00   Medical Consultants:  Cardiology  Other Consultants:  None   IAnti-Infectives:   Azithromycin and rocephin 04/04/2015 -->    Signed:  Leisa Lenz, MD  [facility] 04/07/2015, 10:26 AM  Pager #:  [phone]  Time spent in minutes: more than 30 minutes    Discharge Exam: Filed Vitals:   04/06/15 2000 04/07/15 0451  BP: 112/80 125/72  Pulse: 84 87  Temp: 98.9 F (37.2 C)  98.4 F (36.9 C)  Resp: 18 17   Filed Vitals:   04/06/15 1717 04/06/15 2000 04/07/15 0451 04/07/15 0600  BP: 142/73 112/80 125/72   Pulse: 90 84 87   Temp:  98.9 F (37.2 C) 98.4 F (36.9 C)   TempSrc:  Oral Oral   Resp:  18 17   Height:      Weight:    83.915 kg (185 lb)  SpO2: 93% 93% 93%     General: Pt is alert, follows commands appropriately, not in acute distress Cardiovascular: Regular rate and rhythm, L3/Y1 +, click appreciated  Respiratory: Clear to auscultation bilaterally, no wheezing, no crackles, no rhonchi Abdominal: Soft, non tender, non distended, bowel sounds +, no guarding Extremities: no edema, no cyanosis, pulses palpable bilaterally DP and PT Neuro: Grossly nonfocal  Discharge Instructions  Discharge Instructions    Call MD for:  difficulty breathing, headache or visual disturbances    Complete by:  As directed      Call MD for:  persistant dizziness or light-headedness    Complete by:  As directed      Call MD for:  persistant nausea and vomiting    Complete by:  As directed      Call MD for:  severe uncontrolled pain    Complete by:  As directed      Diet - low sodium heart healthy    Complete by:  As directed      Discharge instructions    Complete by:  As directed   Continue Augmentin on discharge for 7 days  Continue lasix 40 mg every other day  Continue coumadin per home regimen, recheck INR in PCP office Monday or Tuesday to make sure INR 2-3. INR is 2.64 prior to discharge     Increase activity slowly    Complete by:  As directed             Medication List    STOP taking these medications        amoxicillin 500 MG capsule  Commonly known as:  AMOXIL      TAKE these medications        allopurinol 300 MG tablet  Commonly known as:  ZYLOPRIM  Take 300 mg by mouth daily.     amiodarone 200 MG tablet  Commonly known as:  PACERONE  Take 1 tablet (200 mg total) by mouth daily.     amoxicillin-clavulanate 875-125 MG tablet   Commonly known as:  AUGMENTIN  Take 1 tablet by mouth 2 (two) times daily.     aspirin EC 81 MG tablet  Take 1 tablet (81 mg total) by mouth daily.     carvedilol 3.125 MG tablet  Commonly known as:  COREG  Take 3.125 mg by mouth 2 (two) times daily with a meal.     colchicine 0.6 MG tablet  Take 0.6 mg by mouth daily as needed (gout pain). Reported on 12/21/2014     donepezil 23 MG Tabs tablet  Commonly known as:  ARICEPT  Take 1 tablet (23 mg total) by mouth at bedtime.     fluticasone 50 MCG/ACT nasal spray  Commonly known as:  FLONASE  Place 2 sprays into both nostrils daily as needed for allergies or rhinitis. Reported  on 12/21/2014     furosemide 40 MG tablet  Commonly known as:  LASIX  Take 1 tablet (40 mg total) by mouth every other day.     lisinopril 10 MG tablet  Commonly known as:  PRINIVIL,ZESTRIL  Take 0.5 tablets (5 mg total) by mouth 2 (two) times daily.     LORazepam 0.5 MG tablet  Commonly known as:  ATIVAN  Take 1 tablet (0.5 mg total) by mouth every 8 (eight) hours as needed for anxiety.     memantine 28 MG Cp24 24 hr capsule  Commonly known as:  NAMENDA XR  Take 1 capsule (28 mg total) by mouth daily.     metolazone 2.5 MG tablet  Commonly known as:  ZAROXOLYN  Take 1 tablet (2.5 mg total) by mouth daily. As needed for weight gain     potassium chloride SA 20 MEQ tablet  Commonly known as:  K-DUR,KLOR-CON  Take 1 tablet (20 mEq total) by mouth daily. Take 1 extra tab when you take metolazone     rosuvastatin 10 MG tablet  Commonly known as:  CRESTOR  Take 5 mg by mouth daily. Reported on 03/22/2015     spironolactone 25 MG tablet  Commonly known as:  ALDACTONE  Take 0.5 tablets (12.5 mg total) by mouth daily.     thiamine 100 MG tablet  Commonly known as:  VITAMIN B-1  Take 1 tablet (100 mg total) by mouth 2 (two) times daily.     traZODone 100 MG tablet  Commonly known as:  DESYREL  Take 400 mg by mouth at bedtime.     warfarin 2 MG  tablet  Commonly known as:  COUMADIN  Take 1-1.5 tablets (2-3 mg total) by mouth daily at 6 PM. Take 2 mg (1 tablet) daily except on Sunday, Tuesday, and Friday. Take 3 mg (1.5 tablets) on Sunday, Tuesday, and Friday           Follow-up Information    Follow up with [name], MD. Schedule an appointment as soon as possible for a visit in 3 days.   Specialty:  Internal Medicine   Why:  Follow up appt after recent hospitalization; recheck INR   Contact information:   [address] [place] [address] [phone]        The results of significant diagnostics from this hospitalization (including imaging, microbiology, ancillary and laboratory) are listed below for reference.    Significant Diagnostic Studies: Dg Chest Port 1 View  04/05/2015  CLINICAL DATA:  Sepsis.  Shortness of breath. EXAM: PORTABLE CHEST 1 VIEW COMPARISON:  04/04/2015. FINDINGS: Mediastinum hilar structures stable. Prior median sternotomy. AICD in stable position. Stable cardiomegaly. Persistent right lower lobe infiltrate. No interim change. No pleural effusion or pneumothorax. IMPRESSION: 1. Persistent right lower lobe infiltrate without significant interim change. 2. Prior median sternotomy. AICD in stable position. Stable cardiomegaly. Electronically Signed   By: Marcello Moores  Register   On: 04/05/2015 07:15   Dg Chest Port 1 View  04/04/2015  CLINICAL DATA:  Fever, hypoxia EXAM: PORTABLE CHEST 1 VIEW COMPARISON:  11/22/2014 FINDINGS: Cardiomediastinal silhouette is stable. Three leads cardiac pacemaker is unchanged in position. There is infiltrate/ pneumonia right lower lobe. Follow-up to resolution after appropriate treatment is recommended. No pulmonary edema. Atherosclerotic calcifications of thoracic aorta again noted. IMPRESSION: Infiltrate/ pneumonia right lower lobe. Follow-up to resolution after appropriate treatment is recommended. Electronically Signed   By: Lahoma Crocker M.D.   On: 04/04/2015  14:00  Microbiology: Recent Results (from the past 240 hour(s))  Blood Culture (routine x 2)     Status: None (Preliminary result)   Collection Time: 04/04/15  1:20 PM  Result Value Ref Range Status   Specimen Description BLOOD LEFT FOREARM  Final   Special Requests BOTTLES DRAWN AEROBIC ONLY 5CC  Final   Culture NO GROWTH 2 DAYS  Final   Report Status PENDING  Incomplete  Blood Culture (routine x 2)     Status: None (Preliminary result)   Collection Time: 04/04/15  1:38 PM  Result Value Ref Range Status   Specimen Description BLOOD RIGHT HAND  Final   Special Requests BOTTLES DRAWN AEROBIC AND ANAEROBIC 5CC  Final   Culture NO GROWTH 2 DAYS  Final   Report Status PENDING  Incomplete  Urine culture     Status: None (Preliminary result)   Collection Time: 04/04/15  2:00 PM  Result Value Ref Range Status   Specimen Description URINE, RANDOM  Final   Special Requests NONE  Final   Culture   Final    40,000 COLONIES/ml KLEBSIELLA PNEUMONIAE SUSCEPTIBILITIES TO FOLLOW    Report Status PENDING  Incomplete  MRSA PCR Screening     Status: None   Collection Time: 04/04/15  6:03 PM  Result Value Ref Range Status   MRSA by PCR NEGATIVE NEGATIVE Final    Comment:        The GeneXpert MRSA Assay (FDA approved for NASAL specimens only), is one component of a comprehensive MRSA colonization surveillance program. It is not intended to diagnose MRSA infection nor to guide or monitor treatment for MRSA infections.      Labs: Basic Metabolic Panel:  Recent Labs Lab 04/04/15 1339 04/04/15 1750 04/05/15 0234 04/06/15 0440 04/07/15 0423 04/07/15 0826  NA 140  --  140 141 141  --   K 5.0  --  4.4 4.4 3.8  --   CL 107  --  112* 112* 108  --   CO2 20*  --  '22 24 27  '$ --   GLUCOSE 235*  --  138* 116* 105*  --   BUN 45*  --  32* 20 17  --   CREATININE 1.07  --  0.78 0.70 0.71  --   CALCIUM 8.5*  --  7.9* 8.3* 8.2*  --   MG  --  1.9  --   --   --  1.8  PHOS  --  4.1  --   --    --   --    Liver Function Tests:  Recent Labs Lab 04/04/15 1339 04/05/15 0234  AST 95* 54*  ALT 119* 83*  ALKPHOS 123 95  BILITOT 1.4* 0.7  PROT 6.0* 4.6*  ALBUMIN 2.8* 2.1*   No results for input(s): LIPASE, AMYLASE in the last 168 hours. No results for input(s): AMMONIA in the last 168 hours. CBC:  Recent Labs Lab 04/04/15 1339 04/05/15 0234  WBC 11.7* 11.2*  NEUTROABS 9.9*  --   HGB 13.2 10.3*  HCT 40.9 31.1*  MCV 94.7 93.7  PLT 121* 111*   Cardiac Enzymes:  Recent Labs Lab 04/04/15 1820 04/05/15 0234  TROPONINI 0.25* 0.29*   BNP: BNP (last 3 results)  Recent Labs  08/09/14 0745 12/21/14 1611  BNP 2479.2* 1707.6*    ProBNP (last 3 results) No results for input(s): PROBNP in the last 8760 hours.  CBG:  Recent Labs Lab 04/06/15 2246 04/07/15 0644  GLUCAP 130* 97

## 2015-04-07 NOTE — Progress Notes (Signed)
Practitioner on call notified about patient having back to back 5 and 8 runs beats of V-Tach respectively. Serum magnesium ordered.patient asymptomatic, will keep monitoring.

## 2015-04-07 NOTE — Progress Notes (Signed)
Advanced Heart Failure Rounding Note  Referring Physician: Dr. Charlies Silvers PCP: Dr Lysle Rubens EP: [facility] HF: [place]  Reason for Consultation: CHF/ Hypotension, ? shock  Subjective:    Feeling better. Breathing fine. No fevers or chills. Back on home diuretics. Weight climbing. (baseline weight 175 he is 182 today but was bed weight) Had multiple  brief runs of NSVT overnight. K 3.8 Mag pending  Objective:   Weight Range: 83.915 kg (185 lb) Body mass index is 27.31 kg/(m^2).   Vital Signs:   Temp:  [97.8 F (36.6 C)-98.9 F (37.2 C)] 98.4 F (36.9 C) (04/01 0451) Pulse Rate:  [84-101] 87 (04/01 0451) Resp:  [17-23] 17 (04/01 0451) BP: (112-142)/(72-86) 125/72 mmHg (04/01 0451) SpO2:  [93 %-95 %] 93 % (04/01 0451) Weight:  [83.915 kg (185 lb)] 83.915 kg (185 lb) (04/01 0600) Last BM Date: 04/06/15  Weight change: Filed Weights   04/05/15 0440 04/06/15 0400 04/07/15 0600  Weight: 81.2 kg (179 lb 0.2 oz) 83 kg (182 lb 15.7 oz) 83.915 kg (185 lb)    Intake/Output:   Intake/Output Summary (Last 24 hours) at 04/07/15 0803 Last data filed at 04/06/15 1500  Gross per 24 hour  Intake    240 ml  Output    150 ml  Net     90 ml     Physical Exam: General: NAD. Normal respiratory effort HEENT: normal Neck: supple. JVP 9 cm. Carotids 2+ bilat; no bruits. No thyromegaly or nodule noted Cor: PMI nondisplaced. RRR. No rubs, gallops or murmurs. Lungs: Scattered rhonchi, diminished RLL Abdomen: soft, NT/ND Extremities: no cyanosis, clubbing, rash. 1+ edema. Neuro: alert & orientedx3, cranial nerves grossly intact. moves all 4 extremities w/o difficulty. Affect pleasant  Telemetry: Reviewed, sinus V pacing 90-100s  Many PVCs Frequent runs NSVT ~ 5 beats  Labs: CBC  Recent Labs  04/04/15 1339 04/05/15 0234  WBC 11.7* 11.2*  NEUTROABS 9.9*  --   HGB 13.2 10.3*  HCT 40.9 31.1*  MCV 94.7 93.7  PLT 121* 99991111*   Basic Metabolic Panel  Recent Labs  04/04/15 1750  04/06/15 0440 04/07/15 0423  NA  --   < > 141 141  K  --   < > 4.4 3.8  CL  --   < > 112* 108  CO2  --   < > 24 27  GLUCOSE  --   < > 116* 105*  BUN  --   < > 20 17  CREATININE  --   < > 0.70 0.71  CALCIUM  --   < > 8.3* 8.2*  MG 1.9  --   --   --   PHOS 4.1  --   --   --   < > = values in this interval not displayed. Liver Function Tests  Recent Labs  04/04/15 1339 04/05/15 0234  AST 95* 54*  ALT 119* 83*  ALKPHOS 123 95  BILITOT 1.4* 0.7  PROT 6.0* 4.6*  ALBUMIN 2.8* 2.1*   No results for input(s): LIPASE, AMYLASE in the last 72 hours. Cardiac Enzymes  Recent Labs  04/04/15 1820 04/05/15 0234  TROPONINI 0.25* 0.29*    BNP: BNP (last 3 results)  Recent Labs  08/09/14 0745 12/21/14 1611  BNP 2479.2* 1707.6*    ProBNP (last 3 results) No results for input(s): PROBNP in the last 8760 hours.   D-Dimer No results for input(s): DDIMER in the last 72 hours. Hemoglobin A1C No results for input(s):  HGBA1C in the last 72 hours. Fasting Lipid Panel No results for input(s): CHOL, HDL, LDLCALC, TRIG, CHOLHDL, LDLDIRECT in the last 72 hours. Thyroid Function Tests No results for input(s): TSH, T4TOTAL, T3FREE, THYROIDAB in the last 72 hours.  Invalid input(s): FREET3  Other results:     Imaging/Studies:  No results found.  Latest Echo  Latest Cath   Medications:     Scheduled Medications: . allopurinol  300 mg Oral Daily  . amiodarone  200 mg Oral Daily  . aspirin EC  81 mg Oral Daily  . azithromycin  500 mg Intravenous Q24H  . carvedilol  3.125 mg Oral BID WC  . cefTRIAXone (ROCEPHIN)  IV  1 g Intravenous Q24H  . donepezil  23 mg Oral QHS  . feeding supplement (ENSURE ENLIVE)  237 mL Oral BID BM  . furosemide  60 mg Oral QODAY   And  . furosemide  40 mg Oral QODAY  . memantine  28 mg Oral Daily  . rosuvastatin  5 mg Oral Daily  . sodium chloride flush  3 mL Intravenous Q12H  . traZODone  400 mg Oral QHS  . Warfarin -  Pharmacist Dosing Inpatient   Does not apply q1800    Infusions:    PRN Medications: acetaminophen **OR** acetaminophen, LORazepam, ondansetron **OR** ondansetron (ZOFRAN) IV, oxyCODONE   Assessment   1. Sepsis, ?PNA.  2. Chronic Systolic Heart Failure TEE with EF 15-20% S/P Medtronic CRT-D 09/2012 -> 25-30%  3. A Fib- S/P DC-CV 12/20/12 but reverted to AF. Had AFL in 11/1 and paced out.  4. CKD stage 3 5. Alcohol abuse 6. Baseline dementia.  7. Mechanical AVR - on coumadin. SBE prophylaxis for all procedures.  8. NSVT   Plan     Looks good. Eager to go home. Will give one dose IV lasix today. Supp K and Mag. Can go home from our standpoint. Would resume previous home meds.   Ioannis Schuh,MD 8:03 AM

## 2015-04-09 LAB — CULTURE, BLOOD (ROUTINE X 2)
CULTURE: NO GROWTH
Culture: NO GROWTH

## 2015-04-11 ENCOUNTER — Ambulatory Visit (INDEPENDENT_AMBULATORY_CARE_PROVIDER_SITE_OTHER): Payer: Medicare Other | Admitting: Internal Medicine

## 2015-04-11 DIAGNOSIS — I482 Chronic atrial fibrillation, unspecified: Secondary | ICD-10-CM

## 2015-04-11 DIAGNOSIS — I5032 Chronic diastolic (congestive) heart failure: Secondary | ICD-10-CM | POA: Diagnosis not present

## 2015-04-11 DIAGNOSIS — Z95810 Presence of automatic (implantable) cardiac defibrillator: Secondary | ICD-10-CM

## 2015-04-11 DIAGNOSIS — J189 Pneumonia, unspecified organism: Secondary | ICD-10-CM | POA: Diagnosis not present

## 2015-04-11 DIAGNOSIS — M109 Gout, unspecified: Secondary | ICD-10-CM | POA: Diagnosis not present

## 2015-04-11 DIAGNOSIS — I5022 Chronic systolic (congestive) heart failure: Secondary | ICD-10-CM | POA: Diagnosis not present

## 2015-04-11 DIAGNOSIS — F0390 Unspecified dementia without behavioral disturbance: Secondary | ICD-10-CM | POA: Diagnosis not present

## 2015-04-11 DIAGNOSIS — I4891 Unspecified atrial fibrillation: Secondary | ICD-10-CM | POA: Diagnosis not present

## 2015-04-11 DIAGNOSIS — N390 Urinary tract infection, site not specified: Secondary | ICD-10-CM | POA: Diagnosis not present

## 2015-04-11 DIAGNOSIS — Z5181 Encounter for therapeutic drug level monitoring: Secondary | ICD-10-CM

## 2015-04-11 LAB — POCT INR: INR: 4.4

## 2015-04-13 DIAGNOSIS — I4891 Unspecified atrial fibrillation: Secondary | ICD-10-CM | POA: Diagnosis not present

## 2015-04-13 DIAGNOSIS — N390 Urinary tract infection, site not specified: Secondary | ICD-10-CM | POA: Diagnosis not present

## 2015-04-13 DIAGNOSIS — M109 Gout, unspecified: Secondary | ICD-10-CM | POA: Diagnosis not present

## 2015-04-13 DIAGNOSIS — I5032 Chronic diastolic (congestive) heart failure: Secondary | ICD-10-CM | POA: Diagnosis not present

## 2015-04-13 DIAGNOSIS — F0390 Unspecified dementia without behavioral disturbance: Secondary | ICD-10-CM | POA: Diagnosis not present

## 2015-04-13 DIAGNOSIS — J189 Pneumonia, unspecified organism: Secondary | ICD-10-CM | POA: Diagnosis not present

## 2015-04-16 ENCOUNTER — Other Ambulatory Visit (HOSPITAL_COMMUNITY): Payer: Self-pay | Admitting: *Deleted

## 2015-04-16 ENCOUNTER — Telehealth: Payer: Self-pay | Admitting: *Deleted

## 2015-04-16 DIAGNOSIS — J189 Pneumonia, unspecified organism: Secondary | ICD-10-CM | POA: Diagnosis not present

## 2015-04-16 DIAGNOSIS — I5032 Chronic diastolic (congestive) heart failure: Secondary | ICD-10-CM | POA: Diagnosis not present

## 2015-04-16 DIAGNOSIS — F0390 Unspecified dementia without behavioral disturbance: Secondary | ICD-10-CM | POA: Diagnosis not present

## 2015-04-16 DIAGNOSIS — I4891 Unspecified atrial fibrillation: Secondary | ICD-10-CM | POA: Diagnosis not present

## 2015-04-16 DIAGNOSIS — N390 Urinary tract infection, site not specified: Secondary | ICD-10-CM | POA: Diagnosis not present

## 2015-04-16 DIAGNOSIS — M109 Gout, unspecified: Secondary | ICD-10-CM | POA: Diagnosis not present

## 2015-04-16 MED ORDER — AMIODARONE HCL 200 MG PO TABS: 200.0000 mg | ORAL_TABLET | Freq: Every day | ORAL | Status: DC

## 2015-04-16 NOTE — Telephone Encounter (Signed)
LVM for wife requesting call back re: medication refill request from pharmacy. Left name, number.

## 2015-04-19 ENCOUNTER — Ambulatory Visit (INDEPENDENT_AMBULATORY_CARE_PROVIDER_SITE_OTHER): Payer: Medicare Other | Admitting: Internal Medicine

## 2015-04-19 DIAGNOSIS — N390 Urinary tract infection, site not specified: Secondary | ICD-10-CM | POA: Diagnosis not present

## 2015-04-19 DIAGNOSIS — M109 Gout, unspecified: Secondary | ICD-10-CM | POA: Diagnosis not present

## 2015-04-19 DIAGNOSIS — Z5181 Encounter for therapeutic drug level monitoring: Secondary | ICD-10-CM

## 2015-04-19 DIAGNOSIS — F0390 Unspecified dementia without behavioral disturbance: Secondary | ICD-10-CM | POA: Diagnosis not present

## 2015-04-19 DIAGNOSIS — I482 Chronic atrial fibrillation, unspecified: Secondary | ICD-10-CM

## 2015-04-19 DIAGNOSIS — I4891 Unspecified atrial fibrillation: Secondary | ICD-10-CM | POA: Diagnosis not present

## 2015-04-19 DIAGNOSIS — J189 Pneumonia, unspecified organism: Secondary | ICD-10-CM | POA: Diagnosis not present

## 2015-04-19 DIAGNOSIS — I5032 Chronic diastolic (congestive) heart failure: Secondary | ICD-10-CM | POA: Diagnosis not present

## 2015-04-19 DIAGNOSIS — R791 Abnormal coagulation profile: Secondary | ICD-10-CM | POA: Diagnosis not present

## 2015-04-19 DIAGNOSIS — Z95810 Presence of automatic (implantable) cardiac defibrillator: Secondary | ICD-10-CM

## 2015-04-19 LAB — POCT INR: INR: 6.5

## 2015-04-19 LAB — PROTIME-INR: INR: 5.8 — AB (ref 0.9–1.1)

## 2015-04-23 ENCOUNTER — Ambulatory Visit (INDEPENDENT_AMBULATORY_CARE_PROVIDER_SITE_OTHER): Payer: Medicare Other | Admitting: Cardiovascular Disease

## 2015-04-23 DIAGNOSIS — I482 Chronic atrial fibrillation, unspecified: Secondary | ICD-10-CM

## 2015-04-23 DIAGNOSIS — Z5181 Encounter for therapeutic drug level monitoring: Secondary | ICD-10-CM

## 2015-04-23 DIAGNOSIS — F0390 Unspecified dementia without behavioral disturbance: Secondary | ICD-10-CM | POA: Diagnosis not present

## 2015-04-23 DIAGNOSIS — N390 Urinary tract infection, site not specified: Secondary | ICD-10-CM | POA: Diagnosis not present

## 2015-04-23 DIAGNOSIS — I5032 Chronic diastolic (congestive) heart failure: Secondary | ICD-10-CM | POA: Diagnosis not present

## 2015-04-23 DIAGNOSIS — M109 Gout, unspecified: Secondary | ICD-10-CM | POA: Diagnosis not present

## 2015-04-23 DIAGNOSIS — Z95810 Presence of automatic (implantable) cardiac defibrillator: Secondary | ICD-10-CM

## 2015-04-23 DIAGNOSIS — J189 Pneumonia, unspecified organism: Secondary | ICD-10-CM | POA: Diagnosis not present

## 2015-04-23 DIAGNOSIS — I4891 Unspecified atrial fibrillation: Secondary | ICD-10-CM | POA: Diagnosis not present

## 2015-04-23 LAB — POCT INR: INR: 2.5

## 2015-04-24 ENCOUNTER — Telehealth: Payer: Self-pay | Admitting: Diagnostic Neuroimaging

## 2015-04-24 DIAGNOSIS — M109 Gout, unspecified: Secondary | ICD-10-CM | POA: Diagnosis not present

## 2015-04-24 DIAGNOSIS — I5032 Chronic diastolic (congestive) heart failure: Secondary | ICD-10-CM | POA: Diagnosis not present

## 2015-04-24 DIAGNOSIS — F0390 Unspecified dementia without behavioral disturbance: Secondary | ICD-10-CM | POA: Diagnosis not present

## 2015-04-24 DIAGNOSIS — I4891 Unspecified atrial fibrillation: Secondary | ICD-10-CM | POA: Diagnosis not present

## 2015-04-24 DIAGNOSIS — N390 Urinary tract infection, site not specified: Secondary | ICD-10-CM | POA: Diagnosis not present

## 2015-04-24 DIAGNOSIS — J189 Pneumonia, unspecified organism: Secondary | ICD-10-CM | POA: Diagnosis not present

## 2015-04-24 MED ORDER — DONEPEZIL HCL 23 MG PO TABS: 23.0000 mg | ORAL_TABLET | Freq: Every day | ORAL | Status: DC

## 2015-04-24 NOTE — Telephone Encounter (Signed)
Patient is calling back in regard to the Rx donepezil 23 mg tabs.  She states [facility], [place], [place] faxed a request for a Rx on 04/16/15. Phone [phone]. Patient's wife stated she did not pick up your message but can be reached [phone].  Thanks!

## 2015-04-24 NOTE — Addendum Note (Signed)
Addended by: Minna Antis on: 04/24/2015 05:09 PM   Modules accepted: Orders

## 2015-04-24 NOTE — Telephone Encounter (Signed)
Spoke with wife and informed her that in patient's last OV in Feb 2017 Dr Leta Baptist released him to his PCP. She should contact PCP for refills. Informed her this office will refill x 1 month, advised she call PCP but if there is a problem,m, advised she call back. She verbalized understanding, appreciation.

## 2015-04-26 DIAGNOSIS — N390 Urinary tract infection, site not specified: Secondary | ICD-10-CM | POA: Diagnosis not present

## 2015-04-26 DIAGNOSIS — F0390 Unspecified dementia without behavioral disturbance: Secondary | ICD-10-CM | POA: Diagnosis not present

## 2015-04-26 DIAGNOSIS — I4891 Unspecified atrial fibrillation: Secondary | ICD-10-CM | POA: Diagnosis not present

## 2015-04-26 DIAGNOSIS — J189 Pneumonia, unspecified organism: Secondary | ICD-10-CM | POA: Diagnosis not present

## 2015-04-26 DIAGNOSIS — M109 Gout, unspecified: Secondary | ICD-10-CM | POA: Diagnosis not present

## 2015-04-26 DIAGNOSIS — I5032 Chronic diastolic (congestive) heart failure: Secondary | ICD-10-CM | POA: Diagnosis not present

## 2015-04-27 ENCOUNTER — Telehealth (HOSPITAL_COMMUNITY): Payer: Self-pay | Admitting: *Deleted

## 2015-04-27 NOTE — Telephone Encounter (Signed)
Pt's wife called concerned about pt, she states he was recently hospitalized the first of hte month and since he has been home he has no appetite and isn't really eating and his wt has dropped down to 155 lb.  He was running 165-170 lb prior to the hospitalization.  The d/c summary states lasix 40 mg QOD however she states the pcp told her to increase it to daily and that is what he has been doing but she feels it is too much for him.  She states he has no SOB and no edema, his legs and ankles are skinny.  Discussed w/Dr Bensimhon, recommends changing Lasix to as needed.  Wife is aware and agreeable, advised as soon as he has any swelling she should give the lasix not to wait until he is very swollen, she again is agreeable and verbalizes understanding

## 2015-04-30 ENCOUNTER — Ambulatory Visit (INDEPENDENT_AMBULATORY_CARE_PROVIDER_SITE_OTHER): Payer: Medicare Other | Admitting: Cardiology

## 2015-04-30 ENCOUNTER — Other Ambulatory Visit (INDEPENDENT_AMBULATORY_CARE_PROVIDER_SITE_OTHER): Payer: Medicare Other | Admitting: *Deleted

## 2015-04-30 DIAGNOSIS — Z95810 Presence of automatic (implantable) cardiac defibrillator: Secondary | ICD-10-CM

## 2015-04-30 DIAGNOSIS — Z5181 Encounter for therapeutic drug level monitoring: Secondary | ICD-10-CM

## 2015-04-30 DIAGNOSIS — I482 Chronic atrial fibrillation, unspecified: Secondary | ICD-10-CM

## 2015-04-30 DIAGNOSIS — J189 Pneumonia, unspecified organism: Secondary | ICD-10-CM | POA: Diagnosis not present

## 2015-04-30 DIAGNOSIS — F0390 Unspecified dementia without behavioral disturbance: Secondary | ICD-10-CM | POA: Diagnosis not present

## 2015-04-30 DIAGNOSIS — N390 Urinary tract infection, site not specified: Secondary | ICD-10-CM | POA: Diagnosis not present

## 2015-04-30 DIAGNOSIS — I4891 Unspecified atrial fibrillation: Secondary | ICD-10-CM | POA: Diagnosis not present

## 2015-04-30 DIAGNOSIS — I5032 Chronic diastolic (congestive) heart failure: Secondary | ICD-10-CM | POA: Diagnosis not present

## 2015-04-30 DIAGNOSIS — M109 Gout, unspecified: Secondary | ICD-10-CM | POA: Diagnosis not present

## 2015-04-30 LAB — POCT INR: INR: 6.6

## 2015-04-30 LAB — PROTIME-INR
INR: 4.9 — ABNORMAL HIGH (ref 0.00–1.49)
Prothrombin Time: 44.3 seconds — ABNORMAL HIGH (ref 11.6–15.2)

## 2015-04-30 NOTE — Addendum Note (Signed)
Addended by: Eulis Foster on: 04/30/2015 04:07 PM   Modules accepted: Orders

## 2015-05-01 DIAGNOSIS — M109 Gout, unspecified: Secondary | ICD-10-CM | POA: Diagnosis not present

## 2015-05-01 DIAGNOSIS — I5032 Chronic diastolic (congestive) heart failure: Secondary | ICD-10-CM | POA: Diagnosis not present

## 2015-05-01 DIAGNOSIS — J189 Pneumonia, unspecified organism: Secondary | ICD-10-CM | POA: Diagnosis not present

## 2015-05-01 DIAGNOSIS — I4891 Unspecified atrial fibrillation: Secondary | ICD-10-CM | POA: Diagnosis not present

## 2015-05-01 DIAGNOSIS — N390 Urinary tract infection, site not specified: Secondary | ICD-10-CM | POA: Diagnosis not present

## 2015-05-01 DIAGNOSIS — F0390 Unspecified dementia without behavioral disturbance: Secondary | ICD-10-CM | POA: Diagnosis not present

## 2015-05-03 DIAGNOSIS — M109 Gout, unspecified: Secondary | ICD-10-CM | POA: Diagnosis not present

## 2015-05-03 DIAGNOSIS — N390 Urinary tract infection, site not specified: Secondary | ICD-10-CM | POA: Diagnosis not present

## 2015-05-03 DIAGNOSIS — I5032 Chronic diastolic (congestive) heart failure: Secondary | ICD-10-CM | POA: Diagnosis not present

## 2015-05-03 DIAGNOSIS — I4891 Unspecified atrial fibrillation: Secondary | ICD-10-CM | POA: Diagnosis not present

## 2015-05-03 DIAGNOSIS — J189 Pneumonia, unspecified organism: Secondary | ICD-10-CM | POA: Diagnosis not present

## 2015-05-03 DIAGNOSIS — F0390 Unspecified dementia without behavioral disturbance: Secondary | ICD-10-CM | POA: Diagnosis not present

## 2015-05-07 ENCOUNTER — Ambulatory Visit (INDEPENDENT_AMBULATORY_CARE_PROVIDER_SITE_OTHER): Payer: Medicare Other | Admitting: Internal Medicine

## 2015-05-07 DIAGNOSIS — M109 Gout, unspecified: Secondary | ICD-10-CM | POA: Diagnosis not present

## 2015-05-07 DIAGNOSIS — I482 Chronic atrial fibrillation, unspecified: Secondary | ICD-10-CM

## 2015-05-07 DIAGNOSIS — I4891 Unspecified atrial fibrillation: Secondary | ICD-10-CM | POA: Diagnosis not present

## 2015-05-07 DIAGNOSIS — N390 Urinary tract infection, site not specified: Secondary | ICD-10-CM | POA: Diagnosis not present

## 2015-05-07 DIAGNOSIS — Z5181 Encounter for therapeutic drug level monitoring: Secondary | ICD-10-CM

## 2015-05-07 DIAGNOSIS — I5032 Chronic diastolic (congestive) heart failure: Secondary | ICD-10-CM | POA: Diagnosis not present

## 2015-05-07 DIAGNOSIS — Z95810 Presence of automatic (implantable) cardiac defibrillator: Secondary | ICD-10-CM

## 2015-05-07 DIAGNOSIS — J189 Pneumonia, unspecified organism: Secondary | ICD-10-CM | POA: Diagnosis not present

## 2015-05-07 DIAGNOSIS — F0390 Unspecified dementia without behavioral disturbance: Secondary | ICD-10-CM | POA: Diagnosis not present

## 2015-05-07 LAB — POCT INR: INR: 3.4

## 2015-05-08 DIAGNOSIS — J189 Pneumonia, unspecified organism: Secondary | ICD-10-CM | POA: Diagnosis not present

## 2015-05-08 DIAGNOSIS — F0390 Unspecified dementia without behavioral disturbance: Secondary | ICD-10-CM | POA: Diagnosis not present

## 2015-05-08 DIAGNOSIS — I5032 Chronic diastolic (congestive) heart failure: Secondary | ICD-10-CM | POA: Diagnosis not present

## 2015-05-08 DIAGNOSIS — N390 Urinary tract infection, site not specified: Secondary | ICD-10-CM | POA: Diagnosis not present

## 2015-05-08 DIAGNOSIS — M109 Gout, unspecified: Secondary | ICD-10-CM | POA: Diagnosis not present

## 2015-05-08 DIAGNOSIS — I4891 Unspecified atrial fibrillation: Secondary | ICD-10-CM | POA: Diagnosis not present

## 2015-05-09 ENCOUNTER — Other Ambulatory Visit: Payer: Self-pay | Admitting: Internal Medicine

## 2015-05-09 ENCOUNTER — Ambulatory Visit
Admission: RE | Admit: 2015-05-09 | Discharge: 2015-05-09 | Disposition: A | Discharge status: Home / Self Care | Payer: Medicare Other | Source: Ambulatory Visit | Attending: Internal Medicine | Admitting: Internal Medicine

## 2015-05-09 DIAGNOSIS — J189 Pneumonia, unspecified organism: Secondary | ICD-10-CM | POA: Diagnosis not present

## 2015-05-09 DIAGNOSIS — F0390 Unspecified dementia without behavioral disturbance: Secondary | ICD-10-CM | POA: Diagnosis not present

## 2015-05-09 DIAGNOSIS — E039 Hypothyroidism, unspecified: Secondary | ICD-10-CM | POA: Diagnosis not present

## 2015-05-09 DIAGNOSIS — R946 Abnormal results of thyroid function studies: Secondary | ICD-10-CM | POA: Diagnosis not present

## 2015-05-09 DIAGNOSIS — F419 Anxiety disorder, unspecified: Secondary | ICD-10-CM | POA: Diagnosis not present

## 2015-05-10 ENCOUNTER — Other Ambulatory Visit: Payer: Self-pay | Admitting: Internal Medicine

## 2015-05-10 DIAGNOSIS — I4891 Unspecified atrial fibrillation: Secondary | ICD-10-CM | POA: Diagnosis not present

## 2015-05-10 DIAGNOSIS — M109 Gout, unspecified: Secondary | ICD-10-CM | POA: Diagnosis not present

## 2015-05-10 DIAGNOSIS — E0590 Thyrotoxicosis, unspecified without thyrotoxic crisis or storm: Secondary | ICD-10-CM

## 2015-05-10 DIAGNOSIS — N390 Urinary tract infection, site not specified: Secondary | ICD-10-CM | POA: Diagnosis not present

## 2015-05-10 DIAGNOSIS — I5032 Chronic diastolic (congestive) heart failure: Secondary | ICD-10-CM | POA: Diagnosis not present

## 2015-05-10 DIAGNOSIS — F0390 Unspecified dementia without behavioral disturbance: Secondary | ICD-10-CM | POA: Diagnosis not present

## 2015-05-10 DIAGNOSIS — J189 Pneumonia, unspecified organism: Secondary | ICD-10-CM | POA: Diagnosis not present

## 2015-05-14 ENCOUNTER — Ambulatory Visit (INDEPENDENT_AMBULATORY_CARE_PROVIDER_SITE_OTHER): Payer: Medicare Other | Admitting: Interventional Cardiology

## 2015-05-14 DIAGNOSIS — I4891 Unspecified atrial fibrillation: Secondary | ICD-10-CM | POA: Diagnosis not present

## 2015-05-14 DIAGNOSIS — J189 Pneumonia, unspecified organism: Secondary | ICD-10-CM | POA: Diagnosis not present

## 2015-05-14 DIAGNOSIS — I482 Chronic atrial fibrillation, unspecified: Secondary | ICD-10-CM

## 2015-05-14 DIAGNOSIS — F0390 Unspecified dementia without behavioral disturbance: Secondary | ICD-10-CM | POA: Diagnosis not present

## 2015-05-14 DIAGNOSIS — I5032 Chronic diastolic (congestive) heart failure: Secondary | ICD-10-CM | POA: Diagnosis not present

## 2015-05-14 DIAGNOSIS — Z5181 Encounter for therapeutic drug level monitoring: Secondary | ICD-10-CM

## 2015-05-14 DIAGNOSIS — Z95810 Presence of automatic (implantable) cardiac defibrillator: Secondary | ICD-10-CM

## 2015-05-14 DIAGNOSIS — M109 Gout, unspecified: Secondary | ICD-10-CM | POA: Diagnosis not present

## 2015-05-14 DIAGNOSIS — N390 Urinary tract infection, site not specified: Secondary | ICD-10-CM | POA: Diagnosis not present

## 2015-05-14 LAB — POCT INR: INR: 3.6

## 2015-05-15 ENCOUNTER — Other Ambulatory Visit: Payer: Medicare Other

## 2015-05-15 DIAGNOSIS — J189 Pneumonia, unspecified organism: Secondary | ICD-10-CM | POA: Diagnosis not present

## 2015-05-15 DIAGNOSIS — I5032 Chronic diastolic (congestive) heart failure: Secondary | ICD-10-CM | POA: Diagnosis not present

## 2015-05-15 DIAGNOSIS — I4891 Unspecified atrial fibrillation: Secondary | ICD-10-CM | POA: Diagnosis not present

## 2015-05-15 DIAGNOSIS — M109 Gout, unspecified: Secondary | ICD-10-CM | POA: Diagnosis not present

## 2015-05-15 DIAGNOSIS — N390 Urinary tract infection, site not specified: Secondary | ICD-10-CM | POA: Diagnosis not present

## 2015-05-15 DIAGNOSIS — F0390 Unspecified dementia without behavioral disturbance: Secondary | ICD-10-CM | POA: Diagnosis not present

## 2015-05-16 ENCOUNTER — Ambulatory Visit
Admission: RE | Admit: 2015-05-16 | Discharge: 2015-05-16 | Disposition: A | Discharge status: Home / Self Care | Payer: Medicare Other | Source: Ambulatory Visit | Attending: Internal Medicine | Admitting: Internal Medicine

## 2015-05-16 DIAGNOSIS — E041 Nontoxic single thyroid nodule: Secondary | ICD-10-CM | POA: Diagnosis not present

## 2015-05-16 DIAGNOSIS — M109 Gout, unspecified: Secondary | ICD-10-CM | POA: Diagnosis not present

## 2015-05-16 DIAGNOSIS — E0590 Thyrotoxicosis, unspecified without thyrotoxic crisis or storm: Secondary | ICD-10-CM

## 2015-05-16 DIAGNOSIS — J189 Pneumonia, unspecified organism: Secondary | ICD-10-CM | POA: Diagnosis not present

## 2015-05-16 DIAGNOSIS — F0390 Unspecified dementia without behavioral disturbance: Secondary | ICD-10-CM | POA: Diagnosis not present

## 2015-05-16 DIAGNOSIS — I5032 Chronic diastolic (congestive) heart failure: Secondary | ICD-10-CM | POA: Diagnosis not present

## 2015-05-16 DIAGNOSIS — N390 Urinary tract infection, site not specified: Secondary | ICD-10-CM | POA: Diagnosis not present

## 2015-05-16 DIAGNOSIS — I4891 Unspecified atrial fibrillation: Secondary | ICD-10-CM | POA: Diagnosis not present

## 2015-05-17 DIAGNOSIS — I5032 Chronic diastolic (congestive) heart failure: Secondary | ICD-10-CM | POA: Diagnosis not present

## 2015-05-17 DIAGNOSIS — I4891 Unspecified atrial fibrillation: Secondary | ICD-10-CM | POA: Diagnosis not present

## 2015-05-17 DIAGNOSIS — J189 Pneumonia, unspecified organism: Secondary | ICD-10-CM | POA: Diagnosis not present

## 2015-05-17 DIAGNOSIS — N390 Urinary tract infection, site not specified: Secondary | ICD-10-CM | POA: Diagnosis not present

## 2015-05-17 DIAGNOSIS — F0390 Unspecified dementia without behavioral disturbance: Secondary | ICD-10-CM | POA: Diagnosis not present

## 2015-05-17 DIAGNOSIS — M109 Gout, unspecified: Secondary | ICD-10-CM | POA: Diagnosis not present

## 2015-05-21 ENCOUNTER — Ambulatory Visit (INDEPENDENT_AMBULATORY_CARE_PROVIDER_SITE_OTHER): Payer: Medicare Other | Admitting: Pharmacist

## 2015-05-21 DIAGNOSIS — M109 Gout, unspecified: Secondary | ICD-10-CM | POA: Diagnosis not present

## 2015-05-21 DIAGNOSIS — Z95810 Presence of automatic (implantable) cardiac defibrillator: Secondary | ICD-10-CM

## 2015-05-21 DIAGNOSIS — N390 Urinary tract infection, site not specified: Secondary | ICD-10-CM | POA: Diagnosis not present

## 2015-05-21 DIAGNOSIS — Z5181 Encounter for therapeutic drug level monitoring: Secondary | ICD-10-CM

## 2015-05-21 DIAGNOSIS — J189 Pneumonia, unspecified organism: Secondary | ICD-10-CM | POA: Diagnosis not present

## 2015-05-21 DIAGNOSIS — I5032 Chronic diastolic (congestive) heart failure: Secondary | ICD-10-CM | POA: Diagnosis not present

## 2015-05-21 DIAGNOSIS — I482 Chronic atrial fibrillation, unspecified: Secondary | ICD-10-CM

## 2015-05-21 DIAGNOSIS — I4891 Unspecified atrial fibrillation: Secondary | ICD-10-CM | POA: Diagnosis not present

## 2015-05-21 DIAGNOSIS — F0390 Unspecified dementia without behavioral disturbance: Secondary | ICD-10-CM | POA: Diagnosis not present

## 2015-05-21 LAB — POCT INR: INR: 1.8

## 2015-05-22 DIAGNOSIS — J189 Pneumonia, unspecified organism: Secondary | ICD-10-CM | POA: Diagnosis not present

## 2015-05-22 DIAGNOSIS — I5032 Chronic diastolic (congestive) heart failure: Secondary | ICD-10-CM | POA: Diagnosis not present

## 2015-05-22 DIAGNOSIS — F0390 Unspecified dementia without behavioral disturbance: Secondary | ICD-10-CM | POA: Diagnosis not present

## 2015-05-22 DIAGNOSIS — M109 Gout, unspecified: Secondary | ICD-10-CM | POA: Diagnosis not present

## 2015-05-22 DIAGNOSIS — N390 Urinary tract infection, site not specified: Secondary | ICD-10-CM | POA: Diagnosis not present

## 2015-05-22 DIAGNOSIS — I4891 Unspecified atrial fibrillation: Secondary | ICD-10-CM | POA: Diagnosis not present

## 2015-05-23 ENCOUNTER — Other Ambulatory Visit (HOSPITAL_COMMUNITY): Payer: Self-pay | Admitting: Internal Medicine

## 2015-05-23 DIAGNOSIS — R1310 Dysphagia, unspecified: Secondary | ICD-10-CM

## 2015-05-23 DIAGNOSIS — I5032 Chronic diastolic (congestive) heart failure: Secondary | ICD-10-CM | POA: Diagnosis not present

## 2015-05-23 DIAGNOSIS — F0390 Unspecified dementia without behavioral disturbance: Secondary | ICD-10-CM | POA: Diagnosis not present

## 2015-05-23 DIAGNOSIS — I4891 Unspecified atrial fibrillation: Secondary | ICD-10-CM | POA: Diagnosis not present

## 2015-05-23 DIAGNOSIS — N390 Urinary tract infection, site not specified: Secondary | ICD-10-CM | POA: Diagnosis not present

## 2015-05-23 DIAGNOSIS — J189 Pneumonia, unspecified organism: Secondary | ICD-10-CM | POA: Diagnosis not present

## 2015-05-23 DIAGNOSIS — M109 Gout, unspecified: Secondary | ICD-10-CM | POA: Diagnosis not present

## 2015-05-24 DIAGNOSIS — F0390 Unspecified dementia without behavioral disturbance: Secondary | ICD-10-CM | POA: Diagnosis not present

## 2015-05-24 DIAGNOSIS — J189 Pneumonia, unspecified organism: Secondary | ICD-10-CM | POA: Diagnosis not present

## 2015-05-24 DIAGNOSIS — M109 Gout, unspecified: Secondary | ICD-10-CM | POA: Diagnosis not present

## 2015-05-24 DIAGNOSIS — N390 Urinary tract infection, site not specified: Secondary | ICD-10-CM | POA: Diagnosis not present

## 2015-05-24 DIAGNOSIS — I5032 Chronic diastolic (congestive) heart failure: Secondary | ICD-10-CM | POA: Diagnosis not present

## 2015-05-24 DIAGNOSIS — I4891 Unspecified atrial fibrillation: Secondary | ICD-10-CM | POA: Diagnosis not present

## 2015-05-25 DIAGNOSIS — F0390 Unspecified dementia without behavioral disturbance: Secondary | ICD-10-CM | POA: Diagnosis not present

## 2015-05-25 DIAGNOSIS — J189 Pneumonia, unspecified organism: Secondary | ICD-10-CM | POA: Diagnosis not present

## 2015-05-25 DIAGNOSIS — N390 Urinary tract infection, site not specified: Secondary | ICD-10-CM | POA: Diagnosis not present

## 2015-05-25 DIAGNOSIS — M109 Gout, unspecified: Secondary | ICD-10-CM | POA: Diagnosis not present

## 2015-05-25 DIAGNOSIS — I4891 Unspecified atrial fibrillation: Secondary | ICD-10-CM | POA: Diagnosis not present

## 2015-05-25 DIAGNOSIS — I5032 Chronic diastolic (congestive) heart failure: Secondary | ICD-10-CM | POA: Diagnosis not present

## 2015-05-28 ENCOUNTER — Ambulatory Visit (INDEPENDENT_AMBULATORY_CARE_PROVIDER_SITE_OTHER): Payer: Medicare Other | Admitting: Cardiology

## 2015-05-28 DIAGNOSIS — Z5181 Encounter for therapeutic drug level monitoring: Secondary | ICD-10-CM

## 2015-05-28 DIAGNOSIS — I482 Chronic atrial fibrillation, unspecified: Secondary | ICD-10-CM

## 2015-05-28 DIAGNOSIS — N390 Urinary tract infection, site not specified: Secondary | ICD-10-CM | POA: Diagnosis not present

## 2015-05-28 DIAGNOSIS — I4891 Unspecified atrial fibrillation: Secondary | ICD-10-CM | POA: Diagnosis not present

## 2015-05-28 DIAGNOSIS — I5032 Chronic diastolic (congestive) heart failure: Secondary | ICD-10-CM | POA: Diagnosis not present

## 2015-05-28 DIAGNOSIS — Z95810 Presence of automatic (implantable) cardiac defibrillator: Secondary | ICD-10-CM

## 2015-05-28 DIAGNOSIS — F0390 Unspecified dementia without behavioral disturbance: Secondary | ICD-10-CM | POA: Diagnosis not present

## 2015-05-28 DIAGNOSIS — M109 Gout, unspecified: Secondary | ICD-10-CM | POA: Diagnosis not present

## 2015-05-28 DIAGNOSIS — J189 Pneumonia, unspecified organism: Secondary | ICD-10-CM | POA: Diagnosis not present

## 2015-05-28 LAB — POCT INR: INR: 2.1

## 2015-05-29 DIAGNOSIS — M109 Gout, unspecified: Secondary | ICD-10-CM | POA: Diagnosis not present

## 2015-05-29 DIAGNOSIS — F0390 Unspecified dementia without behavioral disturbance: Secondary | ICD-10-CM | POA: Diagnosis not present

## 2015-05-29 DIAGNOSIS — N390 Urinary tract infection, site not specified: Secondary | ICD-10-CM | POA: Diagnosis not present

## 2015-05-29 DIAGNOSIS — I5032 Chronic diastolic (congestive) heart failure: Secondary | ICD-10-CM | POA: Diagnosis not present

## 2015-05-29 DIAGNOSIS — J189 Pneumonia, unspecified organism: Secondary | ICD-10-CM | POA: Diagnosis not present

## 2015-05-29 DIAGNOSIS — I4891 Unspecified atrial fibrillation: Secondary | ICD-10-CM | POA: Diagnosis not present

## 2015-05-30 ENCOUNTER — Ambulatory Visit (HOSPITAL_COMMUNITY)
Admission: RE | Admit: 2015-05-30 | Discharge: 2015-05-30 | Disposition: A | Discharge status: Home / Self Care | Payer: Medicare Other | Source: Ambulatory Visit | Attending: Internal Medicine | Admitting: Internal Medicine

## 2015-05-30 DIAGNOSIS — R1310 Dysphagia, unspecified: Secondary | ICD-10-CM

## 2015-05-30 DIAGNOSIS — R946 Abnormal results of thyroid function studies: Secondary | ICD-10-CM | POA: Diagnosis not present

## 2015-05-30 DIAGNOSIS — E0590 Thyrotoxicosis, unspecified without thyrotoxic crisis or storm: Secondary | ICD-10-CM | POA: Insufficient documentation

## 2015-05-30 NOTE — Progress Notes (Signed)
MBSS complete. Full report located under chart review in imaging section.  Adylee Leonardo Paiewonsky, M.A. CCC-SLP [phone]  

## 2015-05-31 DIAGNOSIS — I4891 Unspecified atrial fibrillation: Secondary | ICD-10-CM | POA: Diagnosis not present

## 2015-05-31 DIAGNOSIS — M109 Gout, unspecified: Secondary | ICD-10-CM | POA: Diagnosis not present

## 2015-05-31 DIAGNOSIS — N390 Urinary tract infection, site not specified: Secondary | ICD-10-CM | POA: Diagnosis not present

## 2015-05-31 DIAGNOSIS — J189 Pneumonia, unspecified organism: Secondary | ICD-10-CM | POA: Diagnosis not present

## 2015-05-31 DIAGNOSIS — I5032 Chronic diastolic (congestive) heart failure: Secondary | ICD-10-CM | POA: Diagnosis not present

## 2015-05-31 DIAGNOSIS — F0390 Unspecified dementia without behavioral disturbance: Secondary | ICD-10-CM | POA: Diagnosis not present

## 2015-06-05 ENCOUNTER — Ambulatory Visit (INDEPENDENT_AMBULATORY_CARE_PROVIDER_SITE_OTHER): Payer: Medicare Other | Admitting: *Deleted

## 2015-06-05 DIAGNOSIS — N390 Urinary tract infection, site not specified: Secondary | ICD-10-CM | POA: Diagnosis not present

## 2015-06-05 DIAGNOSIS — I5032 Chronic diastolic (congestive) heart failure: Secondary | ICD-10-CM | POA: Diagnosis not present

## 2015-06-05 DIAGNOSIS — M109 Gout, unspecified: Secondary | ICD-10-CM | POA: Diagnosis not present

## 2015-06-05 DIAGNOSIS — I5022 Chronic systolic (congestive) heart failure: Secondary | ICD-10-CM | POA: Diagnosis not present

## 2015-06-05 DIAGNOSIS — Z95810 Presence of automatic (implantable) cardiac defibrillator: Secondary | ICD-10-CM

## 2015-06-05 DIAGNOSIS — J189 Pneumonia, unspecified organism: Secondary | ICD-10-CM | POA: Diagnosis not present

## 2015-06-05 DIAGNOSIS — F0390 Unspecified dementia without behavioral disturbance: Secondary | ICD-10-CM | POA: Diagnosis not present

## 2015-06-05 DIAGNOSIS — I429 Cardiomyopathy, unspecified: Secondary | ICD-10-CM

## 2015-06-05 DIAGNOSIS — I4891 Unspecified atrial fibrillation: Secondary | ICD-10-CM | POA: Diagnosis not present

## 2015-06-06 NOTE — Progress Notes (Signed)
Remote ICD transmission.   

## 2015-06-07 DIAGNOSIS — F0390 Unspecified dementia without behavioral disturbance: Secondary | ICD-10-CM | POA: Diagnosis not present

## 2015-06-07 DIAGNOSIS — M109 Gout, unspecified: Secondary | ICD-10-CM | POA: Diagnosis not present

## 2015-06-07 DIAGNOSIS — I4891 Unspecified atrial fibrillation: Secondary | ICD-10-CM | POA: Diagnosis not present

## 2015-06-07 DIAGNOSIS — I5032 Chronic diastolic (congestive) heart failure: Secondary | ICD-10-CM | POA: Diagnosis not present

## 2015-06-07 DIAGNOSIS — J189 Pneumonia, unspecified organism: Secondary | ICD-10-CM | POA: Diagnosis not present

## 2015-06-07 DIAGNOSIS — N390 Urinary tract infection, site not specified: Secondary | ICD-10-CM | POA: Diagnosis not present

## 2015-06-11 ENCOUNTER — Ambulatory Visit (INDEPENDENT_AMBULATORY_CARE_PROVIDER_SITE_OTHER): Payer: Medicare Other | Admitting: *Deleted

## 2015-06-11 DIAGNOSIS — I4891 Unspecified atrial fibrillation: Secondary | ICD-10-CM | POA: Diagnosis not present

## 2015-06-11 DIAGNOSIS — Z5181 Encounter for therapeutic drug level monitoring: Secondary | ICD-10-CM

## 2015-06-11 DIAGNOSIS — Z95810 Presence of automatic (implantable) cardiac defibrillator: Secondary | ICD-10-CM | POA: Diagnosis not present

## 2015-06-11 DIAGNOSIS — I482 Chronic atrial fibrillation, unspecified: Secondary | ICD-10-CM

## 2015-06-11 DIAGNOSIS — Z7901 Long term (current) use of anticoagulants: Secondary | ICD-10-CM | POA: Diagnosis not present

## 2015-06-11 LAB — POCT INR: INR: 1.5

## 2015-06-13 ENCOUNTER — Ambulatory Visit (HOSPITAL_COMMUNITY): Payer: Medicare Other

## 2015-06-13 ENCOUNTER — Other Ambulatory Visit (HOSPITAL_COMMUNITY): Payer: Self-pay

## 2015-06-19 LAB — CUP PACEART REMOTE DEVICE CHECK
Brady Statistic AP VP Percent: 70.08 %
Brady Statistic AS VP Percent: 27.36 %
Brady Statistic RA Percent Paced: 70.94 %
Brady Statistic RV Percent Paced: 88.06 %
Date Time Interrogation Session: 20170612192459
HighPow Impedance: 43 Ohm
HighPow Impedance: 53 Ohm
Implantable Lead Location: 753859
Implantable Lead Model: 5076
Lead Channel Impedance Value: 342 Ohm
Lead Channel Impedance Value: 608 Ohm
Lead Channel Pacing Threshold Amplitude: 0.875 V
Lead Channel Pacing Threshold Pulse Width: 0.4 ms
Lead Channel Sensing Intrinsic Amplitude: 0.5 mV
Lead Channel Sensing Intrinsic Amplitude: 20 mV
Lead Channel Setting Pacing Amplitude: 2 V
Lead Channel Setting Pacing Pulse Width: 0.4 ms
Lead Channel Setting Sensing Sensitivity: 0.6 mV
MDC IDC LEAD IMPLANT DT: 20090326
MDC IDC LEAD IMPLANT DT: 20090326
MDC IDC LEAD IMPLANT DT: 20090326
MDC IDC LEAD LOCATION: 753858
MDC IDC LEAD LOCATION: 753860
MDC IDC LEAD MODEL: 4194
MDC IDC MSMT BATTERY REMAINING LONGEVITY: 60 mo
MDC IDC MSMT BATTERY VOLTAGE: 2.98 V
MDC IDC MSMT LEADCHNL LV IMPEDANCE VALUE: 456 Ohm
MDC IDC MSMT LEADCHNL LV PACING THRESHOLD AMPLITUDE: 0.875 V
MDC IDC MSMT LEADCHNL LV PACING THRESHOLD PULSEWIDTH: 0.4 ms
MDC IDC MSMT LEADCHNL RA IMPEDANCE VALUE: 418 Ohm
MDC IDC MSMT LEADCHNL RA SENSING INTR AMPL: 0.5 mV
MDC IDC MSMT LEADCHNL RV IMPEDANCE VALUE: 342 Ohm
MDC IDC MSMT LEADCHNL RV IMPEDANCE VALUE: 418 Ohm
MDC IDC MSMT LEADCHNL RV PACING THRESHOLD AMPLITUDE: 0.625 V
MDC IDC MSMT LEADCHNL RV PACING THRESHOLD PULSEWIDTH: 0.4 ms
MDC IDC MSMT LEADCHNL RV SENSING INTR AMPL: 20 mV
MDC IDC SET LEADCHNL LV PACING AMPLITUDE: 2 V
MDC IDC SET LEADCHNL RV PACING AMPLITUDE: 2.5 V
MDC IDC SET LEADCHNL RV PACING PULSEWIDTH: 0.4 ms
MDC IDC STAT BRADY AP VS PERCENT: 0.86 %
MDC IDC STAT BRADY AS VS PERCENT: 1.7 %

## 2015-06-21 ENCOUNTER — Ambulatory Visit (INDEPENDENT_AMBULATORY_CARE_PROVIDER_SITE_OTHER): Payer: Medicare Other | Admitting: *Deleted

## 2015-06-21 ENCOUNTER — Ambulatory Visit (HOSPITAL_COMMUNITY)
Admission: RE | Admit: 2015-06-21 | Discharge: 2015-06-21 | Disposition: A | Discharge status: Home / Self Care | Payer: Medicare Other | Source: Ambulatory Visit | Attending: Internal Medicine | Admitting: Internal Medicine

## 2015-06-21 VITALS — BP 104/58 | HR 75 | Wt 161.5 lb

## 2015-06-21 DIAGNOSIS — I480 Paroxysmal atrial fibrillation: Secondary | ICD-10-CM | POA: Insufficient documentation

## 2015-06-21 DIAGNOSIS — R413 Other amnesia: Secondary | ICD-10-CM | POA: Insufficient documentation

## 2015-06-21 DIAGNOSIS — Z79899 Other long term (current) drug therapy: Secondary | ICD-10-CM | POA: Insufficient documentation

## 2015-06-21 DIAGNOSIS — Z5181 Encounter for therapeutic drug level monitoring: Secondary | ICD-10-CM

## 2015-06-21 DIAGNOSIS — Z7982 Long term (current) use of aspirin: Secondary | ICD-10-CM | POA: Diagnosis not present

## 2015-06-21 DIAGNOSIS — N183 Chronic kidney disease, stage 3 (moderate): Secondary | ICD-10-CM | POA: Diagnosis not present

## 2015-06-21 DIAGNOSIS — Z7901 Long term (current) use of anticoagulants: Secondary | ICD-10-CM

## 2015-06-21 DIAGNOSIS — I4891 Unspecified atrial fibrillation: Secondary | ICD-10-CM

## 2015-06-21 DIAGNOSIS — Z95810 Presence of automatic (implantable) cardiac defibrillator: Secondary | ICD-10-CM | POA: Insufficient documentation

## 2015-06-21 DIAGNOSIS — I482 Chronic atrial fibrillation, unspecified: Secondary | ICD-10-CM

## 2015-06-21 DIAGNOSIS — I429 Cardiomyopathy, unspecified: Secondary | ICD-10-CM | POA: Diagnosis not present

## 2015-06-21 DIAGNOSIS — I5022 Chronic systolic (congestive) heart failure: Secondary | ICD-10-CM | POA: Diagnosis not present

## 2015-06-21 DIAGNOSIS — I447 Left bundle-branch block, unspecified: Secondary | ICD-10-CM | POA: Insufficient documentation

## 2015-06-21 LAB — POCT INR: INR: 1.6

## 2015-06-21 MED ORDER — FUROSEMIDE 40 MG PO TABS: 40.0000 mg | ORAL_TABLET | ORAL | Status: DC

## 2015-06-21 NOTE — Addendum Note (Signed)
Encounter addended by: Scarlette Calico, RN on: 06/21/2015  3:16 PM<BR>     Documentation filed: Patient Instructions Section, Dx Association, Orders

## 2015-06-21 NOTE — Progress Notes (Signed)
Patient ID: Jon Butler, male   DOB: [birth year], 80 y.o.   MRN: [id number] Patient ID: Jon Butler, male   DOB: 26-Dec-1935, 80 y.o.   MRN: [id number]   Advanced Heart Failure Note   Patient ID: Jon Butler, male   DOB: 17-Dec-1935, 80 y.o.   MRN: [id number] PCP: Jon Jon Butler EP: [facility] HF: Jon Butler  HPI: Jon Butler is a 80 yo male with a history of chronic systolic HF EF [id number] due to NICM s/p CRT-D (Medtronic), mechanical AVR (St Jude), LBBB, PAF S/P DC-CV 12/20/12, ETOH abuse and dementia and non-compliance.   Admitted to [facility] 10/10 through 10/26/12 with ADHF in setting of recurrent AF. Developed cardiogenic shock and VDRF   BB and ACE-I held and amiodarone gtt was started. Required short term Milrinone and lasix.Diuresed 26 pounds and transitioned to 40 mg daily. DC-CV on 10/20. Remains on Coumadin. D/C weight 206 pounds.   S/P successful DC-CV 12/20/12. Amio increased to 400 bid.   Has seen Jon. Sherron Butler in Neurology for progressive cognitive decline in setting of ETOH abuse. Also had foot drop.   Admitted in 8/16 for respiratory failure due to PNA and HF in setting of recurrent AF. Underwent TEE/DC-CV with nearly immediate reversion to AF.Seen by EP and felt to have end-stage HF and rate control strategy recommended. Amio stopped. Also underwent botox injection in esophagus for esophageal spasm/dysphagia. Made DNR and ICD deactivated. Weight at home 193-194 (was discharged at 191 pound)   Saw Jon. Lovena Butler in November 2016 and was in AFL paced back into NSR.  Admitted in March 2017 with pneumonia and possible sepsis. Treated abx.   Returns for Heart Failure follow up.  Says he is feeling good. Weight down about 10 pounds at home. Not taking lasix. No swelling, edema, orthopnea or PND. Doesn't do much at home. Uses cane when walking. No longer drinking ETOH. Confusion at times.   Echo 3/17 25-30%   11/01/12 Labs INR > 8 10/26/12 K 3.0 Creatinine 1.23  12/16/12 K 4.6  Creatinine 1.05 12/20/12 K 4.0 Creatinine 1.04 11/15      K 4.4 Creatinine 1.1 5/16: K 4.3 Creatinine 0.94  6/16: INR 1.9  09/07/14: K 4.0, Cr 1.00 01/05/15 K 4.7 creatinine 0.9  ICD interrogation : Optivol stable above threshold. Back in NSR. No VT. Activity levelincreased slightly ~ 1hour per day. 100% biv pacing  ReDS = 37%   ROS: All systems negative except as listed in HPI, PMH and Problem List.  Past Medical History  Diagnosis Date  . Left bundle-branch block   . Cardiomyopathy   . Chronic systolic heart failure ([place])   . Atrial fibrillation ([place])   . Dementia   . Pneumonia 08/2014    Current Outpatient Prescriptions  Medication Sig Dispense Refill  . allopurinol (ZYLOPRIM) 300 MG tablet Take 300 mg by mouth daily.     Marland Kitchen amiodarone (PACERONE) 200 MG tablet Take 1 tablet (200 mg total) by mouth daily. 30 tablet 3  . amoxicillin (AMOXIL) 500 MG capsule Take 500 mg by mouth 3 (three) times a week.    Marland Kitchen aspirin EC 81 MG tablet Take 1 tablet (81 mg total) by mouth daily.    . carvedilol (COREG) 3.125 MG tablet Take 3.125 mg by mouth 2 (two) times daily with a meal.    . colchicine 0.6 MG tablet Take 0.6 mg by mouth daily as needed (gout pain). Reported on 12/21/2014    . donepezil (ARICEPT) 23  MG TABS tablet Take 23 mg by mouth 2 (two) times daily.    . fluticasone (FLONASE) 50 MCG/ACT nasal spray Place 2 sprays into both nostrils daily as needed for allergies or rhinitis. Reported on 12/21/2014    . lisinopril (PRINIVIL,ZESTRIL) 10 MG tablet Take 0.5 tablets (5 mg total) by mouth 2 (two) times daily. 30 tablet 4  . LORazepam (ATIVAN) 0.5 MG tablet Take 0.5 mg by mouth at bedtime.    . memantine (NAMENDA XR) 28 MG CP24 24 hr capsule Take 1 capsule (28 mg total) by mouth daily. 30 capsule 6  . rosuvastatin (CRESTOR) 10 MG tablet Take 5 mg by mouth daily. Reported on 03/22/2015    . spironolactone (ALDACTONE) 25 MG tablet Take 0.5 tablets (12.5 mg total) by mouth daily. 30 tablet 3    . thiamine (VITAMIN B-1) 100 MG tablet Take 1 tablet (100 mg total) by mouth 2 (two) times daily. 60 tablet 6  . traZODone (DESYREL) 100 MG tablet Take 450 mg by mouth at bedtime.   2  . warfarin (COUMADIN) 2 MG tablet Take 1-1.5 tablets (2-3 mg total) by mouth daily at 6 PM. Take 2 mg (1 tablet) daily except on Sunday, Tuesday, and Friday. Take 3 mg (1.5 tablets) on Sunday, Tuesday, and Friday    . metolazone (ZAROXOLYN) 2.5 MG tablet Take 1 tablet (2.5 mg total) by mouth daily. As needed for weight gain (Patient not taking: Reported on 06/21/2015) 30 tablet 3   No current facility-administered medications for this encounter.   Filed Vitals:   06/21/15 1417  BP: 104/58  Pulse: 75  Weight: 161 lb 8 oz (73.256 kg)  SpO2: 97%     PHYSICAL EXAM: General:  Elderly. No resp difficulty Wife present HEENT: normal Neck: supple. JVP 5-6 cm.  Carotids 2+ bilaterally; no bruits. No lymphadenopathy or thryomegaly appreciated. Cor: PMI normal. RRR. Mechanical s2 No rubs, gallops 2/6 TR. Lungs: clear  Abdomen: soft, nontender,no distention. No HSM. No bruits or masses. Good bowel sounds. Extremities: no cyanosis, clubbing, rash, no edema Neuro: alert & orientedx3, cranial nerves grossly intact. Moves all 4 extremities w/o difficulty. Affect normal  ASSESSMENT & PLAN: 1. Chronic Systolic Heart Failure TEE with EF 15-20%  Up to 25-30% in 3/17  S/P Medtronic CRT-D 09/2012  - Volume status looks good on exam but mildly elevated on Optivol and REDS. Start lasix 40mg  on Monday and Friday  - Stable NYHA III symptoms - Continue current meds  2. A Fib-  S/P DC-CV 12/20/12 but reverted to AF. Had AFL in 11/1 and paced out.  - Remains in NSR today on exam and by ICD interrogation.  Continue amio at 200 daily.  - Continue coumadin for AF and AVR.  3. CKD stage 3- stable on recent labs  4. Alcohol- Reinforced alcohol abstinence especially with coumadin.  5. Memory loss-  Neurology following. On namenda and  aricept. Seems better today.  6. Mechanical AVR - on coumadin. SBE prophylaxis for all procedures.    Glori Bickers, MD  2:57 PM

## 2015-06-21 NOTE — Progress Notes (Signed)
Advanced Heart Failure Medication Review by a Pharmacist  Does the patient  feel that his/her medications are working for him/her?  yes  Has the patient been experiencing any side effects to the medications prescribed?  no  Does the patient measure his/her own blood pressure or blood glucose at home?  yes   Does the patient have any problems obtaining medications due to transportation or finances?   no  Understanding of regimen: poor, dementia Understanding of indications: poor, dementia Potential of compliance: good Patient understands to avoid NSAIDs. Patient understands to avoid decongestants   Pharmacist comments: Mr. Westland is a pleasant 59 yom presenting to clinic with his wife for a follow-up appointment. He states that he is not experiencing any medication-related side effects and rarely misses any doses of his medications. He denies any swelling, dizziness, or SOB. He did not have any medication-related questions at this visit.   Zhi Geier C. Lennox Grumbles, PharmD Pharmacy Resident  Pager: [phone] 06/21/2015 2:46 PM   Time with patient: 15 min  Preparation and documentation time: 3 min  Total time: 18 min

## 2015-06-21 NOTE — Patient Instructions (Signed)
Start Furosemide (lasix) 40 mg every Monday and Friday  Labs in 1 month:  Friday July 14th any time between 9-12  Your physician recommends that you schedule a follow-up appointment in: 3 months

## 2015-06-26 ENCOUNTER — Encounter: Payer: Self-pay | Admitting: Cardiology

## 2015-07-03 ENCOUNTER — Encounter: Payer: Self-pay | Admitting: Internal Medicine

## 2015-07-05 ENCOUNTER — Ambulatory Visit (INDEPENDENT_AMBULATORY_CARE_PROVIDER_SITE_OTHER): Payer: Medicare Other | Admitting: *Deleted

## 2015-07-05 DIAGNOSIS — Z5181 Encounter for therapeutic drug level monitoring: Secondary | ICD-10-CM

## 2015-07-05 DIAGNOSIS — Z7901 Long term (current) use of anticoagulants: Secondary | ICD-10-CM | POA: Diagnosis not present

## 2015-07-05 DIAGNOSIS — Z95810 Presence of automatic (implantable) cardiac defibrillator: Secondary | ICD-10-CM | POA: Diagnosis not present

## 2015-07-05 DIAGNOSIS — I4891 Unspecified atrial fibrillation: Secondary | ICD-10-CM | POA: Diagnosis not present

## 2015-07-05 DIAGNOSIS — I480 Paroxysmal atrial fibrillation: Secondary | ICD-10-CM

## 2015-07-05 LAB — POCT INR: INR: 1.7

## 2015-07-19 ENCOUNTER — Ambulatory Visit (INDEPENDENT_AMBULATORY_CARE_PROVIDER_SITE_OTHER): Payer: Medicare Other | Admitting: *Deleted

## 2015-07-19 DIAGNOSIS — Z95810 Presence of automatic (implantable) cardiac defibrillator: Secondary | ICD-10-CM | POA: Diagnosis not present

## 2015-07-19 DIAGNOSIS — I4891 Unspecified atrial fibrillation: Secondary | ICD-10-CM | POA: Diagnosis not present

## 2015-07-19 DIAGNOSIS — Z7901 Long term (current) use of anticoagulants: Secondary | ICD-10-CM | POA: Diagnosis not present

## 2015-07-19 DIAGNOSIS — Z5181 Encounter for therapeutic drug level monitoring: Secondary | ICD-10-CM

## 2015-07-19 DIAGNOSIS — I480 Paroxysmal atrial fibrillation: Secondary | ICD-10-CM

## 2015-07-19 LAB — POCT INR: INR: 1.8

## 2015-07-20 ENCOUNTER — Other Ambulatory Visit (HOSPITAL_COMMUNITY): Payer: Medicare Other

## 2015-07-23 ENCOUNTER — Telehealth: Payer: Self-pay | Admitting: Pharmacist

## 2015-07-23 NOTE — Telephone Encounter (Signed)
Pt's wife called to report that pt just fell and hit his head. She reported that she could see some blood in each of his eyes. Strongly advised pt to go to ER for head scan and INR check since he takes Coumadin. Pt's wife verbalized understanding.

## 2015-07-27 ENCOUNTER — Ambulatory Visit (HOSPITAL_COMMUNITY)
Admission: RE | Admit: 2015-07-27 | Discharge: 2015-07-27 | Disposition: A | Discharge status: Home / Self Care | Payer: Medicare Other | Source: Ambulatory Visit | Attending: Cardiology | Admitting: Cardiology

## 2015-07-27 DIAGNOSIS — I5023 Acute on chronic systolic (congestive) heart failure: Secondary | ICD-10-CM

## 2015-07-27 DIAGNOSIS — I5022 Chronic systolic (congestive) heart failure: Secondary | ICD-10-CM | POA: Diagnosis not present

## 2015-07-27 LAB — COMPREHENSIVE METABOLIC PANEL
ALBUMIN: 3.6 g/dL (ref 3.5–5.0)
ALK PHOS: 93 U/L (ref 38–126)
ALT: 17 U/L (ref 17–63)
AST: 24 U/L (ref 15–41)
Anion gap: 6 (ref 5–15)
BUN: 14 mg/dL (ref 6–20)
CALCIUM: 8.6 mg/dL — AB (ref 8.9–10.3)
CO2: 26 mmol/L (ref 22–32)
Chloride: 104 mmol/L (ref 101–111)
Creatinine, Ser: 0.98 mg/dL (ref 0.61–1.24)
GFR calc non Af Amer: >60 mL/min (ref >60)
Glucose, Bld: 98 mg/dL (ref 65–99)
Potassium: 5.2 mmol/L — ABNORMAL HIGH (ref 3.5–5.1)
SODIUM: 136 mmol/L (ref 135–145)
TOTAL PROTEIN: 6 g/dL — AB (ref 6.5–8.1)
Total Bilirubin: 0.7 mg/dL (ref 0.3–1.2)

## 2015-07-27 LAB — TSH: TSH: 3.587 u[IU]/mL (ref 0.350–4.500)

## 2015-07-27 LAB — T4, FREE: Free T4: 1.18 ng/dL — ABNORMAL HIGH (ref 0.61–1.12)

## 2015-07-30 ENCOUNTER — Ambulatory Visit (INDEPENDENT_AMBULATORY_CARE_PROVIDER_SITE_OTHER): Payer: Medicare Other | Admitting: *Deleted

## 2015-07-30 DIAGNOSIS — I4891 Unspecified atrial fibrillation: Secondary | ICD-10-CM | POA: Diagnosis not present

## 2015-07-30 DIAGNOSIS — Z7901 Long term (current) use of anticoagulants: Secondary | ICD-10-CM

## 2015-07-30 DIAGNOSIS — Z5181 Encounter for therapeutic drug level monitoring: Secondary | ICD-10-CM

## 2015-07-30 DIAGNOSIS — Z95810 Presence of automatic (implantable) cardiac defibrillator: Secondary | ICD-10-CM

## 2015-07-30 LAB — POCT INR: INR: 1.9

## 2015-08-06 ENCOUNTER — Telehealth (HOSPITAL_COMMUNITY): Payer: Self-pay | Admitting: *Deleted

## 2015-08-06 NOTE — Telephone Encounter (Signed)
Pts wife called to go over pts thyroid labs. She is aware and understands results.

## 2015-08-09 ENCOUNTER — Ambulatory Visit (INDEPENDENT_AMBULATORY_CARE_PROVIDER_SITE_OTHER): Payer: Medicare Other

## 2015-08-09 DIAGNOSIS — Z5181 Encounter for therapeutic drug level monitoring: Secondary | ICD-10-CM

## 2015-08-09 DIAGNOSIS — I4891 Unspecified atrial fibrillation: Secondary | ICD-10-CM

## 2015-08-09 DIAGNOSIS — Z7901 Long term (current) use of anticoagulants: Secondary | ICD-10-CM

## 2015-08-09 DIAGNOSIS — Z95810 Presence of automatic (implantable) cardiac defibrillator: Secondary | ICD-10-CM | POA: Diagnosis not present

## 2015-08-09 LAB — POCT INR: INR: 2.9

## 2015-08-23 ENCOUNTER — Ambulatory Visit (INDEPENDENT_AMBULATORY_CARE_PROVIDER_SITE_OTHER): Payer: Medicare Other | Admitting: Pharmacist

## 2015-08-23 ENCOUNTER — Encounter (INDEPENDENT_AMBULATORY_CARE_PROVIDER_SITE_OTHER): Payer: Self-pay

## 2015-08-23 DIAGNOSIS — I4891 Unspecified atrial fibrillation: Secondary | ICD-10-CM | POA: Diagnosis not present

## 2015-08-23 DIAGNOSIS — Z7901 Long term (current) use of anticoagulants: Secondary | ICD-10-CM

## 2015-08-23 DIAGNOSIS — Z95810 Presence of automatic (implantable) cardiac defibrillator: Secondary | ICD-10-CM | POA: Diagnosis not present

## 2015-08-23 DIAGNOSIS — Z5181 Encounter for therapeutic drug level monitoring: Secondary | ICD-10-CM | POA: Diagnosis not present

## 2015-08-23 LAB — POCT INR: INR: 2.9

## 2015-09-03 ENCOUNTER — Other Ambulatory Visit (HOSPITAL_COMMUNITY): Payer: Self-pay | Admitting: *Deleted

## 2015-09-03 MED ORDER — LISINOPRIL 10 MG PO TABS: 5.0000 mg | ORAL_TABLET | Freq: Two times a day (BID) | ORAL | 4 refills | Status: DC

## 2015-09-04 ENCOUNTER — Encounter (HOSPITAL_BASED_OUTPATIENT_CLINIC_OR_DEPARTMENT_OTHER): Payer: Self-pay | Admitting: *Deleted

## 2015-09-04 ENCOUNTER — Ambulatory Visit (INDEPENDENT_AMBULATORY_CARE_PROVIDER_SITE_OTHER): Payer: Medicare Other | Admitting: *Deleted

## 2015-09-04 ENCOUNTER — Observation Stay (HOSPITAL_BASED_OUTPATIENT_CLINIC_OR_DEPARTMENT_OTHER)
Admission: EM | Admit: 2015-09-04 | Discharge: 2015-09-04 | Disposition: A | Discharge status: Home / Self Care | Payer: Medicare Other | Attending: Emergency Medicine | Admitting: Emergency Medicine

## 2015-09-04 DIAGNOSIS — I5022 Chronic systolic (congestive) heart failure: Secondary | ICD-10-CM | POA: Insufficient documentation

## 2015-09-04 DIAGNOSIS — W268XXA Contact with other sharp object(s), not elsewhere classified, initial encounter: Secondary | ICD-10-CM | POA: Diagnosis not present

## 2015-09-04 DIAGNOSIS — Z952 Presence of prosthetic heart valve: Secondary | ICD-10-CM | POA: Diagnosis not present

## 2015-09-04 DIAGNOSIS — Z792 Long term (current) use of antibiotics: Secondary | ICD-10-CM | POA: Insufficient documentation

## 2015-09-04 DIAGNOSIS — I429 Cardiomyopathy, unspecified: Secondary | ICD-10-CM

## 2015-09-04 DIAGNOSIS — S0181XA Laceration without foreign body of other part of head, initial encounter: Principal | ICD-10-CM | POA: Insufficient documentation

## 2015-09-04 DIAGNOSIS — F0390 Unspecified dementia without behavioral disturbance: Secondary | ICD-10-CM | POA: Insufficient documentation

## 2015-09-04 DIAGNOSIS — I4891 Unspecified atrial fibrillation: Secondary | ICD-10-CM | POA: Diagnosis not present

## 2015-09-04 DIAGNOSIS — Z79899 Other long term (current) drug therapy: Secondary | ICD-10-CM | POA: Diagnosis not present

## 2015-09-04 DIAGNOSIS — Y93E8 Activity, other personal hygiene: Secondary | ICD-10-CM | POA: Diagnosis not present

## 2015-09-04 DIAGNOSIS — Z7901 Long term (current) use of anticoagulants: Secondary | ICD-10-CM | POA: Diagnosis not present

## 2015-09-04 DIAGNOSIS — G454 Transient global amnesia: Secondary | ICD-10-CM | POA: Diagnosis present

## 2015-09-04 DIAGNOSIS — Z7982 Long term (current) use of aspirin: Secondary | ICD-10-CM | POA: Diagnosis not present

## 2015-09-04 DIAGNOSIS — Z95810 Presence of automatic (implantable) cardiac defibrillator: Secondary | ICD-10-CM

## 2015-09-04 LAB — PROTIME-INR
INR: 2.77
Prothrombin Time: 29.8 seconds — ABNORMAL HIGH (ref 11.4–15.2)

## 2015-09-04 MED ORDER — SILVER NITRATE-POT NITRATE 75-25 % EX MISC
CUTANEOUS | Status: AC
  Administered 2015-09-04: 17:00:00
  Filled 2015-09-04: qty 2

## 2015-09-04 NOTE — ED Notes (Signed)
MD at bedside. 

## 2015-09-04 NOTE — ED Provider Notes (Signed)
[place] DEPT [facility] Provider Note   CSN: [id number] Arrival date & time: 09/04/15  1610     History   Chief Complaint Chief Complaint  Patient presents with  . Laceration    HPI Jon Butler is a 80 y.o. male.  Patient is anticoagulated on Coumadin for atrial fibrillation and cut his face shaving, he has been unable to stop the bleeding with direct pressure.   The history is provided by the patient and the spouse.  Laceration   The incident occurred 1 to 2 hours ago. The laceration is located on the face. Size: punctate. Injury mechanism: razor blade. The patient is experiencing no pain. The pain has been constant since onset. He reports no foreign bodies present. His tetanus status is UTD.    Past Medical History:  Diagnosis Date  . Atrial fibrillation ([place])   . Cardiomyopathy   . Chronic systolic heart failure ([place])   . Dementia   . Left bundle-branch block   . Pneumonia 08/2014    Patient Active Problem List   Diagnosis Date Noted  . Pneumonia 04/04/2015  . Acute on chronic combined systolic and diastolic heart failure ([place]) 04/04/2015  . Dementia without behavioral disturbance 04/04/2015  . Moderate dementia without behavioral disturbance 02/27/2015  . Hypoxia   . Pressure ulcer 11/23/2014  . Urinary tract infection in elderly patient 11/22/2014  . UTI (urinary tract infection) 11/22/2014  . Lung nodule 11/22/2014  . Achalasia, esophageal 08/19/2014  . History of mechanical aortic valve replacement   . Palliative care encounter   . Cognitive impairment   . Dysphagia   . Alcohol use ([place]) 08/09/2014  . Community acquired pneumonia 08/08/2014  . Sepsis ([place]) 08/08/2014  . Encounter for therapeutic drug monitoring 01/28/2013  . Long term current use of anticoagulant therapy 10/29/2012  . Acute on chronic systolic heart failure ([place]) 10/21/2012  . Acute renal failure ([place]) 10/21/2012  . Acute respiratory failure with hypoxia ([place]) 10/15/2012  .  Pulmonary edema 10/15/2012  . Cardiac asthma ([place]) 10/15/2012  . Cardiomyopathy ([place]) 10/15/2012  . Poor venous access 10/15/2012  . Atrial fibrillation ([place]) 10/13/2012  . Chronic systolic heart failure ([place]) 11/06/2010  . Biventricular implantable cardioverter-defibrillator in situ 11/06/2010  . Ventricular tachycardia ([place]) 11/06/2010    Past Surgical History:  Procedure Laterality Date  . AORTIC VALVE REPLACEMENT    . BOTOX INJECTION N/A 08/18/2014   Procedure: BOTOX INJECTION;  Surgeon: Wonda Horner, MD;  Location[facility] ENDOSCOPY;  Service: Endoscopy;  Laterality: N/A;  . CARDIOVERSION N/A 10/25/2012   Procedure: CARDIOVERSION;  Surgeon: Evans Lance, MD;  Location: [place];  Service: Cardiovascular;  Laterality: N/A;  . CARDIOVERSION N/A 12/20/2012   Procedure: CARDIOVERSION;  Surgeon: Larey Dresser, MD;  Location: [place];  Service: Cardiovascular;  Laterality: N/A;  . CARDIOVERSION N/A 08/10/2014   Procedure: CARDIOVERSION;  Surgeon: Sanda Klein, MD;  Location: [facility] ENDOSCOPY;  Service: Cardiovascular;  Laterality: N/A;  . DOPPLER ECHOCARDIOGRAPHY  2008, 2009  . ESOPHAGOGASTRODUODENOSCOPY (EGD) WITH PROPOFOL N/A 08/18/2014   Procedure: ESOPHAGOGASTRODUODENOSCOPY (EGD) WITH PROPOFOL;  Surgeon: Wonda Horner, MD;  Location: [facility] ENDOSCOPY;  Service: Endoscopy;  Laterality: N/A;  . IMPLANTABLE CARDIOVERTER DEFIBRILLATOR GENERATOR CHANGE N/A 09/20/2012   Procedure: IMPLANTABLE CARDIOVERTER DEFIBRILLATOR GENERATOR CHANGE;  Surgeon: Evans Lance, MD;  Location: [facility] CATH LAB;  Service: Cardiovascular;  Laterality: N/A;  . TEE WITHOUT CARDIOVERSION N/A 10/15/2012   Procedure: TRANSESOPHAGEAL ECHOCARDIOGRAM (TEE);  Surgeon: Fay Records, MD;  Location: [facility]  ENDOSCOPY;  Service: Cardiovascular;  Laterality: N/A;  . TEE WITHOUT CARDIOVERSION N/A 08/10/2014   Procedure: TRANSESOPHAGEAL ECHOCARDIOGRAM (TEE);  Surgeon: Sanda Klein, MD;  Location: [facility] ENDOSCOPY;  Service: Cardiovascular;   Laterality: N/A;       Home Medications    Prior to Admission medications   Medication Sig Start Date End Date Taking? Authorizing Provider  allopurinol (ZYLOPRIM) 300 MG tablet Take 300 mg by mouth daily.     Historical Provider, MD  amiodarone (PACERONE) 200 MG tablet Take 1 tablet (200 mg total) by mouth daily. 04/16/15   Jolaine Artist, MD  amoxicillin (AMOXIL) 500 MG capsule Take 500 mg by mouth 3 (three) times a week.    Historical Provider, MD  aspirin EC 81 MG tablet Take 1 tablet (81 mg total) by mouth daily. 10/26/12   Brooke O Edmisten, PA-C  carvedilol (COREG) 3.125 MG tablet Take 3.125 mg by mouth 2 (two) times daily with a meal.    Historical Provider, MD  colchicine 0.6 MG tablet Take 0.6 mg by mouth daily as needed (gout pain). Reported on 12/21/2014    Historical Provider, MD  donepezil (ARICEPT) 23 MG TABS tablet Take 23 mg by mouth 2 (two) times daily.    Historical Provider, MD  fluticasone (FLONASE) 50 MCG/ACT nasal spray Place 2 sprays into both nostrils daily as needed for allergies or rhinitis. Reported on 12/21/2014 08/17/13   Historical Provider, MD  furosemide (LASIX) 40 MG tablet Take 1 tablet (40 mg total) by mouth 2 (two) times a week. Every Monday and Friday 06/21/15   Jolaine Artist, MD  lisinopril (PRINIVIL,ZESTRIL) 10 MG tablet Take 0.5 tablets (5 mg total) by mouth 2 (two) times daily. 09/03/15   Jolaine Artist, MD  LORazepam (ATIVAN) 0.5 MG tablet Take 0.5 mg by mouth at bedtime.    Historical Provider, MD  memantine (NAMENDA XR) 28 MG CP24 24 hr capsule Take 1 capsule (28 mg total) by mouth daily. 04/20/14   Penni Bombard, MD  metolazone (ZAROXOLYN) 2.5 MG tablet Take 1 tablet (2.5 mg total) by mouth daily. As needed for weight gain Patient not taking: Reported on 06/21/2015 02/20/15   Jolaine Artist, MD  rosuvastatin (CRESTOR) 10 MG tablet Take 5 mg by mouth daily. Reported on 03/22/2015    Historical Provider, MD  spironolactone (ALDACTONE)  25 MG tablet Take 0.5 tablets (12.5 mg total) by mouth daily. 10/27/12   Evans Lance, MD  thiamine (VITAMIN B-1) 100 MG tablet Take 1 tablet (100 mg total) by mouth 2 (two) times daily. 12/16/12   Amy D Ninfa Meeker, NP  traZODone (DESYREL) 100 MG tablet Take 450 mg by mouth at bedtime.  06/29/14   Historical Provider, MD  warfarin (COUMADIN) 2 MG tablet Take 1-1.5 tablets (2-3 mg total) by mouth daily at 6 PM. Take 2 mg (1 tablet) daily except on Sunday, Tuesday, and Friday. Take 3 mg (1.5 tablets) on Sunday, Tuesday, and Friday 11/24/14   Domenic Polite, MD    Family History Family History  Problem Relation Age of Onset  . Aneurysm Mother     brain  . Heart attack Father 74    Social History Social History  Substance Use Topics  . Smoking status: Never Smoker  . Smokeless tobacco: Never Used  . Alcohol use 0.0 oz/week    2 - 3 Glasses of wine per week     Comment: 2-3 glasses     Allergies   Review of patient's  allergies indicates no known allergies.   Review of Systems Review of Systems  Skin: Positive for wound.  All other systems reviewed and are negative.    Physical Exam Updated Vital Signs BP 113/68   Pulse 74   Temp 98.2 F (36.8 C) (Oral)   Resp 18   Ht 5\' 9"  (1.753 m)   Wt 177 lb (80.3 kg)   SpO2 97%   BMI 26.14 kg/m   Physical Exam  Constitutional: He is oriented to person, place, and time. He appears well-developed and well-nourished. No distress.  HENT:  Head: Normocephalic. Head is with laceration (punctate nick in the skin over the left chin).    Eyes: Conjunctivae are normal.  Neck: Neck supple. No tracheal deviation present.  Cardiovascular: Normal rate and regular rhythm.   Pulmonary/Chest: Effort normal. No respiratory distress.  Abdominal: Soft. He exhibits no distension.  Neurological: He is alert and oriented to person, place, and time.  Skin: Skin is warm and dry.  Psychiatric: He has a normal mood and affect.     ED Treatments /  Results  Labs (all labs ordered are listed, but only abnormal results are displayed) Labs Reviewed  PROTIME-INR - Abnormal; Notable for the following:       Result Value   Prothrombin Time 29.8 (*)    All other components within normal limits    EKG  EKG Interpretation None       Radiology No results found.  Procedures Cauterization Date/Time: 09/04/2015 4:54 PM Performed by: Leo Grosser Authorized by: Leo Grosser  Consent: Verbal consent obtained. Risks and benefits: risks, benefits and alternatives were discussed Consent given by: patient Patient understanding: patient states understanding of the procedure being performed Required items: required blood products, implants, devices, and special equipment available Patient identity confirmed: verbally with patient, arm band, provided demographic data and hospital-assigned identification number Local anesthesia used: no  Anesthesia: Local anesthesia used: no  Sedation: Patient sedated: no Patient tolerance: Patient tolerated the procedure well with no immediate complications Comments: Silver nitrate applied with good hemostasis achieved and layer of skin glue applied and allowed to dry for barrier protection    (including critical care time)  Medications Ordered in ED Medications  silver nitrate applicators [id number] % applicator (not administered)     Initial Impression / Assessment and Plan / ED Course  I have reviewed the triage vital signs and the nursing notes.  Pertinent labs & imaging results that were available during my care of the patient were reviewed by me and considered in my medical decision making (see chart for details).  Clinical Course    80 y.o. male presents with Small skin from shaving earlier this evening that caused bleeding that he could not control with direct pressure. This is likely secondary to anticoagulation on Coumadin. The patient does not appear anemic or have any other evident  complications from this injury. He is well-appearing. Hemostasis was achieved with layer of barrier protection applied to skin successfully. Return precautions were discussed, screening INR performed to determine if the patient requires adjustment of medication.   INR is therapeutic, patient may continue normal dosing of his Coumadin. Return precautions discussed for rebleeding and wound care discussed.  Final Clinical Impressions(s) / ED Diagnoses   Final diagnoses:  Facial laceration, initial encounter  Anticoagulated on Coumadin    New Prescriptions New Prescriptions   No medications on file     Leo Grosser, MD 09/04/15 1721

## 2015-09-04 NOTE — ED Triage Notes (Signed)
He was shaving his face and cut himself. He takes blood thinners and has not been able to stop the bleeding.

## 2015-09-05 ENCOUNTER — Ambulatory Visit (INDEPENDENT_AMBULATORY_CARE_PROVIDER_SITE_OTHER): Payer: Medicare Other | Admitting: Cardiovascular Disease

## 2015-09-05 DIAGNOSIS — Z5181 Encounter for therapeutic drug level monitoring: Secondary | ICD-10-CM

## 2015-09-05 DIAGNOSIS — Z95810 Presence of automatic (implantable) cardiac defibrillator: Secondary | ICD-10-CM

## 2015-09-05 DIAGNOSIS — I4891 Unspecified atrial fibrillation: Secondary | ICD-10-CM

## 2015-09-05 NOTE — Progress Notes (Signed)
Remote ICD transmission.   

## 2015-09-06 ENCOUNTER — Encounter: Payer: Self-pay | Admitting: Cardiology

## 2015-09-07 ENCOUNTER — Emergency Department (HOSPITAL_BASED_OUTPATIENT_CLINIC_OR_DEPARTMENT_OTHER)
Admission: EM | Admit: 2015-09-07 | Discharge: 2015-09-07 | Disposition: A | Discharge status: Home / Self Care | Payer: Medicare Other | Attending: Emergency Medicine | Admitting: Emergency Medicine

## 2015-09-07 ENCOUNTER — Encounter (HOSPITAL_BASED_OUTPATIENT_CLINIC_OR_DEPARTMENT_OTHER): Payer: Self-pay | Admitting: *Deleted

## 2015-09-07 DIAGNOSIS — W269XXA Contact with unspecified sharp object(s), initial encounter: Secondary | ICD-10-CM | POA: Diagnosis not present

## 2015-09-07 DIAGNOSIS — Y929 Unspecified place or not applicable: Secondary | ICD-10-CM | POA: Diagnosis not present

## 2015-09-07 DIAGNOSIS — Z79899 Other long term (current) drug therapy: Secondary | ICD-10-CM | POA: Insufficient documentation

## 2015-09-07 DIAGNOSIS — S0993XA Unspecified injury of face, initial encounter: Secondary | ICD-10-CM | POA: Diagnosis present

## 2015-09-07 DIAGNOSIS — Y9389 Activity, other specified: Secondary | ICD-10-CM | POA: Insufficient documentation

## 2015-09-07 DIAGNOSIS — I5043 Acute on chronic combined systolic (congestive) and diastolic (congestive) heart failure: Secondary | ICD-10-CM | POA: Insufficient documentation

## 2015-09-07 DIAGNOSIS — S0181XA Laceration without foreign body of other part of head, initial encounter: Secondary | ICD-10-CM | POA: Insufficient documentation

## 2015-09-07 DIAGNOSIS — Y999 Unspecified external cause status: Secondary | ICD-10-CM | POA: Diagnosis not present

## 2015-09-07 DIAGNOSIS — Z7982 Long term (current) use of aspirin: Secondary | ICD-10-CM | POA: Insufficient documentation

## 2015-09-07 MED ORDER — LIDOCAINE-EPINEPHRINE 2 %-1:100000 IJ SOLN
INTRAMUSCULAR | Status: AC
  Administered 2015-09-07: 1 mL
  Filled 2015-09-07: qty 1

## 2015-09-07 MED ORDER — LIDOCAINE-EPINEPHRINE 1 %-1:100000 IJ SOLN: 20.0000 mL | Freq: Once | INTRAMUSCULAR | Status: DC

## 2015-09-07 NOTE — ED Notes (Signed)
Pt's wife reports pt's INR was checked yesterday and it was 2.9.

## 2015-09-07 NOTE — ED Notes (Signed)
MD at bedside. 

## 2015-09-07 NOTE — ED Triage Notes (Signed)
Pt reports cutting himself on L chin this past Tuesday while shaving. Reports scratching the area yesterday and has had intermittent bleeding problems since then (pt on Coumadin). Bleeding controlled with gauze placed PTA.

## 2015-09-07 NOTE — ED Provider Notes (Signed)
[facility] DEPT [facility] Provider Note   CSN: [id number] Arrival date & time: 09/07/15  1132     History   Chief Complaint Chief Complaint  Patient presents with  . Abrasion    face/bleeding    HPI Jon Butler is a 80 y.o. male.  80 yo M with a chief complaint of bleeding. Patient cut himself shaving a couple days ago and had oozing from the area. Is unable to get to stop and came to the emergency department. Several nitrate was applied and then Dermabond. Patient was messing with it today and it started bleeding again. Some mild oozing. A bandage was placed by EMS.   The history is provided by the patient and the spouse.    Past Medical History:  Diagnosis Date  . Atrial fibrillation ([place])   . Cardiomyopathy   . Chronic systolic heart failure ([place])   . Dementia   . Left bundle-branch block   . Pneumonia 08/2014    Patient Active Problem List   Diagnosis Date Noted  . Transient global amnesia 09/04/2015  . Pneumonia 04/04/2015  . Acute on chronic combined systolic and diastolic heart failure ([place]) 04/04/2015  . Dementia without behavioral disturbance 04/04/2015  . Moderate dementia without behavioral disturbance 02/27/2015  . Hypoxia   . Pressure ulcer 11/23/2014  . Urinary tract infection in elderly patient 11/22/2014  . UTI (urinary tract infection) 11/22/2014  . Lung nodule 11/22/2014  . Achalasia, esophageal 08/19/2014  . History of mechanical aortic valve replacement   . Palliative care encounter   . Cognitive impairment   . Dysphagia   . Alcohol use ([place]) 08/09/2014  . Community acquired pneumonia 08/08/2014  . Sepsis ([place]) 08/08/2014  . Encounter for therapeutic drug monitoring 01/28/2013  . Long term current use of anticoagulant therapy 10/29/2012  . Acute on chronic systolic heart failure ([place]) 10/21/2012  . Acute renal failure ([place]) 10/21/2012  . Acute respiratory failure with hypoxia ([place]) 10/15/2012  . Pulmonary edema 10/15/2012  . Cardiac  asthma ([place]) 10/15/2012  . Cardiomyopathy ([place]) 10/15/2012  . Poor venous access 10/15/2012  . Atrial fibrillation ([place]) 10/13/2012  . Chronic systolic heart failure ([place]) 11/06/2010  . Biventricular implantable cardioverter-defibrillator in situ 11/06/2010  . Ventricular tachycardia ([place]) 11/06/2010    Past Surgical History:  Procedure Laterality Date  . AORTIC VALVE REPLACEMENT    . BOTOX INJECTION N/A 08/18/2014   Procedure: BOTOX INJECTION;  Surgeon: Wonda Horner, MD;  Location: [facility] ENDOSCOPY;  Service: Endoscopy;  Laterality: N/A;  . CARDIOVERSION N/A 10/25/2012   Procedure: CARDIOVERSION;  Surgeon: Evans Lance, MD;  Location: [facility];  Service: Cardiovascular;  Laterality: N/A;  . CARDIOVERSION N/A 12/20/2012   Procedure: CARDIOVERSION;  Surgeon: Larey Dresser, MD;  Location: [facility];  Service: Cardiovascular;  Laterality: N/A;  . CARDIOVERSION N/A 08/10/2014   Procedure: CARDIOVERSION;  Surgeon: Sanda Klein, MD;  Location: [facility] ENDOSCOPY;  Service: Cardiovascular;  Laterality: N/A;  . DOPPLER ECHOCARDIOGRAPHY  2008, 2009  . ESOPHAGOGASTRODUODENOSCOPY (EGD) WITH PROPOFOL N/A 08/18/2014   Procedure: ESOPHAGOGASTRODUODENOSCOPY (EGD) WITH PROPOFOL;  Surgeon: Wonda Horner, MD;  Location: [facility] ENDOSCOPY;  Service: Endoscopy;  Laterality: N/A;  . IMPLANTABLE CARDIOVERTER DEFIBRILLATOR GENERATOR CHANGE N/A 09/20/2012   Procedure: IMPLANTABLE CARDIOVERTER DEFIBRILLATOR GENERATOR CHANGE;  Surgeon: Evans Lance, MD;  Location: [facility] CATH LAB;  Service: Cardiovascular;  Laterality: N/A;  . TEE WITHOUT CARDIOVERSION N/A 10/15/2012   Procedure: TRANSESOPHAGEAL ECHOCARDIOGRAM (TEE);  Surgeon: Fay Records, MD;  Location: [facility] ENDOSCOPY;  Service: Cardiovascular;  Laterality: N/A;  . TEE WITHOUT CARDIOVERSION N/A 08/10/2014   Procedure: TRANSESOPHAGEAL ECHOCARDIOGRAM (TEE);  Surgeon: Sanda Klein, MD;  Location: [facility] ENDOSCOPY;  Service: Cardiovascular;  Laterality: N/A;       Home  Medications    Prior to Admission medications   Medication Sig Start Date End Date Taking? Authorizing Provider  allopurinol (ZYLOPRIM) 300 MG tablet Take 300 mg by mouth daily.    Yes Historical Provider, MD  amiodarone (PACERONE) 200 MG tablet Take 1 tablet (200 mg total) by mouth daily. 04/16/15  Yes Jolaine Artist, MD  amoxicillin (AMOXIL) 500 MG capsule Take 500 mg by mouth 3 (three) times a week.   Yes Historical Provider, MD  aspirin EC 81 MG tablet Take 1 tablet (81 mg total) by mouth daily. 10/26/12  Yes Brooke O Edmisten, PA-C  carvedilol (COREG) 3.125 MG tablet Take 3.125 mg by mouth 2 (two) times daily with a meal.   Yes Historical Provider, MD  colchicine 0.6 MG tablet Take 0.6 mg by mouth daily as needed (gout pain). Reported on 12/21/2014   Yes Historical Provider, MD  donepezil (ARICEPT) 23 MG TABS tablet Take 23 mg by mouth 2 (two) times daily.   Yes Historical Provider, MD  fluticasone (FLONASE) 50 MCG/ACT nasal spray Place 2 sprays into both nostrils daily as needed for allergies or rhinitis. Reported on 12/21/2014 08/17/13  Yes Historical Provider, MD  furosemide (LASIX) 40 MG tablet Take 1 tablet (40 mg total) by mouth 2 (two) times a week. Every Monday and Friday 06/21/15  Yes Jolaine Artist, MD  lisinopril (PRINIVIL,ZESTRIL) 10 MG tablet Take 0.5 tablets (5 mg total) by mouth 2 (two) times daily. 09/03/15  Yes Shaune Pascal Bensimhon, MD  LORazepam (ATIVAN) 0.5 MG tablet Take 0.5 mg by mouth at bedtime.   Yes Historical Provider, MD  memantine (NAMENDA XR) 28 MG CP24 24 hr capsule Take 1 capsule (28 mg total) by mouth daily. 04/20/14  Yes Penni Bombard, MD  metolazone (ZAROXOLYN) 2.5 MG tablet Take 1 tablet (2.5 mg total) by mouth daily. As needed for weight gain 02/20/15  Yes Jolaine Artist, MD  rosuvastatin (CRESTOR) 10 MG tablet Take 5 mg by mouth daily. Reported on 03/22/2015   Yes Historical Provider, MD  spironolactone (ALDACTONE) 25 MG tablet Take 0.5 tablets  (12.5 mg total) by mouth daily. 10/27/12  Yes Evans Lance, MD  thiamine (VITAMIN B-1) 100 MG tablet Take 1 tablet (100 mg total) by mouth 2 (two) times daily. 12/16/12  Yes Amy D Clegg, NP  traZODone (DESYREL) 100 MG tablet Take 450 mg by mouth at bedtime.  06/29/14  Yes Historical Provider, MD  warfarin (COUMADIN) 2 MG tablet Take 1-1.5 tablets (2-3 mg total) by mouth daily at 6 PM. Take 2 mg (1 tablet) daily except on Sunday, Tuesday, and Friday. Take 3 mg (1.5 tablets) on Sunday, Tuesday, and Friday 11/24/14  Yes Domenic Polite, MD    Family History Family History  Problem Relation Age of Onset  . Aneurysm Mother     brain  . Heart attack Father 28    Social History Social History  Substance Use Topics  . Smoking status: Never Smoker  . Smokeless tobacco: Never Used  . Alcohol use 0.0 oz/week    2 - 3 Glasses of wine per week     Comment: 2-3 glasses     Allergies   Review of patient's allergies indicates no known allergies.   Review  of Systems Review of Systems  Constitutional: Negative for chills.  HENT: Negative for congestion and facial swelling.   Eyes: Negative for discharge and visual disturbance.  Respiratory: Negative for shortness of breath.   Cardiovascular: Negative for chest pain and palpitations.  Gastrointestinal: Negative for diarrhea.  Musculoskeletal: Negative for arthralgias and myalgias.  Skin: Positive for wound. Negative for color change and rash.  Neurological: Negative for tremors, syncope and headaches.  Psychiatric/Behavioral: Negative for confusion and dysphoric mood.     Physical Exam Updated Vital Signs BP 100/71 (BP Location: Left Arm)   Pulse 69   Temp 98.3 F (36.8 C) (Oral)   Resp 18   Ht 5\' 9"  (1.753 m)   Wt 171 lb (77.6 kg)   SpO2 96%   BMI 25.25 kg/m   Physical Exam  Constitutional: He is oriented to person, place, and time. He appears well-developed and well-nourished.  HENT:  Head: Normocephalic and atraumatic.     Eyes: Conjunctivae and EOM are normal. Pupils are equal, round, and reactive to light.  Neck: Normal range of motion. No JVD present.  Cardiovascular: Normal rate and regular rhythm.   Pulmonary/Chest: Effort normal. No stridor. No respiratory distress.  Abdominal: He exhibits no distension. There is no tenderness. There is no guarding.  Musculoskeletal: Normal range of motion. He exhibits no edema.  Neurological: He is alert and oriented to person, place, and time.  Skin: Skin is warm and dry.  Psychiatric: He has a normal mood and affect. His behavior is normal.     ED Treatments / Results  Labs (all labs ordered are listed, but only abnormal results are displayed) Labs Reviewed - No data to display  EKG  EKG Interpretation None       Radiology No results found.  Procedures .Marland KitchenLaceration Repair Date/Time: 09/07/2015 12:31 PM Performed by: Tyrone Nine Lashawna Poche Authorized by: Deno Etienne   Consent:    Consent obtained:  Verbal   Consent given by:  Patient   Risks discussed:  Infection, pain and poor cosmetic result   Alternatives discussed:  No treatment Anesthesia (see MAR for exact dosages):    Anesthesia method:  Local infiltration   Local anesthetic:  Lidocaine 2% WITH epi Laceration details:    Location:  Face   Face location:  Chin   Length (cm):  0.5 Repair type:    Repair type:  Simple Exploration:    Hemostasis achieved with:  Epinephrine (figure of 8 stitch) Treatment:    Wound cleansed with: chlorihexidene.   Amount of cleaning:  Standard Skin repair:    Repair method:  Sutures   Suture size:  5-0   Suture material:  Nylon   Suture technique:  Figure eight   Number of sutures:  1 Approximation:    Approximation:  Close Post-procedure details:    Dressing:  Open (no dressing)   (including critical care time)  Medications Ordered in ED Medications  lidocaine-EPINEPHrine (XYLOCAINE W/EPI) 1 %-1:100000 (with pres) injection 20 mL (20 mLs Intradermal  Not Given 09/07/15 1214)  lidocaine-EPINEPHrine (XYLOCAINE W/EPI) 2 %-1:100000 (with pres) injection (1 mL  Given by Other 09/07/15 1215)     Initial Impression / Assessment and Plan / ED Course  I have reviewed the triage vital signs and the nursing notes.  Pertinent labs & imaging results that were available during my care of the patient were reviewed by me and considered in my medical decision making (see chart for details).  Clinical Course    80 yo  M With a chief complaint of bleeding. Patient had skin glue applied but picked off. Since patient is unable to refrain from manipulating the area I will place a suture. Bleeding controlled, suture removal in 3 days.   12:33 PM:  I have discussed the diagnosis/risks/treatment options with the patient and family and believe the pt to be eligible for discharge home to follow-up with PCP. We also discussed returning to the ED immediately if new or worsening sx occur. We discussed the sx which are most concerning (e.g., sudden worsening pain, fever, inability to tolerate by mouth) that necessitate immediate return. Medications administered to the patient during their visit and any new prescriptions provided to the patient are listed below.  Medications given during this visit Medications  lidocaine-EPINEPHrine (XYLOCAINE W/EPI) 1 %-1:100000 (with pres) injection 20 mL (20 mLs Intradermal Not Given 09/07/15 1214)  lidocaine-EPINEPHrine (XYLOCAINE W/EPI) 2 %-1:100000 (with pres) injection (1 mL  Given by Other 09/07/15 1215)     The patient appears reasonably screen and/or stabilized for discharge and I doubt any other medical condition or other [facility] requiring further screening, evaluation, or treatment in the ED at this time prior to discharge.    Final Clinical Impressions(s) / ED Diagnoses   Final diagnoses:  Facial laceration, initial encounter    New Prescriptions New Prescriptions   No medications on file     Deno Etienne, DO 09/07/15  1233

## 2015-09-12 DIAGNOSIS — D696 Thrombocytopenia, unspecified: Secondary | ICD-10-CM | POA: Diagnosis not present

## 2015-09-12 DIAGNOSIS — R946 Abnormal results of thyroid function studies: Secondary | ICD-10-CM | POA: Diagnosis not present

## 2015-09-12 DIAGNOSIS — I482 Chronic atrial fibrillation: Secondary | ICD-10-CM | POA: Diagnosis not present

## 2015-09-12 DIAGNOSIS — I10 Essential (primary) hypertension: Secondary | ICD-10-CM | POA: Diagnosis not present

## 2015-09-12 DIAGNOSIS — F0390 Unspecified dementia without behavioral disturbance: Secondary | ICD-10-CM | POA: Diagnosis not present

## 2015-09-12 DIAGNOSIS — Z1389 Encounter for screening for other disorder: Secondary | ICD-10-CM | POA: Diagnosis not present

## 2015-09-12 DIAGNOSIS — Z23 Encounter for immunization: Secondary | ICD-10-CM | POA: Diagnosis not present

## 2015-09-12 DIAGNOSIS — I5022 Chronic systolic (congestive) heart failure: Secondary | ICD-10-CM | POA: Diagnosis not present

## 2015-09-12 DIAGNOSIS — R7303 Prediabetes: Secondary | ICD-10-CM | POA: Diagnosis not present

## 2015-09-12 DIAGNOSIS — E782 Mixed hyperlipidemia: Secondary | ICD-10-CM | POA: Diagnosis not present

## 2015-09-12 DIAGNOSIS — Z952 Presence of prosthetic heart valve: Secondary | ICD-10-CM | POA: Diagnosis not present

## 2015-09-12 DIAGNOSIS — Z0000 Encounter for general adult medical examination without abnormal findings: Secondary | ICD-10-CM | POA: Diagnosis not present

## 2015-09-17 LAB — CUP PACEART REMOTE DEVICE CHECK
Battery Voltage: 2.97 V
Brady Statistic AP VP Percent: 76.95 %
Brady Statistic AS VP Percent: 18.84 %
Brady Statistic AS VS Percent: 3.08 %
Brady Statistic RV Percent Paced: 82.5 %
Date Time Interrogation Session: 20170829041606
HIGH POWER IMPEDANCE MEASURED VALUE: 40 Ohm
HighPow Impedance: 51 Ohm
Implantable Lead Implant Date: 20090326
Implantable Lead Location: 753858
Implantable Lead Location: 753859
Implantable Lead Location: 753860
Implantable Lead Model: 4194
Implantable Lead Model: 5076
Lead Channel Impedance Value: 285 Ohm
Lead Channel Impedance Value: 342 Ohm
Lead Channel Impedance Value: 418 Ohm
Lead Channel Impedance Value: 418 Ohm
Lead Channel Impedance Value: 608 Ohm
Lead Channel Pacing Threshold Amplitude: 0.75 V
Lead Channel Pacing Threshold Amplitude: 1 V
Lead Channel Pacing Threshold Pulse Width: 0.4 ms
Lead Channel Pacing Threshold Pulse Width: 0.4 ms
Lead Channel Pacing Threshold Pulse Width: 0.4 ms
Lead Channel Sensing Intrinsic Amplitude: 0.5 mV
Lead Channel Sensing Intrinsic Amplitude: 16.125 mV
Lead Channel Setting Pacing Amplitude: 2 V
Lead Channel Setting Pacing Amplitude: 2.5 V
Lead Channel Setting Pacing Pulse Width: 0.4 ms
Lead Channel Setting Pacing Pulse Width: 0.4 ms
Lead Channel Setting Sensing Sensitivity: 0.6 mV
MDC IDC LEAD IMPLANT DT: 20090326
MDC IDC LEAD IMPLANT DT: 20090326
MDC IDC MSMT BATTERY REMAINING LONGEVITY: 55 mo
MDC IDC MSMT LEADCHNL LV IMPEDANCE VALUE: 456 Ohm
MDC IDC MSMT LEADCHNL RA SENSING INTR AMPL: 0.5 mV
MDC IDC MSMT LEADCHNL RV PACING THRESHOLD AMPLITUDE: 0.75 V
MDC IDC MSMT LEADCHNL RV SENSING INTR AMPL: 16.125 mV
MDC IDC SET LEADCHNL LV PACING AMPLITUDE: 1.75 V
MDC IDC STAT BRADY AP VS PERCENT: 1.13 %
MDC IDC STAT BRADY RA PERCENT PACED: 78.08 %

## 2015-09-25 ENCOUNTER — Other Ambulatory Visit (HOSPITAL_COMMUNITY): Payer: Self-pay | Admitting: *Deleted

## 2015-09-25 MED ORDER — AMIODARONE HCL 200 MG PO TABS: 200.0000 mg | ORAL_TABLET | Freq: Every day | ORAL | 3 refills | Status: DC

## 2015-09-27 ENCOUNTER — Encounter: Payer: Self-pay | Admitting: Internal Medicine

## 2015-09-27 ENCOUNTER — Ambulatory Visit (HOSPITAL_COMMUNITY)
Admission: RE | Admit: 2015-09-27 | Discharge: 2015-09-27 | Disposition: A | Discharge status: Home / Self Care | Payer: Medicare Other | Source: Ambulatory Visit | Attending: Internal Medicine | Admitting: Internal Medicine

## 2015-09-27 ENCOUNTER — Encounter (HOSPITAL_COMMUNITY): Payer: Self-pay | Admitting: Internal Medicine

## 2015-09-27 VITALS — BP 98/58 | HR 86 | Wt 166.8 lb

## 2015-09-27 DIAGNOSIS — I480 Paroxysmal atrial fibrillation: Secondary | ICD-10-CM | POA: Insufficient documentation

## 2015-09-27 DIAGNOSIS — I5022 Chronic systolic (congestive) heart failure: Secondary | ICD-10-CM | POA: Diagnosis not present

## 2015-09-27 DIAGNOSIS — F0390 Unspecified dementia without behavioral disturbance: Secondary | ICD-10-CM | POA: Diagnosis not present

## 2015-09-27 DIAGNOSIS — Z7901 Long term (current) use of anticoagulants: Secondary | ICD-10-CM | POA: Diagnosis not present

## 2015-09-27 DIAGNOSIS — Z952 Presence of prosthetic heart valve: Secondary | ICD-10-CM | POA: Diagnosis not present

## 2015-09-27 DIAGNOSIS — N183 Chronic kidney disease, stage 3 (moderate): Secondary | ICD-10-CM | POA: Diagnosis not present

## 2015-09-27 DIAGNOSIS — R413 Other amnesia: Secondary | ICD-10-CM | POA: Insufficient documentation

## 2015-09-27 DIAGNOSIS — Z9119 Patient's noncompliance with other medical treatment and regimen: Secondary | ICD-10-CM | POA: Insufficient documentation

## 2015-09-27 DIAGNOSIS — I429 Cardiomyopathy, unspecified: Secondary | ICD-10-CM | POA: Diagnosis not present

## 2015-09-27 DIAGNOSIS — I447 Left bundle-branch block, unspecified: Secondary | ICD-10-CM | POA: Insufficient documentation

## 2015-09-27 DIAGNOSIS — F1010 Alcohol abuse, uncomplicated: Secondary | ICD-10-CM | POA: Diagnosis not present

## 2015-09-27 DIAGNOSIS — I4891 Unspecified atrial fibrillation: Secondary | ICD-10-CM | POA: Diagnosis not present

## 2015-09-27 DIAGNOSIS — Z66 Do not resuscitate: Secondary | ICD-10-CM | POA: Insufficient documentation

## 2015-09-27 LAB — BASIC METABOLIC PANEL
ANION GAP: 11 (ref 5–15)
BUN: 19 mg/dL (ref 6–20)
CO2: 27 mmol/L (ref 22–32)
Calcium: 8.6 mg/dL — ABNORMAL LOW (ref 8.9–10.3)
Chloride: 101 mmol/L (ref 101–111)
Creatinine, Ser: 0.96 mg/dL (ref 0.61–1.24)
GFR calc Af Amer: >60 mL/min (ref >60)
Glucose, Bld: 113 mg/dL — ABNORMAL HIGH (ref 65–99)
POTASSIUM: 4.4 mmol/L (ref 3.5–5.1)
SODIUM: 139 mmol/L (ref 135–145)

## 2015-09-27 MED ORDER — FUROSEMIDE 40 MG PO TABS: 40.0000 mg | ORAL_TABLET | Freq: Every day | ORAL | 3 refills | Status: DC

## 2015-09-27 NOTE — Patient Instructions (Signed)
Routine lab work today. Will notify you of abnormal results\  Follow up with Dr.Bensimhon in 2 months  

## 2015-09-27 NOTE — Progress Notes (Signed)
Patient ID: Jon Butler, male   DOB: Jun 14, 1935, 80 y.o.   MRN: [id number] Patient ID: Jon Butler, male   DOB: 12/16/1935, 80 y.o.   MRN: [id number]   Advanced Heart Failure Note   Patient ID: Jon Butler, male   DOB: 1935-05-26, 80 y.o.   MRN: [id number] PCP: Dr Lysle Rubens EP: Dr Lovena Le [facility] HF: Kino Dunsworth  HPI: Jon Butler is a 80 yo male with a history of chronic systolic HF EF 123456 due to NICM s/p CRT-D (Medtronic), mechanical AVR (St Jude), LBBB, PAF S/P DC-CV 12/20/12, ETOH abuse and dementia and non-compliance.   Admitted to [facility] 10/10 through 10/26/12 with ADHF in setting of recurrent AF. Developed cardiogenic shock and VDRF   BB and ACE-I held and amiodarone gtt was started. Required short term Milrinone and lasix.Diuresed 26 Butler and transitioned to 40 mg daily. DC-CV on 10/20. Remains on Coumadin. Jon Butler.   S/P successful DC-CV 12/20/12. Amio increased to 400 bid.   Has seen Dr. Sherron Flemings in Neurology for progressive cognitive decline in setting of ETOH abuse. Also had foot drop.   Admitted in 8/16 for respiratory failure due to PNA and HF in setting of recurrent AF. Underwent TEE/DC-CV with nearly immediate reversion to AF.Seen by EP and felt to have end-stage HF and rate control strategy recommended. Amio stopped. Also underwent botox injection in esophagus for esophageal spasm/dysphagia. Made DNR and ICD deactivated. Weight at home 193-194 (was discharged at 191 pound)   Saw Dr. Lovena Le in November 2016 and was in AFL paced back into NSR.  Admitted in March 2017 with pneumonia and possible sepsis. Treated abx.   Returns for Heart Failure follow up.  Says he is fels fine. Wife says last month his weight wwent up about 15 Butler to 178. She increased lasix from 40mg  on Monday and Friday to 40mg  daily. On ICD interrogation shows AF from aug 24 through first week of September with loss of BiV pacing and increased fluid. Fluid now back down. Weight down to  166. Doesn't do much at home. Uses cane when walking. No longer drinking ETOH. Confusion at times.   Echo 3/17 25-30%   11/01/12 Labs INR > 8 10/26/12 K 3.0 Creatinine 1.23  12/16/12 K 4.6 Creatinine 1.05 12/20/12 K 4.0 Creatinine 1.04 11/15      K 4.4 Creatinine 1.1 5/16: K 4.3 Creatinine 0.94  6/16: INR 1.9  09/07/14: K 4.0, Cr 1.00 01/05/15 K 4.7 creatinine 0.9  ICD interrogation : as above   ROS: All systems negative except as listed in HPI, PMH and Problem List.  Past Medical History:  Diagnosis Date  . Atrial fibrillation ([place])   . Cardiomyopathy   . Chronic systolic heart failure ([place])   . Dementia   . Left bundle-branch block   . Pneumonia 08/2014    Current Outpatient Prescriptions  Medication Sig Dispense Refill  . allopurinol (ZYLOPRIM) 300 MG tablet Take 300 mg by mouth daily.     Marland Kitchen amiodarone (PACERONE) 200 MG tablet Take 1 tablet (200 mg total) by mouth daily. 30 tablet 3  . amoxicillin (AMOXIL) 500 MG capsule Take 500 mg by mouth 3 (three) times a week.    Marland Kitchen aspirin EC 81 MG tablet Take 1 tablet (81 mg total) by mouth daily.    . carvedilol (COREG) 3.125 MG tablet Take 3.125 mg by mouth 2 (two) times daily with a meal.    . colchicine 0.6 MG tablet Take 0.6 mg  by mouth daily as needed (gout pain). Reported on 12/21/2014    . donepezil (ARICEPT) 23 MG TABS tablet Take 23 mg by mouth 2 (two) times daily.    . fluticasone (FLONASE) 50 MCG/ACT nasal spray Place 2 sprays into both nostrils daily as needed for allergies or rhinitis. Reported on 12/21/2014    . furosemide (LASIX) 40 MG tablet Take 40 mg by mouth daily.    Marland Kitchen lisinopril (PRINIVIL,ZESTRIL) 10 MG tablet Take 0.5 tablets (5 mg total) by mouth 2 (two) times daily. 30 tablet 4  . LORazepam (ATIVAN) 0.5 MG tablet Take 0.5 mg by mouth at bedtime.    . memantine (NAMENDA XR) 28 MG CP24 24 hr capsule Take 1 capsule (28 mg total) by mouth daily. 30 capsule 6  . rosuvastatin (CRESTOR) 10 MG tablet Take 5 mg by  mouth daily. Reported on 03/22/2015    . spironolactone (ALDACTONE) 25 MG tablet Take 0.5 tablets (12.5 mg total) by mouth daily. 30 tablet 3  . thiamine (VITAMIN B-1) 100 MG tablet Take 1 tablet (100 mg total) by mouth 2 (two) times daily. 60 tablet 6  . traZODone (DESYREL) 100 MG tablet Take 450 mg by mouth at bedtime.   2  . warfarin (COUMADIN) 2 MG tablet Take 1-1.5 tablets (2-3 mg total) by mouth daily at 6 PM. Take 2 mg (1 tablet) daily except on Sunday, Tuesday, and Friday. Take 3 mg (1.5 tablets) on Sunday, Tuesday, and Friday    . metolazone (ZAROXOLYN) 2.5 MG tablet Take 1 tablet (2.5 mg total) by mouth daily. As needed for weight gain (Patient not taking: Reported on 09/27/2015) 30 tablet 3   No current facility-administered medications for this encounter.    Vitals:   09/27/15 1347  BP: (!) 98/58  Pulse: 86  SpO2: 97%  Weight: 166 lb 12 oz (75.6 kg)     PHYSICAL EXAM: General:  Elderly. No resp difficulty Wife present HEENT: normal Neck: supple. JVP 5-6 cm.  Carotids 2+ bilaterally; no bruits. No lymphadenopathy or thryomegaly appreciated. Cor: PMI normal. RRR. Mechanical s2 No rubs, gallops 2/6 TR. Lungs: clear  Abdomen: soft, nontender,no distention. No HSM. No bruits or masses. Good bowel sounds. Extremities: no cyanosis, clubbing, rash, no edema Neuro: alert & orientedx3, cranial nerves grossly intact. Moves all 4 extremities w/o difficulty. Affect normal  ASSESSMENT & PLAN: 1. Chronic Systolic Heart Failure TEE with EF 15-20%  Up to 25-30% in 3/17  S/P Medtronic CRT-D 09/2012  - Volume status was markedly elevated several weeks ago when he went into AF. Now improved. Continue lasix 40 daily. Can cut to every other day if weight drops below 164 - Stable NYHA III symptoms - Continue current meds  2. A Fib-  S/P DC-CV 12/20/12 but reverted to AF. Had AFL in 11/1 and paced out.  - Developed recurrent AF with loss of biv pacing in late August and early September noted on  device interrogation. Now back in NSR. Continue amio at 200 daily.  - Continue coumadin for AF and AVR.  - We have asked EP to reprogram device so ICD notifies [place] of recurrent AF when it occurs.  3. CKD stage 3- stable on recent labs. Recheck today 4. Alcohol- Reinforced alcohol abstinence especially with coumadin.  5. Memory loss-  Neurology following. On namenda and aricept..  6. Mechanical AVR - on coumadin. SBE prophylaxis for all procedures.    Glori Bickers, MD  2:19 PM

## 2015-10-01 NOTE — Progress Notes (Signed)
Referred to ICM clinic by Chanetta Marshall, NP.  Call to patient and spoke with wife, [name].  Explained ICM program and she agreed to monthly calls.  Provided ICM direct number and encouraged to call if he has fluid symptoms.  Last office visit with Dr Haroldine Laws 09/27/2015 and next office visit 11/22/2015.  Office visit with Dr Lovena Le 12/05/2015.   1st ICM remote transmission scheduled for 10/31/2015.

## 2015-10-02 ENCOUNTER — Other Ambulatory Visit (HOSPITAL_COMMUNITY): Payer: Self-pay | Admitting: *Deleted

## 2015-10-02 ENCOUNTER — Ambulatory Visit (INDEPENDENT_AMBULATORY_CARE_PROVIDER_SITE_OTHER): Payer: Medicare Other

## 2015-10-02 DIAGNOSIS — Z7901 Long term (current) use of anticoagulants: Secondary | ICD-10-CM

## 2015-10-02 DIAGNOSIS — I4891 Unspecified atrial fibrillation: Secondary | ICD-10-CM

## 2015-10-02 DIAGNOSIS — Z95810 Presence of automatic (implantable) cardiac defibrillator: Secondary | ICD-10-CM

## 2015-10-02 DIAGNOSIS — Z5181 Encounter for therapeutic drug level monitoring: Secondary | ICD-10-CM | POA: Diagnosis not present

## 2015-10-02 LAB — POCT INR: INR: 3.7

## 2015-10-09 ENCOUNTER — Encounter: Payer: Self-pay | Admitting: Internal Medicine

## 2015-10-12 ENCOUNTER — Other Ambulatory Visit (HOSPITAL_COMMUNITY): Payer: Self-pay | Admitting: *Deleted

## 2015-10-12 DIAGNOSIS — E039 Hypothyroidism, unspecified: Secondary | ICD-10-CM | POA: Diagnosis not present

## 2015-10-15 ENCOUNTER — Other Ambulatory Visit (HOSPITAL_COMMUNITY): Payer: Self-pay | Admitting: *Deleted

## 2015-10-17 ENCOUNTER — Ambulatory Visit (INDEPENDENT_AMBULATORY_CARE_PROVIDER_SITE_OTHER): Payer: Medicare Other | Admitting: *Deleted

## 2015-10-17 ENCOUNTER — Other Ambulatory Visit (HOSPITAL_COMMUNITY): Payer: Self-pay | Admitting: *Deleted

## 2015-10-17 DIAGNOSIS — Z7901 Long term (current) use of anticoagulants: Secondary | ICD-10-CM

## 2015-10-17 DIAGNOSIS — Z5181 Encounter for therapeutic drug level monitoring: Secondary | ICD-10-CM | POA: Diagnosis not present

## 2015-10-17 DIAGNOSIS — I4891 Unspecified atrial fibrillation: Secondary | ICD-10-CM

## 2015-10-17 DIAGNOSIS — Z95810 Presence of automatic (implantable) cardiac defibrillator: Secondary | ICD-10-CM | POA: Diagnosis not present

## 2015-10-17 LAB — POCT INR: INR: 4

## 2015-10-18 ENCOUNTER — Other Ambulatory Visit (HOSPITAL_COMMUNITY): Payer: Self-pay | Admitting: *Deleted

## 2015-10-31 ENCOUNTER — Ambulatory Visit (INDEPENDENT_AMBULATORY_CARE_PROVIDER_SITE_OTHER): Payer: Medicare Other

## 2015-10-31 ENCOUNTER — Ambulatory Visit (INDEPENDENT_AMBULATORY_CARE_PROVIDER_SITE_OTHER): Payer: Medicare Other | Admitting: *Deleted

## 2015-10-31 ENCOUNTER — Telehealth: Payer: Self-pay

## 2015-10-31 DIAGNOSIS — Z7901 Long term (current) use of anticoagulants: Secondary | ICD-10-CM

## 2015-10-31 DIAGNOSIS — Z95810 Presence of automatic (implantable) cardiac defibrillator: Secondary | ICD-10-CM | POA: Diagnosis not present

## 2015-10-31 DIAGNOSIS — Z5181 Encounter for therapeutic drug level monitoring: Secondary | ICD-10-CM | POA: Diagnosis not present

## 2015-10-31 DIAGNOSIS — I5022 Chronic systolic (congestive) heart failure: Secondary | ICD-10-CM | POA: Diagnosis not present

## 2015-10-31 DIAGNOSIS — I4891 Unspecified atrial fibrillation: Secondary | ICD-10-CM

## 2015-10-31 LAB — POCT INR: INR: 3.3

## 2015-10-31 NOTE — Telephone Encounter (Signed)
Remote ICM transmission received.  Attempted patient call and spoke with wife.  She was driving and I stated I would call her back later.

## 2015-10-31 NOTE — Progress Notes (Signed)
EPIC Encounter for ICM Monitoring  Patient Name: Jon Butler is a 80 y.o. male Date: 10/31/2015 Primary Care Physican: [name], MD Primary Cardiologist: [facility] [place] [facility] Electrophysiologist: Lovena Le Dry Weight: 172 lb current weight today Bi-V Pacing:  78.4%    Spoke with wife (DPR).  Heart Failure questions reviewed, pt symptomatic with some stomach bloating which is normally where he holds fluid.    Thoracic impedance abnormal suggesting fluid accumulation.  Wife has some confusion regarding how often patient should be taking Furosemide 40 mg and has been taking incorrectly since 09/27/2015.    Reviewed Dr Bensimhon's notes from 6/15 and 9/21.  Patient started on Furosemide 40 mg Monday and Friday on 6/15 and at 9/21 visit, she had told Dr Haroldine Laws that she had increased it to daily because his weight increased to 178 lbs.  9/21 note says patient could continue Furosemide 40 mg daily and could decrease to every other day if weight dropped < 164 lbs.    She also wants to know why the Potassium was stopped and he was taking that a couple of months ago.  Explained regarding taking Potassium and Spironolactone and the medications together can increase Potassium too high such as in July when his Potassium was 5.2.  Explained Potassium was within normal limits 09/27/2015 without him taking Potassium pills.   Labs: 09/27/2015 Creatinine 0.96, BUN 19, Potassium 4.4, Sodium 139  07/27/2015 Creatinine 0.98, BUN 14, Potassium 5.2, Sodium 136  04/07/2015 Creatinine 0.71, BUN 17, Potassium 3.8, Sodium 141  04/06/2015 Creatinine 0.70, BUN 20, Potassium 4.4, Sodium 141   Recommendations:  Advised to take Furosemide 40 mg daily as instructed at last office visit with Dr Haroldine Laws and to decrease to every other day if weight drops < 164 lbs.  If any further recommendations from Dr Haroldine Laws will call her back.   Follow-up plan: ICM clinic phone appointment on 12/18/2015.  Office visit with Dr Haroldine Laws on  11/16 and Dr Lovena Le 11/29.   Copy of ICM check sent to primary cardiologist and device physician.   ICM trend: 10/31/2015       Rosalene Billings, RN 10/31/2015 12:55 PM

## 2015-11-14 ENCOUNTER — Encounter (INDEPENDENT_AMBULATORY_CARE_PROVIDER_SITE_OTHER): Payer: Self-pay

## 2015-11-14 ENCOUNTER — Telehealth: Payer: Self-pay | Admitting: *Deleted

## 2015-11-14 ENCOUNTER — Ambulatory Visit (INDEPENDENT_AMBULATORY_CARE_PROVIDER_SITE_OTHER): Payer: Medicare Other | Admitting: *Deleted

## 2015-11-14 ENCOUNTER — Other Ambulatory Visit (INDEPENDENT_AMBULATORY_CARE_PROVIDER_SITE_OTHER): Payer: Medicare Other | Admitting: *Deleted

## 2015-11-14 DIAGNOSIS — I4891 Unspecified atrial fibrillation: Secondary | ICD-10-CM | POA: Diagnosis not present

## 2015-11-14 DIAGNOSIS — Z7901 Long term (current) use of anticoagulants: Secondary | ICD-10-CM

## 2015-11-14 DIAGNOSIS — Z95810 Presence of automatic (implantable) cardiac defibrillator: Secondary | ICD-10-CM | POA: Diagnosis not present

## 2015-11-14 DIAGNOSIS — I480 Paroxysmal atrial fibrillation: Secondary | ICD-10-CM

## 2015-11-14 DIAGNOSIS — I5022 Chronic systolic (congestive) heart failure: Secondary | ICD-10-CM | POA: Diagnosis not present

## 2015-11-14 DIAGNOSIS — Z5181 Encounter for therapeutic drug level monitoring: Secondary | ICD-10-CM | POA: Diagnosis not present

## 2015-11-14 LAB — POCT INR: INR: 2.6

## 2015-11-14 NOTE — Telephone Encounter (Signed)
Pt came for an appointment  to the coumadin clinic, when he came in he C/O of SOB, weakness.Triage   RN was called to assess pt . BP 104/68 HR 65 beats/minute via EKG, pt's wife said that pt has been coughing during the night,and has a gargling sound in his chest. His abdomen has been swollen for the last 2 days, pt has edema in both ankle and up to his mid calf. Pt's PCO2   95% at room air. Pt was able to go to scale to weigh without distress. Pt's last office visit with Dr. Haroldine Laws on 09/27/15 wt was 166 lbs. Today's wt is 178.12 lbs. Per pt's wife pt has not taken the Lasix 40 mg today. Spoke with Kevan Rosebush RN, and made her aware of pt's symptoms. Heather  spoke with Dr. Haroldine Laws. MD recommended for pt to increased her lasix 40 mg to  twice a day. Pt to come to the HF clinic on Friday 10 th at 10:20 AM. Pt's wife and daughter aware of MD's recommendations.

## 2015-11-16 ENCOUNTER — Ambulatory Visit (HOSPITAL_COMMUNITY)
Admission: RE | Admit: 2015-11-16 | Discharge: 2015-11-16 | Disposition: A | Discharge status: Home / Self Care | Payer: Medicare Other | Source: Ambulatory Visit | Attending: Cardiology | Admitting: Cardiology

## 2015-11-16 ENCOUNTER — Encounter (HOSPITAL_COMMUNITY): Payer: Self-pay

## 2015-11-16 VITALS — BP 96/56 | HR 86 | Wt 172.2 lb

## 2015-11-16 DIAGNOSIS — Z7901 Long term (current) use of anticoagulants: Secondary | ICD-10-CM | POA: Diagnosis not present

## 2015-11-16 DIAGNOSIS — R413 Other amnesia: Secondary | ICD-10-CM | POA: Diagnosis not present

## 2015-11-16 DIAGNOSIS — I447 Left bundle-branch block, unspecified: Secondary | ICD-10-CM | POA: Diagnosis not present

## 2015-11-16 DIAGNOSIS — Z79899 Other long term (current) drug therapy: Secondary | ICD-10-CM | POA: Diagnosis not present

## 2015-11-16 DIAGNOSIS — I429 Cardiomyopathy, unspecified: Secondary | ICD-10-CM | POA: Diagnosis not present

## 2015-11-16 DIAGNOSIS — Z952 Presence of prosthetic heart valve: Secondary | ICD-10-CM | POA: Insufficient documentation

## 2015-11-16 DIAGNOSIS — Z7982 Long term (current) use of aspirin: Secondary | ICD-10-CM | POA: Diagnosis not present

## 2015-11-16 DIAGNOSIS — N183 Chronic kidney disease, stage 3 unspecified: Secondary | ICD-10-CM

## 2015-11-16 DIAGNOSIS — K224 Dyskinesia of esophagus: Secondary | ICD-10-CM | POA: Insufficient documentation

## 2015-11-16 DIAGNOSIS — I480 Paroxysmal atrial fibrillation: Secondary | ICD-10-CM | POA: Diagnosis not present

## 2015-11-16 DIAGNOSIS — I5022 Chronic systolic (congestive) heart failure: Secondary | ICD-10-CM | POA: Diagnosis not present

## 2015-11-16 DIAGNOSIS — Z66 Do not resuscitate: Secondary | ICD-10-CM | POA: Insufficient documentation

## 2015-11-16 DIAGNOSIS — Z9119 Patient's noncompliance with other medical treatment and regimen: Secondary | ICD-10-CM | POA: Insufficient documentation

## 2015-11-16 DIAGNOSIS — R05 Cough: Secondary | ICD-10-CM | POA: Diagnosis not present

## 2015-11-16 DIAGNOSIS — F0390 Unspecified dementia without behavioral disturbance: Secondary | ICD-10-CM | POA: Diagnosis not present

## 2015-11-16 MED ORDER — FUROSEMIDE 40 MG PO TABS: 40.0000 mg | ORAL_TABLET | Freq: Every day | ORAL | 3 refills | Status: DC

## 2015-11-16 NOTE — Progress Notes (Signed)
Advanced Heart Failure Note   Patient ID: Jon Butler, male   DOB: 1935-12-27, 80 y.o.   MRN: [id number] PCP: Dr Lysle Rubens EP: Dr Lovena [facility] HF: [place]  HPI: [name] is a 80 yo male with a history of chronic systolic HF EF 123456 due to NICM s/p CRT-D (Medtronic), mechanical AVR (St Jude), LBBB, PAF S/P DC-CV 12/20/12, ETOH abuse and dementia and non-compliance.   Admitted to [facility] 10/10 through 10/26/12 with ADHF in setting of recurrent AF. Developed cardiogenic shock and VDRF   BB and ACE-I held and amiodarone gtt was started. Required short term Milrinone and lasix.Diuresed 26 pounds and transitioned to 40 mg daily. DC-CV on 10/20. Remains on Coumadin. D/C weight 206 pounds.   S/P successful DC-CV 12/20/12. Amio increased to 400 bid.   Has seen Dr. Sherron Flemings in Neurology for progressive cognitive decline in setting of ETOH abuse. Also had foot drop.   Admitted in 8/16 for respiratory failure due to PNA and HF in setting of recurrent AF. Underwent TEE/DC-CV with nearly immediate reversion to AF.Seen by EP and felt to have end-stage HF and rate control strategy recommended. Amio stopped. Also underwent botox injection in esophagus for esophageal spasm/dysphagia. Made DNR and ICD deactivated. Weight at home 193-194 (was discharged at 191 pound)   Saw Dr. Lovena Le in November 2016 and was in AFL paced back into NSR.  Admitted in March 2017 with pneumonia and possible sepsis. Treated abx.   Today he returns for an acute visit due to volume overload. On November 8th  he was instructed to lasix increase lasix for few days. Weight at home trending down from 178 to 172 pounds. Denies SOB/PND. Sleeping upright. Coughing at night.  Appetite ok. Not drinking alcohol.   Echo 3/17 25-30%   11/01/12 Labs INR > 8 10/26/12 K 3.0 Creatinine 1.23  12/16/12 K 4.6 Creatinine 1.05 12/20/12 K 4.0 Creatinine 1.04 11/15      K 4.4 Creatinine 1.1 5/16: K 4.3 Creatinine 0.94  6/16: INR 1.9    09/07/14: K 4.0, Cr 1.00 01/05/15 K 4.7 creatinine 0.9    ROS: All systems negative except as listed in HPI, PMH and Problem List.  Past Medical History:  Diagnosis Date  . Atrial fibrillation ([place])   . Cardiomyopathy   . Chronic systolic heart failure ([place])   . Dementia   . Left bundle-branch block   . Pneumonia 08/2014    Current Outpatient Prescriptions  Medication Sig Dispense Refill  . allopurinol (ZYLOPRIM) 300 MG tablet Take 300 mg by mouth daily.     Marland Kitchen amiodarone (PACERONE) 200 MG tablet Take 1 tablet (200 mg total) by mouth daily. 30 tablet 3  . amoxicillin (AMOXIL) 500 MG capsule Take 500 mg by mouth 3 (three) times a week.    Marland Kitchen aspirin EC 81 MG tablet Take 1 tablet (81 mg total) by mouth daily.    . carvedilol (COREG) 3.125 MG tablet Take 3.125 mg by mouth 2 (two) times daily with a meal.    . colchicine 0.6 MG tablet Take 0.6 mg by mouth daily as needed (gout pain). Reported on 12/21/2014    . donepezil (ARICEPT) 23 MG TABS tablet Take 23 mg by mouth 2 (two) times daily.    . fluticasone (FLONASE) 50 MCG/ACT nasal spray Place 2 sprays into both nostrils daily as needed for allergies or rhinitis. Reported on 12/21/2014    . furosemide (LASIX) 40 MG tablet Take 1 tablet (40 mg total) by mouth  daily. If weight is below 164lbs take 40mg  every other day. 30 tablet 3  . lisinopril (PRINIVIL,ZESTRIL) 10 MG tablet Take 0.5 tablets (5 mg total) by mouth 2 (two) times daily. 30 tablet 4  . LORazepam (ATIVAN) 0.5 MG tablet Take 0.5 mg by mouth at bedtime.    . memantine (NAMENDA XR) 28 MG CP24 24 hr capsule Take 1 capsule (28 mg total) by mouth daily. 30 capsule 6  . metolazone (ZAROXOLYN) 2.5 MG tablet Take 1 tablet (2.5 mg total) by mouth daily. As needed for weight gain 30 tablet 3  . rosuvastatin (CRESTOR) 10 MG tablet Take 5 mg by mouth daily. Reported on 03/22/2015    . spironolactone (ALDACTONE) 25 MG tablet Take 0.5 tablets (12.5 mg total) by mouth daily. 30 tablet 3  .  thiamine (VITAMIN B-1) 100 MG tablet Take 1 tablet (100 mg total) by mouth 2 (two) times daily. 60 tablet 6  . traZODone (DESYREL) 100 MG tablet Take 450 mg by mouth at bedtime.   2  . warfarin (COUMADIN) 2 MG tablet Take 1-1.5 tablets (2-3 mg total) by mouth daily at 6 PM. Take 2 mg (1 tablet) daily except on Sunday, Tuesday, and Friday. Take 3 mg (1.5 tablets) on Sunday, Tuesday, and Friday     No current facility-administered medications for this encounter.    Vitals:   11/16/15 1029  BP: (!) 96/56  Pulse: 86  SpO2: 96%  Weight: 172 lb 3.2 oz (78.1 kg)     PHYSICAL EXAM: General:  Elderly. No resp difficulty Wife and daughter present.  HEENT: normal Neck: supple. JVP ~10 cm.  Carotids 2+ bilaterally; no bruits. No lymphadenopathy or thryomegaly appreciated. Cor: PMI normal. RRR. Mechanical s2 No rubs, gallops 2/6 TR. Lungs: clear  Abdomen: soft, nontender,no distention. No HSM. No bruits or masses. Good bowel sounds. Extremities: no cyanosis, clubbing, rash, R and LLE 1+  edema Neuro: alert & orientedx3, cranial nerves grossly intact. Moves all 4 extremities w/o difficulty. Affect normal  ASSESSMENT & PLAN: 1. Chronic Systolic Heart Failure TEE with EF 15-20%  Up to 25-30% in 3/17  S/P Medtronic CRT-D 09/2012  NYHA III- Volume status mildly elevated. Instructed to take lasix 80 mg today and tomorrow then take 40 mg daily. Aim to keep weight less than 171 pounds.  -Can cut to every other day if weight drops below 164 Continue current dose of bb, ace, and spiro. Considered entresto however SBP low so will consider at next visit.  2. A Fib-  S/P DC-CV 12/20/12 but reverted to AF. Had AFL in 11/1 and paced out.  - Developed recurrent AF with loss of biv pacing in late August and early September noted on device interrogation. Continue amio at 200 daily.  - Continue coumadin for AF and AVR.   - 3. CKD stage 3- stable on recent labs. Recheck today 4. Alcohol- Reinforced alcohol  abstinence especially with coumadin.  5. Memory loss-  Neurology following. On namenda and aricept..  6. Mechanical AVR - on coumadin. SBE prophylaxis for all procedures.   Follow up 4 weeks with Dr Donnamae Jude, NP-C  10:19 AM

## 2015-11-16 NOTE — Patient Instructions (Signed)
You are to take 80 mg of Furosamide today Friday 11/10 and Saturday 11/11.  Then resume your current dose of 40 mg on Sunday 11/12.    Follow up in 4 weeks

## 2015-11-17 ENCOUNTER — Other Ambulatory Visit (HOSPITAL_COMMUNITY): Payer: Self-pay | Admitting: Internal Medicine

## 2015-11-19 ENCOUNTER — Other Ambulatory Visit (HOSPITAL_COMMUNITY): Payer: Self-pay | Admitting: Internal Medicine

## 2015-11-22 ENCOUNTER — Encounter (HOSPITAL_COMMUNITY): Payer: Medicare Other | Admitting: Internal Medicine

## 2015-11-23 ENCOUNTER — Other Ambulatory Visit (HOSPITAL_COMMUNITY): Payer: Self-pay | Admitting: *Deleted

## 2015-12-05 ENCOUNTER — Ambulatory Visit (INDEPENDENT_AMBULATORY_CARE_PROVIDER_SITE_OTHER): Payer: Medicare Other | Admitting: *Deleted

## 2015-12-05 ENCOUNTER — Encounter: Payer: Self-pay | Admitting: Internal Medicine

## 2015-12-05 ENCOUNTER — Encounter (INDEPENDENT_AMBULATORY_CARE_PROVIDER_SITE_OTHER): Payer: Self-pay

## 2015-12-05 ENCOUNTER — Ambulatory Visit (INDEPENDENT_AMBULATORY_CARE_PROVIDER_SITE_OTHER): Payer: Medicare Other | Admitting: Internal Medicine

## 2015-12-05 VITALS — BP 90/60 | HR 67 | Ht 69.0 in | Wt 172.1 lb

## 2015-12-05 DIAGNOSIS — I5023 Acute on chronic systolic (congestive) heart failure: Secondary | ICD-10-CM | POA: Diagnosis not present

## 2015-12-05 DIAGNOSIS — Z95810 Presence of automatic (implantable) cardiac defibrillator: Secondary | ICD-10-CM

## 2015-12-05 DIAGNOSIS — I482 Chronic atrial fibrillation, unspecified: Secondary | ICD-10-CM

## 2015-12-05 DIAGNOSIS — Z7901 Long term (current) use of anticoagulants: Secondary | ICD-10-CM

## 2015-12-05 DIAGNOSIS — I4891 Unspecified atrial fibrillation: Secondary | ICD-10-CM

## 2015-12-05 DIAGNOSIS — Z5181 Encounter for therapeutic drug level monitoring: Secondary | ICD-10-CM

## 2015-12-05 LAB — CUP PACEART INCLINIC DEVICE CHECK
Battery Remaining Longevity: 52 mo
Battery Voltage: 2.97 V
Brady Statistic AP VP Percent: 74.5 %
Brady Statistic AS VS Percent: 1.51 %
Date Time Interrogation Session: 20171129172008
HIGH POWER IMPEDANCE MEASURED VALUE: 39 Ohm
HIGH POWER IMPEDANCE MEASURED VALUE: 48 Ohm
Implantable Lead Implant Date: 20090326
Implantable Lead Implant Date: 20090326
Implantable Lead Location: 753858
Implantable Lead Model: 6947
Implantable Pulse Generator Implant Date: 20140915
Lead Channel Impedance Value: 304 Ohm
Lead Channel Impedance Value: 399 Ohm
Lead Channel Pacing Threshold Amplitude: 0.75 V
Lead Channel Pacing Threshold Amplitude: 0.75 V
Lead Channel Pacing Threshold Pulse Width: 0.4 ms
Lead Channel Pacing Threshold Pulse Width: 0.4 ms
Lead Channel Sensing Intrinsic Amplitude: 15.1 mV
Lead Channel Setting Pacing Amplitude: 2 V
Lead Channel Setting Pacing Amplitude: 2 V
Lead Channel Setting Pacing Pulse Width: 0.4 ms
Lead Channel Setting Pacing Pulse Width: 0.4 ms
MDC IDC LEAD IMPLANT DT: 20090326
MDC IDC LEAD LOCATION: 753859
MDC IDC LEAD LOCATION: 753860
MDC IDC LEAD MODEL: 4194
MDC IDC MSMT LEADCHNL LV IMPEDANCE VALUE: 304 Ohm
MDC IDC MSMT LEADCHNL LV IMPEDANCE VALUE: 456 Ohm
MDC IDC MSMT LEADCHNL LV IMPEDANCE VALUE: 608 Ohm
MDC IDC MSMT LEADCHNL RA IMPEDANCE VALUE: 399 Ohm
MDC IDC MSMT LEADCHNL RA PACING THRESHOLD AMPLITUDE: 1 V
MDC IDC MSMT LEADCHNL RA PACING THRESHOLD PULSEWIDTH: 0.4 ms
MDC IDC MSMT LEADCHNL RA SENSING INTR AMPL: 0.4 mV
MDC IDC SET LEADCHNL RV PACING AMPLITUDE: 2.5 V
MDC IDC SET LEADCHNL RV SENSING SENSITIVITY: 0.6 mV
MDC IDC STAT BRADY AP VS PERCENT: 1.21 %
MDC IDC STAT BRADY AS VP PERCENT: 22.78 %
MDC IDC STAT BRADY RA PERCENT PACED: 70.92 %
MDC IDC STAT BRADY RV PERCENT PACED: 80.45 %

## 2015-12-05 LAB — POCT INR: INR: 2.8

## 2015-12-05 NOTE — Patient Instructions (Signed)
Medication Instructions:  Your physician recommends that you continue on your current medications as directed. Please refer to the Current Medication list given to you today.   Labwork: None Ordered   Testing/Procedures: None Ordered   Follow-Up: Your physician wants you to follow-up in: 1 year with Dr. Lovena Le. You will receive a reminder letter in the mail two months in advance. If you don't receive a letter, please call our office to schedule the follow-up appointment.   Remote monitoring is used to monitor your ICD from home. This monitoring reduces the number of office visits required to check your device to one time per year. It allows [place] to keep an eye on the functioning of your device to ensure it is working properly. You are scheduled for a device check from home on 03/05/16. You may send your transmission at any time that day. If you have a wireless device, the transmission will be sent automatically. After your physician reviews your transmission, you will receive a postcard with your next transmission date.     Any Other Special Instructions Will Be Listed Below (If Applicable).     If you need a refill on your cardiac medications before your next appointment, please call your pharmacy.

## 2015-12-05 NOTE — Progress Notes (Signed)
5      HPI Jon Butler returns today for arrhythmia followup. He is a very pleasant 80 year old man with a nonischemic cardiomyopathy, chronic systolic heart failure , status post aortic valve replacement , and atrial fibrillation and flutter.  He denies noncompliance. Previously he had a history of noncompliance along with alcohol abuse. He denies alcohol abuse. He is been maintained on amiodarone 200 mg daily. His tachy therapies have been turned off. He has not had syncope. He admits to a sedentary lifestyle.  No Known Allergies   Current Outpatient Prescriptions  Medication Sig Dispense Refill  . allopurinol (ZYLOPRIM) 300 MG tablet Take 300 mg by mouth daily.     Marland Kitchen amiodarone (PACERONE) 200 MG tablet Take 1 tablet (200 mg total) by mouth daily. 30 tablet 3  . amoxicillin (AMOXIL) 500 MG capsule Take 500 mg by mouth 3 (three) times a week.    Marland Kitchen aspirin EC 81 MG tablet Take 1 tablet (81 mg total) by mouth daily.    . carvedilol (COREG) 3.125 MG tablet Take 3.125 mg by mouth 2 (two) times daily with a meal.    . colchicine 0.6 MG tablet Take 0.6 mg by mouth daily as needed (gout pain). Reported on 12/21/2014    . donepezil (ARICEPT) 23 MG TABS tablet Take 23 mg by mouth 2 (two) times daily.    . fluticasone (FLONASE) 50 MCG/ACT nasal spray Place 2 sprays into both nostrils daily as needed for allergies or rhinitis. Reported on 12/21/2014    . furosemide (LASIX) 40 MG tablet Take 1 tablet (40 mg total) by mouth daily. If weight is below 164lbs take 40mg  every other day. 30 tablet 3  . lisinopril (PRINIVIL,ZESTRIL) 10 MG tablet Take 0.5 tablets (5 mg total) by mouth daily. 45 tablet 3  . LORazepam (ATIVAN) 0.5 MG tablet Take 0.5 mg by mouth at bedtime.    . memantine (NAMENDA) 10 MG tablet Take 1 tablet by mouth 2 (two) times daily.  2  . metolazone (ZAROXOLYN) 2.5 MG tablet Take 1 tablet (2.5 mg total) by mouth daily. As needed for weight gain 30 tablet 3  . rosuvastatin (CRESTOR) 10 MG  tablet Take 5 mg by mouth daily. Reported on 03/22/2015    . spironolactone (ALDACTONE) 25 MG tablet Take 0.5 tablets (12.5 mg total) by mouth daily. 30 tablet 3  . thiamine (VITAMIN B-1) 100 MG tablet Take 1 tablet (100 mg total) by mouth 2 (two) times daily. 60 tablet 6  . traZODone (DESYREL) 100 MG tablet Take 400 mg by mouth at bedtime.   2  . warfarin (COUMADIN) 2 MG tablet Take 1-1.5 tablets (2-3 mg total) by mouth daily at 6 PM. Take 2 mg (1 tablet) daily except on Sunday, Tuesday, and Friday. Take 3 mg (1.5 tablets) on Sunday, Tuesday, and Friday     No current facility-administered medications for this visit.      Past Medical History:  Diagnosis Date  . Atrial fibrillation ([place])   . Cardiomyopathy   . Chronic systolic heart failure ([place])   . Dementia   . Left bundle-branch block   . Pneumonia 08/2014    ROS:   All systems reviewed and negative except as noted in the HPI.   Past Surgical History:  Procedure Laterality Date  . AORTIC VALVE REPLACEMENT    . BOTOX INJECTION N/A 08/18/2014   Procedure: BOTOX INJECTION;  Surgeon: Wonda Horner, MD;  Location: [facility] ENDOSCOPY;  Service: Endoscopy;  Laterality: N/A;  .  CARDIOVERSION N/A 10/25/2012   Procedure: CARDIOVERSION;  Surgeon: Evans Lance, MD;  Location: [place];  Service: Cardiovascular;  Laterality: N/A;  . CARDIOVERSION N/A 12/20/2012   Procedure: CARDIOVERSION;  Surgeon: Larey Dresser, MD;  Location: [place];  Service: Cardiovascular;  Laterality: N/A;  . CARDIOVERSION N/A 08/10/2014   Procedure: CARDIOVERSION;  Surgeon: Sanda Klein, MD;  Location: [facility] ENDOSCOPY;  Service: Cardiovascular;  Laterality: N/A;  . DOPPLER ECHOCARDIOGRAPHY  2008, 2009  . ESOPHAGOGASTRODUODENOSCOPY (EGD) WITH PROPOFOL N/A 08/18/2014   Procedure: ESOPHAGOGASTRODUODENOSCOPY (EGD) WITH PROPOFOL;  Surgeon: Wonda Horner, MD;  Location: [facility] ENDOSCOPY;  Service: Endoscopy;  Laterality: N/A;  . IMPLANTABLE CARDIOVERTER DEFIBRILLATOR GENERATOR  CHANGE N/A 09/20/2012   Procedure: IMPLANTABLE CARDIOVERTER DEFIBRILLATOR GENERATOR CHANGE;  Surgeon: Evans Lance, MD;  Location: [facility] CATH LAB;  Service: Cardiovascular;  Laterality: N/A;  . TEE WITHOUT CARDIOVERSION N/A 10/15/2012   Procedure: TRANSESOPHAGEAL ECHOCARDIOGRAM (TEE);  Surgeon: Fay Records, MD;  Location: [facility] ENDOSCOPY;  Service: Cardiovascular;  Laterality: N/A;  . TEE WITHOUT CARDIOVERSION N/A 08/10/2014   Procedure: TRANSESOPHAGEAL ECHOCARDIOGRAM (TEE);  Surgeon: Sanda Klein, MD;  Location: [facility] ENDOSCOPY;  Service: Cardiovascular;  Laterality: N/A;     Family History  Problem Relation Age of Onset  . Aneurysm Mother     brain  . Heart attack Father 98     Social History   Social History  . Marital status: Married    Spouse name: [name]  . Number of children: 5  . Years of education: 12   Occupational History  . retired Retired   Social History Main Topics  . Smoking status: Never Smoker  . Smokeless tobacco: Never Used  . Alcohol use 0.0 oz/week    2 - 3 Glasses of wine per week     Comment: 2-3 glasses  . Drug use: No  . Sexual activity: No   Other Topics Concern  . Not on file   Social History Narrative   Patient is married [name]).   Patient is retired.   5 children    12th grade   2 cups caffeine daily     BP 90/60   Pulse 67   Ht 5\' 9"  (1.753 m)   Wt 172 lb 1.9 oz (78.1 kg)   BMI 25.42 kg/m   Physical Exam:  stable appearing 80 year old man, NAD, blunted affect HEENT: Unremarkable Neck:  7 cm JVD, no thyromegally Back:  No CVA tenderness Lungs:  Clear with no wheezes, rales, or rhonchi. HEART:  Regular rate rhythm, no murmurs, no rubs, no clicks Abd:  soft, positive bowel sounds, no organomegally, no rebound, no guarding Ext:  2 plus pulses, no edema, no cyanosis, no clubbing Skin:  No rashes no nodules Neuro:  CN II through XII intact, motor grossly intact   DEVICE  Normal device function.  See PaceArt for details.  NSR  Assess/Plan: 1. Chronic systolic heart failure - he is class 2. He is mostly low output. I have encouraged him to increase his physical activity. 2. Atrial fib - he is maintaining NSR. He will continue amiodarone. 3. ICD - his device is working normally. Will recheck in several months. 4. HTN - his blood pressure is controlled. Will follow.  Mikle Bosworth.D.

## 2015-12-15 DIAGNOSIS — F0280 Dementia in other diseases classified elsewhere without behavioral disturbance: Secondary | ICD-10-CM | POA: Diagnosis not present

## 2015-12-15 DIAGNOSIS — G309 Alzheimer's disease, unspecified: Secondary | ICD-10-CM | POA: Diagnosis not present

## 2015-12-18 ENCOUNTER — Ambulatory Visit (HOSPITAL_COMMUNITY)
Admission: RE | Admit: 2015-12-18 | Discharge: 2015-12-18 | Disposition: A | Discharge status: Home / Self Care | Payer: Medicare Other | Source: Ambulatory Visit | Attending: Cardiology | Admitting: Cardiology

## 2015-12-18 ENCOUNTER — Telehealth: Payer: Self-pay

## 2015-12-18 ENCOUNTER — Ambulatory Visit (INDEPENDENT_AMBULATORY_CARE_PROVIDER_SITE_OTHER): Payer: Medicare Other

## 2015-12-18 ENCOUNTER — Encounter (HOSPITAL_COMMUNITY): Payer: Self-pay

## 2015-12-18 VITALS — BP 116/78 | HR 84 | Wt 173.5 lb

## 2015-12-18 DIAGNOSIS — I5022 Chronic systolic (congestive) heart failure: Secondary | ICD-10-CM

## 2015-12-18 DIAGNOSIS — I480 Paroxysmal atrial fibrillation: Secondary | ICD-10-CM | POA: Diagnosis not present

## 2015-12-18 DIAGNOSIS — Z95810 Presence of automatic (implantable) cardiac defibrillator: Secondary | ICD-10-CM

## 2015-12-18 LAB — CBC
HEMATOCRIT: 40 % (ref 39.0–52.0)
HEMOGLOBIN: 12.8 g/dL — AB (ref 13.0–17.0)
MCH: 30.5 pg (ref 26.0–34.0)
MCHC: 32 g/dL (ref 30.0–36.0)
MCV: 95.2 fL (ref 78.0–100.0)
Platelets: 120 10*3/uL — ABNORMAL LOW (ref 150–400)
RBC: 4.2 MIL/uL — ABNORMAL LOW (ref 4.22–5.81)
RDW: 18.1 % — AB (ref 11.5–15.5)
WBC: 5.7 10*3/uL (ref 4.0–10.5)

## 2015-12-18 LAB — COMPREHENSIVE METABOLIC PANEL
ALBUMIN: 3.5 g/dL (ref 3.5–5.0)
ALT: 20 U/L (ref 17–63)
ANION GAP: 9 (ref 5–15)
AST: 24 U/L (ref 15–41)
Alkaline Phosphatase: 100 U/L (ref 38–126)
BUN: 24 mg/dL — AB (ref 6–20)
CHLORIDE: 104 mmol/L (ref 101–111)
CO2: 27 mmol/L (ref 22–32)
Calcium: 8.9 mg/dL (ref 8.9–10.3)
Creatinine, Ser: 1.1 mg/dL (ref 0.61–1.24)
GFR calc Af Amer: >60 mL/min (ref >60)
GFR calc non Af Amer: >60 mL/min (ref >60)
GLUCOSE: 104 mg/dL — AB (ref 65–99)
POTASSIUM: 4.8 mmol/L (ref 3.5–5.1)
SODIUM: 140 mmol/L (ref 135–145)
TOTAL PROTEIN: 6.3 g/dL — AB (ref 6.5–8.1)
Total Bilirubin: 0.7 mg/dL (ref 0.3–1.2)

## 2015-12-18 LAB — T4, FREE: Free T4: 1.1 ng/dL (ref 0.61–1.12)

## 2015-12-18 LAB — TSH: TSH: 5.166 u[IU]/mL — ABNORMAL HIGH (ref 0.350–4.500)

## 2015-12-18 MED ORDER — POTASSIUM CHLORIDE CRYS ER 20 MEQ PO TBCR: 20.0000 meq | EXTENDED_RELEASE_TABLET | ORAL | 3 refills | Status: DC | PRN

## 2015-12-18 MED ORDER — METOLAZONE 2.5 MG PO TABS: ORAL_TABLET | ORAL | 3 refills | Status: DC

## 2015-12-18 NOTE — Telephone Encounter (Signed)
Remote ICM transmission received.  Attempted call to wife and no message left.

## 2015-12-18 NOTE — Patient Instructions (Signed)
Stop Lisinopril  Start Losartan 25 mg daily (this replaces the Lisinopril)   Continue taking Furosemide 40 mg daily  Take Metolazone 2.5 mg TODAY, then take if every Friday IF YOUR WEIGHT IS 170 LB OR MORE  When you take Metolazone take Potassium 20 meq (1 tab)  Labs today  Your physician recommends that you schedule a follow-up appointment in: 6 weeks

## 2015-12-18 NOTE — Progress Notes (Signed)
Advanced Heart Failure Note   Patient ID: Jon Butler, male   DOB: June 18, 1935, 80 y.o.   MRN: [id number] PCP: Dr Lysle Rubens EP: Dr [facility] HF: Rosalynn Sergent  HPI: Mr. Sein is a 80 yo male with a history of chronic systolic HF EF 123456 due to NICM s/p CRT-D (Medtronic), mechanical AVR (St Jude), LBBB, PAF S/P DC-CV 12/20/12, ETOH abuse and dementia and non-compliance.   Admitted to [facility] 10/10 through 10/26/12 with ADHF in setting of recurrent AF. Developed cardiogenic shock and VDRF   BB and ACE-I held and amiodarone gtt was started. Required short term Milrinone and lasix.Diuresed 26 pounds and transitioned to 40 mg daily. DC-CV on 10/20. Remains on Coumadin. D/C weight 206 pounds.   S/P successful DC-CV 12/20/12. Amio increased to 400 bid.   Has seen Dr. Sherron Flemings in Neurology for progressive cognitive decline in setting of ETOH abuse. Also had foot drop.   Admitted in 8/16 for respiratory failure due to PNA and HF in setting of recurrent AF. Underwent TEE/DC-CV with nearly immediate reversion to AF.Seen by EP and felt to have end-stage HF and rate control strategy recommended. Amio stopped. Also underwent botox injection in esophagus for esophageal spasm/dysphagia. Made DNR and ICD deactivated. Weight at home 193-194 (was discharged at 191 pound)   Saw Dr. Lovena Le in November 2016 and was in AFL paced back into NSR.  Admitted in March 2017 with pneumonia and possible sepsis. Treated abx.   Today he returns for regular f/u. Seen recently for volume overload.  Was given lasix 80 x 1 and told to take lasix 40mg  daily. Weight down from 176 to 171 at home. Says he feels fine. Denies dyspnea or orthopnea. But wife says he sleeps propped up. + edema. Says memory is ok but wife says he continues to struggle with memory.  Echo 3/17 25-30%   11/01/12 Labs INR > 8 10/26/12 K 3.0 Creatinine 1.23  12/16/12 K 4.6 Creatinine 1.05 12/20/12 K 4.0 Creatinine 1.04 11/15      K 4.4 Creatinine  1.1 5/16: K 4.3 Creatinine 0.94  6/16: INR 1.9  09/07/14: K 4.0, Cr 1.00 01/05/15 K 4.7 creatinine 0.9    ROS: All systems negative except as listed in HPI, PMH and Problem List.  Past Medical History:  Diagnosis Date  . Atrial fibrillation ([place])   . Cardiomyopathy   . Chronic systolic heart failure ([place])   . Dementia   . Left bundle-branch block   . Pneumonia 08/2014    Current Outpatient Prescriptions  Medication Sig Dispense Refill  . allopurinol (ZYLOPRIM) 300 MG tablet Take 300 mg by mouth daily.     Marland Kitchen amiodarone (PACERONE) 200 MG tablet Take 1 tablet (200 mg total) by mouth daily. 30 tablet 3  . amoxicillin (AMOXIL) 500 MG capsule Take 500 mg by mouth 3 (three) times a week.    Marland Kitchen aspirin EC 81 MG tablet Take 1 tablet (81 mg total) by mouth daily.    . carvedilol (COREG) 3.125 MG tablet Take 3.125 mg by mouth 2 (two) times daily with a meal.    . colchicine 0.6 MG tablet Take 0.6 mg by mouth daily as needed (gout pain). Reported on 12/21/2014    . donepezil (ARICEPT) 23 MG TABS tablet Take 23 mg by mouth 2 (two) times daily.    . fluticasone (FLONASE) 50 MCG/ACT nasal spray Place 2 sprays into both nostrils daily as needed for allergies or rhinitis. Reported on 12/21/2014    .  furosemide (LASIX) 40 MG tablet Take 1 tablet (40 mg total) by mouth daily. If weight is below 164lbs take 40mg  every other day. 30 tablet 3  . lisinopril (PRINIVIL,ZESTRIL) 10 MG tablet Take 0.5 tablets (5 mg total) by mouth daily. 45 tablet 3  . LORazepam (ATIVAN) 0.5 MG tablet Take 0.5 mg by mouth at bedtime.    . memantine (NAMENDA) 10 MG tablet Take 1 tablet by mouth 2 (two) times daily.  2  . rosuvastatin (CRESTOR) 10 MG tablet Take 5 mg by mouth daily. Reported on 03/22/2015    . spironolactone (ALDACTONE) 25 MG tablet Take 0.5 tablets (12.5 mg total) by mouth daily. 30 tablet 3  . thiamine (VITAMIN B-1) 100 MG tablet Take 1 tablet (100 mg total) by mouth 2 (two) times daily. 60 tablet 6  .  traZODone (DESYREL) 100 MG tablet Take 400 mg by mouth at bedtime.   2  . warfarin (COUMADIN) 2 MG tablet Take 1-1.5 tablets (2-3 mg total) by mouth daily at 6 PM. Take 2 mg (1 tablet) daily except on Sunday, Tuesday, and Friday. Take 3 mg (1.5 tablets) on Sunday, Tuesday, and Friday    . metolazone (ZAROXOLYN) 2.5 MG tablet Take 1 tablet (2.5 mg total) by mouth daily. As needed for weight gain (Patient not taking: Reported on 12/18/2015) 30 tablet 3   No current facility-administered medications for this encounter.    Vitals:   12/18/15 1434  BP: 116/78  Pulse: 84  SpO2: 97%  Weight: 173 lb 8 oz (78.7 kg)   Wt Readings from Last 3 Encounters:  12/18/15 173 lb 8 oz (78.7 kg)  12/05/15 172 lb 1.9 oz (78.1 kg)  11/16/15 172 lb 3.2 oz (78.1 kg)     PHYSICAL EXAM: General:  Elderly. No resp difficulty Wife present.  HEENT: normal Neck: supple. JVP ~9-10 cm.  Carotids 2+ bilaterally; no bruits. No lymphadenopathy or thryomegaly appreciated. Cor: PMI normal. RRR. Mechanical s2 No rubs, gallops 2/6 TR. Lungs: clear  Abdomen: soft, nontender,no distention. No HSM. No bruits or masses. Good bowel sounds. Extremities: no cyanosis, clubbing, rash, R and LLE 1-2+  edema Neuro: alert & orientedx3, cranial nerves grossly intact. Moves all 4 extremities w/o difficulty. Affect flat  ICD interrogation done personally. Volume mildly elevated. No VT or AF. Activity level very close to 0.   ASSESSMENT & PLAN: 1. Chronic Systolic Heart Failure TEE with EF 15-20%  Up to 25-30% in 3/17  S/P Medtronic CRT-D 09/2012  NYHA III- Volume status remains mildly elevated on exam and by ICD interrogation. Continue to take lasix 40 mg daily. Aim to keep weight 165-171 pounds.  -Will have him take metolazone 2.5 mg today with Kcl 20 (and every Friday unless weight less than 170) Continue current dose of bb, ace, and spiro. BP a bit soft to consider Entresto. However will switch lisinopril over to losartan 25 daily  in preparation for possible switch to [facility].  -Encouraged him to walk around the house several times per day to increase activity level   2. A Fib-  S/P DC-CV 12/20/12 but reverted to AF. Had AFL in 11/16 and paced out. I very symptomatic with AF and loss of biv pacing - Remains in NSR today. No recent AF on ICd. Continue amio at 200 daily.  - Continue coumadin for AF and AVR.   3. CKD stage 3- stable on recent labs. Recheck today 4. Alcohol- Reinforced alcohol abstinence especially with coumadin.  5. Memory loss-  Neurology following. On namenda and aricept. Seems a bit worse today.  6. Mechanical AVR - on coumadin. SBE prophylaxis for all procedures.    Glori Bickers, MD 2:48 PM

## 2015-12-18 NOTE — Progress Notes (Signed)
EPIC Encounter for ICM Monitoring  Patient Name: Jon Butler is a 80 y.o. male Date: 12/18/2015 Primary Care Physican: [name], MD Primary Cardiologist: [place] Electrophysiologist: Lovena Le Dry Weight:   unknown Bi-V Pacing:  76.7%    Attempted ICM call to wife and unable to reach.  Transmission reviewed.   Thoracic impedance normal for last 2 days.    Recommendations:  Patient seen in HF clinic today, 12/18/2015.  Per today's office note, volume status remains mildly elevated.  He is to continue to take Lasix 40 mg and keep weight 165-171 pounds.  Ordered to take Metolazone 2.5 mg every Friday with KCL 20 unless weight is <170 lbs.   Follow-up plan: ICM clinic phone appointment on 01/02/2017.  Copy of ICM check sent to device physician.   ICM trend: 12/18/2015       Rosalene Billings, RN 12/18/2015 5:30 PM

## 2015-12-19 ENCOUNTER — Telehealth: Payer: Self-pay | Admitting: Internal Medicine

## 2015-12-19 DIAGNOSIS — F0280 Dementia in other diseases classified elsewhere without behavioral disturbance: Secondary | ICD-10-CM | POA: Diagnosis not present

## 2015-12-19 DIAGNOSIS — G309 Alzheimer's disease, unspecified: Secondary | ICD-10-CM | POA: Diagnosis not present

## 2015-12-19 NOTE — Telephone Encounter (Signed)
Pt wife is calling you back.

## 2015-12-20 NOTE — Telephone Encounter (Signed)
Spoke with patient.

## 2015-12-21 DIAGNOSIS — F0280 Dementia in other diseases classified elsewhere without behavioral disturbance: Secondary | ICD-10-CM | POA: Diagnosis not present

## 2015-12-21 DIAGNOSIS — G309 Alzheimer's disease, unspecified: Secondary | ICD-10-CM | POA: Diagnosis not present

## 2015-12-24 DIAGNOSIS — F0280 Dementia in other diseases classified elsewhere without behavioral disturbance: Secondary | ICD-10-CM | POA: Diagnosis not present

## 2015-12-24 DIAGNOSIS — G309 Alzheimer's disease, unspecified: Secondary | ICD-10-CM | POA: Diagnosis not present

## 2015-12-25 ENCOUNTER — Other Ambulatory Visit (HOSPITAL_COMMUNITY): Payer: Self-pay | Admitting: *Deleted

## 2015-12-25 MED ORDER — LOSARTAN POTASSIUM 25 MG PO TABS: 25.0000 mg | ORAL_TABLET | Freq: Every day | ORAL | 3 refills | Status: DC

## 2015-12-27 DIAGNOSIS — G309 Alzheimer's disease, unspecified: Secondary | ICD-10-CM | POA: Diagnosis not present

## 2015-12-27 DIAGNOSIS — F0280 Dementia in other diseases classified elsewhere without behavioral disturbance: Secondary | ICD-10-CM | POA: Diagnosis not present

## 2015-12-28 DIAGNOSIS — F0280 Dementia in other diseases classified elsewhere without behavioral disturbance: Secondary | ICD-10-CM | POA: Diagnosis not present

## 2015-12-28 DIAGNOSIS — G309 Alzheimer's disease, unspecified: Secondary | ICD-10-CM | POA: Diagnosis not present

## 2016-01-01 DIAGNOSIS — G309 Alzheimer's disease, unspecified: Secondary | ICD-10-CM | POA: Diagnosis not present

## 2016-01-01 DIAGNOSIS — F0280 Dementia in other diseases classified elsewhere without behavioral disturbance: Secondary | ICD-10-CM | POA: Diagnosis not present

## 2016-01-02 ENCOUNTER — Ambulatory Visit (INDEPENDENT_AMBULATORY_CARE_PROVIDER_SITE_OTHER): Payer: Medicare Other | Admitting: Internal Medicine

## 2016-01-02 DIAGNOSIS — Z5181 Encounter for therapeutic drug level monitoring: Secondary | ICD-10-CM

## 2016-01-02 DIAGNOSIS — F0280 Dementia in other diseases classified elsewhere without behavioral disturbance: Secondary | ICD-10-CM | POA: Diagnosis not present

## 2016-01-02 DIAGNOSIS — Z95810 Presence of automatic (implantable) cardiac defibrillator: Secondary | ICD-10-CM

## 2016-01-02 DIAGNOSIS — G309 Alzheimer's disease, unspecified: Secondary | ICD-10-CM | POA: Diagnosis not present

## 2016-01-02 DIAGNOSIS — I480 Paroxysmal atrial fibrillation: Secondary | ICD-10-CM

## 2016-01-02 LAB — POCT INR: INR: 3.1

## 2016-01-03 ENCOUNTER — Ambulatory Visit (INDEPENDENT_AMBULATORY_CARE_PROVIDER_SITE_OTHER): Payer: Medicare Other

## 2016-01-03 DIAGNOSIS — Z95810 Presence of automatic (implantable) cardiac defibrillator: Secondary | ICD-10-CM | POA: Diagnosis not present

## 2016-01-03 DIAGNOSIS — F0280 Dementia in other diseases classified elsewhere without behavioral disturbance: Secondary | ICD-10-CM | POA: Diagnosis not present

## 2016-01-03 DIAGNOSIS — I5022 Chronic systolic (congestive) heart failure: Secondary | ICD-10-CM | POA: Diagnosis not present

## 2016-01-03 DIAGNOSIS — G309 Alzheimer's disease, unspecified: Secondary | ICD-10-CM | POA: Diagnosis not present

## 2016-01-03 NOTE — Progress Notes (Signed)
EPIC Encounter for ICM Monitoring  Patient Name: Jon Butler is a 80 y.o. male Date: 01/03/2016 Primary Care Physican: [name], MD Primary Cardiologist:[place] Electrophysiologist: Lovena Le Dry Weight:172 lbs Bi-V Pacing: 77.8%  Clinical Status (18-Dec-2015 to 03-Jan-2016) AT/AF 1 episode Time in AT/AF <0.1 hr/day (<0.1%)       Spoke with wife.  Heart Failure questions reviewed, pt asymptomatic.  She reported the goal for his baseline weight 168 lbs   Thoracic impedance normal today.  Labs: 12/18/2015 Creatinine 1.10, BUN 24, Potassium 4.8, Sodium 140, EGFR >60 09/27/2015 Creatinine 0.96, BUN 19, Potassium 4.4, Sodium 139, EGFR >60  07/27/2015 Creatinine 0.98, BUN 14, Potassium 5.2, Sodium 136, EGFR >60  04/07/2015 Creatinine 0.71, BUN 17, Potassium 3.8, Sodium 141, EGFR >60  04/06/2015 Creatinine 0.70, BUN 20, Potassium 4.4, Sodium 141, EGFR >60  04/05/2015 Creatinine 0.78, BUN 32, Potassium 4.4, Sodium 140, EGFR >60  04/04/2015 Creatinine 1.07, BUN 45, Potassium 5.0, Sodium 140, EGFR >60  03/22/2015 Creatinine 1.05, BUN 22, Potassium 4.8, Sodium 142, EGFR >60   Recommendations: No changes.  Reinforced to limit low salt food choices to 2000 mg day and limiting fluid intake to < 2 liters per day.  She reported they have been working on cutting down salt in diet.  Encouraged to call for fluid symptoms.    Follow-up plan: ICM clinic phone appointment on 02/05/2016 and HF clinic appt on 01/30/2016  Copy of ICM check sent to device physician.   3 month ICM trend : 01/03/2016   1 Year ICM trend:      Rosalene Billings, RN 01/03/2016 10:15 AM

## 2016-01-04 DIAGNOSIS — G309 Alzheimer's disease, unspecified: Secondary | ICD-10-CM | POA: Diagnosis not present

## 2016-01-04 DIAGNOSIS — F0280 Dementia in other diseases classified elsewhere without behavioral disturbance: Secondary | ICD-10-CM | POA: Diagnosis not present

## 2016-01-07 DIAGNOSIS — F0280 Dementia in other diseases classified elsewhere without behavioral disturbance: Secondary | ICD-10-CM | POA: Diagnosis not present

## 2016-01-07 DIAGNOSIS — G309 Alzheimer's disease, unspecified: Secondary | ICD-10-CM | POA: Diagnosis not present

## 2016-01-09 DIAGNOSIS — F0280 Dementia in other diseases classified elsewhere without behavioral disturbance: Secondary | ICD-10-CM | POA: Diagnosis not present

## 2016-01-09 DIAGNOSIS — G309 Alzheimer's disease, unspecified: Secondary | ICD-10-CM | POA: Diagnosis not present

## 2016-01-14 DIAGNOSIS — F0280 Dementia in other diseases classified elsewhere without behavioral disturbance: Secondary | ICD-10-CM | POA: Diagnosis not present

## 2016-01-14 DIAGNOSIS — G309 Alzheimer's disease, unspecified: Secondary | ICD-10-CM | POA: Diagnosis not present

## 2016-01-16 ENCOUNTER — Ambulatory Visit (INDEPENDENT_AMBULATORY_CARE_PROVIDER_SITE_OTHER): Payer: Medicare Other | Admitting: Cardiovascular Disease

## 2016-01-16 DIAGNOSIS — G309 Alzheimer's disease, unspecified: Secondary | ICD-10-CM | POA: Diagnosis not present

## 2016-01-16 DIAGNOSIS — Z95810 Presence of automatic (implantable) cardiac defibrillator: Secondary | ICD-10-CM

## 2016-01-16 DIAGNOSIS — I480 Paroxysmal atrial fibrillation: Secondary | ICD-10-CM

## 2016-01-16 DIAGNOSIS — Z5181 Encounter for therapeutic drug level monitoring: Secondary | ICD-10-CM

## 2016-01-16 DIAGNOSIS — F0280 Dementia in other diseases classified elsewhere without behavioral disturbance: Secondary | ICD-10-CM | POA: Diagnosis not present

## 2016-01-16 LAB — POCT INR: INR: 4.7

## 2016-01-21 DIAGNOSIS — G309 Alzheimer's disease, unspecified: Secondary | ICD-10-CM | POA: Diagnosis not present

## 2016-01-21 DIAGNOSIS — F0280 Dementia in other diseases classified elsewhere without behavioral disturbance: Secondary | ICD-10-CM | POA: Diagnosis not present

## 2016-01-22 ENCOUNTER — Encounter (HOSPITAL_BASED_OUTPATIENT_CLINIC_OR_DEPARTMENT_OTHER): Payer: Medicare Other | Attending: Surgery

## 2016-01-25 ENCOUNTER — Ambulatory Visit (INDEPENDENT_AMBULATORY_CARE_PROVIDER_SITE_OTHER): Payer: Medicare Other | Admitting: Cardiology

## 2016-01-25 DIAGNOSIS — G309 Alzheimer's disease, unspecified: Secondary | ICD-10-CM | POA: Diagnosis not present

## 2016-01-25 DIAGNOSIS — Z5181 Encounter for therapeutic drug level monitoring: Secondary | ICD-10-CM

## 2016-01-25 DIAGNOSIS — F0280 Dementia in other diseases classified elsewhere without behavioral disturbance: Secondary | ICD-10-CM | POA: Diagnosis not present

## 2016-01-25 DIAGNOSIS — Z95810 Presence of automatic (implantable) cardiac defibrillator: Secondary | ICD-10-CM

## 2016-01-25 LAB — POCT INR: INR: 3.2

## 2016-01-28 ENCOUNTER — Other Ambulatory Visit (HOSPITAL_COMMUNITY): Payer: Self-pay | Admitting: *Deleted

## 2016-01-28 DIAGNOSIS — F0280 Dementia in other diseases classified elsewhere without behavioral disturbance: Secondary | ICD-10-CM | POA: Diagnosis not present

## 2016-01-28 DIAGNOSIS — G309 Alzheimer's disease, unspecified: Secondary | ICD-10-CM | POA: Diagnosis not present

## 2016-01-28 MED ORDER — AMIODARONE HCL 200 MG PO TABS: 200.0000 mg | ORAL_TABLET | Freq: Every day | ORAL | 3 refills | Status: DC

## 2016-01-30 ENCOUNTER — Encounter (HOSPITAL_COMMUNITY): Payer: Medicare Other | Admitting: Internal Medicine

## 2016-01-31 DIAGNOSIS — G309 Alzheimer's disease, unspecified: Secondary | ICD-10-CM | POA: Diagnosis not present

## 2016-01-31 DIAGNOSIS — F0280 Dementia in other diseases classified elsewhere without behavioral disturbance: Secondary | ICD-10-CM | POA: Diagnosis not present

## 2016-02-01 ENCOUNTER — Ambulatory Visit (INDEPENDENT_AMBULATORY_CARE_PROVIDER_SITE_OTHER): Payer: Medicare Other | Admitting: *Deleted

## 2016-02-01 DIAGNOSIS — I4891 Unspecified atrial fibrillation: Secondary | ICD-10-CM | POA: Diagnosis not present

## 2016-02-01 DIAGNOSIS — Z7901 Long term (current) use of anticoagulants: Secondary | ICD-10-CM

## 2016-02-01 DIAGNOSIS — Z5181 Encounter for therapeutic drug level monitoring: Secondary | ICD-10-CM | POA: Diagnosis not present

## 2016-02-01 DIAGNOSIS — Z95810 Presence of automatic (implantable) cardiac defibrillator: Secondary | ICD-10-CM

## 2016-02-01 LAB — POCT INR: INR: 2.2

## 2016-02-04 DIAGNOSIS — F0280 Dementia in other diseases classified elsewhere without behavioral disturbance: Secondary | ICD-10-CM | POA: Diagnosis not present

## 2016-02-04 DIAGNOSIS — G309 Alzheimer's disease, unspecified: Secondary | ICD-10-CM | POA: Diagnosis not present

## 2016-02-05 ENCOUNTER — Ambulatory Visit (INDEPENDENT_AMBULATORY_CARE_PROVIDER_SITE_OTHER): Payer: Medicare Other

## 2016-02-05 DIAGNOSIS — Z95810 Presence of automatic (implantable) cardiac defibrillator: Secondary | ICD-10-CM

## 2016-02-05 DIAGNOSIS — I5022 Chronic systolic (congestive) heart failure: Secondary | ICD-10-CM | POA: Diagnosis not present

## 2016-02-05 NOTE — Progress Notes (Signed)
EPIC Encounter for ICM Monitoring  Patient Name: Jon Butler is a 81 y.o. male Date: 02/05/2016 Primary Care Physican: [name], MD Primary Cardiologist:Bensimhon Electrophysiologist: Lovena Le Dry Weight:163 lbs Bi-V Pacing: 80.4%  Clinical Status (03-Jan-2016 to 05-Feb-2016) Treated VT/VF 0 episodes AT/AF 3 episodes  Time in AT/AF <0.1 hr/day (<0.1%)      Spoke with wife.  Patient has Alzheimers. Heart Failure questions reviewed, pt asymptomatic   Thoracic impedance normal.  She stated since patient quit drinking alcohol his heart condition is better.  She said he stopped drinking about November 2017 and thinks he has forgotten about alcohol.  Advised thoracic impedance has drastically improved since about October of 2017.  Labs: 12/18/2015 Creatinine 1.10, BUN 24, Potassium 4.8, Sodium 140, EGFR >60 09/27/2015 Creatinine 0.96, BUN 19, Potassium 4.4, Sodium 139, EGFR >60  07/27/2015 Creatinine 0.98, BUN 14, Potassium 5.2, Sodium 136, EGFR >60  04/07/2015 Creatinine 0.71, BUN 17, Potassium 3.8, Sodium 141, EGFR >60  04/06/2015 Creatinine 0.70, BUN 20, Potassium 4.4, Sodium 141, EGFR >60  04/05/2015 Creatinine 0.78, BUN 32, Potassium 4.4, Sodium 140, EGFR >60  04/04/2015 Creatinine 1.07, BUN 45, Potassium 5.0, Sodium 140, EGFR >60  03/22/2015 Creatinine 1.05, BUN 22, Potassium 4.8, Sodium 142, EGFR >60   Recommendations: No changes. Reminded to limit dietary salt intake to 2000 mg/day and fluid intake to < 2 liters/day. Encouraged to call for fluid symptoms.  Follow-up plan: ICM clinic phone appointment on 03/18/2016.  HF office appointment 03/05/2016  Copy of ICM check sent to device physician.   3 month ICM trend: 02/05/2016   1 Year ICM trend:      Rosalene Billings, RN 02/05/2016 2:07 PM

## 2016-02-07 DIAGNOSIS — F0280 Dementia in other diseases classified elsewhere without behavioral disturbance: Secondary | ICD-10-CM | POA: Diagnosis not present

## 2016-02-07 DIAGNOSIS — G309 Alzheimer's disease, unspecified: Secondary | ICD-10-CM | POA: Diagnosis not present

## 2016-02-11 DIAGNOSIS — F0280 Dementia in other diseases classified elsewhere without behavioral disturbance: Secondary | ICD-10-CM | POA: Diagnosis not present

## 2016-02-11 DIAGNOSIS — G309 Alzheimer's disease, unspecified: Secondary | ICD-10-CM | POA: Diagnosis not present

## 2016-02-12 ENCOUNTER — Ambulatory Visit (INDEPENDENT_AMBULATORY_CARE_PROVIDER_SITE_OTHER): Payer: Medicare Other

## 2016-02-12 DIAGNOSIS — I4891 Unspecified atrial fibrillation: Secondary | ICD-10-CM | POA: Diagnosis not present

## 2016-02-12 DIAGNOSIS — Z5181 Encounter for therapeutic drug level monitoring: Secondary | ICD-10-CM | POA: Diagnosis not present

## 2016-02-12 DIAGNOSIS — Z7901 Long term (current) use of anticoagulants: Secondary | ICD-10-CM | POA: Diagnosis not present

## 2016-02-12 DIAGNOSIS — Z95810 Presence of automatic (implantable) cardiac defibrillator: Secondary | ICD-10-CM | POA: Diagnosis not present

## 2016-02-12 LAB — POCT INR: INR: 2.7

## 2016-02-15 ENCOUNTER — Telehealth: Payer: Self-pay

## 2016-02-15 NOTE — Telephone Encounter (Signed)
Received call from wife.  She stated she has only been giving patient Furosemide 40 mg about every 3-4 days since fluid intake is 50 oz a day.  He has stopped drinking alcohol because she thinks the Alzheimers has made him forget about it.  She said he weight ranges 164-165 lbs and she wanted to confirm how often he should be taking the Furosemide.  Advised of prescription instructions and if he is at 164 lbs then he can take it every other day and reviewed signs of dehydration to report.  Also advised if he loses weight to quickly to call back.  She stated she would do so.  Explained he can drink up to 2 liters of fluid a day.

## 2016-03-05 ENCOUNTER — Ambulatory Visit (HOSPITAL_COMMUNITY)
Admission: RE | Admit: 2016-03-05 | Discharge: 2016-03-05 | Disposition: A | Discharge status: Home / Self Care | Payer: Medicare Other | Source: Ambulatory Visit | Attending: Internal Medicine | Admitting: Internal Medicine

## 2016-03-05 ENCOUNTER — Encounter (HOSPITAL_COMMUNITY): Payer: Self-pay | Admitting: Internal Medicine

## 2016-03-05 VITALS — BP 110/80 | HR 84 | Wt 170.0 lb

## 2016-03-05 DIAGNOSIS — R413 Other amnesia: Secondary | ICD-10-CM | POA: Diagnosis not present

## 2016-03-05 DIAGNOSIS — F0390 Unspecified dementia without behavioral disturbance: Secondary | ICD-10-CM | POA: Insufficient documentation

## 2016-03-05 DIAGNOSIS — I447 Left bundle-branch block, unspecified: Secondary | ICD-10-CM | POA: Insufficient documentation

## 2016-03-05 DIAGNOSIS — Z950 Presence of cardiac pacemaker: Secondary | ICD-10-CM | POA: Diagnosis not present

## 2016-03-05 DIAGNOSIS — I429 Cardiomyopathy, unspecified: Secondary | ICD-10-CM | POA: Insufficient documentation

## 2016-03-05 DIAGNOSIS — Z79899 Other long term (current) drug therapy: Secondary | ICD-10-CM | POA: Insufficient documentation

## 2016-03-05 DIAGNOSIS — K224 Dyskinesia of esophagus: Secondary | ICD-10-CM | POA: Insufficient documentation

## 2016-03-05 DIAGNOSIS — N183 Chronic kidney disease, stage 3 unspecified: Secondary | ICD-10-CM

## 2016-03-05 DIAGNOSIS — M21379 Foot drop, unspecified foot: Secondary | ICD-10-CM | POA: Diagnosis not present

## 2016-03-05 DIAGNOSIS — Z7901 Long term (current) use of anticoagulants: Secondary | ICD-10-CM | POA: Diagnosis not present

## 2016-03-05 DIAGNOSIS — Z9119 Patient's noncompliance with other medical treatment and regimen: Secondary | ICD-10-CM | POA: Insufficient documentation

## 2016-03-05 DIAGNOSIS — I480 Paroxysmal atrial fibrillation: Secondary | ICD-10-CM | POA: Insufficient documentation

## 2016-03-05 DIAGNOSIS — Z66 Do not resuscitate: Secondary | ICD-10-CM | POA: Insufficient documentation

## 2016-03-05 DIAGNOSIS — I5022 Chronic systolic (congestive) heart failure: Secondary | ICD-10-CM | POA: Diagnosis not present

## 2016-03-05 DIAGNOSIS — I4891 Unspecified atrial fibrillation: Secondary | ICD-10-CM | POA: Diagnosis not present

## 2016-03-05 DIAGNOSIS — Z7982 Long term (current) use of aspirin: Secondary | ICD-10-CM | POA: Diagnosis not present

## 2016-03-05 MED ORDER — SACUBITRIL-VALSARTAN 24-26 MG PO TABS: 1.0000 | ORAL_TABLET | Freq: Two times a day (BID) | ORAL | 3 refills | Status: DC

## 2016-03-05 NOTE — Patient Instructions (Addendum)
Stop Losartan  Start Entresto 24/26 mg Twice daily   Your physician recommends that you schedule a follow-up appointment in: 3 months

## 2016-03-05 NOTE — Progress Notes (Signed)
Advanced Heart Failure Note   Patient ID: Jon Butler, male   DOB: [birth year], 81 y.o.   MRN: [id number] PCP: Dr Lysle Rubens EP: Dr Lovena Le [facility] HF: Campbell Agramonte  HPI: [name] is a 81 yo male with a history of chronic systolic HF EF [id number] due to NICM s/p CRT-D (Medtronic), mechanical AVR (St Jude), LBBB, PAF S/P DC-CV 12/20/12, ETOH abuse and dementia and non-compliance.   Admitted to [facility] 10/10 through 10/26/12 with ADHF in setting of recurrent AF. Developed cardiogenic shock and VDRF   BB and ACE-I held and amiodarone gtt was started. Required short term Milrinone and lasix.Diuresed 26 pounds and transitioned to 40 mg daily. DC-CV on 10/20. Remains on Coumadin. D/C weight 206 pounds.   S/P successful DC-CV 12/20/12. Amio increased to 400 bid.   Has seen Dr. Sherron Flemings in Neurology for progressive cognitive decline in setting of ETOH abuse. Also had foot drop.   Admitted in 8/16 for respiratory failure due to PNA and HF in setting of recurrent AF. Underwent TEE/DC-CV with nearly immediate reversion to AF.Seen by EP and felt to have end-stage HF and rate control strategy recommended. Amio stopped. Also underwent botox injection in esophagus for esophageal spasm/dysphagia. Made DNR and ICD deactivated. Weight at home 193-194 (was discharged at 191 pound)   Saw Dr. Lovena Le in November 2016 and was in AFL paced back into NSR.  Admitted in March 2017 with pneumonia and possible sepsis. Treated abx.   Today he returns for regular f/u. At last visit in December fluid was up and we gave him metolazone x 1. Feels fine. Not very active. Weight at home got down to 164 but now back up to 168. Wife says that pharmacy changed brands of furosemide and may not be as effective. Taking lasix 40 daily. Does not take extra. Denies dyspnea or orthopnea. But wife says he sleeps propped up.  He continues to struggle with memory.  Echo 3/17 25-30%   11/01/12 Labs INR > 8 10/26/12 K 3.0 Creatinine 1.23    12/16/12 K 4.6 Creatinine 1.05 12/20/12 K 4.0 Creatinine 1.04 11/15      K 4.4 Creatinine 1.1 5/16: K 4.3 Creatinine 0.94  6/16: INR 1.9  09/07/14: K 4.0, Cr 1.00 01/05/15 K 4.7 creatinine 0.9    ROS: All systems negative except as listed in HPI, PMH and Problem List.  Past Medical History:  Diagnosis Date  . Atrial fibrillation ([place])   . Cardiomyopathy   . Chronic systolic heart failure ([place])   . Dementia   . Left bundle-branch block   . Pneumonia 08/2014    Current Outpatient Prescriptions  Medication Sig Dispense Refill  . allopurinol (ZYLOPRIM) 300 MG tablet Take 300 mg by mouth daily.     Marland Kitchen amiodarone (PACERONE) 200 MG tablet Take 1 tablet (200 mg total) by mouth daily. 30 tablet 3  . amoxicillin (AMOXIL) 500 MG capsule Take 500 mg by mouth 3 (three) times a week.    Marland Kitchen aspirin EC 81 MG tablet Take 1 tablet (81 mg total) by mouth daily.    . carvedilol (COREG) 3.125 MG tablet Take 3.125 mg by mouth 2 (two) times daily with a meal.    . donepezil (ARICEPT) 23 MG TABS tablet Take 23 mg by mouth 2 (two) times daily.    . fluticasone (FLONASE) 50 MCG/ACT nasal spray Place 2 sprays into both nostrils daily as needed for allergies or rhinitis. Reported on 12/21/2014    . furosemide (LASIX) 40 MG  tablet Take 1 tablet (40 mg total) by mouth daily. If weight is below 164lbs take 40mg  every other day. 30 tablet 3  . LORazepam (ATIVAN) 0.5 MG tablet Take 0.5 mg by mouth at bedtime.    Marland Kitchen losartan (COZAAR) 25 MG tablet Take 1 tablet (25 mg total) by mouth daily. 90 tablet 3  . memantine (NAMENDA) 10 MG tablet Take 1 tablet by mouth 2 (two) times daily.  2  . rosuvastatin (CRESTOR) 10 MG tablet Take 5 mg by mouth daily. Reported on 03/22/2015    . spironolactone (ALDACTONE) 25 MG tablet Take 0.5 tablets (12.5 mg total) by mouth daily. 30 tablet 3  . thiamine (VITAMIN B-1) 100 MG tablet Take 1 tablet (100 mg total) by mouth 2 (two) times daily. 60 tablet 6  . traZODone (DESYREL) 100 MG  tablet Take 400 mg by mouth at bedtime.   2  . warfarin (COUMADIN) 2 MG tablet Take 1-1.5 tablets (2-3 mg total) by mouth daily at 6 PM. Take 2 mg (1 tablet) daily except on Sunday, Tuesday, and Friday. Take 3 mg (1.5 tablets) on Sunday, Tuesday, and Friday    . colchicine 0.6 MG tablet Take 0.6 mg by mouth daily as needed (gout pain). Reported on 12/21/2014    . metolazone (ZAROXOLYN) 2.5 MG tablet Take 1 tab every Friday if weight is 170 lb or greater (Patient not taking: Reported on 03/05/2016) 10 tablet 3  . potassium chloride SA (K-DUR,KLOR-CON) 20 MEQ tablet Take 1 tablet (20 mEq total) by mouth as needed (When you take Metolazone). (Patient not taking: Reported on 03/05/2016) 10 tablet 3   No current facility-administered medications for this encounter.    Vitals:   03/05/16 1418  BP: 110/80  Pulse: 84  SpO2: 96%  Weight: 170 lb (77.1 kg)   Wt Readings from Last 3 Encounters:  03/05/16 170 lb (77.1 kg)  12/18/15 173 lb 8 oz (78.7 kg)  12/05/15 172 lb 1.9 oz (78.1 kg)     PHYSICAL EXAM: General:  Elderly. No resp difficulty Wife present.  HEENT: normal Neck: supple. JVP ~7-8 cm.  Carotids 2+ bilaterally; no bruits. No lymphadenopathy or thryomegaly appreciated. Cor: PMI normal. IRR. Mechanical s2 No rubs, gallops 2/6 TR. Lungs: clear  Abdomen: soft, nontender,no distention. No HSM. No bruits or masses. Good bowel sounds. Extremities: no cyanosis, clubbing, rash, R > LLE 1-2+  edema Neuro: alert & orientedx3, cranial nerves grossly intact. Moves all 4 extremities w/o difficulty. Affect flat  ICD interrogation done personally. Volume mildly elevated. No VT. Had some AF ~4hours per day Feb 13-15) but now back down.  Activity level 0.5 hours per day.    ASSESSMENT & PLAN: 1. Chronic Systolic Heart Failure TEE with EF 15-20%  Up to 25-30% in 3/17  S/P Medtronic CRT-D 09/2012  - NYHA III - Volume status remains mildly elevated on exam and by ICD interrogation. - Continue to take  lasix 40 mg daily and encouraged to take extra as needed to aim to keep weight 164-168 pounds.  - Tolerating losartan 25 mg daily, carvedilol 3.125 bid and spiro 12.5  - We talked about pros/cons of switching losartan to [facility] for help with volume status (and outcomes) versus just increasing the lasix. They prefer to give Delene Loll a try for a month and see how it goes. Will start 24/26.  -Encouraged him to walk around the house several times per day to increase activity level 2. A Fib-  S/P DC-CV 12/20/12 but reverted  to AF. Had AFL in 11/16 and paced out. I very symptomatic with AF and loss of biv pacing - Has had some PAF recently on ICD interrogation. Irregular on exam, today ut ECG is NSR with PVCs.. Continue amio at 200 daily. Can increase amio if needed.  - Continue coumadin for AF and AVR.   3. CKD stage 3 4. Alcohol- Reinforced alcohol abstinence especially with coumadin.  5. Memory loss-  Neurology following. On namenda and aricept.  6. Mechanical AVR - on coumadin. SBE prophylaxis for all procedures.    Glori Bickers, MD 2:44 PM

## 2016-03-12 ENCOUNTER — Ambulatory Visit (INDEPENDENT_AMBULATORY_CARE_PROVIDER_SITE_OTHER): Payer: Medicare Other | Admitting: Pharmacist

## 2016-03-12 DIAGNOSIS — Z5181 Encounter for therapeutic drug level monitoring: Secondary | ICD-10-CM | POA: Diagnosis not present

## 2016-03-12 DIAGNOSIS — Z95810 Presence of automatic (implantable) cardiac defibrillator: Secondary | ICD-10-CM

## 2016-03-12 DIAGNOSIS — Z7901 Long term (current) use of anticoagulants: Secondary | ICD-10-CM | POA: Diagnosis not present

## 2016-03-12 DIAGNOSIS — I4891 Unspecified atrial fibrillation: Secondary | ICD-10-CM

## 2016-03-12 LAB — POCT INR: INR: 2.7

## 2016-03-15 ENCOUNTER — Inpatient Hospital Stay (HOSPITAL_COMMUNITY)
Admission: EM | Admit: 2016-03-15 | Discharge: 2016-03-21 | DRG: 291 | Disposition: A | Discharge status: Hospice | Payer: Medicare Other | Attending: Internal Medicine | Admitting: Internal Medicine

## 2016-03-15 ENCOUNTER — Encounter (HOSPITAL_COMMUNITY): Payer: Self-pay | Admitting: Emergency Medicine

## 2016-03-15 ENCOUNTER — Emergency Department (HOSPITAL_COMMUNITY): Payer: Medicare Other

## 2016-03-15 DIAGNOSIS — I5043 Acute on chronic combined systolic (congestive) and diastolic (congestive) heart failure: Principal | ICD-10-CM | POA: Diagnosis present

## 2016-03-15 DIAGNOSIS — I480 Paroxysmal atrial fibrillation: Secondary | ICD-10-CM | POA: Diagnosis present

## 2016-03-15 DIAGNOSIS — J9621 Acute and chronic respiratory failure with hypoxia: Secondary | ICD-10-CM | POA: Diagnosis not present

## 2016-03-15 DIAGNOSIS — I428 Other cardiomyopathies: Secondary | ICD-10-CM | POA: Diagnosis not present

## 2016-03-15 DIAGNOSIS — I959 Hypotension, unspecified: Secondary | ICD-10-CM | POA: Diagnosis not present

## 2016-03-15 DIAGNOSIS — F0390 Unspecified dementia without behavioral disturbance: Secondary | ICD-10-CM | POA: Diagnosis present

## 2016-03-15 DIAGNOSIS — F03B0 Unspecified dementia, moderate, without behavioral disturbance, psychotic disturbance, mood disturbance, and anxiety: Secondary | ICD-10-CM | POA: Diagnosis present

## 2016-03-15 DIAGNOSIS — Z7189 Other specified counseling: Secondary | ICD-10-CM

## 2016-03-15 DIAGNOSIS — I429 Cardiomyopathy, unspecified: Secondary | ICD-10-CM | POA: Diagnosis present

## 2016-03-15 DIAGNOSIS — M7989 Other specified soft tissue disorders: Secondary | ICD-10-CM | POA: Diagnosis present

## 2016-03-15 DIAGNOSIS — R069 Unspecified abnormalities of breathing: Secondary | ICD-10-CM | POA: Diagnosis not present

## 2016-03-15 DIAGNOSIS — R05 Cough: Secondary | ICD-10-CM | POA: Diagnosis not present

## 2016-03-15 DIAGNOSIS — Z515 Encounter for palliative care: Secondary | ICD-10-CM

## 2016-03-15 DIAGNOSIS — Z8249 Family history of ischemic heart disease and other diseases of the circulatory system: Secondary | ICD-10-CM

## 2016-03-15 DIAGNOSIS — I5023 Acute on chronic systolic (congestive) heart failure: Secondary | ICD-10-CM

## 2016-03-15 DIAGNOSIS — Z7901 Long term (current) use of anticoagulants: Secondary | ICD-10-CM

## 2016-03-15 DIAGNOSIS — I472 Ventricular tachycardia: Secondary | ICD-10-CM | POA: Diagnosis not present

## 2016-03-15 DIAGNOSIS — J209 Acute bronchitis, unspecified: Secondary | ICD-10-CM | POA: Diagnosis present

## 2016-03-15 DIAGNOSIS — E876 Hypokalemia: Secondary | ICD-10-CM | POA: Diagnosis present

## 2016-03-15 DIAGNOSIS — R5381 Other malaise: Secondary | ICD-10-CM | POA: Diagnosis present

## 2016-03-15 DIAGNOSIS — Z66 Do not resuscitate: Secondary | ICD-10-CM | POA: Diagnosis present

## 2016-03-15 DIAGNOSIS — I509 Heart failure, unspecified: Secondary | ICD-10-CM | POA: Diagnosis not present

## 2016-03-15 DIAGNOSIS — I447 Left bundle-branch block, unspecified: Secondary | ICD-10-CM | POA: Diagnosis present

## 2016-03-15 DIAGNOSIS — R4182 Altered mental status, unspecified: Secondary | ICD-10-CM | POA: Diagnosis present

## 2016-03-15 DIAGNOSIS — Z952 Presence of prosthetic heart valve: Secondary | ICD-10-CM

## 2016-03-15 DIAGNOSIS — Z95810 Presence of automatic (implantable) cardiac defibrillator: Secondary | ICD-10-CM | POA: Diagnosis not present

## 2016-03-15 DIAGNOSIS — M109 Gout, unspecified: Secondary | ICD-10-CM | POA: Diagnosis present

## 2016-03-15 DIAGNOSIS — R54 Age-related physical debility: Secondary | ICD-10-CM | POA: Diagnosis present

## 2016-03-15 DIAGNOSIS — I4891 Unspecified atrial fibrillation: Secondary | ICD-10-CM | POA: Diagnosis present

## 2016-03-15 DIAGNOSIS — E785 Hyperlipidemia, unspecified: Secondary | ICD-10-CM | POA: Diagnosis present

## 2016-03-15 LAB — COMPREHENSIVE METABOLIC PANEL
ALT: 15 U/L — AB (ref 17–63)
AST: 20 U/L (ref 15–41)
Albumin: 3.5 g/dL (ref 3.5–5.0)
Alkaline Phosphatase: 93 U/L (ref 38–126)
Anion gap: 12 (ref 5–15)
BUN: 30 mg/dL — AB (ref 6–20)
CO2: 30 mmol/L (ref 22–32)
CREATININE: 1.06 mg/dL (ref 0.61–1.24)
Calcium: 8.6 mg/dL — ABNORMAL LOW (ref 8.9–10.3)
Chloride: 98 mmol/L — ABNORMAL LOW (ref 101–111)
GFR calc Af Amer: >60 mL/min (ref >60)
Glucose, Bld: 118 mg/dL — ABNORMAL HIGH (ref 65–99)
POTASSIUM: 3.9 mmol/L (ref 3.5–5.1)
SODIUM: 140 mmol/L (ref 135–145)
Total Bilirubin: 1.3 mg/dL — ABNORMAL HIGH (ref 0.3–1.2)
Total Protein: 6.3 g/dL — ABNORMAL LOW (ref 6.5–8.1)

## 2016-03-15 LAB — PROTIME-INR
INR: 2.58
Prothrombin Time: 28.2 seconds — ABNORMAL HIGH (ref 11.4–15.2)

## 2016-03-15 LAB — CBC WITH DIFFERENTIAL/PLATELET
BASOS ABS: 0 10*3/uL (ref 0.0–0.1)
Basophils Relative: 0 %
Eosinophils Absolute: 0 10*3/uL (ref 0.0–0.7)
Eosinophils Relative: 0 %
HEMATOCRIT: 41.5 % (ref 39.0–52.0)
Hemoglobin: 13.2 g/dL (ref 13.0–17.0)
LYMPHS ABS: 1.6 10*3/uL (ref 0.7–4.0)
Lymphocytes Relative: 17 %
MCH: 30.8 pg (ref 26.0–34.0)
MCHC: 31.8 g/dL (ref 30.0–36.0)
MCV: 96.7 fL (ref 78.0–100.0)
MONO ABS: 0.9 10*3/uL (ref 0.1–1.0)
Monocytes Relative: 10 %
NEUTROS ABS: 6.8 10*3/uL (ref 1.7–7.7)
Neutrophils Relative %: 73 %
Platelets: 130 10*3/uL — ABNORMAL LOW (ref 150–400)
RBC: 4.29 MIL/uL (ref 4.22–5.81)
RDW: 17.4 % — ABNORMAL HIGH (ref 11.5–15.5)
WBC: 9.3 10*3/uL (ref 4.0–10.5)

## 2016-03-15 LAB — I-STAT CG4 LACTIC ACID, ED: LACTIC ACID, VENOUS: 1.55 mmol/L (ref 0.5–1.9)

## 2016-03-15 LAB — BRAIN NATRIURETIC PEPTIDE

## 2016-03-15 LAB — TROPONIN I: TROPONIN I: 0.06 ng/mL — AB (ref <0.03)

## 2016-03-15 MED ORDER — ASPIRIN EC 81 MG PO TBEC
81.0000 mg | DELAYED_RELEASE_TABLET | Freq: Every day | ORAL | Status: DC
  Administered 2016-03-16 – 2016-03-21 (×6): 81 mg via ORAL
  Filled 2016-03-15 (×6): qty 1

## 2016-03-15 MED ORDER — SODIUM CHLORIDE 0.9% FLUSH
3.0000 mL | INTRAVENOUS | Status: DC | PRN
Start: 2016-03-15 — End: 2016-03-21

## 2016-03-15 MED ORDER — FUROSEMIDE 10 MG/ML IJ SOLN
40.0000 mg | Freq: Two times a day (BID) | INTRAMUSCULAR | Status: DC
  Administered 2016-03-16: 40 mg via INTRAVENOUS
  Filled 2016-03-15 (×2): qty 4

## 2016-03-15 MED ORDER — SODIUM CHLORIDE 0.9 % IV SOLN: 250.0000 mL | INTRAVENOUS | Status: DC | PRN

## 2016-03-15 MED ORDER — IPRATROPIUM-ALBUTEROL 0.5-2.5 (3) MG/3ML IN SOLN
3.0000 mL | Freq: Once | RESPIRATORY_TRACT | Status: AC
  Administered 2016-03-15: 3 mL via RESPIRATORY_TRACT
  Filled 2016-03-15: qty 3

## 2016-03-15 MED ORDER — TRAZODONE HCL 100 MG PO TABS
400.0000 mg | ORAL_TABLET | Freq: Every day | ORAL | Status: DC
  Administered 2016-03-16 – 2016-03-20 (×6): 400 mg via ORAL
  Filled 2016-03-15 (×5): qty 4
  Filled 2016-03-15: qty 1

## 2016-03-15 MED ORDER — SPIRONOLACTONE 25 MG PO TABS
12.5000 mg | ORAL_TABLET | Freq: Every day | ORAL | Status: DC
  Administered 2016-03-16 – 2016-03-21 (×6): 12.5 mg via ORAL
  Filled 2016-03-15 (×6): qty 1

## 2016-03-15 MED ORDER — AMIODARONE HCL 200 MG PO TABS
200.0000 mg | ORAL_TABLET | Freq: Every day | ORAL | Status: DC
  Administered 2016-03-16: 200 mg via ORAL
  Filled 2016-03-15: qty 1

## 2016-03-15 MED ORDER — ALBUTEROL (5 MG/ML) CONTINUOUS INHALATION SOLN
5.0000 mg/h | INHALATION_SOLUTION | Freq: Once | RESPIRATORY_TRACT | Status: AC
  Administered 2016-03-15: 5 mg/h via RESPIRATORY_TRACT
  Filled 2016-03-15: qty 20

## 2016-03-15 MED ORDER — LORAZEPAM 0.5 MG PO TABS
0.5000 mg | ORAL_TABLET | Freq: Every day | ORAL | Status: DC
  Administered 2016-03-16 – 2016-03-20 (×6): 0.5 mg via ORAL
  Filled 2016-03-15 (×6): qty 1

## 2016-03-15 MED ORDER — WARFARIN - PHARMACIST DOSING INPATIENT
Freq: Every day | Status: DC
  Administered 2016-03-16 – 2016-03-20 (×3)

## 2016-03-15 MED ORDER — WARFARIN SODIUM 2 MG PO TABS
2.0000 mg | ORAL_TABLET | ORAL | Status: DC
  Administered 2016-03-16: 2 mg via ORAL
  Filled 2016-03-15: qty 1

## 2016-03-15 MED ORDER — MEMANTINE HCL 5 MG PO TABS
10.0000 mg | ORAL_TABLET | Freq: Two times a day (BID) | ORAL | Status: DC
  Administered 2016-03-16 – 2016-03-21 (×12): 10 mg via ORAL
  Filled 2016-03-15: qty 1
  Filled 2016-03-15 (×3): qty 2
  Filled 2016-03-15: qty 1
  Filled 2016-03-15 (×7): qty 2

## 2016-03-15 MED ORDER — FLUTICASONE PROPIONATE 50 MCG/ACT NA SUSP: 2.0000 | Freq: Every day | NASAL | Status: DC | PRN

## 2016-03-15 MED ORDER — CARVEDILOL 3.125 MG PO TABS
3.1250 mg | ORAL_TABLET | Freq: Two times a day (BID) | ORAL | Status: DC
  Administered 2016-03-16: 3.125 mg via ORAL
  Filled 2016-03-15: qty 1

## 2016-03-15 MED ORDER — ROSUVASTATIN CALCIUM 10 MG PO TABS
5.0000 mg | ORAL_TABLET | Freq: Every day | ORAL | Status: DC
  Administered 2016-03-16 – 2016-03-20 (×5): 5 mg via ORAL
  Filled 2016-03-15 (×5): qty 1

## 2016-03-15 MED ORDER — WARFARIN SODIUM 2 MG PO TABS: 3.0000 mg | ORAL_TABLET | ORAL | Status: DC

## 2016-03-15 MED ORDER — VITAMIN B-1 100 MG PO TABS
100.0000 mg | ORAL_TABLET | Freq: Two times a day (BID) | ORAL | Status: DC
  Administered 2016-03-16 – 2016-03-21 (×12): 100 mg via ORAL
  Filled 2016-03-15 (×12): qty 1

## 2016-03-15 MED ORDER — ONDANSETRON HCL 4 MG/2ML IJ SOLN
4.0000 mg | Freq: Four times a day (QID) | INTRAMUSCULAR | Status: DC | PRN
Start: 2016-03-15 — End: 2016-03-21

## 2016-03-15 MED ORDER — FUROSEMIDE 10 MG/ML IJ SOLN
40.0000 mg | INTRAMUSCULAR | Status: AC
  Administered 2016-03-15: 40 mg via INTRAVENOUS
  Filled 2016-03-15: qty 4

## 2016-03-15 MED ORDER — DONEPEZIL HCL 23 MG PO TABS
23.0000 mg | ORAL_TABLET | Freq: Two times a day (BID) | ORAL | Status: DC
  Administered 2016-03-16 – 2016-03-18 (×6): 23 mg via ORAL
  Filled 2016-03-15 (×8): qty 1

## 2016-03-15 MED ORDER — SACUBITRIL-VALSARTAN 24-26 MG PO TABS: 1.0000 | ORAL_TABLET | Freq: Two times a day (BID) | ORAL | Status: DC

## 2016-03-15 MED ORDER — ACETAMINOPHEN 325 MG PO TABS: 650.0000 mg | ORAL_TABLET | ORAL | Status: DC | PRN

## 2016-03-15 MED ORDER — SODIUM CHLORIDE 0.9% FLUSH
3.0000 mL | Freq: Two times a day (BID) | INTRAVENOUS | Status: DC
  Administered 2016-03-16 – 2016-03-21 (×5): 3 mL via INTRAVENOUS

## 2016-03-15 MED ORDER — ALLOPURINOL 300 MG PO TABS
300.0000 mg | ORAL_TABLET | Freq: Every day | ORAL | Status: DC
  Administered 2016-03-16 – 2016-03-21 (×6): 300 mg via ORAL
  Filled 2016-03-15 (×6): qty 1

## 2016-03-15 NOTE — H&P (Signed)
History and Physical    Jon Butler [id number] DOB: [birth year] DOA: 03/15/2016   PCP: [name], MD Chief Complaint:  Chief Complaint  Patient presents with  . Cough  . Altered Mental Status  . Leg Swelling    HPI: Jon Butler is a 81 y.o. male with medical history significant of CHF with EF 25-30%, AICD / biventricular pacer, A.Fib, NICM, dementia.  Patient presents to the ED with family with AMS onset today.  He has had SOB, cough productive of whiteish sputum, leg swelling (R>L but he always has this) worsening over the past 2 weeks.  Course of amoxicillin has provided no relief.  Started Entresto 2 days ago which also hasnt really helped.  Pulse ox at home today read as low as 83% with worsening confusion so wife and son brought patient in to the ED.  ED Course: Work up in the ED is strongly suggestive of CHF.  CXR shows pulmonary edema, he is rate controlled but in A.Fib (per son onset of A.Fib usually signifies that he is going into CHF), his BNP is unmeasureably high (and highest it has ever been).  While his weight is 165lbs which was his dry weight a year ago.  He has likely lost weight over the past year according to Son.  Review of Systems: As per HPI otherwise 10 point review of systems negative.    Past Medical History:  Diagnosis Date  . Atrial fibrillation ([place])   . Cardiomyopathy   . Chronic systolic heart failure ([place])   . Dementia   . Left bundle-branch block   . Pneumonia 08/2014    Past Surgical History:  Procedure Laterality Date  . AORTIC VALVE REPLACEMENT    . BOTOX INJECTION N/A 08/18/2014   Procedure: BOTOX INJECTION;  Surgeon: Wonda Horner, MD;  Location: [facility] ENDOSCOPY;  Service: Endoscopy;  Laterality: N/A;  . CARDIOVERSION N/A 10/25/2012   Procedure: CARDIOVERSION;  Surgeon: Evans Lance, MD;  Location: [place];  Service: Cardiovascular;  Laterality: N/A;  . CARDIOVERSION N/A 12/20/2012   Procedure: CARDIOVERSION;  Surgeon: Larey Dresser, MD;  Location: [place];  Service: Cardiovascular;  Laterality: N/A;  . CARDIOVERSION N/A 08/10/2014   Procedure: CARDIOVERSION;  Surgeon: Sanda Klein, MD;  Location[facility] ENDOSCOPY;  Service: Cardiovascular;  Laterality: N/A;  . DOPPLER ECHOCARDIOGRAPHY  2008, 2009  . ESOPHAGOGASTRODUODENOSCOPY (EGD) WITH PROPOFOL N/A 08/18/2014   Procedure: ESOPHAGOGASTRODUODENOSCOPY (EGD) WITH PROPOFOL;  Surgeon: Wonda Horner, MD;  Location: [facility] ENDOSCOPY;  Service: Endoscopy;  Laterality: N/A;  . IMPLANTABLE CARDIOVERTER DEFIBRILLATOR GENERATOR CHANGE N/A 09/20/2012   Procedure: IMPLANTABLE CARDIOVERTER DEFIBRILLATOR GENERATOR CHANGE;  Surgeon: Evans Lance, MD;  Location: [facility] CATH LAB;  Service: Cardiovascular;  Laterality: N/A;  . TEE WITHOUT CARDIOVERSION N/A 10/15/2012   Procedure: TRANSESOPHAGEAL ECHOCARDIOGRAM (TEE);  Surgeon: Fay Records, MD;  Location: [facility] ENDOSCOPY;  Service: Cardiovascular;  Laterality: N/A;  . TEE WITHOUT CARDIOVERSION N/A 08/10/2014   Procedure: TRANSESOPHAGEAL ECHOCARDIOGRAM (TEE);  Surgeon: Sanda Klein, MD;  Location: [facility] ENDOSCOPY;  Service: Cardiovascular;  Laterality: N/A;     reports that he has never smoked. He has never used smokeless tobacco. He reports that he drinks alcohol. He reports that he does not use drugs.  No Known Allergies  Family History  Problem Relation Age of Onset  . Aneurysm Mother     brain  . Heart attack Father 56      Prior to Admission medications   Medication Sig Start Date End  Date Taking? Authorizing Provider  allopurinol (ZYLOPRIM) 300 MG tablet Take 300 mg by mouth daily.    Yes Historical Provider, MD  amiodarone (PACERONE) 200 MG tablet Take 1 tablet (200 mg total) by mouth daily. 01/28/16  Yes Jolaine Artist, MD  amoxicillin (AMOXIL) 500 MG capsule Take 500 mg by mouth 3 (three) times daily.    Yes Historical Provider, MD  aspirin EC 81 MG tablet Take 1 tablet (81 mg total) by mouth daily. 10/26/12  Yes Brooke O  Edmisten, PA-C  carvedilol (COREG) 3.125 MG tablet Take 3.125 mg by mouth 2 (two) times daily with a meal.   Yes Historical Provider, MD  colchicine 0.6 MG tablet Take 0.6 mg by mouth daily as needed (gout pain). Reported on 12/21/2014   Yes Historical Provider, MD  donepezil (ARICEPT) 23 MG TABS tablet Take 23 mg by mouth 2 (two) times daily.   Yes Historical Provider, MD  fluticasone (FLONASE) 50 MCG/ACT nasal spray Place 2 sprays into both nostrils daily as needed for allergies or rhinitis. Reported on 12/21/2014 08/17/13  Yes Historical Provider, MD  furosemide (LASIX) 40 MG tablet Take 1 tablet (40 mg total) by mouth daily. If weight is below 164lbs take 40mg  every other day. Patient taking differently: Take 40 mg by mouth daily.  11/16/15  Yes Larey Dresser, MD  LORazepam (ATIVAN) 0.5 MG tablet Take 0.5 mg by mouth at bedtime.   Yes Historical Provider, MD  memantine (NAMENDA) 10 MG tablet Take 1 tablet by mouth 2 (two) times daily. 11/12/15  Yes Historical Provider, MD  rosuvastatin (CRESTOR) 10 MG tablet Take 5 mg by mouth daily. Reported on 03/22/2015   Yes Historical Provider, MD  sacubitril-valsartan (ENTRESTO) 24-26 MG Take 1 tablet by mouth 2 (two) times daily. 03/05/16  Yes Jolaine Artist, MD  spironolactone (ALDACTONE) 25 MG tablet Take 0.5 tablets (12.5 mg total) by mouth daily. 10/27/12  Yes Evans Lance, MD  thiamine (VITAMIN B-1) 100 MG tablet Take 1 tablet (100 mg total) by mouth 2 (two) times daily. 12/16/12  Yes Amy D Clegg, NP  traZODone (DESYREL) 100 MG tablet Take 400 mg by mouth at bedtime.  06/29/14  Yes Historical Provider, MD  warfarin (COUMADIN) 2 MG tablet Take 1-1.5 tablets (2-3 mg total) by mouth daily at 6 PM. Take 2 mg (1 tablet) daily except on Sunday, Tuesday, and Friday. Take 3 mg (1.5 tablets) on Sunday, Tuesday, and Friday Patient taking differently: Take 2-3 mg by mouth See admin instructions. Take 2 mg on Tuesday, Thursday, Saturday and Sunday then take 3  mg on Monday, Wednesday and Friday 11/24/14  Yes Domenic Polite, MD    Physical Exam: Vitals:   03/15/16 2230 03/15/16 2245 03/15/16 2300 03/15/16 2315  BP: 109/94 114/65 102/75 100/67  Pulse: 61 61 61 61  Resp: 18 18 17 18   Temp:      TempSrc:      SpO2: 100% 100% 100% 100%  Weight:          Constitutional: NAD, calm, comfortable Eyes: PERRL, lids and conjunctivae normal ENMT: Mucous membranes are moist. Posterior pharynx clear of any exudate or lesions.Normal dentition.  Neck: normal, supple, no masses, no thyromegaly Respiratory: Bilateral crackles Cardiovascular: irr, irr, 3+ pitting edema RLE, 2+ LLE Abdomen: no tenderness, no masses palpated. No hepatosplenomegaly. Bowel sounds positive.  Musculoskeletal: no clubbing / cyanosis. No joint deformity upper and lower extremities. Good ROM, no contractures. Normal muscle tone.  Skin: no rashes, lesions,  ulcers. No induration Neurologic: CN 2-12 grossly intact. Sensation intact, DTR normal. Strength 5/5 in all 4.  Psychiatric: Normal judgment and insight. Alert and oriented x 3. Normal mood.    Labs on Admission: I have personally reviewed following labs and imaging studies  CBC:  Recent Labs Lab 03/15/16 2136  WBC 9.3  NEUTROABS 6.8  HGB 13.2  HCT 41.5  MCV 96.7  PLT 742*   Basic Metabolic Panel:  Recent Labs Lab 03/15/16 2136  NA 140  K 3.9  CL 98*  CO2 30  GLUCOSE 118*  BUN 30*  CREATININE 1.06  CALCIUM 8.6*   GFR: Estimated Creatinine Clearance: 57.4 mL/min (by C-G formula based on SCr of 1.06 mg/dL). Liver Function Tests:  Recent Labs Lab 03/15/16 2136  AST 20  ALT 15*  ALKPHOS 93  BILITOT 1.3*  PROT 6.3*  ALBUMIN 3.5   No results for input(s): LIPASE, AMYLASE in the last 168 hours. No results for input(s): AMMONIA in the last 168 hours. Coagulation Profile:  Recent Labs Lab 03/12/16 1409 03/15/16 2136  INR 2.7 2.58   Cardiac Enzymes: No results for input(s): CKTOTAL, CKMB,  CKMBINDEX, TROPONINI in the last 168 hours. BNP (last 3 results) No results for input(s): PROBNP in the last 8760 hours. HbA1C: No results for input(s): HGBA1C in the last 72 hours. CBG: No results for input(s): GLUCAP in the last 168 hours. Lipid Profile: No results for input(s): CHOL, HDL, LDLCALC, TRIG, CHOLHDL, LDLDIRECT in the last 72 hours. Thyroid Function Tests: No results for input(s): TSH, T4TOTAL, FREET4, T3FREE, THYROIDAB in the last 72 hours. Anemia Panel: No results for input(s): VITAMINB12, FOLATE, FERRITIN, TIBC, IRON, RETICCTPCT in the last 72 hours. Urine analysis:    Component Value Date/Time   COLORURINE YELLOW 04/04/2015 1400   APPEARANCEUR CLOUDY (A) 04/04/2015 1400   LABSPEC 1.020 04/04/2015 1400   PHURINE 5.0 04/04/2015 1400   GLUCOSEU NEGATIVE 04/04/2015 1400   HGBUR NEGATIVE 04/04/2015 1400   BILIRUBINUR NEGATIVE 04/04/2015 1400   KETONESUR NEGATIVE 04/04/2015 1400   PROTEINUR NEGATIVE 04/04/2015 1400   UROBILINOGEN 1.0 08/08/2014 2229   NITRITE NEGATIVE 04/04/2015 1400   LEUKOCYTESUR SMALL (A) 04/04/2015 1400   Sepsis Labs: @LABRCNTIP (procalcitonin:4,lacticidven:4) )No results found for this or any previous visit (from the past 240 hour(s)).   Radiological Exams on Admission: Dg Chest 2 View  Result Date: 03/15/2016 CLINICAL DATA:  Cough and rhonchi EXAM: CHEST  2 VIEW COMPARISON:  05/09/2015 FINDINGS: Moderate cardiomegaly.  The transvenous cardiac leads appear intact. Moderate vascular and interstitial prominence. Hazy central lung opacities bilaterally. Small right pleural effusion. IMPRESSION: The findings likely represent congestive heart failure with interstitial and alveolar edema, as well a small right pleural effusion. Electronically Signed   By: Andreas Newport M.D.   On: 03/15/2016 22:21    EKG: Independently reviewed.  Assessment/Plan Principal Problem:   Acute on chronic combined systolic and diastolic heart failure (HCC) Active  Problems:   Biventricular implantable cardioverter-defibrillator in situ   Atrial fibrillation (HCC)   NICM (nonischemic cardiomyopathy) (HCC)   Moderate dementia without behavioral disturbance    1. Acute on chronic combined CHF - 1. CHF pathway 2. Lasix 40mg  IV now 3. Hold entresto (as he required midodrine last admission for CHF) 4. Daily BMP 5. Strict intake and output 6. Have lasix 40mg  IV BID ordered, but probably want to give Advanced Heart Failure a consult in the AM as he is a long standing patient of Dr. Clayborne Dana 7. Interrogate pacemaker 8.  Continue aldactone (although im not sure how much of this is going to be absorbed PO due to edema) 2. A.Fib - 1. Continue Coreg 2. Continue coumadin 3. Dementia - Continue home meds   DVT prophylaxis: Coumadin Code Status: Full Family Communication: Family at bedside Consults called: None Admission status: Place in [place], [place] [facility] Pager [phone] from 7PM-7AM  If 7AM-7PM, please contact the day physician for the patient [email or web address] Password [email or web address]  03/15/2016, 11:27 PM

## 2016-03-15 NOTE — Progress Notes (Signed)
ANTICOAGULATION CONSULT NOTE - Initial Consult  Pharmacy Consult for Coumadin Indication: atrial fibrillation  No Known Allergies  Patient Measurements: Weight: 165 lb (74.8 kg)  Vital Signs: Temp: 98.7 F (37.1 C) (03/10 2117) Temp Source: Oral (03/10 2117) BP: 109/94 (03/10 2230) Pulse Rate: 61 (03/10 2230)  Labs:  Recent Labs  03/15/16 2136  HGB 13.2  HCT 41.5  PLT 130*  LABPROT 28.2*  INR 2.58  CREATININE 1.06    Estimated Creatinine Clearance: 57.4 mL/min (by C-G formula based on SCr of 1.06 mg/dL).   Medical History: Past Medical History:  Diagnosis Date  . Atrial fibrillation ([place])   . Cardiomyopathy   . Chronic systolic heart failure ([place])   . Dementia   . Left bundle-branch block   . Pneumonia 08/2014    Assessment: 81yo male presents w/ increased confusion (worsened from Alzhemier's baseline), admitted for further w/u, to continue Coumadin for Afib; current INR at goal w/ last dose of Coumadin taken today prior to arrival.  Goal of Therapy:  INR 2-3   Plan:  Will continue home Coumadin dose of 2mg  TTSS and 3mg  MWF and monitor INR.  Wynona Neat, PharmD, BCPS  03/15/2016,10:47 PM

## 2016-03-15 NOTE — ED Triage Notes (Signed)
Pt presents from home with [facility] where wife called 60 for increased confusion; wife reports pt has hx of alzheimer's but worse in the last 2 hrs; pt also has a moist cough (unable to cough anything up), rhonchi noted; pt also smells of urine; family at bedside reports slight abd distension and LLE edema that has worsened

## 2016-03-15 NOTE — ED Notes (Signed)
ED Provider at bedside. 

## 2016-03-15 NOTE — ED Notes (Signed)
Patient transported to X-ray 

## 2016-03-15 NOTE — ED Notes (Signed)
Son at bedside informed this RN that patient is incontinent and requested condom cath be placed

## 2016-03-15 NOTE — ED Notes (Signed)
Pt to the floor with tech at this time

## 2016-03-15 NOTE — ED Notes (Signed)
Tech Ryland drawing labs

## 2016-03-15 NOTE — ED Provider Notes (Signed)
I saw and evaluated the patient, reviewed the resident's note and I agree with the findings and plan.  Pertinent History: The pt is an elderly 81 y/o male with hx of dementia - has had a progressive cough over the week - wife with similar sx for 3 weeks but improving - the pt has had decreased LOC today with some confusion above baseline and some increased cough but unable to get phlegm up - has had hypoxia at home based on pulse ox taht they have there.  Pertinent Exam findings: NAD, somnolent but easily arousable - follows commands - lungs with ronchi / wheeze, abd soft - RLE is edematous much more than the other side.  Cardiac exam with slight irregularity but good pulses  R/o pna / electrolytes and UA.  Pt not needing supplemental O2 to stay at 92% at this time.  CXR.  BNP > 4500, pt to be admitted to hospitalist. - d/w Dr. Alcario Drought who is in agreement.  I personally interpreted the EKG as well as the resident and agree with the interpretation on the resident's chart.  Final diagnoses:  Acute on chronic systolic congestive heart failure (HCC)      Noemi Chapel, MD 03/16/16 1028

## 2016-03-15 NOTE — ED Provider Notes (Signed)
[facility] DEPT Provider Note   CSN: [id number] Arrival date & time: 03/15/16  2114     History   Chief Complaint Chief Complaint  Patient presents with  . Cough  . Altered Mental Status  . Leg Swelling    HPI Jon Butler is a 81 y.o. male.  HPI   81 year old male with history of dementia, left bundle branch block, cardiomyopathy, CHF with LVEF of 25-30% on lasix, atrial fibrillation on coumadin and amiodarone, who presents for evaluation of cough and worsening confusion. Patient is accompanied by his wife and son who provide the history. They report that the patient has had a cough productive of white phlegm for the last 2 weeks, but over the last 24 hours has continued to cough but is unable to cough up any phlegm. He also states that he had some confusion above his normal baseline. They deny fevers. Patient denies any areas of pain, specifically chest pain, abdominal pain, or headache. They have a pulse ox at home and report that he had an oxygen desaturation to 83% based on their home machine. They deny any history of COPD or emphysema.  Past Medical History:  Diagnosis Date  . Atrial fibrillation ([place])   . Cardiomyopathy   . Chronic systolic heart failure ([place])   . Dementia   . Left bundle-branch block   . Pneumonia 08/2014    Patient Active Problem List   Diagnosis Date Noted  . Transient global amnesia 09/04/2015  . Pneumonia 04/04/2015  . Acute on chronic combined systolic and diastolic heart failure ([place]) 04/04/2015  . Dementia without behavioral disturbance 04/04/2015  . Moderate dementia without behavioral disturbance 02/27/2015  . Hypoxia   . Pressure ulcer 11/23/2014  . Urinary tract infection in elderly patient 11/22/2014  . UTI (urinary tract infection) 11/22/2014  . Lung nodule 11/22/2014  . Achalasia, esophageal 08/19/2014  . History of mechanical aortic valve replacement   . Palliative care encounter   . Cognitive impairment   . Dysphagia   .  Alcohol use 08/09/2014  . Community acquired pneumonia 08/08/2014  . Sepsis ([place]) 08/08/2014  . Encounter for therapeutic drug monitoring 01/28/2013  . Long term current use of anticoagulant therapy 10/29/2012  . Acute on chronic systolic heart failure ([place]) 10/21/2012  . Acute renal failure ([place]) 10/21/2012  . Acute respiratory failure with hypoxia ([place]) 10/15/2012  . Pulmonary edema 10/15/2012  . Cardiac asthma ([place]) 10/15/2012  . NICM (nonischemic cardiomyopathy) ([place]) 10/15/2012  . Poor venous access 10/15/2012  . Atrial fibrillation ([place]) 10/13/2012  . Chronic systolic heart failure ([place]) 11/06/2010  . Biventricular implantable cardioverter-defibrillator in situ 11/06/2010  . Ventricular tachycardia ([place]) 11/06/2010    Past Surgical History:  Procedure Laterality Date  . AORTIC VALVE REPLACEMENT    . BOTOX INJECTION N/A 08/18/2014   Procedure: BOTOX INJECTION;  Surgeon: Wonda Horner, MD;  Location: [facility] ENDOSCOPY;  Service: Endoscopy;  Laterality: N/A;  . CARDIOVERSION N/A 10/25/2012   Procedure: CARDIOVERSION;  Surgeon: Evans Lance, MD;  Location: [facility];  Service: Cardiovascular;  Laterality: N/A;  . CARDIOVERSION N/A 12/20/2012   Procedure: CARDIOVERSION;  Surgeon: Larey Dresser, MD;  Location: [facility];  Service: Cardiovascular;  Laterality: N/A;  . CARDIOVERSION N/A 08/10/2014   Procedure: CARDIOVERSION;  Surgeon: Sanda Klein, MD;  Location: [facility] ENDOSCOPY;  Service: Cardiovascular;  Laterality: N/A;  . DOPPLER ECHOCARDIOGRAPHY  2008, 2009  . ESOPHAGOGASTRODUODENOSCOPY (EGD) WITH PROPOFOL N/A 08/18/2014   Procedure: ESOPHAGOGASTRODUODENOSCOPY (EGD) WITH PROPOFOL;  Surgeon:  Wonda Horner, MD;  Location: [facility] ENDOSCOPY;  Service: Endoscopy;  Laterality: N/A;  . IMPLANTABLE CARDIOVERTER DEFIBRILLATOR GENERATOR CHANGE N/A 09/20/2012   Procedure: IMPLANTABLE CARDIOVERTER DEFIBRILLATOR GENERATOR CHANGE;  Surgeon: Evans Lance, MD;  Location: [facility] CATH LAB;  Service:  Cardiovascular;  Laterality: N/A;  . TEE WITHOUT CARDIOVERSION N/A 10/15/2012   Procedure: TRANSESOPHAGEAL ECHOCARDIOGRAM (TEE);  Surgeon: Fay Records, MD;  Location: [facility] ENDOSCOPY;  Service: Cardiovascular;  Laterality: N/A;  . TEE WITHOUT CARDIOVERSION N/A 08/10/2014   Procedure: TRANSESOPHAGEAL ECHOCARDIOGRAM (TEE);  Surgeon: Sanda Klein, MD;  Location: [facility] ENDOSCOPY;  Service: Cardiovascular;  Laterality: N/A;       Home Medications    Prior to Admission medications   Medication Sig Start Date End Date Taking? Authorizing Provider  allopurinol (ZYLOPRIM) 300 MG tablet Take 300 mg by mouth daily.    Yes Historical Provider, MD  amiodarone (PACERONE) 200 MG tablet Take 1 tablet (200 mg total) by mouth daily. 01/28/16  Yes Jolaine Artist, MD  amoxicillin (AMOXIL) 500 MG capsule Take 500 mg by mouth 3 (three) times daily.    Yes Historical Provider, MD  aspirin EC 81 MG tablet Take 1 tablet (81 mg total) by mouth daily. 10/26/12  Yes Brooke O Edmisten, PA-C  carvedilol (COREG) 3.125 MG tablet Take 3.125 mg by mouth 2 (two) times daily with a meal.   Yes Historical Provider, MD  colchicine 0.6 MG tablet Take 0.6 mg by mouth daily as needed (gout pain). Reported on 12/21/2014   Yes Historical Provider, MD  donepezil (ARICEPT) 23 MG TABS tablet Take 23 mg by mouth 2 (two) times daily.   Yes Historical Provider, MD  fluticasone (FLONASE) 50 MCG/ACT nasal spray Place 2 sprays into both nostrils daily as needed for allergies or rhinitis. Reported on 12/21/2014 08/17/13  Yes Historical Provider, MD  furosemide (LASIX) 40 MG tablet Take 1 tablet (40 mg total) by mouth daily. If weight is below 164lbs take 40mg  every other day. Patient taking differently: Take 40 mg by mouth daily.  11/16/15  Yes Larey Dresser, MD  LORazepam (ATIVAN) 0.5 MG tablet Take 0.5 mg by mouth at bedtime.   Yes Historical Provider, MD  memantine (NAMENDA) 10 MG tablet Take 1 tablet by mouth 2 (two) times daily. 11/12/15   Yes Historical Provider, MD  rosuvastatin (CRESTOR) 10 MG tablet Take 5 mg by mouth daily. Reported on 03/22/2015   Yes Historical Provider, MD  sacubitril-valsartan (ENTRESTO) 24-26 MG Take 1 tablet by mouth 2 (two) times daily. 03/05/16  Yes Jolaine Artist, MD  spironolactone (ALDACTONE) 25 MG tablet Take 0.5 tablets (12.5 mg total) by mouth daily. 10/27/12  Yes Evans Lance, MD  thiamine (VITAMIN B-1) 100 MG tablet Take 1 tablet (100 mg total) by mouth 2 (two) times daily. 12/16/12  Yes Amy D Clegg, NP  traZODone (DESYREL) 100 MG tablet Take 400 mg by mouth at bedtime.  06/29/14  Yes Historical Provider, MD  warfarin (COUMADIN) 2 MG tablet Take 1-1.5 tablets (2-3 mg total) by mouth daily at 6 PM. Take 2 mg (1 tablet) daily except on Sunday, Tuesday, and Friday. Take 3 mg (1.5 tablets) on Sunday, Tuesday, and Friday Patient taking differently: Take 2-3 mg by mouth See admin instructions. Take 2 mg on Tuesday, Thursday, Saturday and Sunday then take 3 mg on Monday, Wednesday and Friday 11/24/14  Yes Domenic Polite, MD    Family History Family History  Problem Relation Age of Onset  . Aneurysm  Mother     brain  . Heart attack Father 45    Social History Social History  Substance Use Topics  . Smoking status: Never Smoker  . Smokeless tobacco: Never Used  . Alcohol use 0.0 oz/week    2 - 3 Glasses of wine per week     Comment: 2-3 glasses     Allergies   Patient has no known allergies.   Review of Systems Review of Systems  Constitutional: Negative for chills and fever.  HENT: Negative for congestion and sore throat.   Eyes: Negative for visual disturbance.  Respiratory: Positive for cough. Negative for shortness of breath and wheezing.   Cardiovascular: Positive for leg swelling. Negative for chest pain and palpitations (R>L, chronic).  Gastrointestinal: Negative for abdominal pain, constipation, diarrhea, nausea and vomiting.  Genitourinary: Negative for dysuria,  frequency and hematuria.  Musculoskeletal: Negative for arthralgias, back pain, joint swelling, myalgias, neck pain and neck stiffness.  Skin: Negative for rash.  Neurological: Negative for weakness and headaches.  Psychiatric/Behavioral: Positive for confusion (worse than baseline). Negative for agitation and behavioral problems.     Physical Exam Updated Vital Signs BP 107/65 (BP Location: Left Arm)   Pulse 69   Temp 98.2 F (36.8 C) (Oral)   Resp 18   Ht 5\' 9"  (1.753 m)   Wt 76.3 kg Comment: bedscale  SpO2 93%   BMI 24.85 kg/m   Physical Exam  Constitutional: He appears well-developed and well-nourished. No distress.  HENT:  Head: Normocephalic and atraumatic.  Eyes: Conjunctivae are normal.  Neck: Normal range of motion. Neck supple.  Cardiovascular: Normal rate and intact distal pulses.   No murmur heard. Atrial fibrillation  Pulmonary/Chest: Effort normal. No respiratory distress.  Diffuse wheezing, with scattered rales and rhonchi. Poort air movement throughout. No accessory muscle use.  Abdominal: Soft. He exhibits no distension. There is no tenderness. There is no guarding.  Musculoskeletal: He exhibits edema (2+ pitting edema to the RLE, 1+ pitting edema to the LLE, wife states is chronic).  Neurological: He is alert. He exhibits normal muscle tone.  Oriented to person and place but not to year. Knows the names of his wife and son, but is unable to say what he did for a living or recount recent events.   Skin: Skin is warm and dry. No rash noted. He is not diaphoretic.  Psychiatric: He has a normal mood and affect.  Nursing note and vitals reviewed.    ED Treatments / Results  Labs (all labs ordered are listed, but only abnormal results are displayed) Labs Reviewed  COMPREHENSIVE METABOLIC PANEL - Abnormal; Notable for the following:       Result Value   Chloride 98 (*)    Glucose, Bld 118 (*)    BUN 30 (*)    Calcium 8.6 (*)    Total Protein 6.3 (*)     ALT 15 (*)    Total Bilirubin 1.3 (*)    All other components within normal limits  CBC WITH DIFFERENTIAL/PLATELET - Abnormal; Notable for the following:    RDW 17.4 (*)    Platelets 130 (*)    All other components within normal limits  PROTIME-INR - Abnormal; Notable for the following:    Prothrombin Time 28.2 (*)    All other components within normal limits  BRAIN NATRIURETIC PEPTIDE - Abnormal; Notable for the following:    B Natriuretic Peptide >4,500.0 (*)    All other components within normal limits  TROPONIN  I - Abnormal; Notable for the following:    Troponin I 0.06 (*)    All other components within normal limits  CULTURE, BLOOD (ROUTINE X 2)  CULTURE, BLOOD (ROUTINE X 2)  URINALYSIS, ROUTINE W REFLEX MICROSCOPIC  TROPONIN I  TROPONIN I  BASIC METABOLIC PANEL  PROTIME-INR  TROPONIN I  I-STAT CG4 LACTIC ACID, ED  I-STAT CG4 LACTIC ACID, ED    EKG  EKG Interpretation None       Radiology Dg Chest 2 View  Result Date: 03/15/2016 CLINICAL DATA:  Cough and rhonchi EXAM: CHEST  2 VIEW COMPARISON:  05/09/2015 FINDINGS: Moderate cardiomegaly.  The transvenous cardiac leads appear intact. Moderate vascular and interstitial prominence. Hazy central lung opacities bilaterally. Small right pleural effusion. IMPRESSION: The findings likely represent congestive heart failure with interstitial and alveolar edema, as well a small right pleural effusion. Electronically Signed   By: Andreas Newport M.D.   On: 03/15/2016 22:21    Procedures Procedures (including critical care time)  Medications Ordered in ED Medications  allopurinol (ZYLOPRIM) tablet 300 mg (not administered)  amiodarone (PACERONE) tablet 200 mg (not administered)  aspirin EC tablet 81 mg (not administered)  carvedilol (COREG) tablet 3.125 mg (not administered)  donepezil (ARICEPT) tablet 23 mg (not administered)  fluticasone (FLONASE) 50 MCG/ACT nasal spray 2 spray (not administered)  LORazepam (ATIVAN)  tablet 0.5 mg (not administered)  memantine (NAMENDA) tablet 10 mg (not administered)  spironolactone (ALDACTONE) tablet 12.5 mg (not administered)  rosuvastatin (CRESTOR) tablet 5 mg (not administered)  thiamine (VITAMIN B-1) tablet 100 mg (not administered)  traZODone (DESYREL) tablet 400 mg (not administered)  sodium chloride flush (NS) 0.9 % injection 3 mL (not administered)  sodium chloride flush (NS) 0.9 % injection 3 mL (not administered)  0.9 %  sodium chloride infusion (not administered)  acetaminophen (TYLENOL) tablet 650 mg (not administered)  ondansetron (ZOFRAN) injection 4 mg (not administered)  furosemide (LASIX) injection 40 mg (not administered)  warfarin (COUMADIN) tablet 3 mg (not administered)  warfarin (COUMADIN) tablet 2 mg (not administered)  Warfarin - Pharmacist Dosing Inpatient (not administered)  ipratropium-albuterol (DUONEB) 0.5-2.5 (3) MG/3ML nebulizer solution 3 mL (3 mLs Nebulization Given 03/15/16 2143)  albuterol (PROVENTIL,VENTOLIN) solution continuous neb (5 mg/hr Nebulization Given 03/15/16 2213)  furosemide (LASIX) injection 40 mg (40 mg Intravenous Given 03/15/16 2250)     Initial Impression / Assessment and Plan / ED Course  I have reviewed the triage vital signs and the nursing notes.  Pertinent labs & imaging results that were available during my care of the patient were reviewed by me and considered in my medical decision making (see chart for details).     Afebrile and HDS. Wheezing, rales, and rhonchi on lung exam. Duoneb and then 1 hour of continuous albuterol neb given with improvement in wheezing. No O2 requirement here. CXR without focal consolidation to suggest PNA, but interstitial and alveolar edema and small R pleural effusion. BNP greatly elevated to >4500. Suspect CHF exacerbation. Trop mildly elevated to 0.06. In the absence of EKG changes or CP, suspect that this is in the setting of demand. Plan to admit to medicine for CHF  exacerbation. Pt admitted in stable condition.   Care of pt overseen by my attending, Dr. Sabra Heck.   Final Clinical Impressions(s) / ED Diagnoses   Final diagnoses:  Acute on chronic systolic congestive heart failure [facility])    New Prescriptions Current Discharge Medication List       Zipporah Plants, MD 03/16/16  0175    Noemi Chapel, MD 03/16/16 1028

## 2016-03-16 ENCOUNTER — Encounter (HOSPITAL_COMMUNITY): Payer: Self-pay | Admitting: *Deleted

## 2016-03-16 DIAGNOSIS — Z515 Encounter for palliative care: Secondary | ICD-10-CM | POA: Diagnosis not present

## 2016-03-16 DIAGNOSIS — I447 Left bundle-branch block, unspecified: Secondary | ICD-10-CM | POA: Diagnosis present

## 2016-03-16 DIAGNOSIS — Z8249 Family history of ischemic heart disease and other diseases of the circulatory system: Secondary | ICD-10-CM | POA: Diagnosis not present

## 2016-03-16 DIAGNOSIS — F0280 Dementia in other diseases classified elsewhere without behavioral disturbance: Secondary | ICD-10-CM | POA: Diagnosis not present

## 2016-03-16 DIAGNOSIS — L89152 Pressure ulcer of sacral region, stage 2: Secondary | ICD-10-CM | POA: Diagnosis not present

## 2016-03-16 DIAGNOSIS — Z7189 Other specified counseling: Secondary | ICD-10-CM | POA: Diagnosis not present

## 2016-03-16 DIAGNOSIS — I429 Cardiomyopathy, unspecified: Secondary | ICD-10-CM | POA: Diagnosis present

## 2016-03-16 DIAGNOSIS — G894 Chronic pain syndrome: Secondary | ICD-10-CM | POA: Diagnosis not present

## 2016-03-16 DIAGNOSIS — Z7901 Long term (current) use of anticoagulants: Secondary | ICD-10-CM | POA: Diagnosis not present

## 2016-03-16 DIAGNOSIS — Z952 Presence of prosthetic heart valve: Secondary | ICD-10-CM | POA: Diagnosis not present

## 2016-03-16 DIAGNOSIS — R4182 Altered mental status, unspecified: Secondary | ICD-10-CM | POA: Diagnosis present

## 2016-03-16 DIAGNOSIS — M7989 Other specified soft tissue disorders: Secondary | ICD-10-CM | POA: Diagnosis present

## 2016-03-16 DIAGNOSIS — F0390 Unspecified dementia without behavioral disturbance: Secondary | ICD-10-CM | POA: Diagnosis not present

## 2016-03-16 DIAGNOSIS — M109 Gout, unspecified: Secondary | ICD-10-CM | POA: Diagnosis present

## 2016-03-16 DIAGNOSIS — J9601 Acute respiratory failure with hypoxia: Secondary | ICD-10-CM

## 2016-03-16 DIAGNOSIS — I472 Ventricular tachycardia: Secondary | ICD-10-CM | POA: Diagnosis present

## 2016-03-16 DIAGNOSIS — I4891 Unspecified atrial fibrillation: Secondary | ICD-10-CM | POA: Diagnosis not present

## 2016-03-16 DIAGNOSIS — I480 Paroxysmal atrial fibrillation: Secondary | ICD-10-CM

## 2016-03-16 DIAGNOSIS — R54 Age-related physical debility: Secondary | ICD-10-CM | POA: Diagnosis present

## 2016-03-16 DIAGNOSIS — I5043 Acute on chronic combined systolic (congestive) and diastolic (congestive) heart failure: Secondary | ICD-10-CM | POA: Diagnosis not present

## 2016-03-16 DIAGNOSIS — I428 Other cardiomyopathies: Secondary | ICD-10-CM | POA: Diagnosis not present

## 2016-03-16 DIAGNOSIS — Z95810 Presence of automatic (implantable) cardiac defibrillator: Secondary | ICD-10-CM | POA: Diagnosis not present

## 2016-03-16 DIAGNOSIS — G301 Alzheimer's disease with late onset: Secondary | ICD-10-CM | POA: Diagnosis not present

## 2016-03-16 DIAGNOSIS — J9621 Acute and chronic respiratory failure with hypoxia: Secondary | ICD-10-CM | POA: Diagnosis present

## 2016-03-16 DIAGNOSIS — I5023 Acute on chronic systolic (congestive) heart failure: Secondary | ICD-10-CM | POA: Diagnosis not present

## 2016-03-16 DIAGNOSIS — R5381 Other malaise: Secondary | ICD-10-CM | POA: Diagnosis present

## 2016-03-16 DIAGNOSIS — J209 Acute bronchitis, unspecified: Secondary | ICD-10-CM | POA: Diagnosis present

## 2016-03-16 DIAGNOSIS — Z66 Do not resuscitate: Secondary | ICD-10-CM | POA: Diagnosis present

## 2016-03-16 DIAGNOSIS — I959 Hypotension, unspecified: Secondary | ICD-10-CM | POA: Diagnosis present

## 2016-03-16 DIAGNOSIS — E785 Hyperlipidemia, unspecified: Secondary | ICD-10-CM | POA: Diagnosis present

## 2016-03-16 DIAGNOSIS — E876 Hypokalemia: Secondary | ICD-10-CM | POA: Diagnosis present

## 2016-03-16 DIAGNOSIS — J181 Lobar pneumonia, unspecified organism: Secondary | ICD-10-CM | POA: Diagnosis not present

## 2016-03-16 DIAGNOSIS — I509 Heart failure, unspecified: Secondary | ICD-10-CM | POA: Diagnosis not present

## 2016-03-16 LAB — URINALYSIS, ROUTINE W REFLEX MICROSCOPIC
Bilirubin Urine: NEGATIVE
Glucose, UA: NEGATIVE mg/dL
Hgb urine dipstick: NEGATIVE
Ketones, ur: NEGATIVE mg/dL
Nitrite: NEGATIVE
Protein, ur: NEGATIVE mg/dL
Specific Gravity, Urine: 1.011 (ref 1.005–1.030)
pH: 5 (ref 5.0–8.0)

## 2016-03-16 LAB — BASIC METABOLIC PANEL
Anion gap: 7 (ref 5–15)
BUN: 26 mg/dL — ABNORMAL HIGH (ref 6–20)
CALCIUM: 8 mg/dL — AB (ref 8.9–10.3)
CO2: 30 mmol/L (ref 22–32)
CREATININE: 0.92 mg/dL (ref 0.61–1.24)
Chloride: 104 mmol/L (ref 101–111)
Glucose, Bld: 131 mg/dL — ABNORMAL HIGH (ref 65–99)
Potassium: 3.3 mmol/L — ABNORMAL LOW (ref 3.5–5.1)
SODIUM: 141 mmol/L (ref 135–145)

## 2016-03-16 LAB — PROTIME-INR
INR: 2.75
Prothrombin Time: 29.6 seconds — ABNORMAL HIGH (ref 11.4–15.2)

## 2016-03-16 LAB — TROPONIN I
TROPONIN I: 0.06 ng/mL — AB (ref <0.03)
TROPONIN I: 0.07 ng/mL — AB (ref <0.03)
Troponin I: 0.06 ng/mL (ref <0.03)

## 2016-03-16 MED ORDER — DICLOFENAC SODIUM 1 % TD GEL
2.0000 g | Freq: Two times a day (BID) | TRANSDERMAL | Status: DC | PRN
  Administered 2016-03-16 – 2016-03-17 (×2): 2 g via TOPICAL
  Filled 2016-03-16: qty 100

## 2016-03-16 MED ORDER — DOBUTAMINE IN D5W 4-5 MG/ML-% IV SOLN
2.5000 ug/kg/min | INTRAVENOUS | Status: DC
  Administered 2016-03-16: 2.5 ug/kg/min via INTRAVENOUS
  Filled 2016-03-16: qty 250

## 2016-03-16 MED ORDER — DOBUTAMINE IN D5W 4-5 MG/ML-% IV SOLN: 2.5000 ug/kg/min | INTRAVENOUS | Status: DC

## 2016-03-16 MED ORDER — FUROSEMIDE 10 MG/ML IJ SOLN: 20.0000 mg | Freq: Two times a day (BID) | INTRAMUSCULAR | Status: DC

## 2016-03-16 MED ORDER — POTASSIUM CHLORIDE CRYS ER 20 MEQ PO TBCR
40.0000 meq | EXTENDED_RELEASE_TABLET | Freq: Four times a day (QID) | ORAL | Status: AC
  Administered 2016-03-16 (×2): 40 meq via ORAL
  Filled 2016-03-16 (×2): qty 2

## 2016-03-16 MED ORDER — LEVOFLOXACIN 750 MG PO TABS
750.0000 mg | ORAL_TABLET | Freq: Every day | ORAL | Status: AC
  Administered 2016-03-16 – 2016-03-20 (×5): 750 mg via ORAL
  Filled 2016-03-16 (×5): qty 1

## 2016-03-16 MED ORDER — MILRINONE LACTATE IN DEXTROSE 20-5 MG/100ML-% IV SOLN
0.2500 ug/kg/min | INTRAVENOUS | Status: DC
  Filled 2016-03-16: qty 100

## 2016-03-16 MED ORDER — AMIODARONE HCL 200 MG PO TABS
400.0000 mg | ORAL_TABLET | Freq: Every day | ORAL | Status: DC
  Administered 2016-03-17 – 2016-03-21 (×5): 400 mg via ORAL
  Filled 2016-03-16 (×5): qty 2

## 2016-03-16 NOTE — Evaluation (Signed)
Physical Therapy Evaluation Patient Details Name: Jon Butler MRN: [id number] DOB: 06/07/1935 Today's Date: 03/16/2016   History of Present Illness  81 y.o. male with medical history significant of CHF with EF 25-30%, AICD / biventricular pacer, A.Fib, NICM, dementia.  Patient presents to the ED with family with AMS onset today.  He has had SOB, cough productive of whiteish sputum, leg swelling (R>L but he always has this) worsening over the past 2 weeks.  He was admitted for acute on chronic hypoxic respiratory failure due to CHF and bronchitis.  Clinical Impression  Patient demonstrates deficits in functional mobility as indicated below. Will need continued skilled PT to address deficits and maximize function. Will see as indicated and progress as tolerated.  OF NOTE: limited to bed level evaluation given patients medical status at this time. Patient received saturated in urine, assisted with hygiene and positioning. Prior to activity BP 92/57 with O2 saturations 94% asymptomatic. Patient assisted to EOB, BP reassessed in sitting, 86/55 O2 saturations remain 94-96%. Patient returned to bed and hygiene and peri care performed in bed.      Follow Up Recommendations SNF;Supervision/Assistance - 24 hour    Equipment Recommendations   (TBD)    Recommendations for Other Services       Precautions / Restrictions Precautions Precautions: Fall Precaution Comments: watch VS closely Restrictions Weight Bearing Restrictions: No      Mobility  Bed Mobility Overal bed mobility: Needs Assistance Bed Mobility: Rolling;Supine to Sit;Sit to Supine Rolling: Min assist   Supine to sit: Mod assist Sit to supine: Mod assist   General bed mobility comments: Assist for rolling bilateral for hygiene, increased assist to elevate trunk to upright position at EOB. And assist to elevate LEs back to bed upon return to supine  Transfers                 General transfer comment: deferred due  to medical status and labile BP at this time  Ambulation/Gait                Stairs            Wheelchair Mobility    Modified Rankin (Stroke Patients Only)       Balance Overall balance assessment: Needs assistance Sitting-balance support: Feet supported Sitting balance-Leahy Scale: Fair Sitting balance - Comments: patient able to sit EOB and perform dynamic movements without assist despite flexed posture                                     Pertinent Vitals/Pain Pain Assessment: Faces Faces Pain Scale: Hurts little more Pain Location: right upper trap Pain Descriptors / Indicators: Discomfort Pain Intervention(s): Monitored during session    Home Living Family/patient expects to be discharged to:: Private residence Living Arrangements: Spouse/significant other;Children Available Help at Discharge: Family;Available 24 hours/day Type of Home: House Home Access: Stairs to enter Entrance Stairs-Rails: Right Entrance Stairs-Number of Steps: 2 Home Layout: One level Home Equipment: Walker - 2 wheels;Bedside commode;Shower seat;Shower seat - built in Additional Comments: Patient unreliable historian and no family available; info gleaned from previous admission    Prior Function Level of Independence: Independent               Hand Dominance   Dominant Hand: Right    Extremity/Trunk Assessment   Upper Extremity Assessment Upper Extremity Assessment: Generalized weakness    Lower Extremity Assessment Lower  Extremity Assessment: Generalized weakness       Communication   Communication: HOH  Cognition Arousal/Alertness: Awake/alert Behavior During Therapy: Flat affect Overall Cognitive Status: Impaired/Different from baseline Area of Impairment: Awareness;Problem solving           Awareness: Emergent Problem Solving: Slow processing;Difficulty sequencing;Requires verbal cues;Requires tactile cues      General Comments       Exercises     Assessment/Plan    PT Assessment Patient needs continued PT services  PT Problem List Decreased strength;Decreased range of motion;Decreased activity tolerance;Decreased balance;Decreased mobility;Decreased cognition;Decreased safety awareness;Cardiopulmonary status limiting activity;Pain       PT Treatment Interventions      PT Goals (Current goals can be found in the Care Plan section)  Acute Rehab PT Goals Patient Stated Goal: none stated PT Goal Formulation: With patient Time For Goal Achievement: 03/30/16 Potential to Achieve Goals: Good    Frequency Min 2X/week   Barriers to discharge        Co-evaluation               End of Session   Activity Tolerance: Treatment limited secondary to medical complications (Comment) (bed level eval due to pt medical status labile BP) Patient left: in bed;with call bell/phone within reach;with nursing/sitter in room Nurse Communication: Mobility status PT Visit Diagnosis: Muscle weakness (generalized) (M62.81)    Functional Assessment Tool Used: Clinical judgement Functional Limitation: Mobility: Walking and moving around Mobility: Walking and Moving Around Current Status (E1007): At least 40 percent but less than 60 percent impaired, limited or restricted Mobility: Walking and Moving Around Goal Status [phone]): At least 1 percent but less than 20 percent impaired, limited or restricted    Time: 1716-1737 PT Time Calculation (min) (ACUTE ONLY): 21 min   Charges:   PT Evaluation $PT Eval Moderate Complexity: 1 Procedure     PT G Codes:   PT G-Codes **NOT FOR INPATIENT CLASS** Functional Assessment Tool Used: Clinical judgement Functional Limitation: Mobility: Walking and moving around Mobility: Walking and Moving Around Current Status (J8832): At least 40 percent but less than 60 percent impaired, limited or restricted Mobility: Walking and Moving Around Goal Status [phone]): At least 1 percent but  less than 20 percent impaired, limited or restricted     Duncan Dull 03/16/2016, 6:38 PM  Alben Deeds, [place] DPT  [phone]

## 2016-03-16 NOTE — Progress Notes (Signed)
Pt has a BP 80/50, checked repeated times, even manually reading still low 78/50, DR B ordered Milrinone but it is in hold right now due to the reason of low BP, cardiologist and MD paged regarding this, pt is asymptomatic, verbalized that "he is fine", will continue to monitor the patient and will follow the new orders.

## 2016-03-16 NOTE — Progress Notes (Signed)
Received to [facility] via pt bed from [facility]. Dobutamine at 2.25mcg/kg/min or 2.9 ml/hr via Left wrist IV. Oriented to room and call bell in reach. Son into room at bedside.

## 2016-03-16 NOTE — Progress Notes (Signed)
Pharmacy Antibiotic Note  KHAYMAN KIRSCH is a 81 y.o. male admitted on 03/15/2016 with community-acquired  pneumonia.  Pharmacy has been consulted for levofloxacin dosing. Of note, patient was taking amoxicillin prior to admission with little relief. WBC within normal limits and patient afebrile. Scr around 1 with estimated clearance ~ 60 ml/min.  Plan: Levaquin 750 mg every 24 hours Monitor renal function Monitor clinical s/sx of infection , length of therapy   Height: 5\' 9"  (175.3 cm) Weight: 168 lb 4.8 oz (76.3 kg) (bedscale) IBW/kg (Calculated) : 70.7  Temp (24hrs), Avg:98.6 F (37 C), Min:98.2 F (36.8 C), Max:98.9 F (37.2 C)   Recent Labs Lab 03/15/16 2136 03/15/16 2150 03/16/16 0324  WBC 9.3  --   --   CREATININE 1.06  --  0.92  LATICACIDVEN  --  1.55  --     Estimated Creatinine Clearance: 66.2 mL/min (by C-G formula based on SCr of 0.92 mg/dL).    No Known Allergies  Antimicrobials this admission: 3/11 Levaquin>>   Microbiology results: 3/10  BCx: In process   Thank you for allowing pharmacy to be a part of this patient's care.  Ihor Austin, PharmD PGY1 Pharmacy Resident Pager: [phone] 03/16/2016 11:07 AM

## 2016-03-16 NOTE — Progress Notes (Signed)
Pt is transferred to [facility] with all of his belongings in a stable condition. His Son accompanied with him. Report is provided to the receiving RN, telemetry called regarding patient being transferred.  Palma Holter, RN

## 2016-03-16 NOTE — Progress Notes (Signed)
Helped patient to get out of the bed and sit on a recliner chair, pt ate his lunch while in a recliner, RT taught him how to use the flutter valve, as soon as RT got out of the room, pt forget while RN asked him to use it, RN taught patient again using Flutter valve, pt did fine with that Pt's son is in bed side, pt is complaining of right shoulder pain especially the muscle blade on the shoulder site, will address this problem to the MD and will go from there, will continue to monitor the patient.  Palma Holter, RN

## 2016-03-16 NOTE — Progress Notes (Signed)
After starting dobutamine, pt's BP is still running soft 88/65, MD paged and Cardiologist paged, Lasix is in hold right now for 6pm, pt is pleasantly confused, RN keep checking BP and with patient most of the time throughout the day,will continue to monitor

## 2016-03-16 NOTE — Progress Notes (Signed)
Report provided to [employer], pt is transferring to higher level of care for further treatment.

## 2016-03-16 NOTE — Discharge Instructions (Signed)

## 2016-03-16 NOTE — Progress Notes (Addendum)
ANTICOAGULATION CONSULT NOTE - Initial Consult  Pharmacy Consult for Coumadin Indication: atrial fibrillation  No Known Allergies  Patient Measurements: Height: 5\' 9"  (175.3 cm) Weight: 168 lb 4.8 oz (76.3 kg) (bedscale) IBW/kg (Calculated) : 70.7  Vital Signs: Temp: 98.9 F (37.2 C) (03/11 0635) Temp Source: Oral (03/11 0635) BP: 90/65 (03/11 0635) Pulse Rate: 60 (03/11 0635)  Labs:  Recent Labs  03/15/16 2136 03/15/16 2230 03/16/16 0324 03/16/16 0852  HGB 13.2  --   --   --   HCT 41.5  --   --   --   PLT 130*  --   --   --   LABPROT 28.2*  --  29.6*  --   INR 2.58  --  2.75  --   CREATININE 1.06  --  0.92  --   TROPONINI  --  0.06* 0.06* 0.07*    Estimated Creatinine Clearance: 66.2 mL/min (by C-G formula based on SCr of 0.92 mg/dL).   Medical History: Past Medical History:  Diagnosis Date  . Atrial fibrillation ([place])   . Cardiomyopathy   . Chronic systolic heart failure ([place])   . Dementia   . Left bundle-branch block   . Pneumonia 08/2014    Assessment: 81yo male presents w/ increased confusion (worsened from Alzhemier's baseline), admitted for further w/u, to continue Coumadin for Afib; current INR at goal with last dose taken 3/10 at home. CBC is within normal limits.  No signs of bleeding noted. DDI with Levaquin- Could increase INR. Will monitor.  PTA dose: 2 mg STRS                   3 mg MWF per coumadin clinic  Goal of Therapy:  INR 2-3   Plan:  Will continue home Coumadin dose of 2mg  TTSS and 3mg  MWF. Daily INR Monitor for signs/symptoms of bleeding.  Uvaldo Bristle, PharmD PGY1 Pharmacy Resident Pager: [phone] 03/16/2016,10:55 AM

## 2016-03-16 NOTE — Consult Note (Addendum)
Patient ID: Jon Butler MRN: [id number], DOB/AGE: 03/13/1935   Admit date: 03/15/2016   Reason for Consult: CHF Requesting MD: Dr. Candiss Norse, Internal Medicine    Primary Physician: [name], MD Primary Cardiologist: Dr. Haroldine Laws   Pt. Profile:  81 yo male with a history of chronic systolic HF EF 28% due to NICM s/p CRT-D (Medtronic), mechanical AVR (St Jude), chronic coumadin therapy, LBBB, PAF,  ETOH abuse, dementia and h/o non-compliance. In August 2017 while admitted for ARF in the setting of PNA and recurrent AF, he was made DNR and his ICD was deactivated. Presented to ED 03/15/16 with hypoxia in the setting of recent URI and found to be in acute on chronic systolic CHF, for which cardiology has been consulted.   Problem List  Past Medical History:  Diagnosis Date  . Atrial fibrillation ([place])   . Cardiomyopathy   . Chronic systolic heart failure ([place])   . Dementia   . Left bundle-branch block   . Pneumonia 08/2014    Past Surgical History:  Procedure Laterality Date  . AORTIC VALVE REPLACEMENT    . BOTOX INJECTION N/A 08/18/2014   Procedure: BOTOX INJECTION;  Surgeon: Wonda Horner, MD;  Location: [facility] ENDOSCOPY;  Service: Endoscopy;  Laterality: N/A;  . CARDIOVERSION N/A 10/25/2012   Procedure: CARDIOVERSION;  Surgeon: Evans Lance, MD;  Location: [place];  Service: Cardiovascular;  Laterality: N/A;  . CARDIOVERSION N/A 12/20/2012   Procedure: CARDIOVERSION;  Surgeon: Larey Dresser, MD;  Location: [place];  Service: Cardiovascular;  Laterality: N/A;  . CARDIOVERSION N/A 08/10/2014   Procedure: CARDIOVERSION;  Surgeon: Sanda Klein, MD;  Location: [facility] ENDOSCOPY;  Service: Cardiovascular;  Laterality: N/A;  . DOPPLER ECHOCARDIOGRAPHY  2008, 2009  . ESOPHAGOGASTRODUODENOSCOPY (EGD) WITH PROPOFOL N/A 08/18/2014   Procedure: ESOPHAGOGASTRODUODENOSCOPY (EGD) WITH PROPOFOL;  Surgeon: Wonda Horner, MD;  Location: [facility] ENDOSCOPY;  Service: Endoscopy;  Laterality: N/A;    . IMPLANTABLE CARDIOVERTER DEFIBRILLATOR GENERATOR CHANGE N/A 09/20/2012   Procedure: IMPLANTABLE CARDIOVERTER DEFIBRILLATOR GENERATOR CHANGE;  Surgeon: Evans Lance, MD;  Location: [facility] CATH LAB;  Service: Cardiovascular;  Laterality: N/A;  . TEE WITHOUT CARDIOVERSION N/A 10/15/2012   Procedure: TRANSESOPHAGEAL ECHOCARDIOGRAM (TEE);  Surgeon: Fay Records, MD;  Location: [facility] ENDOSCOPY;  Service: Cardiovascular;  Laterality: N/A;  . TEE WITHOUT CARDIOVERSION N/A 08/10/2014   Procedure: TRANSESOPHAGEAL ECHOCARDIOGRAM (TEE);  Surgeon: Sanda Klein, MD;  Location: [facility] ENDOSCOPY;  Service: Cardiovascular;  Laterality: N/A;     Allergies  No Known Allergies  HPI  81 yo male with a history of chronic systolic HF EF 36% due to NICM s/p CRT-D (Medtronic), mechanical AVR (St Jude), chronic coumadin therapy, LBBB, PAF,  ETOH abuse, dementia and h/o non-compliance. In August 2017 while admitted for ARF in the setting of PNA and recurrent AF, he was made DNR and his ICD was deactivated. He is followed by the Advanced HF team.  He presented to the ED 03/15/16 with hypoxia and found to be in acute on chronic systolic CHF, for which cardiology has been consulted. His son reports he has been dealing with an URI for the last week. Yesterday, he had a coughing spell. His wife check his O2 sats at home and levels were low in the low 80s. His son advised that he go to the ED. Work up in the ED  strongly suggestive of CHF.  CXR showed pulmonary edema + a small right pleural effusion. BNP >4,500, which is markedly above his baseline. Interestingly,  he is not above his recommended dry weight. Based on most recent progress note by Dr. Haroldine Laws from 03/05/16, reported dry weight is between 164-168 lb. His recorded weight today is 168 lb. Scr is WNL at 0.92. K is low at 3.3. Troponin slightly abnormal with flat trend c/w demand ischemia at 0.06>>0.06. EKG shows rate controlled Afib with PVCs.   He has been admitted by IM and  placed on IV Lasix, 40 mg BID. Only 175 cc recorded for UOP. He has a foley. His son reports he has been sleeping a lot. No complaints of chest pain.   Home Medications  Prior to Admission medications   Medication Sig Start Date End Date Taking? Authorizing Provider  allopurinol (ZYLOPRIM) 300 MG tablet Take 300 mg by mouth daily.    Yes Historical Provider, MD  amiodarone (PACERONE) 200 MG tablet Take 1 tablet (200 mg total) by mouth daily. 01/28/16  Yes Jolaine Artist, MD  amoxicillin (AMOXIL) 500 MG capsule Take 500 mg by mouth 3 (three) times daily.    Yes Historical Provider, MD  aspirin EC 81 MG tablet Take 1 tablet (81 mg total) by mouth daily. 10/26/12  Yes Brooke O Edmisten, PA-C  carvedilol (COREG) 3.125 MG tablet Take 3.125 mg by mouth 2 (two) times daily with a meal.   Yes Historical Provider, MD  colchicine 0.6 MG tablet Take 0.6 mg by mouth daily as needed (gout pain). Reported on 12/21/2014   Yes Historical Provider, MD  donepezil (ARICEPT) 23 MG TABS tablet Take 23 mg by mouth 2 (two) times daily.   Yes Historical Provider, MD  fluticasone (FLONASE) 50 MCG/ACT nasal spray Place 2 sprays into both nostrils daily as needed for allergies or rhinitis. Reported on 12/21/2014 08/17/13  Yes Historical Provider, MD  furosemide (LASIX) 40 MG tablet Take 1 tablet (40 mg total) by mouth daily. If weight is below 164lbs take 40mg  every other day. Patient taking differently: Take 40 mg by mouth daily.  11/16/15  Yes Larey Dresser, MD  LORazepam (ATIVAN) 0.5 MG tablet Take 0.5 mg by mouth at bedtime.   Yes Historical Provider, MD  memantine (NAMENDA) 10 MG tablet Take 1 tablet by mouth 2 (two) times daily. 11/12/15  Yes Historical Provider, MD  rosuvastatin (CRESTOR) 10 MG tablet Take 5 mg by mouth daily. Reported on 03/22/2015   Yes Historical Provider, MD  sacubitril-valsartan (ENTRESTO) 24-26 MG Take 1 tablet by mouth 2 (two) times daily. 03/05/16  Yes Jolaine Artist, MD  spironolactone  (ALDACTONE) 25 MG tablet Take 0.5 tablets (12.5 mg total) by mouth daily. 10/27/12  Yes Evans Lance, MD  thiamine (VITAMIN B-1) 100 MG tablet Take 1 tablet (100 mg total) by mouth 2 (two) times daily. 12/16/12  Yes Amy D Clegg, NP  traZODone (DESYREL) 100 MG tablet Take 400 mg by mouth at bedtime.  06/29/14  Yes Historical Provider, MD  warfarin (COUMADIN) 2 MG tablet Take 1-1.5 tablets (2-3 mg total) by mouth daily at 6 PM. Take 2 mg (1 tablet) daily except on Sunday, Tuesday, and Friday. Take 3 mg (1.5 tablets) on Sunday, Tuesday, and Friday Patient taking differently: Take 2-3 mg by mouth See admin instructions. Take 2 mg on Tuesday, Thursday, Saturday and Sunday then take 3 mg on Monday, Wednesday and Friday 11/24/14  Yes Domenic Polite, [place]  . allopurinol  300 mg Oral Daily  . amiodarone  200 mg Oral Daily  . aspirin EC  81  mg Oral Daily  . carvedilol  3.125 mg Oral BID WC  . donepezil  23 mg Oral BID  . furosemide  40 mg Intravenous BID  . LORazepam  0.5 mg Oral QHS  . memantine  10 mg Oral BID  . potassium chloride  40 mEq Oral Q6H  . rosuvastatin  5 mg Oral q1800  . sodium chloride flush  3 mL Intravenous Q12H  . spironolactone  12.5 mg Oral Daily  . thiamine  100 mg Oral BID  . traZODone  400 mg Oral QHS  . warfarin  2 mg Oral Q T,Th,S,Su-1800  . [START ON 03/17/2016] warfarin  3 mg Oral Q M,W,F-1800  . Warfarin - Pharmacist Dosing Inpatient   Does not apply q1800   Family History  Family History  Problem Relation Age of Onset  . Aneurysm Mother     brain  . Heart attack Father 27    Social History  Social History   Social History  . Marital status: Married    Spouse name: [name]  . Number of children: 5  . Years of education: 12   Occupational History  . retired Retired   Social History Main Topics  . Smoking status: Never Smoker  . Smokeless tobacco: Never Used  . Alcohol use 0.0 oz/week    2 - 3 Glasses of wine per week     Comment: 2-3  glasses  . Drug use: No  . Sexual activity: No   Other Topics Concern  . Not on file   Social History Narrative   Patient is married [name]).   Patient is retired.   5 children    12th grade   2 cups caffeine daily     Review of Systems General:  No chills, fever, night sweats or weight changes.  Cardiovascular:  No chest pain, dyspnea on exertion, edema, orthopnea, palpitations, paroxysmal nocturnal dyspnea. Dermatological: No rash, lesions/masses Respiratory: No cough, dyspnea Urologic: No hematuria, dysuria Abdominal:   No nausea, vomiting, diarrhea, bright red blood per rectum, melena, or hematemesis Neurologic:  No visual changes, wkns, changes in mental status. All other systems reviewed and are otherwise negative except as noted above.  Physical Exam  Blood pressure 90/65, pulse 60, temperature 98.9 F (37.2 C), temperature source Oral, resp. rate 18, height 5\' 9"  (1.753 m), weight 168 lb 4.8 oz (76.3 kg), SpO2 93 %.  General: NAD, sleeping  Psych: Normal affect. Neuro: Alert and oriented X 3. Moves all extremities spontaneously. HEENT: Normal  Neck: Supple without bruits. JVP hard to see probably 9-10 Lungs:  Resp regular and unlabored, CTA. Heart: irregular rthtyhm, regular rate + s3. Mechanical S2 Abdomen: Soft, non-tender, +distended, BS + x 4.  Extremities: No clubbing 2+ bilateral pedal edema. DP/PT/Radials 2+ and equal bilaterally.  Labs  Troponin (Point of Care Test) No results for input(s): TROPIPOC in the last 72 hours.  Recent Labs  03/15/16 2230 03/16/16 0324  TROPONINI 0.06* 0.06*   Lab Results  Component Value Date   WBC 9.3 03/15/2016   HGB 13.2 03/15/2016   HCT 41.5 03/15/2016   MCV 96.7 03/15/2016   PLT 130 (L) 03/15/2016    Recent Labs Lab 03/15/16 2136 03/16/16 0324  NA 140 141  K 3.9 3.3*  CL 98* 104  CO2 30 30  BUN 30* 26*  CREATININE 1.06 0.92  CALCIUM 8.6* 8.0*  PROT 6.3*  --   BILITOT 1.3*  --   ALKPHOS 93  --  ALT 15*  --   AST 20  --   GLUCOSE 118* 131*   Lab Results  Component Value Date   CHOL  03/29/2007    183        ATP III CLASSIFICATION:  <200     mg/dL   Desirable  200-239  mg/dL   Borderline High  >=240    mg/dL   High   HDL 50 03/29/2007   LDLCALC (H) 03/29/2007    106        Total Cholesterol/HDL:CHD Risk Coronary Heart Disease Risk Table                     Men   Women  1/2 Average Risk   3.4   3.3   TRIG 133 03/29/2007   No results found for: DDIMER   Radiology/Studies  Dg Chest 2 View  Result Date: 03/15/2016 CLINICAL DATA:  Cough and rhonchi EXAM: CHEST  2 VIEW COMPARISON:  05/09/2015 FINDINGS: Moderate cardiomegaly.  The transvenous cardiac leads appear intact. Moderate vascular and interstitial prominence. Hazy central lung opacities bilaterally. Small right pleural effusion. IMPRESSION: The findings likely represent congestive heart failure with interstitial and alveolar edema, as well a small right pleural effusion. Electronically Signed   By: Andreas Newport M.D.   On: 03/15/2016 22:21    ECG  Rate controlled atrial fibrillation with PVCs    ASSESSMENT AND PLAN  81 yo male with a history of chronic systolic HF EF 15% due to NICM s/p CRT-D (Medtronic), mechanical AVR (St Jude), chronic coumadin therapy, LBBB, PAF,  ETOH abuse, dementia and h/o non-compliance. In August 2017 while admitted for ARF in the setting of PNA and recurrent AF, he was made DNR and his ICD was deactivated. He presented to ED 03/15/16 with hypoxia, in the setting of recent URI and found to be in acute on chronic systolic CHF, for which cardiology has been consulted. CXR showed pulmonary edema + a small right pleural effusion. BNP >4,500, which is markedly above his baseline. He his followed in the advanced HF Clinic by Dr. Haroldine Laws. He has agreed to see him today. Continue Lasix. Monitor UOP. Dr. Haroldine Laws to provide further recommendations.   Signed, Lyda Jester,  PA-C 03/16/2016, 9:33 AM   Patient seen and examined with Lyda Jester PA. We discussed all aspects of the encounter. I agree with the assessment and plan as stated above.   Assessment: 1. Acute on chronic systolic HF with probable low output   NICM EF 20% 2. PAF 3. Acute respiratory failure with possible RLL infiltrate 4. Mechanical AVR 5. Hupokalemia 6. DNR  CXR concerning for possible RLL infiltrate and parapneumonic effusion. But I suspect he also has recurrent low output HF with profound fatigue, volume overload and markedly elevated BNP in the setting of recurrent AF. He has had similar presentations in past when he had URI which triggered his AF and then developed severe HF. I had a long talk with his son today. Mr. Gadd has had a marked deterioration in his functional status over past few months and is now unable to provide much care for himself and it has been very difficult for his wife. He brought up the concept of re-engaging the Palliative Care team which I think is very appropriate.   For now, will give a trial of milrinone and increasing amio (to support return of NSR) and see how he does. Will interrogate device in am. Plan to have family meeting with  him and his wife tomorrow to determine next steps. Continue coumadin for AF and mechanical AVR. Can consider CT to further evaluate RLL process as needed. I would favor starting abx.    Jolayne Branson,MD 1:01 PM   Addendum: SBP 70-80. Will switch milrinone to dobutamine.   Wilhelmina Hark,MD 3:56 PM

## 2016-03-16 NOTE — Progress Notes (Signed)
PROGRESS NOTE                                                                                                                                                                                                             Patient Demographics:    Jon Butler, is a 81 y.o. male, DOB - 1935-05-19, IHW:[id number]  Admit date - 03/15/2016   Admitting Physician Etta Quill, DO  Outpatient Primary MD for the patient is [name], MD  LOS - 0  Chief Complaint  Patient presents with  . Cough  . Altered Mental Status  . Leg Swelling       Brief Narrative   OREY MOURE is a 81 y.o. male with medical history significant of CHF with EF 25-30%, AICD / biventricular pacer, A.Fib, NICM, dementia.  Patient presents to the ED with family with AMS onset today.  He has had SOB, cough productive of whiteish sputum, leg swelling (R>L but he always has this) worsening over the past 2 weeks.  He was admitted for acute on chronic hypoxic respiratory failure due to CHF and bronchitis.   Subjective:    Hortencia Pilar today has, No headache, No chest pain, No abdominal pain - No Nausea, No new weakness tingling or numbness, +ve Cough & SOB.    Assessment  & Plan :    1.Acute on chronic hypoxic respiratory failure due to acute on chronic combined systolic and diastolic heart failure last EF around 25% with acute bronchitis.  Cardiology following, he is on IV Lasix along with beta blocker, and Aldactone, blood pressure prohibits use of ACE/ARB, continue gentle diuresis and monitor closely. Placed on salt and fluid restriction, daily weights, intake and output monitoring.    Filed Weights   03/15/16 2129 03/16/16 0014  Weight: 74.8 kg (165 lb) 76.3 kg (168 lb 4.8 oz)    2. Nonischemic cardiomyopathy with EF around 25%. Treatment hasn't #1 above. He has AICD with biventricular pacemaker as well.  3. Paroxysmal atrial fibrillation,  mechanical AVR St. Jude. [place] vasc 2 score of at least 4. On amiodarone along with Coreg, pharmacy monitoring Coumadin and INR. Goal INR 2.5-3.5. Currently at goal.  4. Acute bronchitis. Check sputum Gram stain and culture, place on oral Levaquin for now. Added flutter valve for pulmonary toiletry.  5. Mild underlying dementia. Remains at  risk for delirium, minimize narcotics and benzodiazepines. Continue home medications along with supportive care.  6. Gout. On allopurinol continue.  7. Dyslipidemia. Continue statin.   Diet : Diet Heart Room service appropriate? Yes; Fluid consistency: Thin; Fluid restriction: 1500 mL Fluid    Family Communication  :  wife  Code Status :  DNR  Disposition Plan  :  TBD  Consults  :  Cards  Procedures  :    DVT Prophylaxis  :  Coumadin  Lab Results  Component Value Date   INR 2.75 03/16/2016   INR 2.58 03/15/2016   INR 2.7 03/12/2016    Lab Results  Component Value Date   PLT 130 (L) 03/15/2016    Inpatient Medications  Scheduled Meds: . allopurinol  300 mg Oral Daily  . amiodarone  200 mg Oral Daily  . aspirin EC  81 mg Oral Daily  . carvedilol  3.125 mg Oral BID WC  . donepezil  23 mg Oral BID  . furosemide  40 mg Intravenous BID  . LORazepam  0.5 mg Oral QHS  . memantine  10 mg Oral BID  . potassium chloride  40 mEq Oral Q6H  . rosuvastatin  5 mg Oral q1800  . sodium chloride flush  3 mL Intravenous Q12H  . spironolactone  12.5 mg Oral Daily  . thiamine  100 mg Oral BID  . traZODone  400 mg Oral QHS  . warfarin  2 mg Oral Q T,Th,S,Su-1800  . [START ON 03/17/2016] warfarin  3 mg Oral Q M,W,F-1800  . Warfarin - Pharmacist Dosing Inpatient   Does not apply q1800   Continuous Infusions: PRN Meds:.sodium chloride, acetaminophen, fluticasone, ondansetron (ZOFRAN) IV, sodium chloride flush  Antibiotics  :    Anti-infectives    None         Objective:   Vitals:   03/15/16 2315 03/15/16 2330 03/16/16 0014 03/16/16 0635    BP: 100/67 103/67 107/65 90/65  Pulse: 61 (!) 58 69 60  Resp: 18 17 18 18   Temp:   98.2 F (36.8 C) 98.9 F (37.2 C)  TempSrc:   Oral Oral  SpO2: 100%  93% 93%  Weight:   76.3 kg (168 lb 4.8 oz)   Height:   5\' 9"  (1.753 m)     Wt Readings from Last 3 Encounters:  03/16/16 76.3 kg (168 lb 4.8 oz)  03/05/16 77.1 kg (170 lb)  12/18/15 78.7 kg (173 lb 8 oz)     Intake/Output Summary (Last 24 hours) at 03/16/16 1100 Last data filed at 03/16/16 0014  Gross per 24 hour  Intake              240 ml  Output              175 ml  Net               65 ml     Physical Exam  Awake Alert, Oriented X 3, No new F.N deficits, Normal affect [place].AT,PERRAL Supple Neck,No JVD, No cervical lymphadenopathy appriciated.  Symmetrical Chest wall movement, Good air movement bilaterally, CTAB RRR,No Gallops,Rubs or new Murmurs, No Parasternal Heave +ve B.Sounds, Abd Soft, No tenderness, No organomegaly appriciated, No rebound - guarding or rigidity. No Cyanosis, Clubbing or edema, No new Rash or bruise     Data Review:    CBC  Recent Labs Lab 03/15/16 2136  WBC 9.3  HGB 13.2  HCT 41.5  PLT 130*  MCV 96.7  MCH 30.8  MCHC 31.8  RDW 17.4*  LYMPHSABS 1.6  MONOABS 0.9  EOSABS 0.0  BASOSABS 0.0    Chemistries   Recent Labs Lab 03/15/16 2136 03/16/16 0324  NA 140 141  K 3.9 3.3*  CL 98* 104  CO2 30 30  GLUCOSE 118* 131*  BUN 30* 26*  CREATININE 1.06 0.92  CALCIUM 8.6* 8.0*  AST 20  --   ALT 15*  --   ALKPHOS 93  --   BILITOT 1.3*  --    ------------------------------------------------------------------------------------------------------------------ No results for input(s): CHOL, HDL, LDLCALC, TRIG, CHOLHDL, LDLDIRECT in the last 72 hours.  Lab Results  Component Value Date   HGBA1C  03/29/2007    5.4 (NOTE)   The ADA recommends the following therapeutic goals for glycemic   control related to Hgb A1C measurement:   Goal of Therapy:   < 7.0% Hgb A1C   Action  Suggested:  > 8.0% Hgb A1C   Ref:  Diabetes Care, 22, Suppl. 1, 1999   ------------------------------------------------------------------------------------------------------------------ No results for input(s): TSH, T4TOTAL, T3FREE, THYROIDAB in the last 72 hours.  Invalid input(s): FREET3 ------------------------------------------------------------------------------------------------------------------ No results for input(s): VITAMINB12, FOLATE, FERRITIN, TIBC, IRON, RETICCTPCT in the last 72 hours.  Coagulation profile  Recent Labs Lab 03/12/16 1409 03/15/16 2136 03/16/16 0324  INR 2.7 2.58 2.75    No results for input(s): DDIMER in the last 72 hours.  Cardiac Enzymes  Recent Labs Lab 03/15/16 2230 03/16/16 0324 03/16/16 0852  TROPONINI 0.06* 0.06* 0.07*   ------------------------------------------------------------------------------------------------------------------    Component Value Date/Time   BNP >4,500.0 (H) 03/15/2016 2152    Micro Results No results found for this or any previous visit (from the past 240 hour(s)).  Radiology Reports Dg Chest 2 View  Result Date: 03/15/2016 CLINICAL DATA:  Cough and rhonchi EXAM: CHEST  2 VIEW COMPARISON:  05/09/2015 FINDINGS: Moderate cardiomegaly.  The transvenous cardiac leads appear intact. Moderate vascular and interstitial prominence. Hazy central lung opacities bilaterally. Small right pleural effusion. IMPRESSION: The findings likely represent congestive heart failure with interstitial and alveolar edema, as well a small right pleural effusion. Electronically Signed   By: Andreas Newport M.D.   On: 03/15/2016 22:21    Time Spent in minutes  30   Kadir Azucena K M.D on 03/16/2016 at 11:00 AM  Between 7am to 7pm - Pager - [phone]  After 7pm go to [email or web address] - password [facility]  [facility] -  Office  [phone]

## 2016-03-17 ENCOUNTER — Inpatient Hospital Stay (HOSPITAL_COMMUNITY): Payer: Medicare Other

## 2016-03-17 DIAGNOSIS — I509 Heart failure, unspecified: Secondary | ICD-10-CM

## 2016-03-17 LAB — ECHOCARDIOGRAM COMPLETE
AOPV: 0.47 m/s
AOVTI: 28 cm
AV Area VTI index: 1.28 cm2/m2
AV Area VTI: 2.33 cm2
AV VEL mean LVOT/AV: 0.46
AV area mean vel ind: 1.18 cm2/m2
AV peak Index: 1.21
AV vel: 2.46
AVAREAMEANV: 2.27 cm2
AVG: 7 mmHg
AVPG: 13 mmHg
AVPKVEL: 177 cm/s
DOP CAL AO MEAN VELOCITY: 118 cm/s
FS: 6 % — AB (ref 28–44)
HEIGHTINCHES: 69 in
IV/PV OW: 0.86
LA diam end sys: 45 mm
LA diam index: 2.34 cm/m2
LA vol index: 56.8 mL/m2
LA vol: 109 mL
LASIZE: 45 mm
LAVOLA4C: 107 mL
LVOT SV: 69 mL
LVOT VTI: 14 cm
LVOT area: 4.91 cm2
LVOT peak VTI: 0.5 cm
LVOT peak vel: 83.9 cm/s
LVOTD: 25 mm
PW: 9.43 mm — AB (ref 0.6–1.1)
Valve area index: 1.28
Valve area: 2.46 cm2
WEIGHTICAEL: 2704 [oz_av]

## 2016-03-17 LAB — PROTIME-INR
INR: 3.7
PROTHROMBIN TIME: 37.6 s — AB (ref 11.4–15.2)

## 2016-03-17 LAB — BASIC METABOLIC PANEL
ANION GAP: 6 (ref 5–15)
BUN: 24 mg/dL — ABNORMAL HIGH (ref 6–20)
CALCIUM: 8 mg/dL — AB (ref 8.9–10.3)
CO2: 31 mmol/L (ref 22–32)
Chloride: 100 mmol/L — ABNORMAL LOW (ref 101–111)
Creatinine, Ser: 0.93 mg/dL (ref 0.61–1.24)
GLUCOSE: 91 mg/dL (ref 65–99)
Potassium: 4.1 mmol/L (ref 3.5–5.1)
SODIUM: 137 mmol/L (ref 135–145)

## 2016-03-17 LAB — MAGNESIUM: MAGNESIUM: 2 mg/dL (ref 1.7–2.4)

## 2016-03-17 MED ORDER — FUROSEMIDE 10 MG/ML IJ SOLN
40.0000 mg | Freq: Two times a day (BID) | INTRAMUSCULAR | Status: DC
  Administered 2016-03-17: 40 mg via INTRAVENOUS
  Filled 2016-03-17: qty 4

## 2016-03-17 MED ORDER — METOLAZONE 5 MG PO TABS
2.5000 mg | ORAL_TABLET | Freq: Once | ORAL | Status: AC
  Administered 2016-03-17: 2.5 mg via ORAL
  Filled 2016-03-17: qty 1

## 2016-03-17 NOTE — Progress Notes (Signed)
PROGRESS NOTE                                                                                                                                                                                                             Patient Demographics:    Jon Butler, is a 81 y.o. male, DOB - May 27, 1935, TML:[id number]  Admit date - 03/15/2016   Admitting Physician Etta Quill, DO  Outpatient Primary MD for the patient is [name], MD  LOS - 1  Chief Complaint  Patient presents with  . Cough  . Altered Mental Status  . Leg Swelling       Brief Narrative   Jon Butler is a 81 y.o. male with medical history significant of CHF with EF 25-30%, AICD / biventricular pacer, A.Fib, NICM, dementia.  Patient presents to the ED with family with AMS onset today.  He has had SOB, cough productive of whiteish sputum, leg swelling (R>L but he always has this) worsening over the past 2 weeks.  He was admitted for acute on chronic hypoxic respiratory failure due to CHF and bronchitis.   Subjective:    Jon Butler today has, No headache, No chest pain, No abdominal pain - No Nausea, No new weakness tingling or numbness, +ve Cough & SOB.    Assessment  & Plan :    1.Acute on chronic hypoxic respiratory failure due to acute on chronic combined systolic and dCHF last EF around 25% with acute bronchitis.  Cardiology following, he is on IV Lasix along with beta blocker and Aldactone, Will give one dose of Zaroxolyn by mouth on 03/17/2016 to augment urinary output blood pressure prohibits use of ACE/ARB, continue gentle diuresis and monitor closely. Placed on salt and fluid restriction, daily weights, intake and output monitoring.    Filed Weights   03/15/16 2129 03/16/16 0014 03/17/16 0350  Weight: 74.8 kg (165 lb) 76.3 kg (168 lb 4.8 oz) 76.7 kg (169 lb)    2. Nonischemic cardiomyopathy with EF around 25%. Treatment hasn't #1 above.  He has AICD with biventricular pacemaker as well.  3. Paroxysmal atrial fibrillation, mechanical AVR [employer]. [place] vasc 2 score of at least 4. On amiodarone along with Coreg, pharmacy monitoring Coumadin and INR. Goal INR 2.5-3.5. Currently at goal.  4. Acute bronchitis. Check sputum Gram stain and culture, place  on oral Levaquin for now. Added flutter valve for pulmonary toiletry. Encourage sitting up in chair and increase activity likely discharge in the morning.  5. Mild underlying dementia. Remains at risk for delirium, minimize narcotics and benzodiazepines. Continue home medications along with supportive care.  6. Gout. On allopurinol continue.  7. Dyslipidemia. Continue statin.   Diet : Diet Heart Room service appropriate? Yes; Fluid consistency: Thin; Fluid restriction: 1500 mL Fluid    Family Communication  :  wife  Code Status :  DNR  Disposition Plan  : Likely to be discharge in the morning  Consults  :  Cards  Procedures  :    DVT Prophylaxis  :  Coumadin  Lab Results  Component Value Date   INR 3.70 03/17/2016   INR 2.75 03/16/2016   INR 2.58 03/15/2016    Lab Results  Component Value Date   PLT 130 (L) 03/15/2016    Inpatient Medications  Scheduled Meds: . allopurinol  300 mg Oral Daily  . amiodarone  400 mg Oral Daily  . aspirin EC  81 mg Oral Daily  . donepezil  23 mg Oral BID  . levofloxacin  750 mg Oral Daily  . LORazepam  0.5 mg Oral QHS  . memantine  10 mg Oral BID  . rosuvastatin  5 mg Oral q1800  . sodium chloride flush  3 mL Intravenous Q12H  . spironolactone  12.5 mg Oral Daily  . thiamine  100 mg Oral BID  . traZODone  400 mg Oral QHS  . Warfarin - Pharmacist Dosing Inpatient   Does not apply q1800   Continuous Infusions: . DOBUTamine     PRN Meds:.sodium chloride, acetaminophen, diclofenac sodium, fluticasone, ondansetron (ZOFRAN) IV, sodium chloride flush  Antibiotics  :    Anti-infectives    Start     Dose/Rate Route  Frequency Ordered Stop   03/16/16 1130  levofloxacin (LEVAQUIN) tablet 750 mg     750 mg Oral Daily 03/16/16 1112           Objective:   Vitals:   03/17/16 0024 03/17/16 0124 03/17/16 0350 03/17/16 0712  BP: 107/74 105/77  99/70  Pulse: 61 61 (!) 58 82  Resp: 20 19 20 19   Temp: 98 F (36.7 C)  98 F (36.7 C)   TempSrc: Oral  Oral   SpO2: 94% 90% 95%   Weight:   76.7 kg (169 lb)   Height:        Wt Readings from Last 3 Encounters:  03/17/16 76.7 kg (169 lb)  03/05/16 77.1 kg (170 lb)  12/18/15 78.7 kg (173 lb 8 oz)     Intake/Output Summary (Last 24 hours) at 03/17/16 1017 Last data filed at 03/17/16 0400  Gross per 24 hour  Intake           519.43 ml  Output              550 ml  Net           -30.57 ml     Physical Exam  Awake Alert, Oriented X 3, No new F.N deficits, Normal affect [place].AT,PERRAL Supple Neck,No JVD, No cervical lymphadenopathy appriciated.  Symmetrical Chest wall movement, Good air movement bilaterally, CTAB RRR,No Gallops,Rubs or new Murmurs, No Parasternal Heave +ve B.Sounds, Abd Soft, No tenderness, No organomegaly appriciated, No rebound - guarding or rigidity. No Cyanosis, Clubbing or edema, No new Rash or bruise     Data Review:    CBC  Recent Labs Lab 03/15/16 2136  WBC 9.3  HGB 13.2  HCT 41.5  PLT 130*  MCV 96.7  MCH 30.8  MCHC 31.8  RDW 17.4*  LYMPHSABS 1.6  MONOABS 0.9  EOSABS 0.0  BASOSABS 0.0    Chemistries   Recent Labs Lab 03/15/16 2136 03/16/16 0324 03/17/16 0535  NA 140 141 137  K 3.9 3.3* 4.1  CL 98* 104 100*  CO2 30 30 31   GLUCOSE 118* 131* 91  BUN 30* 26* 24*  CREATININE 1.06 0.92 0.93  CALCIUM 8.6* 8.0* 8.0*  MG  --   --  2.0  AST 20  --   --   ALT 15*  --   --   ALKPHOS 93  --   --   BILITOT 1.3*  --   --    ------------------------------------------------------------------------------------------------------------------ No results for input(s): CHOL, HDL, LDLCALC, TRIG, CHOLHDL,  LDLDIRECT in the last 72 hours.  Lab Results  Component Value Date   HGBA1C  03/29/2007    5.4 (NOTE)   The ADA recommends the following therapeutic goals for glycemic   control related to Hgb A1C measurement:   Goal of Therapy:   < 7.0% Hgb A1C   Action Suggested:  > 8.0% Hgb A1C   Ref:  Diabetes Care, 22, Suppl. 1, 1999   ------------------------------------------------------------------------------------------------------------------ No results for input(s): TSH, T4TOTAL, T3FREE, THYROIDAB in the last 72 hours.  Invalid input(s): FREET3 ------------------------------------------------------------------------------------------------------------------ No results for input(s): VITAMINB12, FOLATE, FERRITIN, TIBC, IRON, RETICCTPCT in the last 72 hours.  Coagulation profile  Recent Labs Lab 03/12/16 1409 03/15/16 2136 03/16/16 0324 03/17/16 0535  INR 2.7 2.58 2.75 3.70    No results for input(s): DDIMER in the last 72 hours.  Cardiac Enzymes  Recent Labs Lab 03/16/16 0324 03/16/16 0852 03/16/16 1409  TROPONINI 0.06* 0.07* 0.06*   ------------------------------------------------------------------------------------------------------------------    Component Value Date/Time   BNP >4,500.0 (H) 03/15/2016 2152    Micro Results Recent Results (from the past 240 hour(s))  Culture, blood (Routine x 2)     Status: None (Preliminary result)   Collection Time: 03/15/16  9:36 PM  Result Value Ref Range Status   Specimen Description BLOOD RIGHT ANTECUBITAL  Final   Special Requests BOTTLES DRAWN AEROBIC AND ANAEROBIC 5CC EA  Final   Culture NO GROWTH < 24 HOURS  Final   Report Status PENDING  Incomplete  Culture, blood (Routine x 2)     Status: None (Preliminary result)   Collection Time: 03/15/16 10:20 PM  Result Value Ref Range Status   Specimen Description BLOOD LEFT ANTECUBITAL  Final   Special Requests BOTTLES DRAWN AEROBIC AND ANAEROBIC 5CC EA  Final   Culture NO GROWTH  < 24 HOURS  Final   Report Status PENDING  Incomplete    Radiology Reports Dg Chest 2 View  Result Date: 03/15/2016 CLINICAL DATA:  Cough and rhonchi EXAM: CHEST  2 VIEW COMPARISON:  05/09/2015 FINDINGS: Moderate cardiomegaly.  The transvenous cardiac leads appear intact. Moderate vascular and interstitial prominence. Hazy central lung opacities bilaterally. Small right pleural effusion. IMPRESSION: The findings likely represent congestive heart failure with interstitial and alveolar edema, as well a small right pleural effusion. Electronically Signed   By: Andreas Newport M.D.   On: 03/15/2016 22:21    Time Spent in minutes  30   SINGH,PRASHANT K M.D on 03/17/2016 at 10:17 AM  Between 7am to 7pm - Pager - [phone]  After 7pm go to [email or web address] - password [email or web address]  [facility] -  Office  [phone]

## 2016-03-17 NOTE — Progress Notes (Addendum)
Advanced Heart Failure Rounding Note  PCP: Dr. Lysle Rubens Primary Cardiologist: Dr. Haroldine Laws   Subjective:    81 yo male with a history of chronic systolic HF EF 98% due to NICM s/p CRT-D ([employer]), mechanical AVR (St Jude), chronic coumadin therapy, LBBB, PAF,  ETOH abuse, dementia and h/o non-compliance.  Presented to ED 03/15/16 with hypoxia in the setting of recent URI and found to be in acute on chronic systolic CHF  Started on milrinone yesterday.  Pressures soft so switched to dobutamine.   They do not want inotrope at home, so no PICC line placed.   Denies SOB. Continues with wet, hacking cough.On dobutamine 2.5.  Remains weak and very fatigued.   Pressures remain slightly soft but improved this am.  Pt only alert to self.   ICD interrogation reviewed.  2.5% AT/AF burden. No VT/VF noted, but VT/VF detection off.  BiV pacing 92% of the time.  Fluid index below threshold.   Objective:   Weight Range: 169 lb (76.7 kg) Body mass index is 24.96 kg/m.   Vital Signs:   Temp:  [98 F (36.7 C)-98.4 F (36.9 C)] 98 F (36.7 C) (03/12 0350) Pulse Rate:  [33-82] 82 (03/12 0712) Resp:  [15-22] 19 (03/12 0712) BP: (80-122)/(50-86) 99/70 (03/12 0712) SpO2:  [90 %-97 %] 95 % (03/12 0350) Weight:  [169 lb (76.7 kg)] 169 lb (76.7 kg) (03/12 0350)    Weight change: Filed Weights   03/15/16 2129 03/16/16 0014 03/17/16 0350  Weight: 165 lb (74.8 kg) 168 lb 4.8 oz (76.3 kg) 169 lb (76.7 kg)    Intake/Output:   Intake/Output Summary (Last 24 hours) at 03/17/16 1144 Last data filed at 03/17/16 0400  Gross per 24 hour  Intake           519.43 ml  Output              550 ml  Net           -30.57 ml     Physical Exam: General:  Chronically ill and elderly appearing. NAD at rest.  HEENT: normal Neck: supple. JVP difficult.  Appears possibly 8-9 cm. Carotids +2 bilat; no bruits. No thyromegaly or nodule noted.  Cor: Irregular. Rate controlled. Occasional ectopy. + s3. Mechanical  S2 Lungs: Rhonchi throughout. Clears with cough.  Abdomen: soft, NT, ND, no HSM. No bruits or masses. +BS  Extremities: no cyanosis, clubbing, rash. 1+ Bilateral edema.  Neuro: alert & oriented to person, cranial nerves grossly intact. moves all 4 extremities w/o difficulty. Affect flat.   Telemetry: Personally reviewed, V pacing, Having runs of SVT including one 20+ beat run of NSVT  Labs: CBC  Recent Labs  03/15/16 2136  WBC 9.3  NEUTROABS 6.8  HGB 13.2  HCT 41.5  MCV 96.7  PLT 921*   Basic Metabolic Panel  Recent Labs  03/16/16 0324 03/17/16 0535  NA 141 137  K 3.3* 4.1  CL 104 100*  CO2 30 31  GLUCOSE 131* 91  BUN 26* 24*  CREATININE 0.92 0.93  CALCIUM 8.0* 8.0*  MG  --  2.0   Liver Function Tests  Recent Labs  03/15/16 2136  AST 20  ALT 15*  ALKPHOS 93  BILITOT 1.3*  PROT 6.3*  ALBUMIN 3.5   No results for input(s): LIPASE, AMYLASE in the last 72 hours. Cardiac Enzymes  Recent Labs  03/16/16 0324 03/16/16 0852 03/16/16 1409  TROPONINI 0.06* 0.07* 0.06*    BNP: BNP (last  3 results)  Recent Labs  03/15/16 2152  BNP >4,500.0*    ProBNP (last 3 results) No results for input(s): PROBNP in the last 8760 hours.   D-Dimer No results for input(s): DDIMER in the last 72 hours. Hemoglobin A1C No results for input(s): HGBA1C in the last 72 hours. Fasting Lipid Panel No results for input(s): CHOL, HDL, LDLCALC, TRIG, CHOLHDL, LDLDIRECT in the last 72 hours. Thyroid Function Tests No results for input(s): TSH, T4TOTAL, T3FREE, THYROIDAB in the last 72 hours.  Invalid input(s): FREET3  Other results:     Imaging/Studies:   No results found.    Medications:     Scheduled Medications: . allopurinol  300 mg Oral Daily  . amiodarone  400 mg Oral Daily  . aspirin EC  81 mg Oral Daily  . donepezil  23 mg Oral BID  . levofloxacin  750 mg Oral Daily  . LORazepam  0.5 mg Oral QHS  . memantine  10 mg Oral BID  . rosuvastatin  5 mg  Oral q1800  . sodium chloride flush  3 mL Intravenous Q12H  . spironolactone  12.5 mg Oral Daily  . thiamine  100 mg Oral BID  . traZODone  400 mg Oral QHS  . Warfarin - Pharmacist Dosing Inpatient   Does not apply q1800     Infusions: . DOBUTamine       PRN Medications:  sodium chloride, acetaminophen, diclofenac sodium, fluticasone, ondansetron (ZOFRAN) IV, sodium chloride flush   Assessment/Plan   1. Acute on chronic systolic HF with probable low output   NICM EF 20% 2. PAF 3. Acute respiratory failure with possible RLL infiltrate 4. Mechanical AVR 5. Hupokalemia 6. DNR 7. NSVT  By ICD interrogation has been in and out of afib, though longest period 0.6 hrs.  Total burden 2.5%.   Afebrile. Pt on levaquin for ? CAP.   Continue dobutamine 2.5 mg for now. Creatinine stable. Urine output OK.   Having NSVT. Electrolytes stable and asymptomatic. Continue to follow.   Pt very deconditioned and has trouble even performing simple tasks such as hygiene for himself.   MD to meet with family to discuss prognosis and plan today. After, will re-engage palliative care team for goals of care. Suspect prognosis is very poor.   Length of Stay: 1  Annamaria Helling  03/17/2016, 11:44 AM  Advanced Heart Failure Team Pager [phone] (M-F; 7a - 4p)  Please contact [facility] for night-coverage after hours (4p -7a ) and weekends on [email or web address]  Patient seen and examined with Oda Kilts, PA-C. We discussed all aspects of the encounter. I agree with the assessment and plan as stated above.   ICD interrogated personally and discussed with ICD rep. Optivol ok. Activity level < 1 hr for past 6 months. AF only 2.5 %. ECHO images reviewed personally EF 20-25%. RV moderately HK.   Suspect current decompensation due in part to URI and intermittent AF but he also has had persistent downhill course over past 6 months.  He feels slightly better on dobutamine 2.5 but overall  remains very weak and confused. Does not appear overly volume overloaded on exam or by Optivol despite markedly elevated BNP. I discussed options with him and his family. He is currently DNR and is uninterested in home inotropes. Family very concerned about their ability to care for him as he gets more ill. They are agreeable to meeting with Hospice to see what resources are available to help care for him.  Will consult.    Hold lasix for now. Will continue dobutamine for several days and give him time to recover from URI.   Total time spent 40 minutes. Over half that time spent discussing above.   Cinda Hara,MD 7:22 PM

## 2016-03-17 NOTE — Progress Notes (Signed)
ANTICOAGULATION CONSULT NOTE - Follow Up Consult  Pharmacy Consult for Coumadin Indication: atrial fibrillation  No Known Allergies  Patient Measurements: Height: 5\' 9"  (175.3 cm) Weight: 169 lb (76.7 kg) IBW/kg (Calculated) : 70.7  Vital Signs: Temp: 98 F (36.7 C) (03/12 0350) Temp Source: Oral (03/12 0350) BP: 111/79 (03/12 1132) Pulse Rate: 85 (03/12 1132)  Labs:  Recent Labs  03/15/16 2136  03/16/16 0324 03/16/16 0852 03/16/16 1409 03/17/16 0535  HGB 13.2  --   --   --   --   --   HCT 41.5  --   --   --   --   --   PLT 130*  --   --   --   --   --   LABPROT 28.2*  --  29.6*  --   --  37.6*  INR 2.58  --  2.75  --   --  3.70  CREATININE 1.06  --  0.92  --   --  0.93  TROPONINI  --   < > 0.06* 0.07* 0.06*  --   < > = values in this interval not displayed.  Estimated Creatinine Clearance: 65.5 mL/min (by C-G formula based on SCr of 0.93 mg/dL).   Medical History: Past Medical History:  Diagnosis Date  . Atrial fibrillation ([place])   . Cardiomyopathy   . Chronic systolic heart failure ([place])   . Dementia   . Left bundle-branch block   . Pneumonia 08/2014    Assessment: 81yo male presents w/ increased confusion (worsened from Alzhemier's baseline), admitted for further w/u, to continue Coumadin for Afib; Admit INR 2.5 at goal with last dose taken 3/10 at home. CBC is within normal limits.  No signs of bleeding noted. DDI with Levaquin- Could increase INR.  INR 3/12 3.7 - no bleeding noted  PTA dose: 2 mg STRS                   3 mg MWF per coumadin clinic  Goal of Therapy:  INR 2-3   Plan:  Hold warfarin today Daily INR Monitor for signs/symptoms of bleeding.  Bonnita Nasuti Pharm.D. CPP, BCPS Clinical Pharmacist [phone] 03/17/2016 11:58 AM

## 2016-03-17 NOTE — Plan of Care (Signed)
Problem: Safety: Goal: Ability to remain free from injury will improve Outcome: Progressing Patient w/ history of dementia; patient's bed is in lowest position and call bell is within reach. Patient's son is with him at the bedside.

## 2016-03-17 NOTE — Plan of Care (Signed)
Problem: Cardiac: Goal: Ability to achieve and maintain adequate cardiopulmonary perfusion will improve Outcome: Progressing Patient is currently receiving IV dobutamine; strict intake/output and daily weights documented; patient is on a 1500 cc fluid restriction.

## 2016-03-17 NOTE — Progress Notes (Signed)
  Echocardiogram 2D Echocardiogram has been performed.  Jon Butler 03/17/2016, 4:57 PM

## 2016-03-18 ENCOUNTER — Telehealth: Payer: Self-pay | Admitting: Cardiology

## 2016-03-18 ENCOUNTER — Encounter: Payer: Medicare Other | Admitting: *Deleted

## 2016-03-18 DIAGNOSIS — J181 Lobar pneumonia, unspecified organism: Secondary | ICD-10-CM

## 2016-03-18 LAB — BASIC METABOLIC PANEL
Anion gap: 7 (ref 5–15)
BUN: 17 mg/dL (ref 6–20)
CO2: 30 mmol/L (ref 22–32)
CREATININE: 0.77 mg/dL (ref 0.61–1.24)
Calcium: 8.4 mg/dL — ABNORMAL LOW (ref 8.9–10.3)
Chloride: 100 mmol/L — ABNORMAL LOW (ref 101–111)
GFR calc non Af Amer: >60 mL/min (ref >60)
Glucose, Bld: 75 mg/dL (ref 65–99)
Potassium: 3.9 mmol/L (ref 3.5–5.1)
Sodium: 137 mmol/L (ref 135–145)

## 2016-03-18 LAB — PROTIME-INR
INR: 3.25
PROTHROMBIN TIME: 33.9 s — AB (ref 11.4–15.2)

## 2016-03-18 LAB — MAGNESIUM: Magnesium: 2 mg/dL (ref 1.7–2.4)

## 2016-03-18 MED ORDER — SENNOSIDES-DOCUSATE SODIUM 8.6-50 MG PO TABS
2.0000 | ORAL_TABLET | Freq: Every day | ORAL | Status: DC
  Administered 2016-03-18 – 2016-03-20 (×3): 2 via ORAL
  Filled 2016-03-18 (×3): qty 2

## 2016-03-18 MED ORDER — FUROSEMIDE 10 MG/ML IJ SOLN
40.0000 mg | Freq: Once | INTRAMUSCULAR | Status: AC
  Administered 2016-03-18: 40 mg via INTRAVENOUS
  Filled 2016-03-18: qty 4

## 2016-03-18 MED ORDER — DONEPEZIL HCL 23 MG PO TABS
23.0000 mg | ORAL_TABLET | Freq: Every day | ORAL | Status: DC
  Administered 2016-03-19 – 2016-03-20 (×2): 23 mg via ORAL
  Filled 2016-03-18 (×2): qty 1

## 2016-03-18 NOTE — Progress Notes (Signed)
PROGRESS NOTE                                                                                                                                                                                                             Patient Demographics:    Jon Butler, is a 81 y.o. male, DOB - [birth year], JME:[id number]  Admit date - 03/15/2016   Admitting Physician Etta Quill, DO  Outpatient Primary MD for the patient is [name], MD  LOS - 2  Chief Complaint  Patient presents with  . Cough  . Altered Mental Status  . Leg Swelling       Brief Narrative   Jon Butler is a 81 y.o. male with medical history significant of CHF with EF 25-30%, AICD / biventricular pacer, A.Fib, NICM, dementia.  Patient presents to the ED with family with AMS onset today.  He has had SOB, cough productive of whiteish sputum, leg swelling (R>L but he always has this) worsening over the past 2 weeks.  He was admitted for acute on chronic hypoxic respiratory failure due to CHF and bronchitis.  He has been seen by cardiology currently on dobutamine drip along with diuretics. Plan is to discharge home in 1-2 days once he is been adequately diuresed.   Subjective:    Jon Butler today has, No headache, No chest pain, No abdominal pain - No Nausea, No new weakness tingling or numbness, +ve Cough & SOB.    Assessment  & Plan :    1.Acute on chronic hypoxic respiratory failure due to acute on chronic combined systolic and dCHF last EF around 25% with acute bronchitis.  Cardiology following, he is on IV Lasix along with beta blocker and Aldactone, Currently on dobutamine drip per cardiology, low blood pressure prohibits use of ACE/ARB, continue gentle diuresis and monitor closely. Continue with salt and fluid restriction, daily weights, intake and output monitoring.    Filed Weights   03/16/16 0014 03/17/16 0350 03/18/16 0608  Weight: 76.3 kg  (168 lb 4.8 oz) 76.7 kg (169 lb) 77.2 kg (170 lb 1.6 oz)    2. Nonischemic cardiomyopathy with EF around 25%. Treatment hasn't #1 above. He has AICD with biventricular pacemaker as well.  3. Paroxysmal atrial fibrillation, mechanical AVR [employer]. [place] vasc 2 score of at least 4. On amiodarone along with  Coreg, pharmacy monitoring Coumadin and INR. Goal INR 2.5-3.5. Currently at goal.  4. Acute bronchitis. Check sputum Gram stain and culture, placed on oral Levaquin with a stop date of 03/20/2016. Added flutter valve for pulmonary toiletry. Encourage sitting up in chair and increase activity likely discharge in the morning.  5. Mild underlying dementia. Remains at risk for delirium, minimize narcotics and benzodiazepines. Continue home medications along with supportive care.  6. Gout. On allopurinol continue.  7. Dyslipidemia. Continue statin.   Diet : Diet Heart Room service appropriate? Yes; Fluid consistency: Thin; Fluid restriction: 1500 mL Fluid    Family Communication  :  wife  Code Status :  DNR  Disposition Plan  : Likely to be discharged in 1-2 days  Consults  :  Cards  Procedures  :    DVT Prophylaxis  :  Coumadin  Lab Results  Component Value Date   INR 3.25 03/18/2016   INR 3.70 03/17/2016   INR 2.75 03/16/2016    Lab Results  Component Value Date   PLT 130 (L) 03/15/2016    Inpatient Medications  Scheduled Meds: . allopurinol  300 mg Oral Daily  . amiodarone  400 mg Oral Daily  . aspirin EC  81 mg Oral Daily  . donepezil  23 mg Oral BID  . furosemide  40 mg Intravenous Once  . levofloxacin  750 mg Oral Daily  . LORazepam  0.5 mg Oral QHS  . memantine  10 mg Oral BID  . rosuvastatin  5 mg Oral q1800  . sodium chloride flush  3 mL Intravenous Q12H  . spironolactone  12.5 mg Oral Daily  . thiamine  100 mg Oral BID  . traZODone  400 mg Oral QHS  . Warfarin - Pharmacist Dosing Inpatient   Does not apply q1800   Continuous Infusions: . DOBUTamine  2.5 mcg/kg/min (03/17/16 2000)   PRN Meds:.sodium chloride, acetaminophen, diclofenac sodium, fluticasone, ondansetron (ZOFRAN) IV, sodium chloride flush  Antibiotics  :    Anti-infectives    Start     Dose/Rate Route Frequency Ordered Stop   03/16/16 1130  levofloxacin (LEVAQUIN) tablet 750 mg     750 mg Oral Daily 03/16/16 1112           Objective:   Vitals:   03/17/16 2354 03/18/16 0608 03/18/16 0753 03/18/16 0900  BP: 120/76 103/77 114/86   Pulse: 60  (!) 41   Resp: 15  17   Temp: 97.3 F (36.3 C) 98 F (36.7 C)  97.6 F (36.4 C)  TempSrc: Axillary   Axillary  SpO2: 96%  95%   Weight:  77.2 kg (170 lb 1.6 oz)    Height:        Wt Readings from Last 3 Encounters:  03/18/16 77.2 kg (170 lb 1.6 oz)  03/05/16 77.1 kg (170 lb)  12/18/15 78.7 kg (173 lb 8 oz)     Intake/Output Summary (Last 24 hours) at 03/18/16 1044 Last data filed at 03/18/16 0600  Gross per 24 hour  Intake              580 ml  Output             1950 ml  Net            -1370 ml     Physical Exam  Awake Alert, Oriented X 3, No new F.N deficits, Normal affect [place].AT,PERRAL Supple Neck,No JVD, No cervical lymphadenopathy appriciated.  Symmetrical Chest wall movement, Good air  movement bilaterally, coarse B sounds RRR,No Gallops,Rubs or new Murmurs, No Parasternal Heave +ve B.Sounds, Abd Soft, No tenderness, No organomegaly appriciated, No rebound - guarding or rigidity. No Cyanosis, Clubbing , trace edema, No new Rash or bruise     Data Review:    CBC  Recent Labs Lab 03/15/16 2136  WBC 9.3  HGB 13.2  HCT 41.5  PLT 130*  MCV 96.7  MCH 30.8  MCHC 31.8  RDW 17.4*  LYMPHSABS 1.6  MONOABS 0.9  EOSABS 0.0  BASOSABS 0.0    Chemistries   Recent Labs Lab 03/15/16 2136 03/16/16 0324 03/17/16 0535 03/18/16 0250  NA 140 141 137 137  K 3.9 3.3* 4.1 3.9  CL 98* 104 100* 100*  CO2 30 30 31 30   GLUCOSE 118* 131* 91 75  BUN 30* 26* 24* 17  CREATININE 1.06 0.92 0.93 0.77    CALCIUM 8.6* 8.0* 8.0* 8.4*  MG  --   --  2.0 2.0  AST 20  --   --   --   ALT 15*  --   --   --   ALKPHOS 93  --   --   --   BILITOT 1.3*  --   --   --    ------------------------------------------------------------------------------------------------------------------ No results for input(s): CHOL, HDL, LDLCALC, TRIG, CHOLHDL, LDLDIRECT in the last 72 hours.  Lab Results  Component Value Date   HGBA1C  03/29/2007    5.4 (NOTE)   The ADA recommends the following therapeutic goals for glycemic   control related to Hgb A1C measurement:   Goal of Therapy:   < 7.0% Hgb A1C   Action Suggested:  > 8.0% Hgb A1C   Ref:  Diabetes Care, 22, Suppl. 1, 1999   ------------------------------------------------------------------------------------------------------------------ No results for input(s): TSH, T4TOTAL, T3FREE, THYROIDAB in the last 72 hours.  Invalid input(s): FREET3 ------------------------------------------------------------------------------------------------------------------ No results for input(s): VITAMINB12, FOLATE, FERRITIN, TIBC, IRON, RETICCTPCT in the last 72 hours.  Coagulation profile  Recent Labs Lab 03/12/16 1409 03/15/16 2136 03/16/16 0324 03/17/16 0535 03/18/16 0648  INR 2.7 2.58 2.75 3.70 3.25    No results for input(s): DDIMER in the last 72 hours.  Cardiac Enzymes  Recent Labs Lab 03/16/16 0324 03/16/16 0852 03/16/16 1409  TROPONINI 0.06* 0.07* 0.06*   ------------------------------------------------------------------------------------------------------------------    Component Value Date/Time   BNP >4,500.0 (H) 03/15/2016 2152    Micro Results Recent Results (from the past 240 hour(s))  Culture, blood (Routine x 2)     Status: None (Preliminary result)   Collection Time: 03/15/16  9:36 PM  Result Value Ref Range Status   Specimen Description BLOOD RIGHT ANTECUBITAL  Final   Special Requests BOTTLES DRAWN AEROBIC AND ANAEROBIC 5CC EA  Final    Culture NO GROWTH 2 DAYS  Final   Report Status PENDING  Incomplete  Culture, blood (Routine x 2)     Status: None (Preliminary result)   Collection Time: 03/15/16 10:20 PM  Result Value Ref Range Status   Specimen Description BLOOD LEFT ANTECUBITAL  Final   Special Requests BOTTLES DRAWN AEROBIC AND ANAEROBIC 5CC EA  Final   Culture NO GROWTH 2 DAYS  Final   Report Status PENDING  Incomplete    Radiology Reports Dg Chest 2 View  Result Date: 03/15/2016 CLINICAL DATA:  Cough and rhonchi EXAM: CHEST  2 VIEW COMPARISON:  05/09/2015 FINDINGS: Moderate cardiomegaly.  The transvenous cardiac leads appear intact. Moderate vascular and interstitial prominence. Hazy central lung opacities bilaterally. Small right  pleural effusion. IMPRESSION: The findings likely represent congestive heart failure with interstitial and alveolar edema, as well a small right pleural effusion. Electronically Signed   By: Andreas Newport M.D.   On: 03/15/2016 22:21    Time Spent in minutes  30   SINGH,PRASHANT K M.D on 03/18/2016 at 10:44 AM  Between 7am to 7pm - Pager - [phone]  After 7pm go to [email or web address] - password [facility]  [facility] -  Office  [phone]

## 2016-03-18 NOTE — Plan of Care (Signed)
Problem: Activity: Goal: Risk for activity intolerance will decrease Outcome: Progressing Pt encouraged to get OOB QS to chair.

## 2016-03-18 NOTE — Clinical Social Work Note (Signed)
Clinical Social Worker met with patient and patient son at bedside to offer support and discuss patient discharge needs.  Patient and patient son both state that patient will return home with patient wife at discharge.  Patient son would like to speak with RNCM regarding home health vs. Potential home hospice.  CSW notified RNCM of patient family wishes.  Clinical Social Worker will sign off for now as social work intervention is no longer needed. Please consult [place] again if new need arises.  Barbette Or, [place]

## 2016-03-18 NOTE — Progress Notes (Signed)
Advanced Heart Failure Rounding Note  PCP: Dr. Lysle Rubens Primary Cardiologist: Dr. Haroldine Laws   Subjective:    81 yo male with a history of chronic systolic HF EF 16% due to NICM s/p CRT-D ([employer]), mechanical AVR (St Jude), chronic coumadin therapy, LBBB, PAF,  ETOH abuse, dementia and h/o non-compliance.  Presented to ED 03/15/16 with hypoxia in the setting of recent URI and found to be in acute on chronic systolic CHF  Started on milrinone on 03/16/16. Pressures soft so switched to dobutamine. Family does not want inotropes at home, so no PICC line placed.   Denies SOB, but remains fatigued. Feels a bit more alert this am. Still with wet cough. On dobutamine 2.5.   BP improved this am, no longer soft.   ICD interrogation reviewed.  2.5% AT/AF burden. No VT/VF noted, but VT/VF detection off.  BiV pacing 92% of the time.  Fluid index below threshold.   Objective:   Weight Range: 170 lb 1.6 oz (77.2 kg) Body mass index is 25.12 kg/m.   Vital Signs:   Temp:  [97.3 F (36.3 C)-98 F (36.7 C)] 97.6 F (36.4 C) (03/13 0900) Pulse Rate:  [30-85] 41 (03/13 0753) Resp:  [15-21] 17 (03/13 0753) BP: (96-120)/(71-86) 114/86 (03/13 0753) SpO2:  [90 %-97 %] 95 % (03/13 0753) Weight:  [170 lb 1.6 oz (77.2 kg)] 170 lb 1.6 oz (77.2 kg) (03/13 0608) Last BM Date: 03/15/16  Weight change: Filed Weights   03/16/16 0014 03/17/16 0350 03/18/16 1096  Weight: 168 lb 4.8 oz (76.3 kg) 169 lb (76.7 kg) 170 lb 1.6 oz (77.2 kg)    Intake/Output:   Intake/Output Summary (Last 24 hours) at 03/18/16 0454 Last data filed at 03/18/16 0600  Gross per 24 hour  Intake              580 ml  Output             1950 ml  Net            -1370 ml     Physical Exam: General: Ill appearing male, fatigued. + wet cough HEENT: normal Neck: supple. JVP elevated at 10-11cm. Carotids +2 bilat; no bruits. No thyromegaly or nodule noted.  Cor: Irregular rhythm. Rate controlled. + s3. Mechanical S2 Lungs:  Diffuse rhonchi throughout, strong wet cough.  Abdomen: soft, NT, ND, no HSM. No bruits or masses. +BS  Extremities: Warm. no cyanosis, clubbing, rash. 2+ Bilateral pretibial edema.  Neuro: alert & oriented to person, cranial nerves grossly intact. moves all 4 extremities w/o difficulty. Affect flat.   Telemetry: Personally reviewed, BiV pacing, frequent PVC's    Labs: CBC  Recent Labs  03/15/16 2136  WBC 9.3  NEUTROABS 6.8  HGB 13.2  HCT 41.5  MCV 96.7  PLT 098*   Basic Metabolic Panel  Recent Labs  03/17/16 0535 03/18/16 0250  NA 137 137  K 4.1 3.9  CL 100* 100*  CO2 31 30  GLUCOSE 91 75  BUN 24* 17  CREATININE 0.93 0.77  CALCIUM 8.0* 8.4*  MG 2.0 2.0   Liver Function Tests  Recent Labs  03/15/16 2136  AST 20  ALT 15*  ALKPHOS 93  BILITOT 1.3*  PROT 6.3*  ALBUMIN 3.5   Cardiac Enzymes  Recent Labs  03/16/16 0324 03/16/16 0852 03/16/16 1409  TROPONINI 0.06* 0.07* 0.06*    BNP: BNP (last 3 results)  Recent Labs  03/15/16 2152  BNP >4,500.0*    Imaging/Studies:  Transthoracic Echocardiography 03/17/16 Study Conclusions  - Left ventricle: The cavity size was severely dilated. Wall   thickness was normal. Systolic function was severely reduced. The   estimated ejection fraction was in the range of 20% to 25%.   Diffuse hypokinesis. Doppler parameters are consistent with a   reversible restrictive pattern, indicative of decreased left   ventricular diastolic compliance and/or increased left atrial   pressure (grade 3 diastolic dysfunction). - Aortic valve: A mechanical prosthesis was present and functioning   normally. Mean gradient (S): 7 mm Hg. Valve area (VTI): 2.46   cm^2. Valve area (Vmax): 2.33 cm^2. Valve area (Vmean): 2.27   cm^2. - Mitral valve: There was moderate to severe regurgitation. - Left atrium: The atrium was severely dilated. - Right ventricle: The cavity size was mildly dilated. Wall   thickness was normal. Pacer  wire or catheter noted in right   ventricle.  Impressions:  - When compared to prior, mitral regurgitation has increased.    Medications:     Scheduled Medications: . allopurinol  300 mg Oral Daily  . amiodarone  400 mg Oral Daily  . aspirin EC  81 mg Oral Daily  . donepezil  23 mg Oral BID  . furosemide  40 mg Intravenous Once  . levofloxacin  750 mg Oral Daily  . LORazepam  0.5 mg Oral QHS  . memantine  10 mg Oral BID  . rosuvastatin  5 mg Oral q1800  . sodium chloride flush  3 mL Intravenous Q12H  . spironolactone  12.5 mg Oral Daily  . thiamine  100 mg Oral BID  . traZODone  400 mg Oral QHS  . Warfarin - Pharmacist Dosing Inpatient   Does not apply q1800    Infusions: . DOBUTamine 2.5 mcg/kg/min (03/17/16 2000)    PRN Medications: sodium chloride, acetaminophen, diclofenac sodium, fluticasone, ondansetron (ZOFRAN) IV, sodium chloride flush   Assessment/Plan   1. Acute on chronic systolic HF with probable low output   NICM EF 20% 2. PAF 3. Acute respiratory failure with possible RLL infiltrate 4. Mechanical AVR 5. Hupokalemia 6. DNR 7. NSVT  By ICD interrogation has been in and out of afib, though longest period 0.6 hrs. Total burden 2.5%.   Afebrile. Pt on levaquin for ? CAP.   Continue dobutamine 2.5 mg today. Add 40mg  IV Lasix for one dose today, has 2+ pretibial edema, + JVP. Will place TED hose.   Having frequent PVC's, but no NSVT. ICD has been turned off. Mg 2.0.   Will get Palliative consult today, as well as PT and social work.   Length of Stay: [place], NP  03/18/2016, 9:37 AM  Advanced Heart Failure Team Pager [phone] (M-F; 7a - 4p)  Please contact [facility] for night-coverage after hours (4p -7a ) and weekends on [email or web address]  Patient seen and examined with Jettie Booze, NP. We discussed all aspects of the encounter. I agree with the assessment and plan as stated above.   Mildly improved on dobutamine. Volume status up a  bit. Will give one dose IV lasix today.   Continue Levaquin for PNA.   Amio increased as AF had become more prevalent with URI.   I reviewed echo personally and EF 20-25%.   I discussed options with him and his family yesterday and this am. He is currently DNR and is uninterested in home inotropes. Family very concerned about their ability to care for him as he gets more ill. They are  agreeable to meeting with Hospice to see what resources are available to help care for him. Will consult Palliative Care. Will also ask PT to see. Ideally would like to see him go home with Hospice and possible some Nurse aid support for help with bathing 2-3x/week.   D/W Dr. Candiss Norse.   Alveria Mcglaughlin,MD 9:56 AM

## 2016-03-18 NOTE — Telephone Encounter (Signed)
Confirmed remote transmission w/ pt wife.   

## 2016-03-18 NOTE — Progress Notes (Signed)
ANTICOAGULATION CONSULT NOTE - Follow Up Consult  Pharmacy Consult for Coumadin Indication: atrial fibrillation  No Known Allergies  Patient Measurements: Height: 5\' 9"  (175.3 cm) Weight: 170 lb 1.6 oz (77.2 kg) IBW/kg (Calculated) : 70.7  Vital Signs: Temp: 97.6 F (36.4 C) (03/13 0900) Temp Source: Axillary (03/13 0900) BP: 114/77 (03/13 1054) Pulse Rate: 41 (03/13 0753)  Labs:  Recent Labs  03/15/16 2136  03/16/16 0324 03/16/16 0852 03/16/16 1409 03/17/16 0535 03/18/16 0250 03/18/16 0648  HGB 13.2  --   --   --   --   --   --   --   HCT 41.5  --   --   --   --   --   --   --   PLT 130*  --   --   --   --   --   --   --   LABPROT 28.2*  --  29.6*  --   --  37.6*  --  33.9*  INR 2.58  --  2.75  --   --  3.70  --  3.25  CREATININE 1.06  --  0.92  --   --  0.93 0.77  --   TROPONINI  --   < > 0.06* 0.07* 0.06*  --   --   --   < > = values in this interval not displayed.  Estimated Creatinine Clearance: 76.1 mL/min (by C-G formula based on SCr of 0.77 mg/dL).   Medical History: Past Medical History:  Diagnosis Date  . Atrial fibrillation ([employer])   . Cardiomyopathy   . Chronic systolic heart failure ([place])   . Dementia   . Left bundle-branch block   . Pneumonia 08/2014    Assessment: 81yo male presents w/ increased confusion (worsened from Alzhemier's baseline), admitted for further w/u, to continue Coumadin for Afib; Admit INR 2.5 at goal with last dose taken 3/10 at home. CBC is within normal limits.  No signs of bleeding noted. DDI with Levaquin- Could increase INR and amio dose increased 3/12.  INR 3.7>3.2 with hold - still elevated will hold again  PTA dose: 2 mg STRS                   3 mg MWF per coumadin clinic  Goal of Therapy:  INR 2-3   Plan:  Hold warfarin today Daily INR Monitor for signs/symptoms of bleeding.  Bonnita Nasuti Pharm.D. CPP, BCPS Clinical Pharmacist [phone] 03/18/2016 11:51 AM

## 2016-03-18 NOTE — Progress Notes (Signed)
Occupational Therapy Evaluation Patient Details Name: CAMMERON GREIS MRN: [id number] DOB: 1936-01-06 Today's Date: 03/18/2016    History of Present Illness 81 y.o. male with medical history significant of CHF with EF 25-30%, AICD / biventricular pacer, A.Fib, NICM, dementia.  Patient presents to the ED with family with AMS.  He has had SOB, cough productive of whiteish sputum, leg swelling (R>L but he always has this) worsening over the past 2 weeks.  He was admitted for acute on chronic hypoxic respiratory failure due to CHF and bronchitis.   Clinical Impression   PTA, family states pt was modified independent with mobility and self care @ RW level with occasional A as needed. Pt currently requires min A with mobility, mod A with ADL @ RW level and is a fall risk.Apparently plan is for pt to DC home. Pt will need 2/7 physical assistance with all mobility and ADL. Recommend follow up with [facility]. Will follow acutely to address established goals and facilitate safe DC home. VSS during session.   Follow Up Recommendations  Home health OT;Supervision/Assistance - 24 hour    Equipment Recommendations  None recommended by OT    Recommendations for Other Services       Precautions / Restrictions Precautions Precautions: Fall Precaution Comments: watch VS closely Restrictions Weight Bearing Restrictions: No      Mobility Bed Mobility Overal bed mobility: Needs Assistance Bed Mobility: Supine to Sit     Supine to sit: Mod assist     General bed mobility comments: OOB in chair  Transfers Overall transfer level: Needs assistance Equipment used: Rolling walker (2 wheeled) Transfers: Sit to/from Stand Sit to Stand: Mod assist from lower level         General transfer comment: vc to scoot forward in recliner before standing    Balance Overall balance assessment: Needs assistance Sitting-balance support: Feet supported;No upper extremity supported Sitting balance-Leahy Scale:  Fair Sitting balance - Comments:  Standing balance support: Bilateral upper extremity supported Standing balance-Leahy Scale: Poor Standing balance comment: walker and min A for static standing. Posterior bias; forward head/kyphotic posture                            ADL Overall ADL's : Needs assistance/impaired     Grooming: Set up;Sitting   Upper Body Bathing: Set up;Sitting   Lower Body Bathing: Moderate assistance;Sit to/from stand   Upper Body Dressing : Minimal assistance;Sitting   Lower Body Dressing: Moderate assistance;Sit to/from stand   Toilet Transfer: RW;Stand-pivot;Minimal assistance;BSC   Toileting- Water quality scientist and Hygiene: Moderate assistance Toileting - Clothing Manipulation Details (indicate cue type and reason): assist to clean periarea     Functional mobility during ADLs: Minimal assistance;Rolling walker;Cueing for safety       Vision         Perception     Praxis      Pertinent Vitals/Pain Pain Assessment: No/denies pain Faces Pain Scale: Hurts little more     Hand Dominance Right   Extremity/Trunk Assessment Upper Extremity Assessment Upper Extremity Assessment: Generalized weakness   Lower Extremity Assessment Lower Extremity Assessment: Defer to PT evaluation   Cervical / Trunk Assessment Cervical / Trunk Assessment: Kyphotic   Communication Communication Communication: HOH   Cognition Arousal/Alertness: Awake/alert Behavior During Therapy: Flat affect Overall Cognitive Status: History of cognitive impairments - at baseline Area of Impairment: Awareness;Problem solving;Memory;Safety/judgement;Attention;Orientation Orientation Level: Time Current Attention Level: Selective Memory: Decreased short-term memory;Decreased recall of precautions  Safety/Judgement: Decreased awareness of deficits;Decreased awareness of safety Awareness: Emergent Problem Solving: Slow processing;Difficulty sequencing;Requires  verbal cues;Requires tactile cues     General Comments       Exercises       Shoulder Instructions      Home Living Family/patient expects to be discharged to:: Private residence Living Arrangements: Spouse/significant other;Children Available Help at Discharge: Family;Available 24 hours/day Type of Home: House Home Access: Stairs to enter [employer] of Steps: 2 Entrance Stairs-Rails: Right Home Layout: One level     Bathroom Shower/Tub: Teacher, early years/pre: Standard Bathroom Accessibility: Yes   Home Equipment: Environmental consultant - 2 wheels;Bedside commode;Shower seat;Shower seat - built in   Additional Comments: Patient unreliable historian and no family available; info gleaned from previous admission      Prior Functioning/Environment Level of Independence: Independent                 OT Problem List: Decreased strength;Decreased activity tolerance;Impaired balance (sitting and/or standing);Decreased cognition;Decreased safety awareness;Decreased knowledge of use of DME or AE;Cardiopulmonary status limiting activity      OT Treatment/Interventions: Self-care/ADL training;Therapeutic exercise;DME and/or AE instruction;Therapeutic activities;Cognitive remediation/compensation;Patient/family education;Balance training    OT Goals(Current goals can be found in the care plan section) Acute Rehab OT Goals Patient Stated Goal: none stated OT Goal Formulation: With patient/family Time For Goal Achievement: 04/01/16 Potential to Achieve Goals: Good ADL Goals Pt Will Perform Grooming: (P) with min guard assist;standing Pt Will Perform Lower Body Bathing: (P) with min guard assist;with caregiver independent in assisting;sit to/from stand Pt Will Perform Lower Body Dressing: (P) with min assist;with caregiver independent in assisting;sit to/from stand  OT Frequency: Min 2X/week   Barriers to D/C:            Co-evaluation              End of  Session Equipment Utilized During Treatment: Gait belt;Rolling walker Nurse Communication: Mobility status  Activity Tolerance: Patient tolerated treatment well Patient left: in chair;with call bell/phone within reach;with family/visitor present  OT Visit Diagnosis: Unsteadiness on feet (R26.81);Muscle weakness (generalized) (M62.81)                ADL either performed or assessed with clinical judgement  Time: 0488-8916 OT Time Calculation (min): 20 min Charges:  OT General Charges $OT Visit: 1 Procedure OT Evaluation $OT Eval Moderate Complexity: 1 Procedure G-Codes:     Suhana Wilner, OT/L  [phone] 03/18/2016  Brytni Dray,HILLARY 03/18/2016, 4:44 PM

## 2016-03-18 NOTE — Progress Notes (Signed)
Physical Therapy Treatment Patient Details Name: Jon Butler MRN: [id number] DOB: 08-Jun-1935 Today's Date: 03/18/2016    History of Present Illness 81 y.o. male with medical history significant of CHF with EF 25-30%, AICD / biventricular pacer, A.Fib, NICM, dementia.  Patient presents to the ED with family with AMS onset today.  He has had SOB, cough productive of whiteish sputum, leg swelling (R>L but he always has this) worsening over the past 2 weeks.  He was admitted for acute on chronic hypoxic respiratory failure due to CHF and bronchitis.    PT Comments    Pt making good progress with mobility and is ambulatory with walker and min assist. Discussed with pt's son the pt and family's wishes as far as plan at DC. Son reports pt and pt's spouse want pt to return home. Son reports pt could benefit from assistance with ADL's at home to decr burden of care on wife at home. Recommend [facility] aide to assist with this as well as HHPT.   Follow Up Recommendations  Home health PT;Supervision/Assistance - 24 hour (and HH aide)     Equipment Recommendations  None recommended by PT    Recommendations for Other Services       Precautions / Restrictions Precautions Precautions: Fall Precaution Comments: watch VS closely Restrictions Weight Bearing Restrictions: No    Mobility  Bed Mobility Overal bed mobility: Needs Assistance Bed Mobility: Supine to Sit     Supine to sit: Mod assist     General bed mobility comments: Assist to initiate elevation of trunk. Once initiated pt able to complete with min A.  Transfers Overall transfer level: Needs assistance Equipment used: Rolling walker (2 wheeled) Transfers: Sit to/from Stand Sit to Stand: Min assist         General transfer comment: assist to bring hips up and for balance  Ambulation/Gait Ambulation/Gait assistance: Min assist Ambulation Distance (Feet): 150 Feet Assistive device: Rolling walker (2 wheeled) Gait  Pattern/deviations: Step-through pattern;Decreased stride length;Drifts right/left;Trunk flexed Gait velocity: decr Gait velocity interpretation: Below normal speed for age/gender General Gait Details: Assist for balance. Verbal cues for pt to stand more erect and look up. VSS with SpO2 >97% with amb on RA.   Stairs            Wheelchair Mobility    Modified Rankin (Stroke Patients Only)       Balance Overall balance assessment: Needs assistance Sitting-balance support: Feet supported;No upper extremity supported Sitting balance-Leahy Scale: Fair     Standing balance support: Bilateral upper extremity supported Standing balance-Leahy Scale: Poor Standing balance comment: walker and min guard for static standing                    Cognition Arousal/Alertness: Awake/alert Behavior During Therapy: Flat affect Overall Cognitive Status: Impaired/Different from baseline Area of Impairment: Awareness;Problem solving;Memory;Safety/judgement     Memory: Decreased short-term memory;Decreased recall of precautions   Safety/Judgement: Decreased awareness of deficits Awareness: Emergent Problem Solving: Slow processing;Difficulty sequencing;Requires verbal cues;Requires tactile cues      Exercises      General Comments        Pertinent Vitals/Pain Pain Assessment: No/denies pain    Home Living                      Prior Function            PT Goals (current goals can now be found in the care plan section) Progress towards PT  goals: Progressing toward goals    Frequency    Min 3X/week      PT Plan Discharge plan needs to be updated;Frequency needs to be updated    Co-evaluation             End of Session Equipment Utilized During Treatment: Gait belt Activity Tolerance: Patient tolerated treatment well Patient left: with call bell/phone within reach;in chair;with chair alarm set Nurse Communication: Mobility status PT Visit  Diagnosis: Muscle weakness (generalized) (M62.81);Unsteadiness on feet (R26.81)     Time: 1350-1410 PT Time Calculation (min) (ACUTE ONLY): 20 min  Charges:  $Gait Training: 8-22 mins                    G Codes:       Shary Decamp [place] 04-07-16, 3:52 PM [employer] PT [phone]

## 2016-03-19 ENCOUNTER — Telehealth (HOSPITAL_COMMUNITY): Payer: Self-pay

## 2016-03-19 ENCOUNTER — Other Ambulatory Visit: Payer: Self-pay | Admitting: Internal Medicine

## 2016-03-19 DIAGNOSIS — Z7189 Other specified counseling: Secondary | ICD-10-CM

## 2016-03-19 DIAGNOSIS — F0390 Unspecified dementia without behavioral disturbance: Secondary | ICD-10-CM

## 2016-03-19 DIAGNOSIS — Z95810 Presence of automatic (implantable) cardiac defibrillator: Secondary | ICD-10-CM

## 2016-03-19 DIAGNOSIS — Z515 Encounter for palliative care: Secondary | ICD-10-CM

## 2016-03-19 LAB — CBC
HEMATOCRIT: 38.5 % — AB (ref 39.0–52.0)
HEMOGLOBIN: 12.3 g/dL — AB (ref 13.0–17.0)
MCH: 30.1 pg (ref 26.0–34.0)
MCHC: 31.9 g/dL (ref 30.0–36.0)
MCV: 94.1 fL (ref 78.0–100.0)
Platelets: 118 10*3/uL — ABNORMAL LOW (ref 150–400)
RBC: 4.09 MIL/uL — AB (ref 4.22–5.81)
RDW: 17 % — ABNORMAL HIGH (ref 11.5–15.5)
WBC: 6 10*3/uL (ref 4.0–10.5)

## 2016-03-19 LAB — BASIC METABOLIC PANEL
Anion gap: 10 (ref 5–15)
BUN: 18 mg/dL (ref 6–20)
CHLORIDE: 97 mmol/L — AB (ref 101–111)
CO2: 30 mmol/L (ref 22–32)
CREATININE: 0.9 mg/dL (ref 0.61–1.24)
Calcium: 8.5 mg/dL — ABNORMAL LOW (ref 8.9–10.3)
GFR calc Af Amer: >60 mL/min (ref >60)
GLUCOSE: 91 mg/dL (ref 65–99)
Potassium: 4 mmol/L (ref 3.5–5.1)
SODIUM: 137 mmol/L (ref 135–145)

## 2016-03-19 LAB — PROTIME-INR
INR: 2.74
Prothrombin Time: 29.6 seconds — ABNORMAL HIGH (ref 11.4–15.2)

## 2016-03-19 MED ORDER — GUAIFENESIN ER 600 MG PO TB12
1200.0000 mg | ORAL_TABLET | Freq: Two times a day (BID) | ORAL | Status: DC
  Administered 2016-03-19 – 2016-03-21 (×5): 1200 mg via ORAL
  Filled 2016-03-19 (×5): qty 2

## 2016-03-19 MED ORDER — IPRATROPIUM-ALBUTEROL 0.5-2.5 (3) MG/3ML IN SOLN: 3.0000 mL | RESPIRATORY_TRACT | Status: DC | PRN

## 2016-03-19 MED ORDER — FUROSEMIDE 10 MG/ML IJ SOLN
80.0000 mg | Freq: Two times a day (BID) | INTRAMUSCULAR | Status: AC
  Administered 2016-03-19 – 2016-03-20 (×4): 80 mg via INTRAVENOUS
  Filled 2016-03-19 (×4): qty 8

## 2016-03-19 MED ORDER — WARFARIN SODIUM 2 MG PO TABS
3.0000 mg | ORAL_TABLET | Freq: Once | ORAL | Status: AC
  Administered 2016-03-19: 3 mg via ORAL
  Filled 2016-03-19: qty 1

## 2016-03-19 NOTE — Progress Notes (Signed)
PROGRESS NOTE        PATIENT DETAILS Name: Jon Butler Age: 81 y.o. Sex: male Date of Birth: [birth year] Admit Date: 03/15/2016 Admitting Physician Etta Quill, DO MVH:QIONGE,XBMWUX, MD  Brief Narrative: Patient is a 81 y.o. male history of chronic systolic HF EF 32% due to NICM s/p CRT-D ([employer]), mechanical AVR ([employer]), chronic coumadin therapy,LBBB, PAF, ETOH abuse,dementia and h/o non-compliance presented to the ED on 3/10 with acute on chronic hypoxic respiratory failure due to decompensated systolic heart failure and acute bronchitis. He was evaluated by cardiology, started on diuretics and milrinone infusion, but due to ongoing issues with hypotension, and milrinone was discontinued and patient was subsequently started on a dobutamine infusion. Patient/Family do not desire inotropes at home. His ICD interrogation showed 2.5% ATAF burden.See below for further details  Subjective: Feels overall better-continues to have some cough.  Assessment/Plan: Acute hypoxemic respiratory failure: Due to decompensated systolic heart failure and acute bronchitis. Improving with diuretics, empiric levofloxacin and other supportive measures. Will need to be evaluated for home oxygen requirement prior to discharge.  Acute on chronic systolic heart failure (EF by TTE on 03/18/1818-25%): Improved, -2.1 L so far. CHF team following and directing care. Currently remains on dobutamine infusion, IV Lasix and Aldactone.  Acute bronchitis: Slowly improving-still coughing, continue levofloxacin, as needed bronchodilators. Add Mucinex. Blood cultures 3/10 negative so far.  PAF: Continue amiodarone-on coumadin with therapeutic INR. CHADVASC of atleast 3.  Dementia: Appears mild. Awake and alert this morning, however remains at risk for delirium, minimize narcotics and benzodiazepines. Continue home medications along with supportive care.  Gout:continue allopurinol-no  evidence of flare  Dyslipidemia: Continue statin.  Palliative care:DNR in place. Both patient and family do not desire aggressive measures, per cardiology note, ICD has been deactivated. Palliative care meeting with family today, to determine further goals of care and appropriate disposition.  DVT Prophylaxis: Full dose anticoagulation with Coumadin  Code Status:  DNR  Family Communication: Son at bedside  Disposition Plan: Remain inpatient-await palliative care recommendations-Home with hospice vs HHRN/PT  Antimicrobial agents: Anti-infectives    Start     Dose/Rate Route Frequency Ordered Stop   03/16/16 1130  levofloxacin (LEVAQUIN) tablet 750 mg     750 mg Oral Daily 03/16/16 1112 03/20/16 2359      Procedures: Echo 3/12>> The estimated ejection fraction was in the range of 20% to 25%.  CONSULTS:  cardiology and Palliative care  Time spent: 25 minutes-Greater than 50% of this time was spent in counseling, explanation of diagnosis, planning of further management, and coordination of care.  MEDICATIONS: Scheduled Meds: . allopurinol  300 mg Oral Daily  . amiodarone  400 mg Oral Daily  . aspirin EC  81 mg Oral Daily  . donepezil  23 mg Oral QHS  . furosemide  80 mg Intravenous BID  . levofloxacin  750 mg Oral Daily  . LORazepam  0.5 mg Oral QHS  . memantine  10 mg Oral BID  . rosuvastatin  5 mg Oral q1800  . senna-docusate  2 tablet Oral QHS  . sodium chloride flush  3 mL Intravenous Q12H  . spironolactone  12.5 mg Oral Daily  . thiamine  100 mg Oral BID  . traZODone  400 mg Oral QHS  . Warfarin - Pharmacist Dosing Inpatient   Does not apply [phone]  Continuous Infusions: . DOBUTamine 2.5 mcg/kg/min (03/17/16 2000)   PRN Meds:.sodium chloride, acetaminophen, diclofenac sodium, fluticasone, ondansetron (ZOFRAN) IV, sodium chloride flush   PHYSICAL EXAM: Vital signs: Vitals:   03/18/16 2144 03/18/16 2352 03/19/16 0417 03/19/16 0806  BP: 124/82 117/77  111/76 114/68  Pulse: 68 (!) 54 (!) 116 80  Resp: 18 19 20 20   Temp: 97.8 F (36.6 C) 98.5 F (36.9 C) 97.8 F (36.6 C) 97.7 F (36.5 C)  TempSrc: Oral Oral Oral Oral  SpO2: 93% 94% (!) 87% (!) 88%  Weight:   78.2 kg (172 lb 4.8 oz)   Height:       Filed Weights   03/17/16 0350 03/18/16 0608 03/19/16 0417  Weight: 76.7 kg (169 lb) 77.2 kg (170 lb 1.6 oz) 78.2 kg (172 lb 4.8 oz)   Body mass index is 25.44 kg/m.   General appearance :Awake, alert, not in any distress.  Eyes:, pupils equally reactive to light and accomodation HEENT: Atraumatic and Normocephalic Neck: supple, no JVD. No cervical lymphadenopathy.  Resp:Good air entry bilaterally CVS: S1 S2 irregular,+ syst murmur GI: Bowel sounds present, Non tender and not distended with no gaurding, rigidity or rebound. Extremities: B/L Lower Ext shows ++ edema, both legs are warm to touch Neurology:  speech clear,Non focal, sensation is grossly intact. Musculoskeletal:No digital cyanosis Skin:No Rash, warm and dry Wounds:N/A  I have personally reviewed following labs and imaging studies  LABORATORY DATA: CBC:  Recent Labs Lab 03/15/16 2136 03/19/16 0413  WBC 9.3 6.0  NEUTROABS 6.8  --   HGB 13.2 12.3*  HCT 41.5 38.5*  MCV 96.7 94.1  PLT 130* 118*    Basic Metabolic Panel:  Recent Labs Lab 03/15/16 2136 03/16/16 0324 03/17/16 0535 03/18/16 0250 03/19/16 0413  NA 140 141 137 137 137  K 3.9 3.3* 4.1 3.9 4.0  CL 98* 104 100* 100* 97*  CO2 30 30 31 30 30   GLUCOSE 118* 131* 91 75 91  BUN 30* 26* 24* 17 18  CREATININE 1.06 0.92 0.93 0.77 0.90  CALCIUM 8.6* 8.0* 8.0* 8.4* 8.5*  MG  --   --  2.0 2.0  --     GFR: Estimated Creatinine Clearance: 67.6 mL/min (by C-G formula based on SCr of 0.9 mg/dL).  Liver Function Tests:  Recent Labs Lab 03/15/16 2136  AST 20  ALT 15*  ALKPHOS 93  BILITOT 1.3*  PROT 6.3*  ALBUMIN 3.5   No results for input(s): LIPASE, AMYLASE in the last 168 hours. No results  for input(s): AMMONIA in the last 168 hours.  Coagulation Profile:  Recent Labs Lab 03/15/16 2136 03/16/16 0324 03/17/16 0535 03/18/16 0648 03/19/16 0413  INR 2.58 2.75 3.70 3.25 2.74    Cardiac Enzymes:  Recent Labs Lab 03/15/16 2230 03/16/16 0324 03/16/16 0852 03/16/16 1409  TROPONINI 0.06* 0.06* 0.07* 0.06*    BNP (last 3 results) No results for input(s): PROBNP in the last 8760 hours.  HbA1C: No results for input(s): HGBA1C in the last 72 hours.  CBG: No results for input(s): GLUCAP in the last 168 hours.  Lipid Profile: No results for input(s): CHOL, HDL, LDLCALC, TRIG, CHOLHDL, LDLDIRECT in the last 72 hours.  Thyroid Function Tests: No results for input(s): TSH, T4TOTAL, FREET4, T3FREE, THYROIDAB in the last 72 hours.  Anemia Panel: No results for input(s): VITAMINB12, FOLATE, FERRITIN, TIBC, IRON, RETICCTPCT in the last 72 hours.  Urine analysis:    Component Value Date/Time   COLORURINE YELLOW 03/16/2016 0843  APPEARANCEUR CLEAR 03/16/2016 0843   LABSPEC 1.011 03/16/2016 0843   PHURINE 5.0 03/16/2016 0843   GLUCOSEU NEGATIVE 03/16/2016 0843   HGBUR NEGATIVE 03/16/2016 0843   BILIRUBINUR NEGATIVE 03/16/2016 0843   KETONESUR NEGATIVE 03/16/2016 0843   PROTEINUR NEGATIVE 03/16/2016 0843   UROBILINOGEN 1.0 08/08/2014 2229   NITRITE NEGATIVE 03/16/2016 0843   LEUKOCYTESUR SMALL (A) 03/16/2016 0843    Sepsis Labs: Lactic Acid, Venous    Component Value Date/Time   LATICACIDVEN 1.55 03/15/2016 2150    MICROBIOLOGY: Recent Results (from the past 240 hour(s))  Culture, blood (Routine x 2)     Status: None (Preliminary result)   Collection Time: 03/15/16  9:36 PM  Result Value Ref Range Status   Specimen Description BLOOD RIGHT ANTECUBITAL  Final   Special Requests BOTTLES DRAWN AEROBIC AND ANAEROBIC 5CC EA  Final   Culture NO GROWTH 4 DAYS  Final   Report Status PENDING  Incomplete  Culture, blood (Routine x 2)     Status: None (Preliminary  result)   Collection Time: 03/15/16 10:20 PM  Result Value Ref Range Status   Specimen Description BLOOD LEFT ANTECUBITAL  Final   Special Requests BOTTLES DRAWN AEROBIC AND ANAEROBIC 5CC EA  Final   Culture NO GROWTH 4 DAYS  Final   Report Status PENDING  Incomplete    RADIOLOGY STUDIES/RESULTS: Dg Chest 2 View  Result Date: 03/15/2016 CLINICAL DATA:  Cough and rhonchi EXAM: CHEST  2 VIEW COMPARISON:  05/09/2015 FINDINGS: Moderate cardiomegaly.  The transvenous cardiac leads appear intact. Moderate vascular and interstitial prominence. Hazy central lung opacities bilaterally. Small right pleural effusion. IMPRESSION: The findings likely represent congestive heart failure with interstitial and alveolar edema, as well a small right pleural effusion. Electronically Signed   By: Andreas Newport M.D.   On: 03/15/2016 22:21     LOS: 3 days   Oren Binet, MD  [facility] Pager:[phone]  If 7PM-7AM, please contact night-coverage [email or web address] Password [email or web address] 03/19/2016, 11:07 AM

## 2016-03-19 NOTE — Telephone Encounter (Signed)
PA approved for Entresto 24/26 by [facility] Medicare Part D through 03/07/2017.  Belia Heman, PharmD PGY1 Pharmacy Resident [phone] (Pager) 03/19/2016 4:40 PM

## 2016-03-19 NOTE — Care Management Note (Addendum)
Case Management Note  Patient Details  Name: Jon Butler MRN: [id number] Date of Birth: Feb 08, 1935  Subjective/Objective: Pt presented for CHF Exacerbation. Continues on IV lasix bid. Palliative Care is following. [place] 03-19-16 decision was made to return home with Hospice Services. Pt is from home with wife.                   Action/Plan: CM was able to speak with patient and wife at bedside. Hospice Agency List provided to patient and wife. Choice was made to use [facility]. CM did make referral with Manus Gunning. Manus Gunning to reach out to [facility] and she will contact the wife. We discussed DME and pt will need Hospital Bed and Oxygen. DME to be ordered via [facility]. No further needs at this time. Will continue to monitor.   Expected Discharge Date:  03/18/16               Expected Discharge Plan:  Home w Hospice Care  In-House Referral:  NA  Discharge planning Services  CM Consult  Post Acute Care Choice:  Hospice Choice offered to:  Patient, Spouse  DME Arranged:  [facility] uses [facility] for DME.   DME Agency:  [place] uses [facility] for DME. Hospital Bed and Oxygen  HH Arranged:  RN [facility] Agency:  [place]  Status of Service:  Completed, signed off  If discussed at [employer] of Stay Meetings, dates discussed:  03-20-16   Additional Comments: 1219 03-21-16 Jacqlyn Krauss, RN,BSN [phone] CM did contact Catharine for [facility] did make PTAR appointment for 1:30 pickup. Pt/ family is aware- Per wife DME is at home hospital bed and 02. No further needs from CM at this time,    1116 03-20-16 Jacqlyn Krauss, RN,BSN [phone]  Pt discusse in [place] and per Physician Advisor he would like for [facility] presence in the home. Referral made to [facility] as well. CM will continue to monitor for additional needs.  Bethena Roys, RN 03/19/2016, 3:14 PM

## 2016-03-19 NOTE — Progress Notes (Signed)
Physical Therapy Treatment Patient Details Name: Jon Butler MRN: [id number] DOB: Jul 21, 1935 Today's Date: 03/19/2016    History of Present Illness 81 y.o. male with medical history significant of CHF with EF 25-30%, AICD / biventricular pacer, A.Fib, NICM, dementia.  Patient presents to the ED with family with AMS.  He has had SOB, cough productive of whiteish sputum, leg swelling (R>L but he always has this) worsening over the past 2 weeks.  He was admitted for acute on chronic hypoxic respiratory failure due to CHF and bronchitis.    PT Comments    Pt demonstrating decreased mobility tolerance and increased need for assistance throughout session. Attempting ambulation X2 but pt unable to perform safely beyond 5 ft. Family confirming that they are planning to D/C to home following hospital stay. PT to continue to follow to progress mobility and safety.    Follow Up Recommendations  Home health PT;Supervision/Assistance - 24 hour (and [facility] aide)     Equipment Recommendations  None recommended by PT    Recommendations for Other Services       Precautions / Restrictions Precautions Precautions: Fall Restrictions Weight Bearing Restrictions: No    Mobility  Bed Mobility Overal bed mobility: Needs Assistance Bed Mobility: Supine to Sit Rolling: Mod assist   Supine to sit: Max assist     General bed mobility comments: Pt needing repeated cues for sequence, assist provided at LEs and trunk. Assist with rolling and sitting in bed for gown change prior to session.   Transfers Overall transfer level: Needs assistance Equipment used: Rolling walker (2 wheeled) Transfers: Sit to/from Stand Sit to Stand: Mod assist         General transfer comment: cues needded for sequence and hand placement. Repeating from EOB X2.   Ambulation/Gait Ambulation/Gait assistance: Min assist Ambulation Distance (Feet): 5 Feet Assistive device: Rolling walker (2 wheeled) Gait  Pattern/deviations: Step-through pattern;Decreased stride length;Trunk flexed Gait velocity: very slow mobility    General Gait Details: Attempting ambulation X2, able to perform marching at EOB initially with pt requesting seated break. On second attempt, pt able to ambulate 5 ft to chair with assist for balance, sequencing, and maneuvering of device.    Stairs            Wheelchair Mobility    Modified Rankin (Stroke Patients Only)       Balance Overall balance assessment: Needs assistance Sitting-balance support: Bilateral upper extremity supported;Feet supported Sitting balance-Leahy Scale: Fair Sitting balance - Comments: able to sit EOB with assistance   Standing balance support: Bilateral upper extremity supported Standing balance-Leahy Scale: Poor Standing balance comment: requiring rw and physical assist.                     Cognition Arousal/Alertness: Awake/alert Behavior During Therapy: Flat affect Overall Cognitive Status: History of cognitive impairments - at baseline Area of Impairment: Attention;Following commands;Safety/judgement               General Comments: Pt needing repeated cues for safety and orientation to task.     Exercises      General Comments General comments (skin integrity, edema, etc.): Vital signs remaining stable throughout session.       Pertinent Vitals/Pain Pain Assessment: No/denies pain    Home Living                      Prior Function            PT Goals (  current goals can now be found in the care plan section) Acute Rehab PT Goals Patient Stated Goal: go home PT Goal Formulation: With patient Time For Goal Achievement: 03/30/16 Potential to Achieve Goals: Good Progress towards PT goals:  (decreased mobility compared to prior session)    Frequency    Min 3X/week      PT Plan Current plan remains appropriate    Co-evaluation             End of Session Equipment Utilized  During Treatment: Gait belt;Oxygen Activity Tolerance: Patient limited by fatigue Patient left: in chair;with call bell/phone within reach;with chair alarm set;with family/visitor present Nurse Communication: Mobility status PT Visit Diagnosis: Muscle weakness (generalized) (M62.81);Unsteadiness on feet (R26.81)     Time: 6384-6659 PT Time Calculation (min) (ACUTE ONLY): 43 min  Charges:  $Therapeutic Activity: 38-52 mins                    G Codes:       Cassell Clement, PT, CSCS Pager [phone] Office [phone]  03/19/2016, 12:44 PM

## 2016-03-19 NOTE — Consult Note (Signed)
Consultation Note Date: 03/19/2016   Patient Name: Jon Butler  DOB: Mar 04, 1935  MRN: [id number]  Age / Sex: 81 y.o., male  PCP: [name], MD Referring Physician: Jonetta Osgood, MD  Reason for Consultation: Establishing goals of care  HPI/Patient Profile: 81 y.o. male  with past medical history of chronic systolic HF EF 17% due to NICM s/p CRT-D (Medtronic), mechanical AVR (St Jude), chronic coumadin therapy,LBBB, PAF, ETOH abuse,dementia and h/o non-compliance. He presented to the ED with SOB and was subsequently admitted on 03/15/2016 with acute on chronic respiratory failure secondary to decompensated sCHF and acute bronchitis. Heart failure team has been following; pt on dobutamine and diuresis ongoing. Pt does not want inotropes at home and ICD had already been turned off during a prior admission. Pt is extremely debilitated. Palliative consulted to assist family in goals of care and aid in disposition planning.  Clinical Assessment and Goals of Care: I met Jon Butler at his bedside. He has dementia with memory deficits. His wife, [name], and one of his sons, [name], were present. We briefly talked about his health state. His family had a very good understanding of the seriousness of his heart issues: they would not get better, and he would continue to decline. They express the goal is to have him at home with as much good time as possible.   We discussed two different paths of support at home. The first is to utilize home health services, with the plan to return to the hospital for acute issues. This option would enable some support at home, however it would be time limited and he would have to demonstrate progress (such as with PT). The other path would be to go home with Hospice support, with the plan to remain at home and focus care on symptom management and quality of life--with no further hospitalizations. Hospice would be able to provide  an aid in escalating visits as needed, as well as RN support for medication, and 24 hour contact for acute issues.   After discussing each option, Jon Butler and his family elected to pursue Hospice support at home. Jon Butler is comfortable with no further hospitalizations, and his family is appreciative of the support Hospice can provide. They understand the plan is to medically optimize before discharge, then transition home with Hospice.   Primary Decision Maker PATIENT. Pt has dementia with memory deficits. His wife and son, [name], share [place].   SUMMARY OF RECOMMENDATIONS    DNR, medically optimize then discharge home with Hospice support  CM consulted to facilitate Hospice referral   Code Status/Advance Care Planning:  DNR  Symptom Management:   Pt reports feeling well today. He has no acute complaints and is just eager to get home.  Additional Recommendations (Limitations, Scope, Preferences):  Medically optimize then discharge home with Hospice.  Psycho-social/Spiritual:   Desire for further Chaplaincy support:no  Additional Recommendations: Education on Hospice  Prognosis:   < 6 months  Discharge Planning: Home with Hospice      Primary Diagnoses: Present on Admission: . Acute on chronic combined systolic and diastolic heart failure ([place]) . Biventricular implantable cardioverter-defibrillator in situ . Moderate dementia without behavioral disturbance . Atrial fibrillation ([place])   I have reviewed the medical record, interviewed the patient and family, and examined the patient. The following aspects are pertinent.  Past Medical History:  Diagnosis Date  . Atrial fibrillation ([place])   . Cardiomyopathy   . Chronic systolic heart failure ([place])   .  Dementia   . Left bundle-branch block   . Pneumonia 08/2014   Social History   Social History  . Marital status: Married    Spouse name: [name]  . Number of children: 5  . Years of education: 12    Occupational History  . retired Retired   Social History Main Topics  . Smoking status: Never Smoker  . Smokeless tobacco: Never Used  . Alcohol use 0.0 oz/week    2 - 3 Glasses of wine per week     Comment: 2-3 glasses  . Drug use: No  . Sexual activity: No   Other Topics Concern  . None   Social History Narrative   Patient is married [name]).   Patient is retired.   5 children    12th grade   2 cups caffeine daily   Family History  Problem Relation Age of Onset  . Aneurysm Mother     brain  . Heart attack Father 82   Scheduled Meds: . allopurinol  300 mg Oral Daily  . amiodarone  400 mg Oral Daily  . aspirin EC  81 mg Oral Daily  . donepezil  23 mg Oral QHS  . furosemide  80 mg Intravenous BID  . levofloxacin  750 mg Oral Daily  . LORazepam  0.5 mg Oral QHS  . memantine  10 mg Oral BID  . rosuvastatin  5 mg Oral q1800  . senna-docusate  2 tablet Oral QHS  . sodium chloride flush  3 mL Intravenous Q12H  . spironolactone  12.5 mg Oral Daily  . thiamine  100 mg Oral BID  . traZODone  400 mg Oral QHS  . Warfarin - Pharmacist Dosing Inpatient   Does not apply q1800   Continuous Infusions: . DOBUTamine 2.5 mcg/kg/min (03/17/16 2000)   PRN Meds:.sodium chloride, acetaminophen, diclofenac sodium, fluticasone, ondansetron (ZOFRAN) IV, sodium chloride flush No Known Allergies   Review of Systems  Constitutional: Positive for activity change, appetite change and fatigue.  HENT: Negative for congestion, hearing loss, sore throat and trouble swallowing.   Eyes: Negative for visual disturbance.  Respiratory: Positive for cough. Negative for shortness of breath (much better compared to admission) and wheezing.   Cardiovascular: Positive for leg swelling.  Gastrointestinal: Negative for abdominal distention and abdominal pain.  Genitourinary: Negative for flank pain.  Musculoskeletal: Positive for gait problem. Negative for back pain.  Skin: Positive for pallor.   Neurological: Positive for weakness. Negative for dizziness and speech difficulty.  Psychiatric/Behavioral: Positive for confusion (dementia). Negative for sleep disturbance. The patient is not nervous/anxious.    Physical Exam  Constitutional: No distress.  HENT:  Head: Normocephalic and atraumatic.  Mouth/Throat: Oropharynx is clear and moist. No oropharyngeal exudate.  Eyes: EOM are normal.  Neck: Normal range of motion.  Cardiovascular: Normal rate.   Pulmonary/Chest: Effort normal. No respiratory distress. He has no wheezes.  Abdominal: Soft.  Musculoskeletal: He exhibits edema (1-2+ BLE).  Marked generalized weakness  Neurological: He is alert.  Fully oriented.   Skin: Skin is warm and dry. There is pallor.  Psychiatric: Judgment and thought content normal. His speech is delayed. He is slowed. He exhibits abnormal recent memory.  Flat affect   Vital Signs: BP 114/68 (BP Location: Right Arm)   Pulse 80   Temp 97.7 F (36.5 C) (Oral)   Resp 20   Ht _0  (1.753 m)   Wt 78.2 kg (172 lb 4.8 oz)   SpO2 (!) 88%   BMI  25.44 kg/m  Pain Assessment: No/denies pain   Pain Score: 0-No pain  SpO2: SpO2: (!) 88 % O2 Device:SpO2: (!) 88 % O2 Flow Rate: .   IO: Intake/output summary:  Intake/Output Summary (Last 24 hours) at 03/19/16 1107 Last data filed at 03/19/16 0855  Gross per 24 hour  Intake             1560 ml  Output             2450 ml  Net             -890 ml    LBM: Last BM Date: 03/18/16 Baseline Weight: Weight: 74.8 kg (165 lb) Most recent weight: Weight: 78.2 kg (172 lb 4.8 oz)     Palliative Assessment/Data: PPS 40-50%   Flowsheet Rows   Flowsheet Row Most Recent Value  Intake Tab  Referral Department  Cardiology  Unit at Time of Referral  Cardiac/Telemetry Unit  Palliative Care Primary Diagnosis  Cardiac  Date Notified  03/18/16  Palliative Care Type  Return patient Palliative Care  Reason for referral  Clarify Goals of Care  Date of Admission   03/15/16  # of days IP prior to Palliative referral  3  Clinical Assessment  Psychosocial & Spiritual Assessment  Palliative Care Outcomes      Time Total: 50 minutes Greater than 50%  of this time was spent counseling and coordinating care related to the above assessment and plan.  Signed by: Charlynn Court, NP Palliative Medicine Team Pager # [phone] (M-F 7a-5p) Team Phone # [phone] (Nights/Weekends)

## 2016-03-19 NOTE — Progress Notes (Signed)
Advanced Heart Failure Rounding Note  PCP: Dr. Lysle Rubens Primary Cardiologist: Dr. Haroldine Laws   Subjective:    81 yo male with a history of chronic systolic HF EF 19% due to NICM s/p CRT-D (Medtronic), mechanical AVR (St Jude), chronic coumadin therapy, LBBB, PAF,  ETOH abuse, dementia and h/o non-compliance.  Presented to ED 03/15/16 with hypoxia in the setting of recent URI and found to be in acute on chronic systolic CHF  Started on milrinone on 03/16/16. Pressures soft so switched to dobutamine. Family does not want inotropes at home, so no PICC line placed.   ICD interrogation reviewed.  2.5% AT/AF burden. No VT/VF noted, but VT/VF detection off.  BiV pacing 92% of the time.  Fluid index below threshold.   Yesterday diuresed with IV lasix. Remains on dobutamine 2.5 mcg. Over night oxygen sats dropping < 88%. Confused at night. Productive cough over night.   Denies SOB.      Objective:   Weight Range: 172 lb 4.8 oz (78.2 kg) Body mass index is 25.44 kg/m.   Vital Signs:   Temp:  [97.2 F (36.2 C)-98.5 F (36.9 C)] 97.7 F (36.5 C) (03/14 0806) Pulse Rate:  [54-116] 80 (03/14 0806) Resp:  [16-21] 20 (03/14 0806) BP: (111-124)/(68-82) 114/68 (03/14 0806) SpO2:  [87 %-96 %] 88 % (03/14 0806) Weight:  [172 lb 4.8 oz (78.2 kg)] 172 lb 4.8 oz (78.2 kg) (03/14 0417) Last BM Date: 03/15/16  Weight change: Filed Weights   03/17/16 0350 03/18/16 0608 03/19/16 0417  Weight: 169 lb (76.7 kg) 170 lb 1.6 oz (77.2 kg) 172 lb 4.8 oz (78.2 kg)    Intake/Output:   Intake/Output Summary (Last 24 hours) at 03/19/16 0820 Last data filed at 03/19/16 0600  Gross per 24 hour  Intake             1560 ml  Output             2450 ml  Net             -890 ml     Physical Exam: General: Ill appearing male, fatigued. In bed.  HEENT: normal Neck: supple. JVP elevated to jaw. Carotids +2 bilat; no bruits. No thyromegaly or nodule noted.  Cor: Irregular rhythm. Rate controlled. + s3.  Mechanical S2 Lungs: Decreased RLL LLL. On 2 liters..  Abdomen: soft, NT, ND, no HSM. No bruits or masses. +BS  Extremities: Warm. no cyanosis, clubbing, rash. Rand LLE 2+ edema. Ted hose in place. .  Neuro: alert & oriented to person and place, cranial nerves grossly intact. moves all 4 extremities w/o difficulty. Affect flat.   Telemetry: Personally reviewed, BiV pacing, frequent PVC's    Labs: CBC  Recent Labs  03/19/16 0413  WBC 6.0  HGB 12.3*  HCT 38.5*  MCV 94.1  PLT 379*   Basic Metabolic Panel  Recent Labs  03/17/16 0535 03/18/16 0250 03/19/16 0413  NA 137 137 137  K 4.1 3.9 4.0  CL 100* 100* 97*  CO2 31 30 30   GLUCOSE 91 75 91  BUN 24* 17 18  CREATININE 0.93 0.77 0.90  CALCIUM 8.0* 8.4* 8.5*  MG 2.0 2.0  --    Liver Function Tests No results for input(s): AST, ALT, ALKPHOS, BILITOT, PROT, ALBUMIN in the last 72 hours. Cardiac Enzymes  Recent Labs  03/16/16 0852 03/16/16 1409  TROPONINI 0.07* 0.06*    BNP: BNP (last 3 results)  Recent Labs  03/15/16 2152  BNP >  4,500.0*    Imaging/Studies:  Transthoracic Echocardiography 03/17/16 Study Conclusions  - Left ventricle: The cavity size was severely dilated. Wall   thickness was normal. Systolic function was severely reduced. The   estimated ejection fraction was in the range of 20% to 25%.   Diffuse hypokinesis. Doppler parameters are consistent with a   reversible restrictive pattern, indicative of decreased left   ventricular diastolic compliance and/or increased left atrial   pressure (grade 3 diastolic dysfunction). - Aortic valve: A mechanical prosthesis was present and functioning   normally. Mean gradient (S): 7 mm Hg. Valve area (VTI): 2.46   cm^2. Valve area (Vmax): 2.33 cm^2. Valve area (Vmean): 2.27   cm^2. - Mitral valve: There was moderate to severe regurgitation. - Left atrium: The atrium was severely dilated. - Right ventricle: The cavity size was mildly dilated. Wall    thickness was normal. Pacer wire or catheter noted in right   ventricle.  Impressions:  - When compared to prior, mitral regurgitation has increased.    Medications:     Scheduled Medications: . allopurinol  300 mg Oral Daily  . amiodarone  400 mg Oral Daily  . aspirin EC  81 mg Oral Daily  . donepezil  23 mg Oral QHS  . levofloxacin  750 mg Oral Daily  . LORazepam  0.5 mg Oral QHS  . memantine  10 mg Oral BID  . rosuvastatin  5 mg Oral q1800  . senna-docusate  2 tablet Oral QHS  . sodium chloride flush  3 mL Intravenous Q12H  . spironolactone  12.5 mg Oral Daily  . thiamine  100 mg Oral BID  . traZODone  400 mg Oral QHS  . Warfarin - Pharmacist Dosing Inpatient   Does not apply q1800    Infusions: . DOBUTamine 2.5 mcg/kg/min (03/17/16 2000)    PRN Medications: sodium chloride, acetaminophen, diclofenac sodium, fluticasone, ondansetron (ZOFRAN) IV, sodium chloride flush   Assessment/Plan   1. Acute on chronic systolic HF with probable low output   NICM EF 20% 2. PAF 3. Acute respiratory failure with possible RLL infiltrate with hypoxia  4. Mechanical AVR 5. Hupokalemia 6. DNR 7. NSVT 8. Medtronic ICD- Deactivated.    Continue dobutamine 2.5 mg today. Plan to stop dobutamine once diuresed.  No plan to send home on dobutamine. Volume status elevated.  Weight trending up. Diurese with 80 mg IV lasix twice a day. Renal function stable.   Remain on antibiotics for CAP. Levofloxacin. O2 sats < 88% this morning and over night. Continue 2 liters oxygen. Will need oxygen when discharged.   ICD has been turned off. Mg 2.0.   Palliative Care consult pending. Family meeting toady at 1300.   Length of Stay: 3  Amy Clegg, NP  03/19/2016, 8:20 AM  Advanced Heart Failure Team Pager [phone] (M-F; 7a - 4p)  Please contact [facility] for night-coverage after hours (4p -7a ) and weekends on [email or web address]  Patient seen and examined with Darrick Grinder, NP. We discussed  all aspects of the encounter. I agree with the assessment and plan as stated above.   Remains volume overloaded. Renal function stable. Agree with continuing dobutamine and increasing lasix to 80 IV bid. Still with extensive ectopy on monitor with brief bursts of AF. Seems to be improving somewhat on increased amio. ICD interrogation reviewed with his son.   Overall extremely debilitated. Unable to sit up independently in bed. Agree with Hosp ice evaluation. Will have family meeting at [place].  Continue abx for RLL/RML PNA. Remains very junky on exam. Continue flutter valv.e   Continue coumadin for AF and AVR.   Adeleine Pask,MD 10:41 AM

## 2016-03-19 NOTE — Progress Notes (Signed)
ANTICOAGULATION CONSULT NOTE - Follow Up Consult  Pharmacy Consult for Coumadin Indication: atrial fibrillation  No Known Allergies  Patient Measurements: Height: 5\' 9"  (175.3 cm) Weight: 172 lb 4.8 oz (78.2 kg) IBW/kg (Calculated) : 70.7  Vital Signs: Temp: 98.6 F (37 C) (03/14 1139) Temp Source: Oral (03/14 1139) BP: 105/67 (03/14 1139) Pulse Rate: 72 (03/14 1139)  Labs:  Recent Labs  03/16/16 1409 03/17/16 0535 03/18/16 0250 03/18/16 0648 03/19/16 0413  HGB  --   --   --   --  12.3*  HCT  --   --   --   --  38.5*  PLT  --   --   --   --  118*  LABPROT  --  37.6*  --  33.9* 29.6*  INR  --  3.70  --  3.25 2.74  CREATININE  --  0.93 0.77  --  0.90  TROPONINI 0.06*  --   --   --   --     Estimated Creatinine Clearance: 67.6 mL/min (by C-G formula based on SCr of 0.9 mg/dL).  Assessment: 80yom on coumadin for afib. INR 2.58 on admit and he received a dose. INR then trended up to 3.7, doses held 3/12 and 3/13. INR back within goal range today at 2.74. Jump in INR could have been due to drug interaction with levaquin (3/11 > 3/15) or increased amiodarone dose (200mg  daily pta, 400mg  daily here). CBC stable. No bleeding.  Home dose: 2mg  daily except 3mg  MWF - last taken 3/10  Goal of Therapy:  INR 2-3  Monitor platelets by anticoagulatino protocol: Yes   Plan:  1) Coumadin 3mg  x 1 2) Daily INR  Nena Jordan, PharmD, BCPS 03/19/2016 1:01 PM

## 2016-03-19 NOTE — Consult Note (Signed)
[facility] CM Primary Care Navigator  03/19/2016  Jon Butler [facility] 08-07-1935 [id number]   Met with patient and son Jon Butler) at the bedside to identify possible discharge needs. Patient's son reports that patient had colds for a week with non-productive cough and drop in oxygen level on the mid 80's that had led to this admission.  Patient endorses Dr. Wenda Butler with [facility] at [facility] as the primary care provider.    Patient shared using [employer] and [facility] at [facility] to obtain medications without any problem.   Patient reports that wife Jon Butler) manages his medications at home using "pill box" system.   Wife provides transportation to his doctors' appointments.  Wife is patient's primary caregiver but may need another caregiver to assist wife with his care at home per son.  [name] list of personal care services provided as a resource to use when needed, with the understanding that he will pay out of the pocket for it.  Anticipated discharge plan is home per son and states that they are "not open" to skilled nursing or rehabilitation facility.  Patient and son voiced understanding to call primary care provider's office when he returns back home for a post discharge follow-up appointment within a week or sooner if needs arise. Patient letter (with PCP's contact number) was provided as a reminder.  Son reports that patient is under the care of HF clinic for about 3 years now and being followed by Dr. Candee Butler (cardiologist) for the management of HF. [facility] care management contact information provided for future needs that patient may have. Inpatient care manager notified of son's concern for home oxygen.  Son had mentioned that they will meet with Palliative care team this afternoon to discuss plans for patient's care.   For additional questions please contact:  Jon Butler, BSN, RN-BC [facility] PRIMARY CARE  Navigator Cell: [phone]

## 2016-03-20 DIAGNOSIS — I428 Other cardiomyopathies: Secondary | ICD-10-CM

## 2016-03-20 LAB — CULTURE, BLOOD (ROUTINE X 2)
Culture: NO GROWTH
Culture: NO GROWTH

## 2016-03-20 LAB — BASIC METABOLIC PANEL
ANION GAP: 6 (ref 5–15)
BUN: 17 mg/dL (ref 6–20)
CHLORIDE: 96 mmol/L — AB (ref 101–111)
CO2: 34 mmol/L — ABNORMAL HIGH (ref 22–32)
Calcium: 8.3 mg/dL — ABNORMAL LOW (ref 8.9–10.3)
Creatinine, Ser: 1 mg/dL (ref 0.61–1.24)
GFR calc Af Amer: >60 mL/min (ref >60)
Glucose, Bld: 90 mg/dL (ref 65–99)
POTASSIUM: 3.8 mmol/L (ref 3.5–5.1)
Sodium: 136 mmol/L (ref 135–145)

## 2016-03-20 LAB — CBC
HCT: 38 % — ABNORMAL LOW (ref 39.0–52.0)
HEMOGLOBIN: 12.4 g/dL — AB (ref 13.0–17.0)
MCH: 31 pg (ref 26.0–34.0)
MCHC: 32.6 g/dL (ref 30.0–36.0)
MCV: 95 fL (ref 78.0–100.0)
PLATELETS: 118 10*3/uL — AB (ref 150–400)
RBC: 4 MIL/uL — AB (ref 4.22–5.81)
RDW: 17 % — ABNORMAL HIGH (ref 11.5–15.5)
WBC: 6 10*3/uL (ref 4.0–10.5)

## 2016-03-20 LAB — PROTIME-INR
INR: 2.59
PROTHROMBIN TIME: 28.3 s — AB (ref 11.4–15.2)

## 2016-03-20 MED ORDER — WARFARIN SODIUM 2 MG PO TABS
3.0000 mg | ORAL_TABLET | Freq: Once | ORAL | Status: AC
  Administered 2016-03-20: 17:00:00 3 mg via ORAL
  Filled 2016-03-20: qty 1

## 2016-03-20 MED ORDER — FUROSEMIDE 80 MG PO TABS
80.0000 mg | ORAL_TABLET | Freq: Every day | ORAL | Status: DC
  Administered 2016-03-21: 80 mg via ORAL
  Filled 2016-03-20: qty 1

## 2016-03-20 NOTE — Progress Notes (Signed)
  Advanced Heart Failure Rounding Note  PCP: Dr. Husain Primary Cardiologist: Dr. Bensimhon   Subjective:    80 yo male with a history of chronic systolic HF EF 20% due to NICM s/p CRT-D (Medtronic), mechanical AVR (St Jude), chronic coumadin therapy, LBBB, PAF,  ETOH abuse, dementia and h/o non-compliance.  Presented to ED 03/15/16 with hypoxia in the setting of recent URI and found to be in acute on chronic systolic CHF  Started on milrinone on 03/16/16. Pressures soft so switched to dobutamine. Family does not want inotropes at home, so no PICC line placed.   ICD interrogation reviewed.  2.5% AT/AF burden. No VT/VF noted, but VT/VF detection off.  BiV pacing 92% of the time.  Fluid index below threshold.   Yesterday diuresed with IV lasix+ dobutamine 2.5 mcg. Weight trending down 170>164 pounds. Cough improved.   Plans to discharge with hospice.   Denies SOB. Hungry.      Objective:   Weight Range: 164 lb 9.6 oz (74.7 kg) Body mass index is 24.31 kg/m.   Vital Signs:   Temp:  [97.8 F (36.6 C)-99 F (37.2 C)] 98.2 F (36.8 C) (03/15 0759) Pulse Rate:  [51-72] 51 (03/15 0452) Resp:  [12-21] 12 (03/15 0452) BP: (101-105)/(67-74) 101/68 (03/15 0452) SpO2:  [96 %-100 %] 100 % (03/15 0730) Weight:  [164 lb 9.6 oz (74.7 kg)] 164 lb 9.6 oz (74.7 kg) (03/15 0452) Last BM Date: 03/20/16  Weight change: Filed Weights   03/18/16 0608 03/19/16 0417 03/20/16 0452  Weight: 170 lb 1.6 oz (77.2 kg) 172 lb 4.8 oz (78.2 kg) 164 lb 9.6 oz (74.7 kg)    Intake/Output:   Intake/Output Summary (Last 24 hours) at 03/20/16 0842 Last data filed at 03/20/16 0452  Gross per 24 hour  Intake              790 ml  Output             1700 ml  Net             -910 ml     Physical Exam: General: Ill appearing male, fatigued. In bed. Wife at bedside HEENT: normal Neck: supple. JVP 9-10 Carotids +2 bilat; no bruits. No thyromegaly or nodule noted.  Cor: Irregular rhythm. Rate  controlled.Mechanical S2 Lungs: Decreased RLL LLL. On 2 liters..  Abdomen: soft, NT, ND, no HSM. No bruits or masses. +BS  Extremities: Warm. no cyanosis, clubbing, rash. Rand LLE 1-2+ edema. Ted hose in place. .  Neuro: alert & oriented to person and place, cranial nerves grossly intact. moves all 4 extremities w/o difficulty. Affect flat.   Telemetry: Personally reviewed, Mostly NSR  BiV pacing, frequent PVC's    Labs: CBC  Recent Labs  03/19/16 0413 03/20/16 0251  WBC 6.0 6.0  HGB 12.3* 12.4*  HCT 38.5* 38.0*  MCV 94.1 95.0  PLT 118* 118*   Basic Metabolic Panel  Recent Labs  03/18/16 0250 03/19/16 0413 03/20/16 0251  NA 137 137 136  K 3.9 4.0 3.8  CL 100* 97* 96*  CO2 30 30 34*  GLUCOSE 75 91 90  BUN 17 18 17  CREATININE 0.77 0.90 1.00  CALCIUM 8.4* 8.5* 8.3*  MG 2.0  --   --    Liver Function Tests No results for input(s): AST, ALT, ALKPHOS, BILITOT, PROT, ALBUMIN in the last 72 hours. Cardiac Enzymes No results for input(s): CKTOTAL, CKMB, CKMBINDEX, TROPONINI in the last 72 hours.  BNP: BNP (last   3 results)  Recent Labs  03/15/16 2152  BNP >4,500.0*    Imaging/Studies:  Transthoracic Echocardiography 03/17/16 Study Conclusions  - Left ventricle: The cavity size was severely dilated. Wall   thickness was normal. Systolic function was severely reduced. The   estimated ejection fraction was in the range of 20% to 25%.   Diffuse hypokinesis. Doppler parameters are consistent with a   reversible restrictive pattern, indicative of decreased left   ventricular diastolic compliance and/or increased left atrial   pressure (grade 3 diastolic dysfunction). - Aortic valve: A mechanical prosthesis was present and functioning   normally. Mean gradient (S): 7 mm Hg. Valve area (VTI): 2.46   cm^2. Valve area (Vmax): 2.33 cm^2. Valve area (Vmean): 2.27   cm^2. - Mitral valve: There was moderate to severe regurgitation. - Left atrium: The atrium was severely  dilated. - Right ventricle: The cavity size was mildly dilated. Wall   thickness was normal. Pacer wire or catheter noted in right   ventricle.  Impressions:  - When compared to prior, mitral regurgitation has increased.    Medications:     Scheduled Medications: . allopurinol  300 mg Oral Daily  . amiodarone  400 mg Oral Daily  . aspirin EC  81 mg Oral Daily  . donepezil  23 mg Oral QHS  . furosemide  80 mg Intravenous BID  . guaiFENesin  1,200 mg Oral BID  . LORazepam  0.5 mg Oral QHS  . memantine  10 mg Oral BID  . rosuvastatin  5 mg Oral q1800  . senna-docusate  2 tablet Oral QHS  . sodium chloride flush  3 mL Intravenous Q12H  . spironolactone  12.5 mg Oral Daily  . thiamine  100 mg Oral BID  . traZODone  400 mg Oral QHS  . Warfarin - Pharmacist Dosing Inpatient   Does not apply q1800    Infusions: . DOBUTamine 2.5 mcg/kg/min (03/19/16 2000)    PRN Medications: sodium chloride, acetaminophen, diclofenac sodium, fluticasone, ipratropium-albuterol, ondansetron (ZOFRAN) IV, sodium chloride flush   Assessment/Plan   1. Acute on chronic systolic HF with probable low output   NICM EF 20% 2. PAF 3. Acute respiratory failure with possible RLL infiltrate with hypoxia  4. Mechanical AVR 5. Hupokalemia 6. DNR 7. NSVT 8. Medtronic ICD- Deactivated.    Stop dobutamine today. Volume status improving. Continue IV lasix today. Tomorrow transition to lasix 80 mg daily. Weight coming down. Renal function stable.   Remain on antibiotics for CAP. Levofloxacin. Remains on 2 liters oxygen. Will need oxygen when discharged.   ICD has been turned off.  Family met with Palliative Care. Plans for Hospice once discharged.   Anticipate D/C in am.   Length of Stay: 4  Amy Clegg, NP  03/20/2016, 8:42 AM  Advanced Heart Failure Team Pager [phone] (M-F; 7a - 4p)  Please contact [facility] for night-coverage after hours (4p -7a ) and weekends on [email or web address]  Patient  seen and examined with Amy Clegg, NP. We discussed all aspects of the encounter. I agree with the assessment and plan as stated above.   He is improved today. Cough better. Volume status improved. BP stable. Will stop dobutamine. Continue IV lasix one more day.   Hospice input appreciated. Will plan home tomorrow with Hospice support. Family inquiring if HHPT would also be available. We will check.   Discussed with his wife and son as well.   Bensimhon, Daniel,MD 9:18 AM           

## 2016-03-20 NOTE — Progress Notes (Signed)
Daily Progress Note   Patient Name: Jon Butler       Date: 03/20/2016 DOB: 02/16/1935  Age: 81 y.o. MRN#: [id number] Attending Physician: Jonetta Osgood, MD Primary Care Physician: [name], MD Admit Date: 03/15/2016  Reason for Consultation/Follow-up: Psychosocial/spiritual support and Terminal Care  Subjective: Jon Butler remains comfortable and eager for discharge home. He has no acute complaints. His daughter-in-law is supportive at his bedside.   Length of Stay: 4  Current Medications: Scheduled Meds:  . allopurinol  300 mg Oral Daily  . amiodarone  400 mg Oral Daily  . aspirin EC  81 mg Oral Daily  . donepezil  23 mg Oral QHS  . furosemide  80 mg Intravenous BID  . [START ON 03/21/2016] furosemide  80 mg Oral Daily  . guaiFENesin  1,200 mg Oral BID  . LORazepam  0.5 mg Oral QHS  . memantine  10 mg Oral BID  . rosuvastatin  5 mg Oral q1800  . senna-docusate  2 tablet Oral QHS  . sodium chloride flush  3 mL Intravenous Q12H  . spironolactone  12.5 mg Oral Daily  . thiamine  100 mg Oral BID  . traZODone  400 mg Oral QHS  . warfarin  3 mg Oral ONCE-1800  . Warfarin - Pharmacist Dosing Inpatient   Does not apply q1800    Continuous Infusions:   PRN Meds: sodium chloride, acetaminophen, diclofenac sodium, fluticasone, ipratropium-albuterol, ondansetron (ZOFRAN) IV, sodium chloride flush  Physical Exam  Constitutional: No distress.  HENT:  Head: Normocephalic and atraumatic.  Mouth/Throat: Oropharynx is clear and moist. No oropharyngeal exudate.  Eyes: EOM are normal.  Neck: Normal range of motion.  Cardiovascular: Normal rate.   Pulmonary/Chest: Effort normal. No respiratory distress. He has no wheezes.  Abdominal: Soft.  Musculoskeletal: He exhibits edema (1-2+ BLE).  Marked generalized weakness    Neurological: He is alert.  Fully oriented.   Skin: Skin is warm and dry. There is pallor.  Psychiatric: Judgment and thought content normal. His speech is delayed. He is slowed. He exhibits abnormal recent memory.  Flat affect           Vital Signs: BP (!) 89/61 (BP Location: Left Arm)   Pulse (!) 51   Temp 98 F (36.7 C) (Oral)   Resp 12   Ht _0  (1.753 m)   Wt 74.7 kg (164 lb 9.6 oz)   SpO2 98%   BMI 24.31 kg/m  SpO2: SpO2: 98 % O2 Device: O2 Device: Nasal Cannula O2 Flow Rate: O2 Flow Rate (L/min): 1 L/min  Intake/output summary:  Intake/Output Summary (Last 24 hours) at 03/20/16 1341 Last data filed at 03/20/16 1227  Gross per 24 hour  Intake              550 ml  Output             2700 ml  Net            -2150 ml   LBM: Last BM Date: 03/20/16 Baseline Weight: Weight: 74.8 kg (165 lb) Most recent weight: Weight: 74.7 kg (164 lb 9.6 oz)  Palliative Assessment/Data: PPS 40-50%   Flowsheet Rows  Most Recent Value  Intake Tab  Referral Department  Cardiology  Unit at Time of Referral  Cardiac/Telemetry Unit  Palliative Care Primary Diagnosis  Cardiac  Date Notified  03/18/16  Palliative Care Type  Return patient Palliative Care  Reason for referral  Clarify Goals of Care  Date of Admission  03/15/16  # of days IP prior to Palliative referral  3  Clinical Assessment  Psychosocial & Spiritual Assessment  Palliative Care Outcomes      Patient Active Problem List   Diagnosis Date Noted  . Goals of care, counseling/discussion   . Palliative care by specialist   . Transient global amnesia 09/04/2015  . Pneumonia 04/04/2015  . Acute on chronic combined systolic and diastolic heart failure ([place]) 04/04/2015  . Dementia without behavioral disturbance 04/04/2015  . Moderate dementia without behavioral disturbance 02/27/2015  . Hypoxia   . Pressure ulcer 11/23/2014  . Urinary tract infection in elderly patient 11/22/2014  . UTI (urinary tract infection)  11/22/2014  . Lung nodule 11/22/2014  . Achalasia, esophageal 08/19/2014  . History of mechanical aortic valve replacement   . Palliative care encounter   . Cognitive impairment   . Dysphagia   . Alcohol use 08/09/2014  . Community acquired pneumonia 08/08/2014  . Sepsis ([place]) 08/08/2014  . Encounter for therapeutic drug monitoring 01/28/2013  . Long term current use of anticoagulant therapy 10/29/2012  . Acute on chronic systolic heart failure ([place]) 10/21/2012  . Acute renal failure ([place]) 10/21/2012  . Acute respiratory failure with hypoxia ([place]) 10/15/2012  . Pulmonary edema 10/15/2012  . Cardiac asthma ([place]) 10/15/2012  . NICM (nonischemic cardiomyopathy) ([place]) 10/15/2012  . Poor venous access 10/15/2012  . Atrial fibrillation ([place]) 10/13/2012  . Chronic systolic heart failure ([place]) 11/06/2010  . Biventricular implantable cardioverter-defibrillator in situ 11/06/2010  . Ventricular tachycardia ([place]) 11/06/2010    Palliative Care Assessment & Plan   HPI: 81 y.o. male  with past medical history of chronic systolic HF EF 01% due to NICM s/p CRT-D (Medtronic), mechanical AVR (St Jude), chronic coumadin therapy,LBBB, PAF, ETOH abuse,dementia and h/o non-compliance. He presented to the ED with SOB and was subsequently admitted on 03/15/2016 with acute on chronic respiratory failure secondary to decompensated sCHF and acute bronchitis. Heart failure team has been following; pt on dobutamine and diuresis ongoing. Pt does not want inotropes at home and ICD had already been turned off during a prior admission. Pt is extremely debilitated. Palliative consulted to assist family in goals of care and aid in disposition planning.  Assessment: On 3/14 I met with Jon Butler, his wife, and one of his sons. We discussed different support paths at home, which included home health versus home with Hospice. We explored the goals of each, and the services/support available. After consideration, they  elected to go home with Hospice support.  I stopped by today per the family's request yesterday. Jon Butler remains comfortable and eager for discharge home once medically optimized (hopefully tomorrow, per HF team's note). [facility] has already met with [name], and is coordinating home DME needs. I briefly discussed [facility] services versus Hospice with the patient's daughter-in-law at the bedside. Specifically, I clarified that their services could be utilized if the plan changes from Hospice (they could not use both concurrently).   Recommendations/Plan:  DNR, discharge home with hospice once medically optimized  Code Status:  DNR  Prognosis:   < 6 months  Discharge Planning:  Home with Hospice  Care plan was  discussed with pt and pt's daughter-in-law  Thank you for allowing the Palliative Medicine Team to assist in the care of this patient.  Total time: 15 minutes    Greater than 50%  of this time was spent counseling and coordinating care related to the above assessment and plan.  Charlynn Court, NP Palliative Medicine Team [phone] pager (7a-5p) Team Phone # [phone]

## 2016-03-20 NOTE — Consult Note (Signed)
   [facility] CM Inpatient Consult   03/20/2016  Maciah Schweigert [facility] 31-Mar-1935 [id number]   Referral received for post hospital support follow up.  However, the patient and family has consented to be followed by [facility] in which the patient will get full post hospital follow up and care management.  Met with the patient and daughter in law, Nanette, regarding [facility] services.  His wife, [name] was not availalble at this time per daughter in law. A brochure, magnet and contact information was given.  This is the patient's first hospitalization in the past 6 months.  Chart reviewed and reveals 81 y.o. male with medical history significant of CHF with EF 25-30%, AICD / biventricular pacer, A.Fib, NICM, dementia.  Patient presents to the ED with family with AMS.  He has had SOB, cough productive of whiteish sputum, leg swelling (R>L but he always has this) worsening over the past 2 weeks.  He was admitted for acute on chronic hypoxic respiratory failure due to CHF and bronchitis per MD notes. Requested a contact from wife to [place], if she has questions or needs.  Please contact, if discharge plan changes from Hospice:  Natividad Brood, [facility] Liaison  [phone] business mobile phone Toll free office [phone]

## 2016-03-20 NOTE — Consult Note (Signed)
[facility] Met with spouse of 85 years, Jon Butler, and the patient, at the bedside, to discuss referral for hospice services in the home. Reviewed hospice philosophy and scope of services that are consistent with stated Goals of Care.The patient was dozing intermittently during the visit. Jon Butler verbalized understanding that the patient's disease is terminal and confirmed that no further hospitalizations are desired, Reviewed medical record and current clinical status with Dr. Brock Ra, who approved the patient for hospice services in the home. Discussed DME needs with Jon Butler. DME ordered from [facility] for delivery tomorrow morning: Fully electric bed with T5 mattress and full rails, OBT, WC, O2 Patient owns: walker, BSC, shower [facility] will plan to admit to services tomorrow, following discharge home. Thank you for this referral.  [name] RN [place] Cell: [phone]

## 2016-03-20 NOTE — Progress Notes (Signed)
PROGRESS NOTE        PATIENT DETAILS Name: Jon Butler Age: 81 y.o. Sex: male Date of Birth: 1935-07-20 Admit Date: 03/15/2016 Admitting Physician Etta Quill, DO FFM:[id number], MD  Brief Narrative: Patient is a 81 y.o. male history of chronic systolic HF EF 17% due to NICM s/p CRT-D (Medtronic), mechanical AVR (St Jude), chronic coumadin therapy,LBBB, PAF, ETOH abuse,dementia and h/o non-compliance presented to the ED on 3/10 with acute on chronic hypoxic respiratory failure due to decompensated systolic heart failure and acute bronchitis. He was evaluated by cardiology, started on diuretics and milrinone infusion, but due to ongoing issues with hypotension, and milrinone was discontinued and patient was subsequently started on a dobutamine infusion. Patient/Family do not desire inotropes at home. His ICD interrogation showed 2.5% ATAF burden.See below for further details  Subjective: Continues to have cough-but otherwise seems comfortable.  Assessment/Plan: Acute hypoxemic respiratory failure: Due to decompensated systolic heart failure and acute bronchitis. Improving with diuretics, empiric levofloxacin and other supportive measures. Will need to be evaluated for home oxygen requirement prior to discharge.  Acute on chronic systolic heart failure (EF by TTE on 03/18/1818-25%): Improved, -3.0 L so far. CHF team following and directing care. Remains on Lasix and Aldactone, and dobutamine stopped today.   Acute bronchitis: Slowly improving-still coughing, continue levofloxacin, as needed bronchodilators. Blood cultures 3/10 negative so far.  PAF: Continue amiodarone-on coumadin with therapeutic INR. CHADVASC of atleast 3.  Dementia: Appears mild. Awake and alert this morning, however remains at risk for delirium, minimize narcotics and benzodiazepines. Continue home medications along with supportive care.  Gout:continue allopurinol-no evidence of  flare  Dyslipidemia: Continue statin.  Palliative care:DNR in place. Both patient and family do not desire aggressive measures, per cardiology note, ICD has been deactivated. Palliative care consulted, with recommendations to discharge home with hospice care.  DVT Prophylaxis: Full dose anticoagulation with Coumadin  Code Status:  DNR  Family Communication: Spouse at bedside  Disposition Plan: Remain inpatient-await palliative care recommendations-Home with hospice vs HHRN/PT-likely on 3/16  Antimicrobial agents: Anti-infectives    Start     Dose/Rate Route Frequency Ordered Stop   03/16/16 1130  levofloxacin (LEVAQUIN) tablet 750 mg     750 mg Oral Daily 03/16/16 1112 03/20/16 0841      Procedures: Echo 3/12>> The estimated ejection fraction was in the range of 20% to 25%.  CONSULTS:  cardiology and Palliative care  Time spent: 25 minutes-Greater than 50% of this time was spent in counseling, explanation of diagnosis, planning of further management, and coordination of care.  MEDICATIONS: Scheduled Meds: . allopurinol  300 mg Oral Daily  . amiodarone  400 mg Oral Daily  . aspirin EC  81 mg Oral Daily  . donepezil  23 mg Oral QHS  . furosemide  80 mg Intravenous BID  . [START ON 03/21/2016] furosemide  80 mg Oral Daily  . guaiFENesin  1,200 mg Oral BID  . LORazepam  0.5 mg Oral QHS  . memantine  10 mg Oral BID  . rosuvastatin  5 mg Oral q1800  . senna-docusate  2 tablet Oral QHS  . sodium chloride flush  3 mL Intravenous Q12H  . spironolactone  12.5 mg Oral Daily  . thiamine  100 mg Oral BID  . traZODone  400 mg Oral QHS  . warfarin  3 mg Oral  ONCE-1800  . Warfarin - Pharmacist Dosing Inpatient   Does not apply q1800   Continuous Infusions:  PRN Meds:.sodium chloride, acetaminophen, diclofenac sodium, fluticasone, ipratropium-albuterol, ondansetron (ZOFRAN) IV, sodium chloride flush   PHYSICAL EXAM: Vital signs: Vitals:   03/20/16 0040 03/20/16 0452  03/20/16 0730 03/20/16 0759  BP:  101/68    Pulse:  (!) 51    Resp:  12    Temp: 97.8 F (36.6 C) 98.2 F (36.8 C)  98.2 F (36.8 C)  TempSrc: Axillary Axillary  Oral  SpO2:  98% 100%   Weight:  74.7 kg (164 lb 9.6 oz)    Height:       Filed Weights   03/18/16 0608 03/19/16 0417 03/20/16 0452  Weight: 77.2 kg (170 lb 1.6 oz) 78.2 kg (172 lb 4.8 oz) 74.7 kg (164 lb 9.6 oz)   Body mass index is 24.31 kg/m.   General appearance :Awake, alert, not in any distress.  Eyes:, pupils equally reactive to light and accomodation HEENT: Atraumatic and Normocephalic Neck: supple, no JVD. No cervical lymphadenopathy.  Resp:Good air entry bilaterally CVS: S1 S2 irregular,+ syst murmur GI: Bowel sounds present, Non tender and not distended with no gaurding, rigidity or rebound. Extremities: B/L Lower Ext shows + edema, both legs are warm to touch Neurology:  speech clear,Non focal, sensation is grossly intact. Musculoskeletal:No digital cyanosis Skin:No Rash, warm and dry Wounds:N/A  I have personally reviewed following labs and imaging studies  LABORATORY DATA: CBC:  Recent Labs Lab 03/15/16 2136 03/19/16 0413 03/20/16 0251  WBC 9.3 6.0 6.0  NEUTROABS 6.8  --   --   HGB 13.2 12.3* 12.4*  HCT 41.5 38.5* 38.0*  MCV 96.7 94.1 95.0  PLT 130* 118* 118*    Basic Metabolic Panel:  Recent Labs Lab 03/16/16 0324 03/17/16 0535 03/18/16 0250 03/19/16 0413 03/20/16 0251  NA 141 137 137 137 136  K 3.3* 4.1 3.9 4.0 3.8  CL 104 100* 100* 97* 96*  CO2 30 31 30 30  34*  GLUCOSE 131* 91 75 91 90  BUN 26* 24* 17 18 17   CREATININE 0.92 0.93 0.77 0.90 1.00  CALCIUM 8.0* 8.0* 8.4* 8.5* 8.3*  MG  --  2.0 2.0  --   --     GFR: Estimated Creatinine Clearance: 60.9 mL/min (by C-G formula based on SCr of 1 mg/dL).  Liver Function Tests:  Recent Labs Lab 03/15/16 2136  AST 20  ALT 15*  ALKPHOS 93  BILITOT 1.3*  PROT 6.3*  ALBUMIN 3.5   No results for input(s): LIPASE, AMYLASE  in the last 168 hours. No results for input(s): AMMONIA in the last 168 hours.  Coagulation Profile:  Recent Labs Lab 03/16/16 0324 03/17/16 0535 03/18/16 0648 03/19/16 0413 03/20/16 0251  INR 2.75 3.70 3.25 2.74 2.59    Cardiac Enzymes:  Recent Labs Lab 03/15/16 2230 03/16/16 0324 03/16/16 0852 03/16/16 1409  TROPONINI 0.06* 0.06* 0.07* 0.06*    BNP (last 3 results) No results for input(s): PROBNP in the last 8760 hours.  HbA1C: No results for input(s): HGBA1C in the last 72 hours.  CBG: No results for input(s): GLUCAP in the last 168 hours.  Lipid Profile: No results for input(s): CHOL, HDL, LDLCALC, TRIG, CHOLHDL, LDLDIRECT in the last 72 hours.  Thyroid Function Tests: No results for input(s): TSH, T4TOTAL, FREET4, T3FREE, THYROIDAB in the last 72 hours.  Anemia Panel: No results for input(s): VITAMINB12, FOLATE, FERRITIN, TIBC, IRON, RETICCTPCT in the last 72 hours.  Urine analysis:    Component Value Date/Time   COLORURINE YELLOW 03/16/2016 [place] 03/16/2016 0843   LABSPEC 1.011 03/16/2016 0843   PHURINE 5.0 03/16/2016 0843   GLUCOSEU NEGATIVE 03/16/2016 0843   HGBUR NEGATIVE 03/16/2016 0843   BILIRUBINUR NEGATIVE 03/16/2016 0843   [place] 03/16/2016 0843   PROTEINUR NEGATIVE 03/16/2016 0843   UROBILINOGEN 1.0 08/08/2014 2229   NITRITE NEGATIVE 03/16/2016 0843   LEUKOCYTESUR SMALL (A) 03/16/2016 0843    Sepsis Labs: Lactic Acid, Venous    Component Value Date/Time   LATICACIDVEN 1.55 03/15/2016 2150    MICROBIOLOGY: Recent Results (from the past 240 hour(s))  Culture, blood (Routine x 2)     Status: None (Preliminary result)   Collection Time: 03/15/16  9:36 PM  Result Value Ref Range Status   Specimen Description BLOOD RIGHT ANTECUBITAL  Final   Special Requests BOTTLES DRAWN AEROBIC AND ANAEROBIC 5CC EA  Final   Culture NO GROWTH 4 DAYS  Final   Report Status PENDING  Incomplete  Culture, blood (Routine  x 2)     Status: None (Preliminary result)   Collection Time: 03/15/16 10:20 PM  Result Value Ref Range Status   Specimen Description BLOOD LEFT ANTECUBITAL  Final   Special Requests BOTTLES DRAWN AEROBIC AND ANAEROBIC 5CC EA  Final   Culture NO GROWTH 4 DAYS  Final   Report Status PENDING  Incomplete    RADIOLOGY STUDIES/RESULTS: Dg Chest 2 View  Result Date: 03/15/2016 CLINICAL DATA:  Cough and rhonchi EXAM: CHEST  2 VIEW COMPARISON:  05/09/2015 FINDINGS: Moderate cardiomegaly.  The transvenous cardiac leads appear intact. Moderate vascular and interstitial prominence. Hazy central lung opacities bilaterally. Small right pleural effusion. IMPRESSION: The findings likely represent congestive heart failure with interstitial and alveolar edema, as well a small right pleural effusion. Electronically Signed   By: Andreas Newport M.D.   On: 03/15/2016 22:21     LOS: 4 days   Oren Binet, MD  [facility] Pager:[phone]  If 7PM-7AM, please contact night-coverage [email or web address] Password [email or web address] 03/20/2016, 11:13 AM

## 2016-03-20 NOTE — Care Management Important Message (Signed)
Important Message  Patient Details  Name: Jon Butler MRN: [id number] Date of Birth: Jun 30, 1935   Medicare Important Message Given:  Yes    Nathen May 03/20/2016, 12:56 PM

## 2016-03-20 NOTE — Progress Notes (Signed)
Physical Therapy Treatment Patient Details Name: Jon Butler MRN: [id number] DOB: December 29, 1935 Today's Date: 03/20/2016    History of Present Illness 81 y.o. male with medical history significant of CHF with EF 25-30%, AICD / biventricular pacer, A.Fib, NICM, dementia.  Patient presents to the ED with family with AMS.  He has had SOB, cough productive of whiteish sputum, leg swelling (R>L but he always has this) worsening over the past 2 weeks.  He was admitted for acute on chronic hypoxic respiratory failure due to CHF and bronchitis.    PT Comments    Pt mobilizing slowly during PT session but able to increase ambulation distance to 40 ft with rw and min assistance. Current plan is to D/C to home with hospice care. Spouse present during session and denies any questions. PT to continue to follow.     Follow Up Recommendations  Home health PT;Supervision/Assistance - 24 hour     Equipment Recommendations  None recommended by PT    Recommendations for Other Services       Precautions / Restrictions Precautions Precautions: Fall Restrictions Weight Bearing Restrictions: No    Mobility  Bed Mobility Overal bed mobility: Needs Assistance Bed Mobility: Supine to Sit     Supine to sit: Mod assist     General bed mobility comments: cues for sequence  Transfers Overall transfer level: Needs assistance Equipment used: Rolling walker (2 wheeled) Transfers: Sit to/from Stand Sit to Stand: Mod assist         General transfer comment: repeated cues for task and hand placement  Ambulation/Gait Ambulation/Gait assistance: Min assist Ambulation Distance (Feet): 40 Feet Assistive device: Rolling walker (2 wheeled) Gait Pattern/deviations: Step-through pattern;Decreased stride length;Trunk flexed Gait velocity: decreased   General Gait Details: Repeated cues needed for sequence, verbally needing cues to take steps intermittently. Using 1L O2 and sats staying in 90s  throughout.    Stairs            Wheelchair Mobility    Modified Rankin (Stroke Patients Only)       Balance Overall balance assessment: Needs assistance Sitting-balance support: Bilateral upper extremity supported;Feet supported Sitting balance-Leahy Scale: Fair Sitting balance - Comments: able to sit EOB with assistance   Standing balance support: Bilateral upper extremity supported Standing balance-Leahy Scale: Poor Standing balance comment: requiring rw and physical assist.                     Cognition Arousal/Alertness: Awake/alert Behavior During Therapy: Flat affect Overall Cognitive Status: History of cognitive impairments - at baseline                 General Comments: Repated cues for task and safety needed.     Exercises      General Comments        Pertinent Vitals/Pain Pain Assessment: Faces Faces Pain Scale: Hurts little more Pain Location: Rt upper trap region Pain Descriptors / Indicators:  (hurts) Pain Intervention(s): Limited activity within patient's tolerance;Monitored during session    Home Living                      Prior Function            PT Goals (current goals can now be found in the care plan section) Acute Rehab PT Goals Patient Stated Goal: not expressed PT Goal Formulation: With patient Time For Goal Achievement: 03/30/16 Potential to Achieve Goals: Good Progress towards PT goals: Progressing toward goals  Frequency    Min 3X/week      PT Plan Current plan remains appropriate    Co-evaluation             End of Session Equipment Utilized During Treatment: Gait belt;Oxygen Activity Tolerance: Patient limited by fatigue Patient left: in chair;with call bell/phone within reach;with chair alarm set;with family/visitor present Nurse Communication: Mobility status PT Visit Diagnosis: Muscle weakness (generalized) (M62.81);Unsteadiness on feet (R26.81)     Time: 3976-7341 PT  Time Calculation (min) (ACUTE ONLY): 28 min  Charges:  $Gait Training: 8-22 mins $Therapeutic Activity: 8-22 mins                    G Codes:       Cassell Clement, PT, CSCS Pager [phone] Office [phone] 03/20/2016, 12:00 PM

## 2016-03-20 NOTE — Progress Notes (Signed)
ANTICOAGULATION CONSULT NOTE - Follow Up Consult  Pharmacy Consult for Coumadin Indication: atrial fibrillation  No Known Allergies  Patient Measurements: Height: 5\' 9"  (175.3 cm) Weight: 164 lb 9.6 oz (74.7 kg) IBW/kg (Calculated) : 70.7  Vital Signs: Temp: 98.2 F (36.8 C) (03/15 0759) Temp Source: Oral (03/15 0759) BP: 101/68 (03/15 0452) Pulse Rate: 51 (03/15 0452)  Labs:  Recent Labs  03/18/16 0250 03/18/16 0648 03/19/16 0413 03/20/16 0251  HGB  --   --  12.3* 12.4*  HCT  --   --  38.5* 38.0*  PLT  --   --  118* 118*  LABPROT  --  33.9* 29.6* 28.3*  INR  --  3.25 2.74 2.59  CREATININE 0.77  --  0.90 1.00    Estimated Creatinine Clearance: 60.9 mL/min (by C-G formula based on SCr of 1 mg/dL).  Assessment: 80yom on coumadin for afib. INR 2.58 on admit and he received a dose. INR then trended up to 3.7, doses held 3/12 and 3/13, resumed yesterday. Today's INR is therapeutic at 2.59. Jump in INR could have been due to drug interaction with levaquin (3/11 > 3/15) or increased amiodarone dose (200mg  daily pta, 400mg  daily here). CBC stable. No bleeding.  Home dose: 2mg  daily except 3mg  MWF - last taken 3/10  Goal of Therapy:  INR 2-3  Monitor platelets by anticoagulatino protocol: Yes   Plan:  1) Coumadin 3mg  x 1 again  2) Daily INR  Nena Jordan, PharmD, BCPS 03/20/2016 9:52 AM

## 2016-03-21 ENCOUNTER — Encounter: Payer: Self-pay | Admitting: Cardiology

## 2016-03-21 DIAGNOSIS — Z7189 Other specified counseling: Secondary | ICD-10-CM

## 2016-03-21 LAB — BASIC METABOLIC PANEL
Anion gap: 7 (ref 5–15)
BUN: 19 mg/dL (ref 6–20)
CO2: 34 mmol/L — ABNORMAL HIGH (ref 22–32)
Calcium: 8.4 mg/dL — ABNORMAL LOW (ref 8.9–10.3)
Chloride: 95 mmol/L — ABNORMAL LOW (ref 101–111)
Creatinine, Ser: 0.86 mg/dL (ref 0.61–1.24)
Glucose, Bld: 84 mg/dL (ref 65–99)
Potassium: 3.5 mmol/L (ref 3.5–5.1)
SODIUM: 136 mmol/L (ref 135–145)

## 2016-03-21 LAB — PROTIME-INR
INR: 2.27
Prothrombin Time: 25.4 seconds — ABNORMAL HIGH (ref 11.4–15.2)

## 2016-03-21 MED ORDER — WARFARIN SODIUM 4 MG PO TABS
4.0000 mg | ORAL_TABLET | Freq: Once | ORAL | Status: DC
  Filled 2016-03-21: qty 1

## 2016-03-21 MED ORDER — ROSUVASTATIN CALCIUM 10 MG PO TABS: 5.0000 mg | ORAL_TABLET | Freq: Every day | ORAL | 0 refills | Status: AC

## 2016-03-21 MED ORDER — IPRATROPIUM-ALBUTEROL 0.5-2.5 (3) MG/3ML IN SOLN: 3.0000 mL | RESPIRATORY_TRACT | 0 refills | Status: AC | PRN

## 2016-03-21 MED ORDER — LORAZEPAM 0.5 MG PO TABS: 0.5000 mg | ORAL_TABLET | Freq: Every day | ORAL | 0 refills | Status: AC

## 2016-03-21 MED ORDER — VITAMIN B-1 100 MG PO TABS: 100.0000 mg | ORAL_TABLET | Freq: Two times a day (BID) | ORAL | 0 refills | Status: AC

## 2016-03-21 MED ORDER — SPIRONOLACTONE 25 MG PO TABS: 25.0000 mg | ORAL_TABLET | Freq: Every day | ORAL | 0 refills | Status: AC

## 2016-03-21 MED ORDER — FUROSEMIDE 80 MG PO TABS: 80.0000 mg | ORAL_TABLET | Freq: Every day | ORAL | 0 refills | Status: AC

## 2016-03-21 MED ORDER — ALLOPURINOL 300 MG PO TABS: 300.0000 mg | ORAL_TABLET | Freq: Every day | ORAL | 0 refills | Status: AC

## 2016-03-21 MED ORDER — TRAZODONE HCL 100 MG PO TABS: 400.0000 mg | ORAL_TABLET | Freq: Every day | ORAL | 0 refills | Status: AC

## 2016-03-21 MED ORDER — AMIODARONE HCL 400 MG PO TABS: 400.0000 mg | ORAL_TABLET | Freq: Every day | ORAL | 0 refills | Status: AC

## 2016-03-21 MED ORDER — WARFARIN SODIUM 2 MG PO TABS: 2.0000 mg | ORAL_TABLET | ORAL | 0 refills | Status: DC

## 2016-03-21 MED ORDER — DONEPEZIL HCL 23 MG PO TABS: 23.0000 mg | ORAL_TABLET | Freq: Every day | ORAL | 0 refills | Status: AC

## 2016-03-21 MED ORDER — MEMANTINE HCL 10 MG PO TABS: 10.0000 mg | ORAL_TABLET | Freq: Two times a day (BID) | ORAL | 0 refills | Status: AC

## 2016-03-21 MED ORDER — GUAIFENESIN ER 600 MG PO TB12: 1200.0000 mg | ORAL_TABLET | Freq: Two times a day (BID) | ORAL | 0 refills | Status: AC

## 2016-03-21 MED ORDER — FLUTICASONE PROPIONATE 50 MCG/ACT NA SUSP: 2.0000 | Freq: Every day | NASAL | 2 refills | Status: AC | PRN

## 2016-03-21 NOTE — Progress Notes (Signed)
Daily Progress Note   Patient Name: Jon Butler       Date: 03/21/2016 DOB: 1935/04/23  Age: 81 y.o. MRN#: [id number] Attending Physician: Jonetta Osgood, MD Primary Care Physician: [name], MD Admit Date: 03/15/2016  Reason for Consultation/Follow-up: Psychosocial/spiritual support and Terminal Care  Subjective: Jon Butler remains comfortable and eager for discharge home. He has no acute complaints. His wife is at the bedside and is comfortable with the discharge plan.   Length of Stay: 5  Current Medications: Scheduled Meds:  . allopurinol  300 mg Oral Daily  . amiodarone  400 mg Oral Daily  . aspirin EC  81 mg Oral Daily  . donepezil  23 mg Oral QHS  . furosemide  80 mg Oral Daily  . guaiFENesin  1,200 mg Oral BID  . LORazepam  0.5 mg Oral QHS  . memantine  10 mg Oral BID  . rosuvastatin  5 mg Oral q1800  . senna-docusate  2 tablet Oral QHS  . sodium chloride flush  3 mL Intravenous Q12H  . spironolactone  12.5 mg Oral Daily  . thiamine  100 mg Oral BID  . traZODone  400 mg Oral QHS  . warfarin  4 mg Oral ONCE-1800  . Warfarin - Pharmacist Dosing Inpatient   Does not apply q1800    Continuous Infusions:   PRN Meds: sodium chloride, acetaminophen, diclofenac sodium, fluticasone, ipratropium-albuterol, ondansetron (ZOFRAN) IV, sodium chloride flush  Physical Exam  Constitutional: No distress.  HENT:  Head: Normocephalic and atraumatic.  Mouth/Throat: Oropharynx is clear and moist. No oropharyngeal exudate.  Eyes: EOM are normal.  Neck: Normal range of motion.  Cardiovascular: Bradycardia per monitor-pt asymptomatic Pulmonary/Chest: Effort normal. No respiratory distress. He has no wheezes.  Musculoskeletal: He exhibits edema (1+ BLE).  Marked generalized weakness  Neurological: He is alert.  Fully  oriented.  Skin: Skin is warm and dry. There is pallor.  Psychiatric: Judgment and thought content normal. His speech is delayed. He is slowed. He exhibits abnormal recent memory.  Flat affect           Vital Signs: BP (!) 87/65 (BP Location: Left Arm)   Pulse (!) 56   Temp 98.2 F (36.8 C) (Oral)   Resp 14   Ht _0  (1.753 m)   Wt 73.1 kg (161 lb 3.2 oz)   SpO2 92%   BMI 23.81 kg/m  SpO2: SpO2: 92 % O2 Device: O2 Device: Nasal Cannula O2 Flow Rate: O2 Flow Rate (L/min): 1 L/min  Intake/output summary:   Intake/Output Summary (Last 24 hours) at 03/21/16 1145 Last data filed at 03/21/16 0839  Gross per 24 hour  Intake              960 ml  Output             2250 ml  Net            -1290 ml   LBM: Last BM Date: 03/20/16 Baseline Weight: Weight: 74.8 kg (165 lb) Most recent weight: Weight: 73.1 kg (161 lb 3.2 oz)  Palliative Assessment/Data: PPS 40-50%   Flowsheet Rows     Most Recent Value  Intake Tab  Referral Department  Cardiology  Unit at Time of Referral  Cardiac/Telemetry Unit  Palliative Care Primary Diagnosis  Cardiac  Date Notified  03/18/16  Palliative Care Type  Return patient Palliative Care  Reason for referral  Clarify Goals of Care  Date of Admission  03/15/16  # of days IP prior to Palliative referral  3  Clinical Assessment  Psychosocial & Spiritual Assessment  Palliative Care Outcomes      Patient Active Problem List   Diagnosis Date Noted  . Goals of care, counseling/discussion   . Palliative care by specialist   . Transient global amnesia 09/04/2015  . Pneumonia 04/04/2015  . Acute on chronic combined systolic and diastolic heart failure ([place]) 04/04/2015  . Dementia without behavioral disturbance 04/04/2015  . Moderate dementia without behavioral disturbance 02/27/2015  . Hypoxia   . Pressure ulcer 11/23/2014  . Urinary tract infection in elderly patient 11/22/2014  . UTI (urinary tract infection) 11/22/2014  . Lung nodule  11/22/2014  . Achalasia, esophageal 08/19/2014  . History of mechanical aortic valve replacement   . Palliative care encounter   . Cognitive impairment   . Dysphagia   . Alcohol use 08/09/2014  . Community acquired pneumonia 08/08/2014  . Sepsis ([place]) 08/08/2014  . Encounter for therapeutic drug monitoring 01/28/2013  . Long term current use of anticoagulant therapy 10/29/2012  . Acute on chronic systolic heart failure ([place]) 10/21/2012  . Acute renal failure ([place]) 10/21/2012  . Acute respiratory failure with hypoxia ([place]) 10/15/2012  . Pulmonary edema 10/15/2012  . Cardiac asthma ([place]) 10/15/2012  . NICM (nonischemic cardiomyopathy) ([place]) 10/15/2012  . Poor venous access 10/15/2012  . Atrial fibrillation ([place]) 10/13/2012  . Chronic systolic heart failure ([place]) 11/06/2010  . Biventricular implantable cardioverter-defibrillator in situ 11/06/2010  . Ventricular tachycardia ([place]) 11/06/2010    Palliative Care Assessment & Plan   HPI: 81 y.o. male  with past medical history of chronic systolic HF EF 46% due to NICM s/p CRT-D ([employer]), mechanical AVR ([employer]), chronic coumadin therapy,LBBB, PAF, ETOH abuse,dementia and h/o non-compliance. He presented to the ED with SOB and was subsequently admitted on 03/15/2016 with acute on chronic respiratory failure secondary to decompensated sCHF and acute bronchitis. Heart failure team has been following; pt on dobutamine and diuresis ongoing. Pt does not want inotropes at home and ICD had already been turned off during a prior admission. Pt is extremely debilitated. Palliative consulted to assist family in goals of care and aid in disposition planning.  Assessment: On 3/14 I met with Jon Butler, his wife, and one of his sons. We discussed different support paths at home, which included home health versus home with Hospice. We explored the goals of each, and the services/support available. After consideration, they elected to go home with  Hospice support.  I followed up today to ensure both Jon Butler and his wife remained comfortable with the discharge plan. Jon Butler is eager for discharge and has no symptom complaints. His wife is clearly a bit anxious about going home. I reinforced the support Hospice will provide, and their 24 hour contact number (which she had). I also reminded her that Mr. Dillenbeck could continue to follow with the HF team for medication management, which seemed to help alleviate some of her anxiety.    Recommendations/Plan:  DNR, discharge home with hospice (expect today)  Confirmed [place] DNR form was given to pt's wife, per her request  Code Status:  DNR  Prognosis:   < 6 months  Discharge Planning:  Home with Hospice  Care plan was discussed with pt and pt's daughter-in-law  Thank you for allowing the Palliative Medicine Team to assist in the care of this patient.  Total time: 15 minutes    Greater than 50%  of this time was spent counseling and coordinating care related to the above assessment and plan.  Charlynn Court, NP Palliative Medicine Team [phone] pager (7a-5p) Team Phone # [phone]

## 2016-03-21 NOTE — Progress Notes (Signed)
Letter  

## 2016-03-21 NOTE — Discharge Summary (Addendum)
PATIENT DETAILS Name: Jon Butler Age: 81 y.o. Sex: male Date of Birth: [birth year] MRN: [id number]. Admitting Physician: Jared M Gardner, DO FTD:DUKGUR,KYHCWC, MD  Admit Date: 03/15/2016 Discharge date: 03/21/2016  Recommendations for Outpatient Follow-up:  1. Follow up with PCP in 1-2 weeks 2. Please obtain BMP/CBC/INR at next visit with PCP or with cardiology. 3. Being discharged with hospice care at home.   Admitted From:  Home  Disposition: Home with home health services   Home Health: No  Equipment/Devices: None  Discharge Condition: Stable  CODE STATUS: DNR  Diet recommendation:  Heart Healthy   Brief Summary: See H&P, Labs, Consult and Test reports for all details in brief, Patient is a 81 y.o. male history of chronic systolic HF EF 37% due to NICM s/p CRT-D (Medtronic), mechanical AVR (St Jude), chronic coumadin therapy,LBBB, PAF, ETOH abuse,dementia and h/o non-compliance presented to the ED on 3/10 with acute on chronic hypoxic respiratory failure due to decompensated systolic heart failure and acute bronchitis. He was evaluated by cardiology, started on diuretics and milrinone infusion, but due to ongoing issues with hypotension, and milrinone was discontinued and patient was subsequently started on a dobutamine infusion. Patient/Family do not desire inotropes at home. After extensive discussion with family, patient being discharged home with hospice care.  Brief Hospital Course: Acute hypoxemic respiratory failure: Due to decompensated systolic heart failure and acute bronchitis. Improving with diuretics, empiric levofloxacin and other supportive measures. Continues to be very frail, suspect will require home oxygen discharge-at bedtime been ordered.  Acute on chronic systolic heart failure (EF by TTE on 03/18/1818-25%): Improved, -3.0 L so far. CHF team followed closely. Recommendations are to resume Lasix and Aldactone on discharge. He briefly required  dobutamine during this hospital stay. As noted above, family and the patient not keen on going home with inotropes.  Acute bronchitis: Slowly improving-off is much better, has completed a course of empiric levofloxacin and bronchodilators. Blood cultures 3/10 negative so far.  PAF: Continue amiodarone-on coumadin with therapeutic INR. CHADVASC of atleast 3. Will require outpatient INR monitoring for now, and if he deteriorates more in the future-consider stopping Coumadin permanently.  Dementia: Appears mild. Awake and alert this morning, however remains at risk for delirium, minimize narcotics and benzodiazepines. Continue home medications along with supportive care.  Gout:continue allopurinol-no evidence of flare  Dyslipidemia: Continue statin.  Palliative care:DNR in place. Both patient and family do not desire aggressive measures, per cardiology note, ICD has been deactivated. Palliative care consulted, with recommendations to discharge home with hospice care.  Procedures/Studies: Echo 3/12>>estimated ejection fraction was in the range of 20% to 25%.  Discharge Diagnoses:  Principal Problem:   Acute on chronic combined systolic and diastolic heart failure (HCC) Active Problems:   Biventricular implantable cardioverter-defibrillator in situ   Atrial fibrillation (HCC)   NICM (nonischemic cardiomyopathy) (HCC)   Moderate dementia without behavioral disturbance   Goals of care, counseling/discussion   Palliative care by specialist   Discharge Instructions:  Activity:  As tolerated with Full fall precautions use walker/cane & assistance as needed   Discharge Instructions    (HEART FAILURE PATIENTS) Call MD:  Anytime you have any of the following symptoms: 1) 3 pound weight gain in 24 hours or 5 pounds in 1 week 2) shortness of breath, with or without a dry hacking cough 3) swelling in the hands, feet or stomach 4) if you have to sleep on extra pillows at night in order to  breathe.    Complete  by:  As directed    Diet - low sodium heart healthy    Complete by:  As directed    Increase activity slowly    Complete by:  As directed      Allergies as of 03/21/2016   No Known Allergies     Medication List    STOP taking these medications   amoxicillin 500 MG capsule Commonly known as:  AMOXIL   aspirin EC 81 MG tablet   carvedilol 3.125 MG tablet Commonly known as:  COREG   colchicine 0.6 MG tablet   sacubitril-valsartan 24-26 MG Commonly known as:  ENTRESTO     TAKE these medications   allopurinol 300 MG tablet Commonly known as:  ZYLOPRIM Take 1 tablet (300 mg total) by mouth daily.   amiodarone 400 MG tablet Commonly known as:  PACERONE Take 1 tablet (400 mg total) by mouth daily. What changed:  medication strength  how much to take   donepezil 23 MG Tabs tablet Commonly known as:  ARICEPT Take 1 tablet (23 mg total) by mouth at bedtime. What changed:  when to take this   fluticasone 50 MCG/ACT nasal spray Commonly known as:  FLONASE Place 2 sprays into both nostrils daily as needed for allergies or rhinitis. Reported on 12/21/2014   furosemide 80 MG tablet Commonly known as:  LASIX Take 1 tablet (80 mg total) by mouth daily. What changed:  medication strength  how much to take  additional instructions   guaiFENesin 600 MG 12 hr tablet Commonly known as:  MUCINEX Take 2 tablets (1,200 mg total) by mouth 2 (two) times daily.   ipratropium-albuterol 0.5-2.5 (3) MG/3ML Soln Commonly known as:  DUONEB Take 3 mLs by nebulization every 4 (four) hours as needed.   LORazepam 0.5 MG tablet Commonly known as:  ATIVAN Take 1 tablet (0.5 mg total) by mouth at bedtime.   memantine 10 MG tablet Commonly known as:  NAMENDA Take 1 tablet (10 mg total) by mouth 2 (two) times daily.   rosuvastatin 10 MG tablet Commonly known as:  CRESTOR Take 0.5 tablets (5 mg total) by mouth daily. Reported on 03/22/2015   spironolactone 25  MG tablet Commonly known as:  ALDACTONE Take 1 tablet (25 mg total) by mouth daily. What changed:  how much to take   thiamine 100 MG tablet Commonly known as:  VITAMIN B-1 Take 1 tablet (100 mg total) by mouth 2 (two) times daily.   traZODone 100 MG tablet Commonly known as:  DESYREL Take 4 tablets (400 mg total) by mouth at bedtime.   warfarin 2 MG tablet Commonly known as:  COUMADIN Take 1-1.5 tablets (2-3 mg total) by mouth See admin instructions. Take 2 mg on Tuesday, Thursday, Saturday and Sunday then take 3 mg on Monday, Wednesday and Friday Start taking on:  03/22/2016 What changed:  when to take this  additional instructions            Durable Medical Equipment        Start     Ordered   03/21/16 1120  For home use only DME oxygen  Once    Question Answer Comment  Mode or (Route) Nasal cannula   Liters per Minute 2   Frequency Continuous (stationary and portable oxygen unit needed)   Oxygen conserving device No   Oxygen delivery system Gas      03 /16/18 1119   03/21/16 1118  For home use only DME Nebulizer machine  Once  Question:  Patient needs a nebulizer to treat with the following condition  Answer:  SOB (shortness of breath)   03/21/16 1117     Follow-up Information    [facility] Follow up.   Why:  Registered Nurse.  Contact information: [address] [place] [address] [phone]        Glori Bickers, MD Follow up on 04/01/2016.   Specialty:  Cardiology Why:  1:30  Contact information: [address] [place] [address] [phone]        [name], MD. Schedule an appointment as soon as possible for a visit in 1 week(s).   Specialty:  Internal Medicine Contact information: [address] [place] [address] [phone]          No Known Allergies  Consultations:   cardiology and Palliative care   Other Procedures/Studies: Dg Chest 2 View  Result  Date: 03/15/2016 CLINICAL DATA:  Cough and rhonchi EXAM: CHEST  2 VIEW COMPARISON:  05/09/2015 FINDINGS: Moderate cardiomegaly.  The transvenous cardiac leads appear intact. Moderate vascular and interstitial prominence. Hazy central lung opacities bilaterally. Small right pleural effusion. IMPRESSION: The findings likely represent congestive heart failure with interstitial and alveolar edema, as well a small right pleural effusion. Electronically Signed   By: Andreas Newport M.D.   On: 03/15/2016 22:21    TODAY-DAY OF DISCHARGE:  Subjective:   Jon Butler today has no headache,no chest abdominal pain,no new weakness tingling or numbness, feels much better wants to go home today.   Objective:   Blood pressure (!) 87/65, pulse (!) 56, temperature 98.2 F (36.8 C), temperature source Oral, resp. rate 14, height 5\' 9"  (1.753 m), weight 73.1 kg (161 lb 3.2 oz), SpO2 92 %.  Intake/Output Summary (Last 24 hours) at 03/21/16 1125 Last data filed at 03/21/16 0839  Gross per 24 hour  Intake              960 ml  Output             2250 ml  Net            -1290 ml   Filed Weights   03/19/16 0417 03/20/16 0452 03/21/16 0426  Weight: 78.2 kg (172 lb 4.8 oz) 74.7 kg (164 lb 9.6 oz) 73.1 kg (161 lb 3.2 oz)    Exam: Awake Alert, Oriented *3, No new F.N deficit [place].AT,PERRAL Supple Neck,No JVD, No cervical lymphadenopathy appriciated.  Symmetrical Chest wall movement, Good air movement bilaterally RRR,No Gallops,Rubs or new Murmurs, No Parasternal Heave +ve B.Sounds, Abd Soft, Non tender, No organomegaly appriciated, No rebound -guarding or rigidity. No Cyanosis, Clubbing or edema, No new Rash or bruise   PERTINENT RADIOLOGIC STUDIES: Dg Chest 2 View  Result Date: 03/15/2016 CLINICAL DATA:  Cough and rhonchi EXAM: CHEST  2 VIEW COMPARISON:  05/09/2015 FINDINGS: Moderate cardiomegaly.  The transvenous cardiac leads appear intact. Moderate vascular and interstitial prominence. Hazy central  lung opacities bilaterally. Small right pleural effusion. IMPRESSION: The findings likely represent congestive heart failure with interstitial and alveolar edema, as well a small right pleural effusion. Electronically Signed   By: Andreas Newport M.D.   On: 03/15/2016 22:21     PERTINENT LAB RESULTS: CBC:  Recent Labs  03/19/16 0413 03/20/16 0251  WBC 6.0 6.0  HGB 12.3* 12.4*  HCT 38.5* 38.0*  PLT 118* 118*   CMET CMP     Component Value Date/Time   NA 136 03/21/2016  0507   K 3.5 03/21/2016 0507   CL 95 (L) 03/21/2016 0507   CO2 34 (H) 03/21/2016 0507   GLUCOSE 84 03/21/2016 0507   BUN 19 03/21/2016 0507   CREATININE 0.86 03/21/2016 0507   CALCIUM 8.4 (L) 03/21/2016 0507   PROT 6.3 (L) 03/15/2016 2136   ALBUMIN 3.5 03/15/2016 2136   AST 20 03/15/2016 2136   ALT 15 (L) 03/15/2016 2136   ALKPHOS 93 03/15/2016 2136   BILITOT 1.3 (H) 03/15/2016 2136   GFRNONAA >60 03/21/2016 0507   GFRAA >60 03/21/2016 0507    GFR Estimated Creatinine Clearance: 70.8 mL/min (by C-G formula based on SCr of 0.86 mg/dL). No results for input(s): LIPASE, AMYLASE in the last 72 hours. No results for input(s): CKTOTAL, CKMB, CKMBINDEX, TROPONINI in the last 72 hours. Invalid input(s): POCBNP No results for input(s): DDIMER in the last 72 hours. No results for input(s): HGBA1C in the last 72 hours. No results for input(s): CHOL, HDL, LDLCALC, TRIG, CHOLHDL, LDLDIRECT in the last 72 hours. No results for input(s): TSH, T4TOTAL, T3FREE, THYROIDAB in the last 72 hours.  Invalid input(s): FREET3 No results for input(s): VITAMINB12, FOLATE, FERRITIN, TIBC, IRON, RETICCTPCT in the last 72 hours. Coags:  Recent Labs  03/20/16 0251 03/21/16 0507  INR 2.59 2.27   Microbiology: Recent Results (from the past 240 hour(s))  Culture, blood (Routine x 2)     Status: None   Collection Time: 03/15/16  9:36 PM  Result Value Ref Range Status   Specimen Description BLOOD RIGHT ANTECUBITAL  Final    Special Requests BOTTLES DRAWN AEROBIC AND ANAEROBIC 5CC EA  Final   Culture NO GROWTH 5 DAYS  Final   Report Status 03/20/2016 FINAL  Final  Culture, blood (Routine x 2)     Status: None   Collection Time: 03/15/16 10:20 PM  Result Value Ref Range Status   Specimen Description BLOOD LEFT ANTECUBITAL  Final   Special Requests BOTTLES DRAWN AEROBIC AND ANAEROBIC 5CC EA  Final   Culture NO GROWTH 5 DAYS  Final   Report Status 03/20/2016 FINAL  Final    FURTHER DISCHARGE INSTRUCTIONS:  Get Medicines reviewed and adjusted: Please take all your medications with you for your next visit with your Primary MD  Laboratory/radiological data: Please request your Primary MD to go over all hospital tests and procedure/radiological results at the follow up, please ask your Primary MD to get all Hospital records sent to his/her office.  In some cases, they will be blood work, cultures and biopsy results pending at the time of your discharge. Please request that your primary care M.D. goes through all the records of your hospital data and follows up on these results.  Also Note the following: If you experience worsening of your admission symptoms, develop shortness of breath, life threatening emergency, suicidal or homicidal thoughts you must seek medical attention immediately by calling 911 or calling your MD immediately  if symptoms less severe.  You must read complete instructions/literature along with all the possible adverse reactions/side effects for all the Medicines you take and that have been prescribed to you. Take any new Medicines after you have completely understood and accpet all the possible adverse reactions/side effects.   Do not drive when taking Pain medications or sleeping medications (Benzodaizepines)  Do not take more than prescribed Pain, Sleep and Anxiety Medications. It is not advisable to combine anxiety,sleep and pain medications without talking with your primary care  practitioner  Special Instructions: If  you have smoked or chewed Tobacco  in the last 2 yrs please stop smoking, stop any regular Alcohol  and or any Recreational drug use.  Wear Seat belts while driving.  Please note: You were cared for by a hospitalist during your hospital stay. Once you are discharged, your primary care physician will handle any further medical issues. Please note that NO REFILLS for any discharge medications will be authorized once you are discharged, as it is imperative that you return to your primary care physician (or establish a relationship with a primary care physician if you do not have one) for your post hospital discharge needs so that they can reassess your need for medications and monitor your lab values.  Total Time spent coordinating discharge including counseling, education and face to face time equals 45 minutes.  SignedOren Binet 03/21/2016 11:25 AM

## 2016-03-21 NOTE — Progress Notes (Signed)
ANTICOAGULATION CONSULT NOTE - Follow Up Consult  Pharmacy Consult for Coumadin Indication: atrial fibrillation  No Known Allergies  Patient Measurements: Height: 5\' 9"  (175.3 cm) Weight: 161 lb 3.2 oz (73.1 kg) IBW/kg (Calculated) : 70.7  Vital Signs: Temp: 98.2 F (36.8 C) (03/16 0838) Temp Source: Oral (03/16 0838) BP: 87/65 (03/16 0838) Pulse Rate: 56 (03/16 0838)  Labs:  Recent Labs  03/19/16 0413 03/20/16 0251 03/21/16 0507  HGB 12.3* 12.4*  --   HCT 38.5* 38.0*  --   PLT 118* 118*  --   LABPROT 29.6* 28.3* 25.4*  INR 2.74 2.59 2.27  CREATININE 0.90 1.00 0.86    Estimated Creatinine Clearance: 70.8 mL/min (by C-G formula based on SCr of 0.86 mg/dL).  Assessment: 80yom on coumadin for afib. INR 2.58 on admit and he received a dose. INR then trended up to 3.7, doses held 3/12 and 3/13, resumed 3/14. Today's INR continues to trend down to 2.27. Jump in INR could have been due to drug interaction with levaquin (3/11 > 3/15) or increased amiodarone dose (200mg  daily pta, 400mg  daily here). No CBC. No bleeding.  Home dose: 2mg  daily except 3mg  MWF - last taken 3/10  Goal of Therapy:  INR 2-3  Monitor platelets by anticoagulatino protocol: Yes   Plan:  1) Coumadin 4mg  x 1  2) Daily INR  **If he discharges today, would recommend resuming home dose tomorrow after the 4mg  today**  Nena Jordan, PharmD, BCPS 03/21/2016 10:58 AM

## 2016-03-21 NOTE — Progress Notes (Signed)
Advanced Heart Failure Rounding Note  PCP: Dr. Lysle Rubens Primary Cardiologist: Dr. Haroldine Laws   Subjective:    81 yo male with a history of chronic systolic HF EF 32% due to NICM s/p CRT-D (Medtronic), mechanical AVR (St Jude), chronic coumadin therapy, LBBB, PAF,  ETOH abuse, dementia and h/o non-compliance.  Presented to ED 03/15/16 with hypoxia in the setting of recent URI and found to be in acute on chronic systolic CHF  Started on milrinone on 03/16/16. Pressures soft so switched to dobutamine. Family does not want inotropes at home, so no PICC line placed.   ICD interrogation on admit.  2.5% AT/AF burden. No VT/VF noted, but VT/VF detection off.  BiV pacing 92% of the time.  Fluid index below threshold.   Yesterday dobutamine stopped. Diuresed with IV lasix. Weight down 164 -> 161.   Feeling better. Sitting in chair. Cough much improved. Denies SOB. Feels stronger. Wants to go home. Wife says Hospice has delivered hospital bed.     Objective:   Weight Range: 161 lb 3.2 oz (73.1 kg) Body mass index is 23.81 kg/m.   Vital Signs:   Temp:  [98 F (36.7 C)-98.2 F (36.8 C)] 98.1 F (36.7 C) (03/16 0426) Pulse Rate:  [52-62] 52 (03/16 0426) Resp:  [13-14] 14 (03/16 0426) BP: (81-99)/(53-68) 81/68 (03/16 0426) SpO2:  [95 %-100 %] 95 % (03/16 0426) Weight:  [161 lb 3.2 oz (73.1 kg)] 161 lb 3.2 oz (73.1 kg) (03/16 0426) Last BM Date: 03/20/16  Weight change: Filed Weights   03/19/16 0417 03/20/16 0452 03/21/16 0426  Weight: 172 lb 4.8 oz (78.2 kg) 164 lb 9.6 oz (74.7 kg) 161 lb 3.2 oz (73.1 kg)    Intake/Output:   Intake/Output Summary (Last 24 hours) at 03/21/16 0729 Last data filed at 03/21/16 0427  Gross per 24 hour  Intake            980.3 ml  Output             2250 ml  Net          -1269.7 ml     Physical Exam: General: NAD. In chair eating lunch Wife at bedside HEENT: normal Neck: supple. JVP 7 Carotids +2 bilat; no bruits. No thyromegaly or nodule noted.    Cor: Irregular rhythm. No murmur.Mechanical S2 Lungs: Decreased RML On 2 liters.. No wheeze.Minimal crackles Abdomen: soft, NT, ND, no HSM. No bruits or masses. +BS  Extremities: Warm. no cyanosis, clubbing, rash. R and LLE trace edema. Ted hose in place. .  Neuro: alert & oriented to person and place, cranial nerves grossly intact. moves all 4 extremities w/o difficulty. Affect flat. Weak  Telemetry: Personally reviewed, Mostly NSR  BiV pacing, frequent PVC's 80s   Labs: CBC  Recent Labs  03/19/16 0413 03/20/16 0251  WBC 6.0 6.0  HGB 12.3* 12.4*  HCT 38.5* 38.0*  MCV 94.1 95.0  PLT 118* 951*   Basic Metabolic Panel  Recent Labs  03/20/16 0251 03/21/16 0507  NA 136 136  K 3.8 3.5  CL 96* 95*  CO2 34* 34*  GLUCOSE 90 84  BUN 17 19  CREATININE 1.00 0.86  CALCIUM 8.3* 8.4*   Liver Function Tests No results for input(s): AST, ALT, ALKPHOS, BILITOT, PROT, ALBUMIN in the last 72 hours. Cardiac Enzymes No results for input(s): CKTOTAL, CKMB, CKMBINDEX, TROPONINI in the last 72 hours.  BNP: BNP (last 3 results)  Recent Labs  03/15/16 2152  BNP >4,500.0*  Imaging/Studies:  Transthoracic Echocardiography 03/17/16 Study Conclusions  - Left ventricle: The cavity size was severely dilated. Wall   thickness was normal. Systolic function was severely reduced. The   estimated ejection fraction was in the range of 20% to 25%.   Diffuse hypokinesis. Doppler parameters are consistent with a   reversible restrictive pattern, indicative of decreased left   ventricular diastolic compliance and/or increased left atrial   pressure (grade 3 diastolic dysfunction). - Aortic valve: A mechanical prosthesis was present and functioning   normally. Mean gradient (S): 7 mm Hg. Valve area (VTI): 2.46   cm^2. Valve area (Vmax): 2.33 cm^2. Valve area (Vmean): 2.27   cm^2. - Mitral valve: There was moderate to severe regurgitation. - Left atrium: The atrium was severely  dilated. - Right ventricle: The cavity size was mildly dilated. Wall   thickness was normal. Pacer wire or catheter noted in right   ventricle.  Impressions:  - When compared to prior, mitral regurgitation has increased.    Medications:     Scheduled Medications: . allopurinol  300 mg Oral Daily  . amiodarone  400 mg Oral Daily  . aspirin EC  81 mg Oral Daily  . donepezil  23 mg Oral QHS  . furosemide  80 mg Oral Daily  . guaiFENesin  1,200 mg Oral BID  . LORazepam  0.5 mg Oral QHS  . memantine  10 mg Oral BID  . rosuvastatin  5 mg Oral q1800  . senna-docusate  2 tablet Oral QHS  . sodium chloride flush  3 mL Intravenous Q12H  . spironolactone  12.5 mg Oral Daily  . thiamine  100 mg Oral BID  . traZODone  400 mg Oral QHS  . Warfarin - Pharmacist Dosing Inpatient   Does not apply q1800    Infusions:   PRN Medications: sodium chloride, acetaminophen, diclofenac sodium, fluticasone, ipratropium-albuterol, ondansetron (ZOFRAN) IV, sodium chloride flush   Assessment/Plan   1. Acute on chronic systolic HF with probable low output   NICM EF 20% 2. PAF 3. Acute respiratory failure with RLL PNA  with hypoxia  4. Mechanical AVR 5. Hypokalemia 6. DNR 7. NSVT/frequent PVCs 8. Medtronic ICD- Deactivated.    Volume status improved. Start lasix 80 mg daily. Weight down another 3 pounds. Overall down 9 pounds. Renal function stable.   Remain on antibiotics for CAP. Levofloxacin. Remains on 2 liters oxygen. Will need oxygen when discharged.   ICD has been turned off.  Family met with Palliative Care. Plans for [facility].  Can follow up in the HF clinic if he is able. I asked his wife to call HF clinic for questions about diuretics.   Length of Stay: 5  Amy Clegg, NP  03/21/2016, 7:29 AM  Advanced Heart Failure Team Pager [phone] (M-F; 7a - 4p)  Please contact [facility] for night-coverage after hours (4p -7a ) and weekends on  [email or web address]  Patient seen and examined with Darrick Grinder, NP. We discussed all aspects of the encounter. I agree with the assessment and plan as stated above.   He is improved today. PNA clearing. Volume status also improved. Still with PVCs and brief NSVT but rhythm has become much more stable as PNA has cleared. He remains weak and family is happy to have Hospice support.   Can d/c today. Would stop carvedilol on d/c. Now off Entresto as well. Would consider restarting Entresto if stable on f/u.   Bensimhon, Daniel,MD 9:19 PM

## 2016-03-24 ENCOUNTER — Telehealth (HOSPITAL_COMMUNITY): Payer: Self-pay | Admitting: *Deleted

## 2016-03-24 NOTE — Telephone Encounter (Signed)
I called Manus Gunning back this morning and let her know that Dr. Haroldine Laws agree's to be patient's primary ordering physician for his hospice care. No further questions at this time

## 2016-03-24 NOTE — Telephone Encounter (Signed)
-----   Message from Jolaine Artist, MD sent at 03/21/2016  8:24 PM EDT ----- Regarding: RE: Hospice care That's fine. thanks  ----- Message ----- From: Kennieth Rad, RN Sent: 03/20/2016  12:30 PM To: Jolaine Artist, MD Subject: Hospice care                                   Manus Gunning with [facility] called saying patient has been discharged home with hospice and patient is requesting for you to be his primary ordering physician for his hospice care.  They need to know if that is ok?

## 2016-03-25 NOTE — Progress Notes (Signed)
Patient discharged from hospital with hospice.  Disenrolled from monthly ICM clinic and follow up calls.

## 2016-03-28 ENCOUNTER — Ambulatory Visit (INDEPENDENT_AMBULATORY_CARE_PROVIDER_SITE_OTHER): Payer: Medicare Other | Admitting: Cardiovascular Disease

## 2016-03-28 DIAGNOSIS — Z5181 Encounter for therapeutic drug level monitoring: Secondary | ICD-10-CM

## 2016-03-28 DIAGNOSIS — Z95810 Presence of automatic (implantable) cardiac defibrillator: Secondary | ICD-10-CM

## 2016-03-28 DIAGNOSIS — I480 Paroxysmal atrial fibrillation: Secondary | ICD-10-CM

## 2016-03-28 LAB — PROTIME-INR: INR: 4.1 — AB (ref 0.9–1.1)

## 2016-03-31 ENCOUNTER — Telehealth (HOSPITAL_COMMUNITY): Payer: Self-pay | Admitting: Vascular Surgery

## 2016-03-31 NOTE — Telephone Encounter (Signed)
pt wife called to cancel pt appt for 04/01/16 , she states she just cant get him in her, she did not want to reschedule.. Please advise

## 2016-04-01 ENCOUNTER — Inpatient Hospital Stay (HOSPITAL_COMMUNITY): Payer: Medicare Other

## 2016-04-01 NOTE — Telephone Encounter (Signed)
Pt is now Hospice and they will be following him, will let us know if needs to come back here

## 2016-04-04 LAB — POCT INR: INR: 3.6

## 2016-04-05 ENCOUNTER — Telehealth: Payer: Self-pay | Admitting: Physician Assistant

## 2016-04-05 NOTE — Telephone Encounter (Signed)
We have been using INR 2-3 for him

## 2016-04-05 NOTE — Telephone Encounter (Signed)
Received telephone call from [facility] regarding INR check. They are looking for further direction of dosing.  To recap, INR on 03/28/16 was 4.1 - at that time patient was instructed to hold Coumadin that day and continue prior home dose of 2mg  daily except 3mg  MWF.   Per Marcelino Scot, INR yesterday (04/04/16) was 3.6. No interim med changes since last week. No complications reported. Prior INRs on this dose seem somewhat labile, down to 2.2 at times, but more recently hanging around 2.7 prior to recent check last week. Goal INR not totally clear - listed in chart as 2-3. However, with mechanical AVR plus additional indication for anticoagulation (atrial fib), this is usually 2.5-3.5. EtOH and dementia noted in chart as well so may be reasonable to aim for a goal around 3.  I discussed the case with inpatient pharmacist at [facility]. She suspects his overall weekly dose may need to be reduced by changing two of the 3mg  doses to 2mg  doses. She would not recommend completely holding the dose today. Per our discussion, have recommended the patient take 2mg  of Coumadin today and tomorrow along with eating some leafy greens, and recheck INR on Monday prior to additional dose (which may likely need to be 2mg  instead of 3mg ). Marcelino Scot verbalized understanding of these recommendations and will convey to the patient.  Will forward to Coumadin clinic to make them aware. Will also forward to Dr. Haroldine Laws for clarification on INR goal.  Melina Copa PA-C

## 2016-04-07 ENCOUNTER — Ambulatory Visit (INDEPENDENT_AMBULATORY_CARE_PROVIDER_SITE_OTHER): Payer: Medicare Other | Admitting: Cardiology

## 2016-04-07 ENCOUNTER — Telehealth: Payer: Self-pay | Admitting: Internal Medicine

## 2016-04-07 DIAGNOSIS — I480 Paroxysmal atrial fibrillation: Secondary | ICD-10-CM

## 2016-04-07 DIAGNOSIS — Z5181 Encounter for therapeutic drug level monitoring: Secondary | ICD-10-CM

## 2016-04-07 DIAGNOSIS — Z95810 Presence of automatic (implantable) cardiac defibrillator: Secondary | ICD-10-CM

## 2016-04-07 NOTE — Telephone Encounter (Signed)
Spoke with Selman with [facility].  She is calling to clarify instructions on when pt needs his next INR and what his current dosage should be.  Advised I will forward this request the the Coumadin Clinic since there appears to a discussion on what the INR goal should be.  Stephanie requests she be called back at [phone].

## 2016-04-07 NOTE — Telephone Encounter (Signed)
Noted. INR goal changed and dose adjusted accordingly.

## 2016-04-07 NOTE — Telephone Encounter (Signed)
It appears on hospital admission 10/15/2012 pt on warfarin PTA INR goal 2-3 depsite noted AVR and afib - per [facility] note (our clinic was not managing prior to this admission). It appears this goal was to continue. Please advise if goal change indicated. Thank you!

## 2016-04-07 NOTE — Telephone Encounter (Signed)
Colletta Maryland calling with [facility] to verify order for patient.

## 2016-04-07 NOTE — Telephone Encounter (Signed)
We spoke to Jon Butler at [facility] and clarified orders.

## 2016-04-07 NOTE — Telephone Encounter (Signed)
With AVR and additional TE risk factor of afib, his goal should be 2.5-3.5. Looking back through old notes, in 2014, his goal was noted to be lower at 2-3 but could not find any explanation as to why it was lower.   Ruta Hinds. Velva Harman, PharmD, BCPS, CPP Clinical Pharmacist Pager: [phone] Phone: [phone] 04/07/2016 9:56 AM

## 2016-04-11 ENCOUNTER — Telehealth (HOSPITAL_COMMUNITY): Payer: Self-pay | Admitting: *Deleted

## 2016-04-11 NOTE — Telephone Encounter (Signed)
MD with [facility] called to report that patient's weight is up 4 lbs and she had double his lasix dose for that.  She was mostly concerned about patient's heart rate is in the 120's with BP at 90/60.  She wanted to ask Dr. Haroldine Laws his recommendations to lower his heart rate.   Dr. Haroldine Laws advised that the only thing we could do would be to double his amiodarone dose for about a week and see if that lowers his heart rate.  She understands will have patient double his dose.  No further questions at this time.

## 2016-04-14 ENCOUNTER — Ambulatory Visit (INDEPENDENT_AMBULATORY_CARE_PROVIDER_SITE_OTHER): Payer: Self-pay | Admitting: Internal Medicine

## 2016-04-14 DIAGNOSIS — Z5181 Encounter for therapeutic drug level monitoring: Secondary | ICD-10-CM

## 2016-04-14 DIAGNOSIS — I480 Paroxysmal atrial fibrillation: Secondary | ICD-10-CM

## 2016-04-14 DIAGNOSIS — Z95810 Presence of automatic (implantable) cardiac defibrillator: Secondary | ICD-10-CM

## 2016-04-14 LAB — PROTIME-INR: INR: 3.9 — AB (ref 0.9–1.1)

## 2016-04-21 ENCOUNTER — Ambulatory Visit (INDEPENDENT_AMBULATORY_CARE_PROVIDER_SITE_OTHER): Payer: Medicare Other

## 2016-04-21 DIAGNOSIS — Z5181 Encounter for therapeutic drug level monitoring: Secondary | ICD-10-CM

## 2016-04-21 DIAGNOSIS — I480 Paroxysmal atrial fibrillation: Secondary | ICD-10-CM

## 2016-04-21 DIAGNOSIS — Z95810 Presence of automatic (implantable) cardiac defibrillator: Secondary | ICD-10-CM

## 2016-04-21 LAB — PROTIME-INR: INR: 2.4 — AB (ref 0.9–1.1)

## 2016-05-01 ENCOUNTER — Ambulatory Visit (INDEPENDENT_AMBULATORY_CARE_PROVIDER_SITE_OTHER): Payer: Medicare Other | Admitting: Cardiovascular Disease

## 2016-05-01 DIAGNOSIS — Z95810 Presence of automatic (implantable) cardiac defibrillator: Secondary | ICD-10-CM

## 2016-05-01 DIAGNOSIS — Z5181 Encounter for therapeutic drug level monitoring: Secondary | ICD-10-CM

## 2016-05-01 DIAGNOSIS — I480 Paroxysmal atrial fibrillation: Secondary | ICD-10-CM

## 2016-05-01 LAB — PROTIME-INR: INR: 4.4 — AB (ref 0.9–1.1)

## 2016-05-08 ENCOUNTER — Ambulatory Visit (INDEPENDENT_AMBULATORY_CARE_PROVIDER_SITE_OTHER): Payer: Medicare Other | Admitting: Cardiology

## 2016-05-08 DIAGNOSIS — Z95810 Presence of automatic (implantable) cardiac defibrillator: Secondary | ICD-10-CM

## 2016-05-08 DIAGNOSIS — Z5181 Encounter for therapeutic drug level monitoring: Secondary | ICD-10-CM

## 2016-05-08 DIAGNOSIS — I480 Paroxysmal atrial fibrillation: Secondary | ICD-10-CM

## 2016-05-08 LAB — PROTIME-INR: INR: 6.3 — AB (ref 0.9–1.1)

## 2016-05-12 ENCOUNTER — Telehealth (HOSPITAL_COMMUNITY): Payer: Self-pay | Admitting: *Deleted

## 2016-05-12 NOTE — Telephone Encounter (Signed)
Stephanie with Hosp[facility] called stating patient was refusing to take his coumadin. She said patient only opens his eyes for a few minutes a day/ not getting out of bed.  Per Dr.Bensimhon if patient is end stage hospice he may stop coumadin. Colletta Maryland feels patient is at the point where it is ok to stop warfarin.

## 2016-05-15 ENCOUNTER — Ambulatory Visit (INDEPENDENT_AMBULATORY_CARE_PROVIDER_SITE_OTHER): Payer: Medicare Other | Admitting: Internal Medicine

## 2016-05-15 DIAGNOSIS — Z95810 Presence of automatic (implantable) cardiac defibrillator: Secondary | ICD-10-CM

## 2016-05-15 DIAGNOSIS — I480 Paroxysmal atrial fibrillation: Secondary | ICD-10-CM

## 2016-05-15 DIAGNOSIS — Z5181 Encounter for therapeutic drug level monitoring: Secondary | ICD-10-CM

## 2016-06-04 ENCOUNTER — Encounter (HOSPITAL_COMMUNITY): Payer: Medicare Other | Admitting: Internal Medicine

## 2016-06-06 DEATH — deceased
